# Patient Record
Sex: Male | Born: 1985 | Race: White | Hispanic: No | Marital: Single | Smoking: Former smoker
Health system: Southern US, Community
[De-identification: ages and names within clinical notes are randomized; demographics above are authoritative.]

## PROBLEM LIST (undated history)

## (undated) DIAGNOSIS — J45909 Unspecified asthma, uncomplicated: Secondary | ICD-10-CM

## (undated) DIAGNOSIS — F32A Depression, unspecified: Secondary | ICD-10-CM

## (undated) DIAGNOSIS — F319 Bipolar disorder, unspecified: Secondary | ICD-10-CM

## (undated) DIAGNOSIS — F419 Anxiety disorder, unspecified: Secondary | ICD-10-CM

---

## 2018-10-23 ENCOUNTER — Other Ambulatory Visit: Payer: Self-pay

## 2018-10-23 DIAGNOSIS — Z20822 Contact with and (suspected) exposure to covid-19: Secondary | ICD-10-CM

## 2018-10-24 LAB — NOVEL CORONAVIRUS, NAA: SARS-CoV-2, NAA: NOT DETECTED

## 2019-10-05 ENCOUNTER — Emergency Department (HOSPITAL_COMMUNITY)
Admission: EM | Admit: 2019-10-05 | Discharge: 2019-10-06 | Disposition: A | Discharge status: Home / Self Care | Payer: Self-pay | Attending: Emergency Medicine | Admitting: Emergency Medicine

## 2019-10-05 ENCOUNTER — Other Ambulatory Visit: Payer: Self-pay

## 2019-10-05 DIAGNOSIS — R000 Tachycardia, unspecified: Secondary | ICD-10-CM | POA: Insufficient documentation

## 2019-10-05 DIAGNOSIS — F1510 Other stimulant abuse, uncomplicated: Secondary | ICD-10-CM | POA: Insufficient documentation

## 2019-10-05 DIAGNOSIS — F19988 Other psychoactive substance use, unspecified with other psychoactive substance-induced disorder: Secondary | ICD-10-CM

## 2019-10-05 LAB — CBC WITH DIFFERENTIAL/PLATELET
Abs Immature Granulocytes: 0.11 10*3/uL — ABNORMAL HIGH (ref 0.00–0.07)
Basophils Absolute: 0 10*3/uL (ref 0.0–0.1)
Basophils Relative: 0 %
Eosinophils Absolute: 0 10*3/uL (ref 0.0–0.5)
Eosinophils Relative: 0 %
HCT: 36.4 % — ABNORMAL LOW (ref 39.0–52.0)
Hemoglobin: 12.7 g/dL — ABNORMAL LOW (ref 13.0–17.0)
Immature Granulocytes: 1 %
Lymphocytes Relative: 6 %
Lymphs Abs: 1.1 10*3/uL (ref 0.7–4.0)
MCH: 30.2 pg (ref 26.0–34.0)
MCHC: 34.9 g/dL (ref 30.0–36.0)
MCV: 86.5 fL (ref 80.0–100.0)
Monocytes Absolute: 1.3 10*3/uL — ABNORMAL HIGH (ref 0.1–1.0)
Monocytes Relative: 8 %
Neutro Abs: 14.5 10*3/uL — ABNORMAL HIGH (ref 1.7–7.7)
Neutrophils Relative %: 85 %
Platelets: 269 10*3/uL (ref 150–400)
RBC: 4.21 MIL/uL — ABNORMAL LOW (ref 4.22–5.81)
RDW: 12.5 % (ref 11.5–15.5)
WBC: 17.1 10*3/uL — ABNORMAL HIGH (ref 4.0–10.5)
nRBC: 0 % (ref 0.0–0.2)

## 2019-10-05 LAB — URINALYSIS, ROUTINE W REFLEX MICROSCOPIC
Bilirubin Urine: NEGATIVE
Glucose, UA: NEGATIVE mg/dL
Hgb urine dipstick: NEGATIVE
Ketones, ur: 80 mg/dL — AB
Leukocytes,Ua: NEGATIVE
Nitrite: NEGATIVE
Protein, ur: NEGATIVE mg/dL
Specific Gravity, Urine: 1.009 (ref 1.005–1.030)
pH: 6 (ref 5.0–8.0)

## 2019-10-05 LAB — COMPREHENSIVE METABOLIC PANEL
ALT: 29 U/L (ref 0–44)
AST: 25 U/L (ref 15–41)
Albumin: 4.2 g/dL (ref 3.5–5.0)
Alkaline Phosphatase: 66 U/L (ref 38–126)
Anion gap: 11 (ref 5–15)
BUN: 8 mg/dL (ref 6–20)
CO2: 24 mmol/L (ref 22–32)
Calcium: 8.4 mg/dL — ABNORMAL LOW (ref 8.9–10.3)
Chloride: 102 mmol/L (ref 98–111)
Creatinine, Ser: 0.73 mg/dL (ref 0.61–1.24)
GFR calc Af Amer: >60 mL/min (ref >60)
GFR calc non Af Amer: >60 mL/min (ref >60)
Glucose, Bld: 103 mg/dL — ABNORMAL HIGH (ref 70–99)
Potassium: 3.7 mmol/L (ref 3.5–5.1)
Sodium: 137 mmol/L (ref 135–145)
Total Bilirubin: 1.4 mg/dL — ABNORMAL HIGH (ref 0.3–1.2)
Total Protein: 7 g/dL (ref 6.5–8.1)

## 2019-10-05 LAB — RAPID URINE DRUG SCREEN, HOSP PERFORMED
Amphetamines: POSITIVE — AB
Barbiturates: NOT DETECTED
Benzodiazepines: NOT DETECTED
Cocaine: NOT DETECTED
Opiates: NOT DETECTED
Tetrahydrocannabinol: NOT DETECTED

## 2019-10-05 LAB — ACETAMINOPHEN LEVEL: Acetaminophen (Tylenol), Serum: <10 ug/mL — ABNORMAL LOW (ref 10–30)

## 2019-10-05 LAB — SALICYLATE LEVEL: Salicylate Lvl: <7 mg/dL — ABNORMAL LOW (ref 7.0–30.0)

## 2019-10-05 LAB — CBG MONITORING, ED: Glucose-Capillary: 100 mg/dL — ABNORMAL HIGH (ref 70–99)

## 2019-10-05 LAB — ETHANOL: Alcohol, Ethyl (B): <10 mg/dL (ref <10)

## 2019-10-05 MED ORDER — SODIUM CHLORIDE 0.9 % IV BOLUS
1000.0000 mL | Freq: Once | INTRAVENOUS | Status: AC
  Administered 2019-10-05: 1000 mL via INTRAVENOUS

## 2019-10-05 MED ORDER — ALBUTEROL SULFATE HFA 108 (90 BASE) MCG/ACT IN AERS
2.0000 | INHALATION_SPRAY | Freq: Once | RESPIRATORY_TRACT | Status: AC
  Administered 2019-10-05: 2 via RESPIRATORY_TRACT
  Filled 2019-10-05: qty 6.7

## 2019-10-05 MED ORDER — SODIUM CHLORIDE 0.9 % IV SOLN: INTRAVENOUS | Status: DC

## 2019-10-05 MED ORDER — AEROCHAMBER PLUS FLO-VU MEDIUM MISC
1.0000 | Freq: Once | Status: AC
  Administered 2019-10-05: 1
  Filled 2019-10-05: qty 1

## 2019-10-05 NOTE — ED Provider Notes (Signed)
[facility]-EMERGENCY DEPT Provider Note   CSN: [id number] Arrival date & time: 10/05/19  1156     History Chief Complaint  Patient presents with  . Drug Overdose    Brett House is a 34 y.o. male.  Pt presents to the ED today with a drug overdose.  Pt unable to give any hx.  Per EMS, pt had a 5 day binge of meth.  It is unclear if he called EMS or if someone else did.  He was apparently very agitated and erratic.  EMS had to given him 5 mg Haldol IM for transport.  He is asleep now and is unable to give any hx.        No past medical history on file.  There are no problems to display for this patient.    No family history on file.  Social History   Tobacco Use  . Smoking status: Not on file  Substance Use Topics  . Alcohol use: Not on file  . Drug use: Not on file   + tob/+meth  Home Medications Prior to Admission medications   Not on File    Allergies    Patient has no allergy information on record.  Review of Systems   Review of Systems  Unable to perform ROS: Mental status change    Physical Exam Updated Vital Signs BP (!) 147/82   Pulse 80   Temp 97.7 F (36.5 C)   Resp 18   SpO2 97%   Physical Exam Vitals and nursing note reviewed.  Constitutional:      Appearance: Normal appearance.  HENT:     Head: Normocephalic and atraumatic.     Right Ear: External ear normal.     Left Ear: External ear normal.     Nose: Nose normal.     Mouth/Throat:     Mouth: Mucous membranes are dry.  Eyes:     Extraocular Movements: Extraocular movements intact.     Conjunctiva/sclera: Conjunctivae normal.     Pupils: Pupils are equal, round, and reactive to light.  Cardiovascular:     Rate and Rhythm: Regular rhythm. Tachycardia present.     Pulses: Normal pulses.     Heart sounds: Normal heart sounds.  Pulmonary:     Effort: Pulmonary effort is normal.     Breath sounds: Normal breath sounds.  Abdominal:     General: Abdomen is  flat. Bowel sounds are normal.     Palpations: Abdomen is soft.  Musculoskeletal:        General: Normal range of motion.     Cervical back: Normal range of motion and neck supple.  Skin:    General: Skin is warm.     Capillary Refill: Capillary refill takes less than 2 seconds.  Neurological:     Mental Status: He is lethargic.  Psychiatric:     Comments: Pt is asleep now s/p Haldol.     ED Results / Procedures / Treatments   Labs (all labs ordered are listed, but only abnormal results are displayed) Labs Reviewed  COMPREHENSIVE METABOLIC PANEL - Abnormal; Notable for the following components:      Result Value   Glucose, Bld 103 (*)    Calcium 8.4 (*)    Total Bilirubin 1.4 (*)    All other components within normal limits  SALICYLATE LEVEL - Abnormal; Notable for the following components:   Salicylate Lvl <7.0 (*)    All other components within normal limits  ACETAMINOPHEN  LEVEL - Abnormal; Notable for the following components:   Acetaminophen (Tylenol), Serum <10 (*)    All other components within normal limits  RAPID URINE DRUG SCREEN, HOSP PERFORMED - Abnormal; Notable for the following components:   Amphetamines POSITIVE (*)    All other components within normal limits  CBC WITH DIFFERENTIAL/PLATELET - Abnormal; Notable for the following components:   WBC 17.1 (*)    RBC 4.21 (*)    Hemoglobin 12.7 (*)    HCT 36.4 (*)    Neutro Abs 14.5 (*)    Monocytes Absolute 1.3 (*)    Abs Immature Granulocytes 0.11 (*)    All other components within normal limits  URINALYSIS, ROUTINE W REFLEX MICROSCOPIC - Abnormal; Notable for the following components:   Ketones, ur 80 (*)    All other components within normal limits  CBG MONITORING, ED - Abnormal; Notable for the following components:   Glucose-Capillary 100 (*)    All other components within normal limits  ETHANOL    EKG EKG Interpretation  Date/Time:  Wednesday October 05 2019 15:50:52 EDT Ventricular Rate:   97 PR Interval:  138 QRS Duration: 84 QT Interval:  364 QTC Calculation: 462 R Axis:   49 Text Interpretation: Normal sinus rhythm Nonspecific ST abnormality Abnormal ECG No old tracing to compare Confirmed by Jacalyn Lefevre [phone]) on 10/05/2019 4:24:13 PM   Radiology No results found.  Procedures Procedures (including critical care time)  Medications Ordered in ED Medications  sodium chloride 0.9 % bolus 1,000 mL (0 mLs Intravenous Stopped 10/05/19 1759)    And  0.9 %  sodium chloride infusion (has no administration in time range)  albuterol (VENTOLIN HFA) 108 (90 Base) MCG/ACT inhaler 2 puff (2 puffs Inhalation Given 10/05/19 2053)  AeroChamber Plus Flo-Vu Medium MISC 1 each (1 each Other Given 10/05/19 2053)    ED Course  I have reviewed the triage vital signs and the nursing notes.  Pertinent labs & imaging results that were available during my care of the patient were reviewed by me and considered in my medical decision making (see chart for details).    MDM Rules/Calculators/A&P                          Pt has been sleeping all shift.  He will wake up briefly, but then will go back to sleep.  He requested an albuterol inhaler, so was given one + spacer in the ED.    When pt wakes up and if he is at his normal mental status, he can go home.   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

## 2019-10-05 NOTE — ED Notes (Signed)
Pt asleep, easily visualized from RN station.

## 2019-10-05 NOTE — ED Triage Notes (Signed)
Pt BIBA from home-  Per EMS- 5 day binge of methamphetamines, denies other drug/ alcohol use.  EMS called out for overdose, found pt highly animated, rapid speech, erratic behavior, paranoid.   EMS Gave 5 mg haldol IM  Initial HR of 160--> 110 CBG 114 Temporal 98.6 140/90

## 2019-10-05 NOTE — ED Notes (Signed)
Call received from pt sister Gearldine Bienenstock [email or web address]@ [phone] requesting rtn call for pt status/updates when possible. [employer]

## 2019-12-21 ENCOUNTER — Encounter (HOSPITAL_COMMUNITY): Payer: Self-pay

## 2019-12-21 ENCOUNTER — Emergency Department (HOSPITAL_COMMUNITY)
Admission: EM | Admit: 2019-12-21 | Discharge: 2019-12-21 | Disposition: A | Discharge status: Home / Self Care | Payer: Self-pay | Attending: Emergency Medicine | Admitting: Emergency Medicine

## 2019-12-21 ENCOUNTER — Other Ambulatory Visit: Payer: Self-pay

## 2019-12-21 DIAGNOSIS — R112 Nausea with vomiting, unspecified: Secondary | ICD-10-CM | POA: Insufficient documentation

## 2019-12-21 LAB — URINALYSIS, ROUTINE W REFLEX MICROSCOPIC
Bacteria, UA: NONE SEEN
Bilirubin Urine: NEGATIVE
Glucose, UA: NEGATIVE mg/dL
Ketones, ur: NEGATIVE mg/dL
Leukocytes,Ua: NEGATIVE
Nitrite: NEGATIVE
Protein, ur: NEGATIVE mg/dL
Specific Gravity, Urine: 1.019 (ref 1.005–1.030)
pH: 5 (ref 5.0–8.0)

## 2019-12-21 LAB — CBC
HCT: 43.6 % (ref 39.0–52.0)
Hemoglobin: 15 g/dL (ref 13.0–17.0)
MCH: 30.2 pg (ref 26.0–34.0)
MCHC: 34.4 g/dL (ref 30.0–36.0)
MCV: 87.9 fL (ref 80.0–100.0)
Platelets: 342 10*3/uL (ref 150–400)
RBC: 4.96 MIL/uL (ref 4.22–5.81)
RDW: 12.8 % (ref 11.5–15.5)
WBC: 9.6 10*3/uL (ref 4.0–10.5)
nRBC: 0 % (ref 0.0–0.2)

## 2019-12-21 LAB — COMPREHENSIVE METABOLIC PANEL
ALT: 24 U/L (ref 0–44)
AST: 20 U/L (ref 15–41)
Albumin: 4.6 g/dL (ref 3.5–5.0)
Alkaline Phosphatase: 54 U/L (ref 38–126)
Anion gap: 15 (ref 5–15)
BUN: 9 mg/dL (ref 6–20)
CO2: 20 mmol/L — ABNORMAL LOW (ref 22–32)
Calcium: 9.1 mg/dL (ref 8.9–10.3)
Chloride: 105 mmol/L (ref 98–111)
Creatinine, Ser: 0.8 mg/dL (ref 0.61–1.24)
GFR, Estimated: >60 mL/min (ref >60)
Glucose, Bld: 120 mg/dL — ABNORMAL HIGH (ref 70–99)
Potassium: 3.5 mmol/L (ref 3.5–5.1)
Sodium: 140 mmol/L (ref 135–145)
Total Bilirubin: 0.6 mg/dL (ref 0.3–1.2)
Total Protein: 7.2 g/dL (ref 6.5–8.1)

## 2019-12-21 LAB — LIPASE, BLOOD: Lipase: 29 U/L (ref 11–51)

## 2019-12-21 MED ORDER — ONDANSETRON 4 MG PO TBDP
ORAL_TABLET | ORAL | 0 refills | Status: DC
Start: 2019-12-21 — End: 2020-01-24

## 2019-12-21 MED ORDER — ONDANSETRON 4 MG PO TBDP
4.0000 mg | ORAL_TABLET | Freq: Once | ORAL | Status: AC
  Administered 2019-12-21: 4 mg via ORAL
  Filled 2019-12-21: qty 1

## 2019-12-21 NOTE — ED Provider Notes (Signed)
[facility] EMERGENCY DEPARTMENT Provider Note   CSN: [id number] Arrival date & time: 12/21/19  1545     History Chief Complaint  Patient presents with  . Nausea    Brett House is a 34 y.o. male otherwise healthy here presenting with nausea and vomiting.  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  He denies actually ingesting the chemicals or touching those chemicals.  He states that he woke up this morning and started vomiting.  He had about 10 episodes of vomiting.  Denies any abdominal pain.  Denies any fever.  Denies eating any bad food.  Denies any sick contacts.  The history is provided by the patient.       History reviewed. No pertinent past medical history.  There are no problems to display for this patient.   History reviewed. No pertinent surgical history.     No family history on file.  Social History   Tobacco Use  . Smoking status: Not on file  Substance Use Topics  . Alcohol use: Not on file  . Drug use: Not on file    Home Medications Prior to Admission medications   Not on File    Allergies    Erythromycin  Review of Systems   Review of Systems  Gastrointestinal: Positive for vomiting.  All other systems reviewed and are negative.   Physical Exam Updated Vital Signs BP (!) 149/87 (BP Location: Left Arm)   Pulse 82   Temp 98.1 F (36.7 C) (Oral)   Resp 18   SpO2 100%   Physical Exam Vitals and nursing note reviewed.  Constitutional:      Appearance: Normal appearance.  HENT:     Head: Normocephalic.     Nose: Nose normal.     Mouth/Throat:     Mouth: Mucous membranes are dry.  Eyes:     Extraocular Movements: Extraocular movements intact.     Pupils: Pupils are equal, round, and reactive to light.  Cardiovascular:     Rate and Rhythm: Normal rate and regular rhythm.     Pulses: Normal pulses.     Heart  sounds: Normal heart sounds.  Pulmonary:     Effort: Pulmonary effort is normal.     Breath sounds: Normal breath sounds.  Abdominal:     General: Abdomen is flat.     Palpations: Abdomen is soft.  Musculoskeletal:        General: Normal range of motion.     Cervical back: Normal range of motion and neck supple.  Skin:    General: Skin is warm.     Capillary Refill: Capillary refill takes less than 2 seconds.  Neurological:     General: No focal deficit present.     Mental Status: He is alert and oriented to person, place, and time.  Psychiatric:        Mood and Affect: Mood normal.        Behavior: Behavior normal.     ED Results / Procedures / Treatments   Labs (all labs ordered are listed, but only abnormal results are displayed) Labs Reviewed  COMPREHENSIVE METABOLIC PANEL - Abnormal; Notable for the following components:      Result Value   CO2 20 (*)    Glucose, Bld 120 (*)    All other components within normal limits  URINALYSIS, ROUTINE W REFLEX MICROSCOPIC - Abnormal; Notable for  the following components:   Hgb urine dipstick SMALL (*)    All other components within normal limits  LIPASE, BLOOD  CBC    EKG None  Radiology No results found.  Procedures Procedures (including critical care time)  Medications Ordered in ED Medications  ondansetron (ZOFRAN-ODT) disintegrating tablet 4 mg (has no administration in time range)    ED Course  I have reviewed the triage vital signs and the nursing notes.  Pertinent labs & imaging results that were available during my care of the patient were reviewed by me and considered in my medical decision making (see chart for details).    MDM Rules/Calculators/A&P                         Brett House is a 34 y.o. male here presenting with vomiting.  He did smell some chemicals yesterday but did not ingest any.  Abdomen is soft and nontender.  I think likely viral gastroenteritis.  I offered IV fluids and Zofran but  patient states that he would rather avoid an IV.  We will try ODT Zofran and p.o. trial.  9:26 PM Tolerated p.o. after Zofran.  P.o. after Zofran. Labs showed CO2 20 likely from vomiting. AG is 15 likely from vomiting. Likely viral gastro. Will dc home with zofran.    Final Clinical Impression(s) / ED Diagnoses Final diagnoses:  None    Rx / DC Orders ED Discharge Orders    None       Charlynne Pander, MD 12/21/19 2127

## 2019-12-21 NOTE — ED Notes (Signed)
Pt given beverage for PO trial.

## 2019-12-21 NOTE — ED Triage Notes (Signed)
Pt arrives to ED w/ c/o n/v. 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 Pt denies diarrhea, abdominal pain.

## 2019-12-21 NOTE — Discharge Instructions (Signed)
You likely have a stomach virus.  Take Zofran for nausea.  Stay hydrated.  See your doctor for follow-up  Return to ER if you have severe abdominal pain, uncontrolled vomiting, dehydration, fever.

## 2020-01-24 ENCOUNTER — Other Ambulatory Visit: Payer: Self-pay

## 2020-01-24 ENCOUNTER — Encounter (HOSPITAL_COMMUNITY): Payer: Self-pay

## 2020-01-24 ENCOUNTER — Ambulatory Visit (HOSPITAL_COMMUNITY)
Admission: EM | Admit: 2020-01-24 | Discharge: 2020-01-25 | Disposition: A | Discharge status: Other Acute Inpatient Hospital | Payer: No Payment, Other | Attending: Psychiatry | Admitting: Psychiatry

## 2020-01-24 DIAGNOSIS — F319 Bipolar disorder, unspecified: Secondary | ICD-10-CM | POA: Insufficient documentation

## 2020-01-24 DIAGNOSIS — Z20822 Contact with and (suspected) exposure to covid-19: Secondary | ICD-10-CM | POA: Diagnosis not present

## 2020-01-24 DIAGNOSIS — F3130 Bipolar disorder, current episode depressed, mild or moderate severity, unspecified: Secondary | ICD-10-CM

## 2020-01-24 HISTORY — DX: Anxiety disorder, unspecified: F41.9

## 2020-01-24 HISTORY — DX: Depression, unspecified: F32.A

## 2020-01-24 HISTORY — DX: Unspecified asthma, uncomplicated: J45.909

## 2020-01-24 HISTORY — DX: Bipolar disorder, unspecified: F31.9

## 2020-01-24 LAB — POCT URINE DRUG SCREEN - MANUAL ENTRY (I-SCREEN)
POC Amphetamine UR: NOT DETECTED
POC Buprenorphine (BUP): NOT DETECTED
POC Cocaine UR: POSITIVE — AB
POC Marijuana UR: POSITIVE — AB
POC Methadone UR: NOT DETECTED
POC Methamphetamine UR: NOT DETECTED
POC Morphine: NOT DETECTED
POC Oxazepam (BZO): NOT DETECTED
POC Oxycodone UR: NOT DETECTED
POC Secobarbital (BAR): NOT DETECTED

## 2020-01-24 LAB — COMPREHENSIVE METABOLIC PANEL
ALT: 26 U/L (ref 0–44)
AST: 38 U/L (ref 15–41)
Albumin: 5 g/dL (ref 3.5–5.0)
Alkaline Phosphatase: 69 U/L (ref 38–126)
Anion gap: 16 — ABNORMAL HIGH (ref 5–15)
BUN: 8 mg/dL (ref 6–20)
CO2: 23 mmol/L (ref 22–32)
Calcium: 9.7 mg/dL (ref 8.9–10.3)
Chloride: 101 mmol/L (ref 98–111)
Creatinine, Ser: 0.8 mg/dL (ref 0.61–1.24)
GFR, Estimated: >60 mL/min (ref >60)
Glucose, Bld: 88 mg/dL (ref 70–99)
Potassium: 3.6 mmol/L (ref 3.5–5.1)
Sodium: 140 mmol/L (ref 135–145)
Total Bilirubin: 0.8 mg/dL (ref 0.3–1.2)
Total Protein: 7.8 g/dL (ref 6.5–8.1)

## 2020-01-24 LAB — CBC WITH DIFFERENTIAL/PLATELET
Abs Immature Granulocytes: 0.08 10*3/uL — ABNORMAL HIGH (ref 0.00–0.07)
Basophils Absolute: 0.1 10*3/uL (ref 0.0–0.1)
Basophils Relative: 0 %
Eosinophils Absolute: 0 10*3/uL (ref 0.0–0.5)
Eosinophils Relative: 0 %
HCT: 46.1 % (ref 39.0–52.0)
Hemoglobin: 15.9 g/dL (ref 13.0–17.0)
Immature Granulocytes: 1 %
Lymphocytes Relative: 11 %
Lymphs Abs: 1.8 10*3/uL (ref 0.7–4.0)
MCH: 29.7 pg (ref 26.0–34.0)
MCHC: 34.5 g/dL (ref 30.0–36.0)
MCV: 86.2 fL (ref 80.0–100.0)
Monocytes Absolute: 1.2 10*3/uL — ABNORMAL HIGH (ref 0.1–1.0)
Monocytes Relative: 7 %
Neutro Abs: 13.6 10*3/uL — ABNORMAL HIGH (ref 1.7–7.7)
Neutrophils Relative %: 81 %
Platelets: 401 10*3/uL — ABNORMAL HIGH (ref 150–400)
RBC: 5.35 MIL/uL (ref 4.22–5.81)
RDW: 13.2 % (ref 11.5–15.5)
WBC: 16.8 10*3/uL — ABNORMAL HIGH (ref 4.0–10.5)
nRBC: 0 % (ref 0.0–0.2)

## 2020-01-24 LAB — TSH: TSH: 0.612 u[IU]/mL (ref 0.350–4.500)

## 2020-01-24 LAB — RESP PANEL BY RT-PCR (FLU A&B, COVID) ARPGX2
Influenza A by PCR: NEGATIVE
Influenza B by PCR: NEGATIVE
SARS Coronavirus 2 by RT PCR: NEGATIVE

## 2020-01-24 LAB — LIPID PANEL
Cholesterol: 217 mg/dL — ABNORMAL HIGH (ref 0–200)
HDL: 81 mg/dL (ref >40)
LDL Cholesterol: 120 mg/dL — ABNORMAL HIGH (ref 0–99)
Total CHOL/HDL Ratio: 2.7 RATIO
Triglycerides: 79 mg/dL (ref <150)
VLDL: 16 mg/dL (ref 0–40)

## 2020-01-24 LAB — HEMOGLOBIN A1C
Hgb A1c MFr Bld: 5.2 % (ref 4.8–5.6)
Mean Plasma Glucose: 102.54 mg/dL

## 2020-01-24 LAB — POC SARS CORONAVIRUS 2 AG -  ED: SARS Coronavirus 2 Ag: NEGATIVE

## 2020-01-24 LAB — POC SARS CORONAVIRUS 2 AG: SARS Coronavirus 2 Ag: NEGATIVE

## 2020-01-24 MED ORDER — ALUM & MAG HYDROXIDE-SIMETH 200-200-20 MG/5ML PO SUSP: 30.0000 mL | ORAL | Status: DC | PRN

## 2020-01-24 MED ORDER — MAGNESIUM HYDROXIDE 400 MG/5ML PO SUSP: 30.0000 mL | Freq: Every day | ORAL | Status: DC | PRN

## 2020-01-24 MED ORDER — QUETIAPINE FUMARATE 50 MG PO TABS: 50.0000 mg | ORAL_TABLET | Freq: Every day | ORAL | Status: DC

## 2020-01-24 MED ORDER — LITHIUM CARBONATE ER 300 MG PO TBCR
300.0000 mg | EXTENDED_RELEASE_TABLET | Freq: Two times a day (BID) | ORAL | Status: DC
  Administered 2020-01-24 (×2): 300 mg via ORAL
  Filled 2020-01-24 (×2): qty 1

## 2020-01-24 MED ORDER — NICOTINE 21 MG/24HR TD PT24
21.0000 mg | MEDICATED_PATCH | Freq: Every day | TRANSDERMAL | Status: DC
  Administered 2020-01-24: 21 mg via TRANSDERMAL
  Filled 2020-01-24: qty 1

## 2020-01-24 MED ORDER — ACETAMINOPHEN 325 MG PO TABS: 650.0000 mg | ORAL_TABLET | Freq: Four times a day (QID) | ORAL | Status: DC | PRN

## 2020-01-24 NOTE — ED Notes (Signed)
Blood drawn from pt left arm ac using 23g needle. No issues noted. Pt tolerated well. Pressure bandage applied. Will continue to monitor for safety. STAT lab pickup called

## 2020-01-24 NOTE — ED Notes (Signed)
Pt given bathing supplies upon request

## 2020-01-24 NOTE — Progress Notes (Signed)
Received Brett House to the [facility] with a chief compliant of suicide with the plan to walk  Into traffic. His girlfriend broke off the relationship with him and he loss his job yesterday. He stated feeling suicidal in the past with an intent to hang himself or walk into traffic. He has a past history of bipolar and have been noncompliant with his medication, Lithium, for over a yeat.

## 2020-01-24 NOTE — ED Notes (Signed)
Meal given: sandwich and cola
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
## 2020-01-24 NOTE — ED Notes (Signed)
Pt ambulated per self on unit. A&O x4. SI with plan. No HI or AVH. Contracts for safety while here. Oriented to staff and unit. Will continue to monitor for safety

## 2020-01-24 NOTE — ED Notes (Signed)
Report called to [facility] RN @ [facility]. Plan to send patient about [phone].

## 2020-01-24 NOTE — ED Notes (Signed)
Meal given: roast beef sandwich and chips given

## 2020-01-24 NOTE — ED Notes (Signed)
[facility] [employer] called for ride to [facility]. Per driver Alona Bene she is at [facility] and will be here ASAP.

## 2020-01-24 NOTE — ED Notes (Signed)
LOCKER [phone] 

## 2020-01-24 NOTE — ED Notes (Addendum)
Per Selena Batten [facility] @ [facility] transfer of this patient has been delayed due to multiple admissions arriving via [employer]. [facility] will notify [facility] when patient can be transferred.

## 2020-01-24 NOTE — ED Notes (Signed)
Sandwich given 
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
## 2020-01-25 ENCOUNTER — Inpatient Hospital Stay (HOSPITAL_COMMUNITY)
Admission: AD | Admit: 2020-01-25 | Discharge: 2020-01-27 | DRG: 885 | Disposition: A | Discharge status: Home / Self Care | Payer: Federal, State, Local not specified - Other | Source: Intra-hospital | Attending: Psychiatry | Admitting: Psychiatry

## 2020-01-25 ENCOUNTER — Encounter (HOSPITAL_COMMUNITY): Payer: Self-pay | Admitting: Psychiatry

## 2020-01-25 ENCOUNTER — Inpatient Hospital Stay (HOSPITAL_COMMUNITY)
Admission: AD | Admit: 2020-01-25 | Payer: Federal, State, Local not specified - Other | Source: Other Acute Inpatient Hospital

## 2020-01-25 DIAGNOSIS — F319 Bipolar disorder, unspecified: Secondary | ICD-10-CM | POA: Diagnosis present

## 2020-01-25 DIAGNOSIS — F419 Anxiety disorder, unspecified: Secondary | ICD-10-CM | POA: Diagnosis present

## 2020-01-25 DIAGNOSIS — F17210 Nicotine dependence, cigarettes, uncomplicated: Secondary | ICD-10-CM | POA: Diagnosis present

## 2020-01-25 DIAGNOSIS — F1210 Cannabis abuse, uncomplicated: Secondary | ICD-10-CM | POA: Diagnosis present

## 2020-01-25 DIAGNOSIS — F32A Depression, unspecified: Secondary | ICD-10-CM | POA: Diagnosis present

## 2020-01-25 DIAGNOSIS — J45909 Unspecified asthma, uncomplicated: Secondary | ICD-10-CM | POA: Diagnosis present

## 2020-01-25 DIAGNOSIS — Z9151 Personal history of suicidal behavior: Secondary | ICD-10-CM

## 2020-01-25 DIAGNOSIS — Z9114 Patient's other noncompliance with medication regimen: Secondary | ICD-10-CM

## 2020-01-25 DIAGNOSIS — Z881 Allergy status to other antibiotic agents status: Secondary | ICD-10-CM | POA: Diagnosis not present

## 2020-01-25 DIAGNOSIS — F3130 Bipolar disorder, current episode depressed, mild or moderate severity, unspecified: Principal | ICD-10-CM | POA: Diagnosis present

## 2020-01-25 DIAGNOSIS — R45851 Suicidal ideations: Secondary | ICD-10-CM | POA: Diagnosis present

## 2020-01-25 DIAGNOSIS — F1410 Cocaine abuse, uncomplicated: Secondary | ICD-10-CM | POA: Diagnosis present

## 2020-01-25 MED ORDER — ALUM & MAG HYDROXIDE-SIMETH 200-200-20 MG/5ML PO SUSP: 30.0000 mL | ORAL | Status: DC | PRN

## 2020-01-25 MED ORDER — QUETIAPINE FUMARATE 100 MG PO TABS
100.0000 mg | ORAL_TABLET | Freq: Every day | ORAL | Status: DC
  Administered 2020-01-25 – 2020-01-26 (×2): 100 mg via ORAL
  Filled 2020-01-25 (×4): qty 1

## 2020-01-25 MED ORDER — LORAZEPAM 1 MG PO TABS
1.0000 mg | ORAL_TABLET | Freq: Four times a day (QID) | ORAL | Status: DC | PRN
  Administered 2020-01-25 – 2020-01-27 (×6): 1 mg via ORAL
  Filled 2020-01-25 (×6): qty 1

## 2020-01-25 MED ORDER — QUETIAPINE FUMARATE 50 MG PO TABS
50.0000 mg | ORAL_TABLET | Freq: Every day | ORAL | Status: DC
  Administered 2020-01-25: 50 mg via ORAL
  Filled 2020-01-25 (×4): qty 1

## 2020-01-25 MED ORDER — LITHIUM CARBONATE ER 300 MG PO TBCR
300.0000 mg | EXTENDED_RELEASE_TABLET | Freq: Two times a day (BID) | ORAL | Status: DC
  Administered 2020-01-25 – 2020-01-27 (×5): 300 mg via ORAL
  Filled 2020-01-25 (×9): qty 1

## 2020-01-25 MED ORDER — NICOTINE 21 MG/24HR TD PT24
21.0000 mg | MEDICATED_PATCH | Freq: Every day | TRANSDERMAL | Status: DC
  Administered 2020-01-25 – 2020-01-27 (×3): 21 mg via TRANSDERMAL
  Filled 2020-01-25 (×8): qty 1

## 2020-01-25 MED ORDER — ACETAMINOPHEN 325 MG PO TABS: 650.0000 mg | ORAL_TABLET | Freq: Four times a day (QID) | ORAL | Status: DC | PRN

## 2020-01-25 MED ORDER — MAGNESIUM HYDROXIDE 400 MG/5ML PO SUSP: 30.0000 mL | Freq: Every day | ORAL | Status: DC | PRN

## 2020-01-25 NOTE — [facility] Group Notes (Signed)
[facility] LCSW Group Therapy  01/25/2020 2:19 PM  Type of Therapy:  Group Therapy  Participation Level:  Did Not Attend  Summary of Progress/Problems: Group was focused on Psychoeducation on CBT therapy, specifically, cognitive distortions. Pts were given handouts about cognitive distortions, behavioral activation plans, and a list of activities.  Pt did not attend or take handout.  Felizardo Hoffmann 01/25/2020, 2:19 PM
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
## 2020-01-25 NOTE — ED Notes (Signed)
Patient escorted to [facility] [employer] to [employer]. No questions regarding transfer to [facility] for IP treatment. Patient in no acute distress @ time of discharge from [facility]

## 2020-01-25 NOTE — Tx Team (Signed)
Initial Treatment Plan 01/25/2020 5:04 AM Brett House VBT:[id number]    PATIENT STRESSORS: Financial difficulties Marital or family conflict Occupational concerns   PATIENT STRENGTHS: Average or above average intelligence Capable of independent living Motivation for treatment/growth   PATIENT IDENTIFIED PROBLEMS: Suicidal ideation - plan to step into traffic  Anxiety  Depression  ("pt wants to work on feeling better and finding alternative housing for himself.               DISCHARGE CRITERIA:  Adequate post-discharge living arrangements Motivation to continue treatment in a less acute level of care Verbal commitment to aftercare and medication compliance  PRELIMINARY DISCHARGE PLAN: Attend aftercare/continuing care group Outpatient therapy Placement in alternative living arrangements  PATIENT/FAMILY INVOLVEMENT: This treatment plan has been presented to and reviewed with the patient, Brett House, and/or family member.  The patient and family have been given the opportunity to ask questions and make suggestions.  Victorino December, RN 01/25/2020, 5:04 AM

## 2020-01-25 NOTE — Progress Notes (Signed)
   01/25/20 2108  Psych Admission Type (Psych Patients Only)  Admission Status Voluntary  Psychosocial Assessment  Patient Complaints Anxiety;Depression  Eye Contact Fair  Facial Expression Anxious  Affect Anxious;Depressed  Speech Logical/coherent  Interaction Minimal  Motor Activity Other (Comment) (WDL)  Appearance/Hygiene Unremarkable  Behavior Characteristics Cooperative;Appropriate to situation  Mood Depressed;Anxious  Thought Process  Coherency WDL  Content WDL  Delusions None reported or observed  Perception WDL  Hallucination None reported or observed  Judgment UTA  Confusion None  Danger to Self  Current suicidal ideation? Denies  Danger to Others  Danger to Others None reported or observed  D: Patient presents with anxious and depressed affect. Patient reports he is anxious but he is feeling a little better than he was. Patient denies SI/HI at this time. Patient also denies AH/VH at this time. Patient contracts for safety.  A: Provided positive reinforcement and encouragement.  R: Patient cooperative and receptive to efforts. Patient remains safe on the unit.
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
## 2020-01-25 NOTE — [facility] Suicide Risk Assessment (Signed)
[facility] INPATIENT:  Family/Significant Other Suicide Prevention Education  Suicide Prevention Education:  Patient Refusal for Family/Significant Other Suicide Prevention Education: The patient Brett House has refused to provide written consent for family/significant other to be provided Family/Significant Other Suicide Prevention Education during admission and/or prior to discharge. Physician notified.  SPE completed with patient, as patient refused to consent to family contact. SPI pamphlet provided to pt and pt was encouraged to share information with support network, ask questions, and talk about any concerns relating to SPE. Patient denies access to guns/firearms and verbalized understanding of information provided. Mobile Crisis information also provided to patient.  Fredirick Lathe, LCSWA Clinicial Social Worker [employer]

## 2020-01-25 NOTE — Progress Notes (Signed)
Recreation Therapy Notes  Date:  11.24.21 Time: 0930 Location: 300 [address] Dayroom  Group Topic: Stress Management  Goal Area(s) Addresses:  Patient will identify positive stress management techniques. Patient will identify benefits of using stress management post d/c.  Intervention: Stress Management  Activity: Meditation.  LRT played a meditation that focused on finding happiness in the present moment.  Patients were to listen and follow along as meditation played in order to fully engage in activity.    Education:  Stress Management, Discharge Planning.   Education Outcome: Acknowledges Education  Clinical Observations/Feedback:  Pt did not attend group activity.    Caroll Rancher, LRT/CTRS        Caroll Rancher A 01/25/2020 11:53 AM

## 2020-01-25 NOTE — ED Notes (Signed)
Per Garnet RN @ [facility] they are ready to accept patient. [facility] called.

## 2020-01-25 NOTE — Tx Team (Signed)
Interdisciplinary Treatment and Diagnostic Plan Update  01/25/2020 Time of Session: 9:05am  Brett House MRN: [id number]  Principal Diagnosis: <principal problem not specified>  Secondary Diagnoses: Active Problems:   Bipolar I disorder, current episode depressed ([place])   Current Medications:  Current Facility-Administered Medications  Medication Dose Route Frequency Provider Last Rate Last Admin  . acetaminophen (TYLENOL) tablet 650 mg  650 mg Oral Q6H PRN Money, Lowry Ram, FNP      . alum & mag hydroxide-simeth (MAALOX/MYLANTA) 200-200-20 MG/5ML suspension 30 mL  30 mL Oral Q4H PRN Money, Darnelle Maffucci B, FNP      . lithium carbonate (LITHOBID) CR tablet 300 mg  300 mg Oral Q12H Money, Travis B, FNP      . magnesium hydroxide (MILK OF MAGNESIA) suspension 30 mL  30 mL Oral Daily PRN Money, Darnelle Maffucci B, FNP      . nicotine (NICODERM CQ - dosed in mg/24 hours) patch 21 mg  21 mg Transdermal Q0600 Money, Lowry Ram, FNP      . QUEtiapine (SEROQUEL) tablet 50 mg  50 mg Oral QHS Money, Lowry Ram, FNP   50 mg at 01/25/20 0125   PTA Medications: Medications Prior to Admission  Medication Sig Dispense Refill Last Dose  . lithium 300 MG tablet Take 300 mg by mouth every morning. Stop taking the medication one year ago (Patient not taking: Reported on 01/24/2020)       Patient Stressors: Financial difficulties Marital or family conflict Occupational concerns  Patient Strengths: Average or above average intelligence Capable of independent living Motivation for treatment/growth  Treatment Modalities: Medication Management, Group therapy, Case management,  1 to 1 session with clinician, Psychoeducation, Recreational therapy.   Physician Treatment Plan for Primary Diagnosis: <principal problem not specified> Long Term Goal(s):     Short Term Goals:    Medication Management: Evaluate patient's response, side effects, and tolerance of medication regimen.  Therapeutic Interventions: 1 to 1 sessions,  Unit Group sessions and Medication administration.  Evaluation of Outcomes: Not Met  Physician Treatment Plan for Secondary Diagnosis: Active Problems:   Bipolar I disorder, current episode depressed ([place])  Long Term Goal(s):     Short Term Goals:       Medication Management: Evaluate patient's response, side effects, and tolerance of medication regimen.  Therapeutic Interventions: 1 to 1 sessions, Unit Group sessions and Medication administration.  Evaluation of Outcomes: Not Met   RN Treatment Plan for Primary Diagnosis: <principal problem not specified> Long Term Goal(s): Knowledge of disease and therapeutic regimen to maintain health will improve  Short Term Goals: Ability to remain free from injury will improve, Ability to participate in decision making will improve, Ability to verbalize feelings will improve, Ability to disclose and discuss suicidal ideas and Ability to identify and develop effective coping behaviors will improve  Medication Management: RN will administer medications as ordered by provider, will assess and evaluate patient's response and provide education to patient for prescribed medication. RN will report any adverse and/or side effects to prescribing provider.  Therapeutic Interventions: 1 on 1 counseling sessions, Psychoeducation, Medication administration, Evaluate responses to treatment, Monitor vital signs and CBGs as ordered, Perform/monitor CIWA, COWS, AIMS and Fall Risk screenings as ordered, Perform wound care treatments as ordered.  Evaluation of Outcomes: Not Met   LCSW Treatment Plan for Primary Diagnosis: <principal problem not specified> Long Term Goal(s): Safe transition to appropriate next level of care at discharge, Engage patient in therapeutic group addressing interpersonal concerns.  Short Term Goals: Engage  patient in aftercare planning with referrals and resources, Increase social support, Increase emotional regulation, Facilitate  acceptance of mental health diagnosis and concerns, Identify triggers associated with mental health/substance abuse issues and Increase skills for wellness and recovery  Therapeutic Interventions: Assess for all discharge needs, 1 to 1 time with Social worker, Explore available resources and support systems, Assess for adequacy in community support network, Educate family and significant other(s) on suicide prevention, Complete Psychosocial Assessment, Interpersonal group therapy.  Evaluation of Outcomes: Not Met   Progress in Treatment: Attending groups: No. Participating in groups: No. Taking medication as prescribed: Yes. Toleration medication: Yes. Family/Significant other contact made: No, will contact:  Declined consents  Patient understands diagnosis: Yes. Discussing patient identified problems/goals with staff: Yes. Medical problems stabilized or resolved: Yes. Denies suicidal/homicidal ideation: Yes. Issues/concerns per patient self-inventory: No.   New problem(s) identified: No, Describe:  None   New Short Term/Long Term Goal(s): medication stabilization, elimination of SI thoughts, development of comprehensive mental wellness plan.   Patient Goals:  "To take my medications and to get back on track"   Discharge Plan or Barriers: Patient recently admitted. CSW will continue to follow and assess for appropriate referrals and possible discharge planning.   Reason for Continuation of Hospitalization: Depression Medication stabilization Suicidal ideation  Estimated Length of Stay: 3 to 5 days  Attendees: Patient: Brett House  01/25/2020   Physician: Lala Lund, MD 01/25/2020   Nursing:  01/25/2020   RN Care Manager: 01/25/2020   Social Worker: [name], [place] 01/25/2020   Recreational Therapist:  01/25/2020   Other:  01/25/2020   Other:  01/25/2020   Other: 01/25/2020     Scribe for Treatment Team: Darleen Crocker, LCSWA 01/25/2020 10:12 AM

## 2020-01-25 NOTE — Progress Notes (Addendum)
   01/25/20 0214  Psych Admission Type (Psych Patients Only)  Admission Status Voluntary  Psychosocial Assessment  Patient Complaints Anxiety;Depression  Eye Contact Fair  Facial Expression Anxious  Affect Anxious;Depressed  Speech Logical/coherent  Interaction Other (Comment) (appropriate)  Motor Activity Other (Comment) (wnl)  Appearance/Hygiene Unremarkable  Behavior Characteristics Cooperative;Appropriate to situation  Mood Depressed;Anxious  Thought Process  Coherency WDL  Content WDL  Delusions None reported or observed  Perception WDL  Hallucination None reported or observed  Judgment Impaired  Confusion None  Danger to Self  Current suicidal ideation? Denies  Danger to Others  Danger to Others None reported or observed   Pt endorses anxiety 4/10 and depression 8/10. Pt cooperative and agreeable to treatment.
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
## 2020-01-25 NOTE — [facility] Suicide Risk Assessment (Signed)
[facility] Admission Suicide Risk Assessment   Nursing information obtained from:  Patient Demographic factors:  Caucasian, Male, Unemployed Current Mental Status:  Suicidal ideation indicated by patient Loss Factors:  Loss of significant relationship, Financial problems / change in socioeconomic status Historical Factors:  Prior suicide attempts, Impulsivity Risk Reduction Factors:  Positive coping skills or problem solving skills  Total Time spent with patient: 45 minutes Principal Problem: <principal problem not specified> Diagnosis:  Active Problems:   Bipolar I disorder, current episode depressed (HCC)  Data: 34 year old male with a history of bipolar disorder presents with suicidal ideation in the context of interpersonal relationship difficulties and medication noncompliance.  Continued Clinical Symptoms:  Alcohol Use Disorder Identification Test Final Score (AUDIT): 8 The "Alcohol Use Disorders Identification Test", Guidelines for Use in Primary Care, Second Edition.  World Science writer [facility] Score between 0-7:  no or low risk or alcohol related problems. Score between 8-15:  moderate risk of alcohol related problems. Score between 16-19:  high risk of alcohol related problems. Score 20 or above:  warrants further diagnostic evaluation for alcohol dependence and treatment.   CLINICAL FACTORS:   Bipolar Disorder:   Mixed State   See HPI for full mental status exam.  COGNITIVE FEATURES THAT CONTRIBUTE TO RISK:  None    SUICIDE RISK:   Moderate:  Frequent suicidal ideation with limited intensity, and duration, some specificity in terms of plans, no associated intent, good self-control, limited dysphoria/symptomatology, some risk factors present, and identifiable protective factors, including available and accessible social support.  PLAN OF CARE: Continue care on inpatient basis.  I certify that inpatient services furnished can reasonably be expected to improve the  patient's condition.   Clement Sayres, MD 01/25/2020, 1:21 PM

## 2020-01-26 NOTE — Progress Notes (Signed)
Adult Psychoeducational Group Note  Date:  01/26/2020 Time:  7:27 AM  Group Topic/Focus:  Wrap-Up Group:   The focus of this group is to help patients review their daily goal of treatment and discuss progress on daily workbooks.  Participation Level:  Active  Participation Quality:  Appropriate  Affect:  Appropriate  Cognitive:  Appropriate  Insight: Appropriate  Engagement in Group:  Engaged  Modes of Intervention:  Discussion  Additional Comments:  Pt attend wrap up group. His day way 5 the one positive thing happen he talked to doctor and new meds.  Charna Busman Long 01/26/2020, 7:27 AM

## 2020-01-26 NOTE — Progress Notes (Signed)
[facility] MD Progress Note  01/26/2020 9:45 AM Brett House  MRN:  [id number] Subjective:  Patient remains compliant with his medications. He report that his sleep was "on and off" last night with more issues staying asleep. Patient also reports that he had some diarrhea yesterday, but none overnight. Patient reports that his appetite has been good despite this and that he mood this AM is " ok I guess." Patient contracts for safety. Principal Problem: Bipolar I disorder, current episode depressed ([place]) Diagnosis: Principal Problem:   Bipolar I disorder, current episode depressed ([place])  Total Time spent with patient: 15 minutes  Past Psychiatric History: See H&P  Past Medical History:  Past Medical History:  Diagnosis Date  . Anxiety   . Asthma   . Bipolar disorder ([place])   . Depression    History reviewed. No pertinent surgical history. Family History: History reviewed. No pertinent family history. Family Psychiatric  History: H&P Social History:  Social History   Substance and Sexual Activity  Alcohol Use Yes   Comment: 6 pack once a week for last 8-9 months     Social History   Substance and Sexual Activity  Drug Use Yes  . Types: Marijuana   Comment: rarely    Social History   Socioeconomic History  . Marital status: Single    Spouse name: Not on file  . Number of children: 0  . Years of education: Not on file  . Highest education level: 12th grade  Occupational History  . Occupation: Unemployed  Tobacco Use  . Smoking status: Current Every Day Smoker    Packs/day: 1.50    Years: 15.00    Pack years: 22.50  . Smokeless tobacco: Current User  Vaping Use  . Vaping Use: Never used  Substance and Sexual Activity  . Alcohol use: Yes    Comment: 6 pack once a week for last 8-9 months  . Drug use: Yes    Types: Marijuana    Comment: rarely  . Sexual activity: Yes  Other Topics Concern  . Not on file  Social History Narrative  . Not on file   Social Determinants  of Health   Financial Resource Strain:   . Difficulty of Paying Living Expenses: Not on file  Food Insecurity:   . Worried About Charity fundraiser in the Last Year: Not on file  . Ran Out of Food in the Last Year: Not on file  Transportation Needs:   . Lack of Transportation (Medical): Not on file  . Lack of Transportation (Non-Medical): Not on file  Physical Activity:   . Days of Exercise per Week: Not on file  . Minutes of Exercise per Session: Not on file  Stress:   . Feeling of Stress : Not on file  Social Connections:   . Frequency of Communication with Friends and Family: Not on file  . Frequency of Social Gatherings with Friends and Family: Not on file  . Attends Religious Services: Not on file  . Active Member of Clubs or Organizations: Not on file  . Attends Archivist Meetings: Not on file  . Marital Status: Not on file   Additional Social History:                         Sleep: Poor  Appetite:  Good  Current Medications: Current Facility-Administered Medications  Medication Dose Route Frequency Provider Last Rate Last Admin  . acetaminophen (TYLENOL) tablet 650 mg  650 mg Oral Q6H PRN Money, Lowry Ram, FNP      . alum & mag hydroxide-simeth (MAALOX/MYLANTA) 200-200-20 MG/5ML suspension 30 mL  30 mL Oral Q4H PRN Money, Darnelle Maffucci B, FNP      . lithium carbonate (LITHOBID) CR tablet 300 mg  300 mg Oral Q12H Money, Lowry Ram, FNP   300 mg at 01/26/20 6659  . LORazepam (ATIVAN) tablet 1 mg  1 mg Oral Q6H PRN Cristofano, Dorene Ar, MD   1 mg at 01/26/20 0811  . magnesium hydroxide (MILK OF MAGNESIA) suspension 30 mL  30 mL Oral Daily PRN Money, Darnelle Maffucci B, FNP      . nicotine (NICODERM CQ - dosed in mg/24 hours) patch 21 mg  21 mg Transdermal Q0600 Money, Lowry Ram, FNP   21 mg at 01/26/20 0813  . QUEtiapine (SEROQUEL) tablet 100 mg  100 mg Oral QHS Cristofano, Dorene Ar, MD   100 mg at 01/25/20 2108    Lab Results:  Results for orders placed or performed  during the hospital encounter of 01/24/20 (from the past 48 hour(s))  Resp Panel by RT-PCR (Flu A&B, Covid) Nasopharyngeal Swab     Status: None   Collection Time: 01/24/20 11:03 AM   Specimen: Nasopharyngeal Swab; Nasopharyngeal(NP) swabs in vial transport medium  Result Value Ref Range   SARS Coronavirus 2 by RT PCR NEGATIVE NEGATIVE    Comment: (NOTE) SARS-CoV-2 target nucleic acids are NOT DETECTED.  The SARS-CoV-2 RNA is generally detectable in upper respiratory specimens during the acute phase of infection. The lowest concentration of SARS-CoV-2 viral copies this assay can detect is 138 copies/mL. A negative result does not preclude SARS-Cov-2 infection and should not be used as the sole basis for treatment or other patient management decisions. A negative result may occur with  improper specimen collection/handling, submission of specimen other than nasopharyngeal swab, presence of viral mutation(s) within the areas targeted by this assay, and inadequate number of viral copies(<138 copies/mL). A negative result must be combined with clinical observations, patient history, and epidemiological information. The expected result is Negative.  Fact Sheet for Patients:  EntrepreneurPulse.com.au  Fact Sheet for Healthcare Providers:  IncredibleEmployment.be  This test is no t yet approved or cleared by the [place] FDA and  has been authorized for detection and/or diagnosis of SARS-CoV-2 by FDA under an Emergency Use Authorization (EUA). This EUA will remain  in effect (meaning this test can be used) for the duration of the COVID-19 declaration under Section 564(b)(1) of the Act, 21 U.S.C.section 360bbb-3(b)(1), unless the authorization is terminated  or revoked sooner.       Influenza A by PCR NEGATIVE NEGATIVE   Influenza B by PCR NEGATIVE NEGATIVE    Comment: (NOTE) The Xpert Xpress SARS-CoV-2/FLU/RSV plus assay is intended as an  aid in the diagnosis of influenza from Nasopharyngeal swab specimens and should not be used as a sole basis for treatment. Nasal washings and aspirates are unacceptable for Xpert Xpress SARS-CoV-2/FLU/RSV testing.  Fact Sheet for Patients: EntrepreneurPulse.com.au  Fact Sheet for Healthcare Providers: IncredibleEmployment.be  This test is not yet approved or cleared by the [place] FDA and has been authorized for detection and/or diagnosis of SARS-CoV-2 by FDA under an Emergency Use Authorization (EUA). This EUA will remain in effect (meaning this test can be used) for the duration of the COVID-19 declaration under Section 564(b)(1) of the Act, 21 U.S.C. section 360bbb-3(b)(1), unless the authorization is terminated or revoked.  Performed at [facility]  [facility], [address], [place], [place] [address]   POC SARS Coronavirus 2 Ag-ED - Nasal Swab (BD Veritor Kit)     Status: None   Collection Time: 01/24/20 11:05 AM  Result Value Ref Range   SARS Coronavirus 2 Ag Negative Negative  POCT Urine Drug Screen - (ICup)     Status: Abnormal   Collection Time: 01/24/20 11:09 AM  Result Value Ref Range   POC Amphetamine UR None Detected None Detected   POC Secobarbital (BAR) None Detected None Detected   POC Buprenorphine (BUP) None Detected None Detected   POC Oxazepam (BZO) None Detected None Detected   POC Cocaine UR Positive (A) None Detected   POC Methamphetamine UR None Detected None Detected   POC Morphine None Detected None Detected   POC Oxycodone UR None Detected None Detected   POC Methadone UR None Detected None Detected   POC Marijuana UR Positive (A) None Detected  CBC with Differential/Platelet     Status: Abnormal   Collection Time: 01/24/20 11:22 AM  Result Value Ref Range   WBC 16.8 (H) 4.0 - 10.5 K/uL   RBC 5.35 4.22 - 5.81 MIL/uL   Hemoglobin 15.9 13.0 - 17.0 g/dL   HCT 46.1 39 - 52 %   MCV 86.2 80.0 - 100.0 fL   MCH  29.7 26.0 - 34.0 pg   MCHC 34.5 30.0 - 36.0 g/dL   RDW 13.2 11.5 - 15.5 %   Platelets 401 (H) 150 - 400 K/uL   nRBC 0.0 0.0 - 0.2 %   Neutrophils Relative % 81 %   Neutro Abs 13.6 (H) 1.7 - 7.7 K/uL   Lymphocytes Relative 11 %   Lymphs Abs 1.8 0.7 - 4.0 K/uL   Monocytes Relative 7 %   Monocytes Absolute 1.2 (H) 0.1 - 1.0 K/uL   Eosinophils Relative 0 %   Eosinophils Absolute 0.0 0.0 - 0.5 K/uL   Basophils Relative 0 %   Basophils Absolute 0.1 0.0 - 0.1 K/uL   Immature Granulocytes 1 %   Abs Immature Granulocytes 0.08 (H) 0.00 - 0.07 K/uL    Comment: Performed at [facility], [place] [address] [place], [place] [address]  Hemoglobin A1c     Status: None   Collection Time: 01/24/20 11:22 AM  Result Value Ref Range   Hgb A1c MFr Bld 5.2 4.8 - 5.6 %    Comment: (NOTE) Pre diabetes:          5.7%-6.4%  Diabetes:              >6.4%  Glycemic control for   <7.0% adults with diabetes    Mean Plasma Glucose 102.54 mg/dL    Comment: Performed at [facility] [address], [place], [place] [address]  Comprehensive metabolic panel     Status: Abnormal   Collection Time: 01/24/20 11:22 AM  Result Value Ref Range   Sodium 140 135 - 145 mmol/L   Potassium 3.6 3.5 - 5.1 mmol/L   Chloride 101 98 - 111 mmol/L   CO2 23 22 - 32 mmol/L   Glucose, Bld 88 70 - 99 mg/dL    Comment: Glucose reference range applies only to samples taken after fasting for at least 8 hours.   BUN 8 6 - 20 mg/dL   Creatinine, Ser 0.80 0.61 - 1.24 mg/dL   Calcium 9.7 8.9 - 10.3 mg/dL   Total Protein 7.8 6.5 - 8.1 g/dL   Albumin 5.0 3.5 -  5.0 g/dL   AST 38 15 - 41 U/L   ALT 26 0 - 44 U/L   Alkaline Phosphatase 69 38 - 126 U/L   Total Bilirubin 0.8 0.3 - 1.2 mg/dL   GFR, Estimated >60 >60 mL/min    Comment: (NOTE) Calculated using the CKD-EPI Creatinine Equation (2021)    Anion gap 16 (H) 5 - 15    Comment: Performed at [facility] [address], [place], [place] [address]  TSH      Status: None   Collection Time: 01/24/20 11:22 AM  Result Value Ref Range   TSH 0.612 0.350 - 4.500 uIU/mL    Comment: Performed by a 3rd Generation assay with a functional sensitivity of <=0.01 uIU/mL. Performed at [facility], [place] [address] [place], [place] [address]   Lipid panel     Status: Abnormal   Collection Time: 01/24/20 11:22 AM  Result Value Ref Range   Cholesterol 217 (H) 0 - 200 mg/dL   Triglycerides 79 <150 mg/dL   HDL 81 >40 mg/dL   Total CHOL/HDL Ratio 2.7 RATIO   VLDL 16 0 - 40 mg/dL   LDL Cholesterol 120 (H) 0 - 99 mg/dL    Comment:        Total Cholesterol/HDL:CHD Risk Coronary Heart Disease Risk Table                     Men   Women  1/2 Average Risk   3.4   3.3  Average Risk       5.0   4.4  2 X Average Risk   9.6   7.1  3 X Average Risk  23.4   11.0        Use the calculated Patient Ratio above and the CHD Risk Table to determine the patient's CHD Risk.        ATP III CLASSIFICATION (LDL):  <100     mg/dL   Optimal  100-129  mg/dL   Near or Above                    Optimal  130-159  mg/dL   Borderline  160-189  mg/dL   High  >190     mg/dL   Very High Performed at [facility] [address], [place], [place] [address]   POC SARS Coronavirus 2 Ag     Status: None   Collection Time: 01/24/20 11:28 AM  Result Value Ref Range   SARS Coronavirus 2 Ag NEGATIVE NEGATIVE    Comment: (NOTE) SARS-CoV-2 antigen NOT DETECTED.   Negative results are presumptive.  Negative results do not preclude SARS-CoV-2 infection and should not be used as the sole basis for treatment or other patient management decisions, including infection  control decisions, particularly in the presence of clinical signs and  symptoms consistent with COVID-19, or in those who have been in contact with the virus.  Negative results must be combined with clinical observations, patient history, and epidemiological information. The expected result is Negative.  Fact  Sheet for Patients: PodPark.tn  Fact Sheet for Healthcare Providers: GiftContent.is   This test is not yet approved or cleared by the [place] FDA and  has been authorized for detection and/or diagnosis of SARS-CoV-2 by FDA under an Emergency Use Authorization (EUA).  This EUA will remain in effect (meaning this test can be used) for the duration of  the C OVID-19 declaration under Section  564(b)(1) of the Act, 21 U.S.C. section 360bbb-3(b)(1), unless the authorization is terminated or revoked sooner.      Blood Alcohol level:  Lab Results  Component Value Date   ETH <10 11/23/3005    Metabolic Disorder Labs: Lab Results  Component Value Date   HGBA1C 5.2 01/24/2020   MPG 102.54 01/24/2020   No results found for: PROLACTIN Lab Results  Component Value Date   CHOL 217 (H) 01/24/2020   TRIG 79 01/24/2020   HDL 81 01/24/2020   CHOLHDL 2.7 01/24/2020   VLDL 16 01/24/2020   LDLCALC 120 (H) 01/24/2020    Physical Findings: AIMS:  , ,  ,  ,    CIWA:    COWS:     Musculoskeletal: Strength & Muscle Tone: within normal limits Gait & Station: remained in bed on exam Patient leans: N/A  Psychiatric Specialty Exam: Physical Exam HENT:     Head: Normocephalic and atraumatic.  Pulmonary:     Effort: Pulmonary effort is normal.  Neurological:     Mental Status: He is alert.     Review of Systems  Cardiovascular: Negative for chest pain.  Gastrointestinal: Positive for diarrhea.  Neurological: Negative for headaches.    Blood pressure 125/74, pulse 80, temperature 98 F (36.7 C), temperature source Oral, resp. rate 12, height 5' 10"  (1.778 m), weight 84.8 kg, SpO2 98 %.Body mass index is 26.83 kg/m.  General Appearance: Casual  Eye Contact:  Fair  Speech:  Clear and Coherent  Volume:  Normal  Mood:  Euthymic  Affect:  Appropriate  Thought Process:  Coherent  Orientation:  NA  Thought Content:   Logical  Suicidal Thoughts:  No  Homicidal Thoughts:  No  Memory:  Negative NA  Judgement:  Fair  Insight:  Fair  Psychomotor Activity:  Normal  Concentration:  Concentration: Fair  Recall:  NA  Fund of Knowledge:  Fair  Language:  Good  Akathisia:  No  Handed:  Right  AIMS (if indicated):     Assets:  Communication Skills Desire for Improvement Physical Health Resilience Talents/Skills Vocational/Educational  ADL's:  Intact  Cognition:  WNL  Sleep:  Number of Hours: 6.75     Treatment Plan Summary: Daily contact with patient to assess and evaluate symptoms and progress in treatment  Patient appears to be responding well to restarting his Lithium. Will continue to monitor sleep. Will allow patient to have time to be on the unit a full day and reassess sleep tomorrow may increase Seroquel tom if sleep continues to be poor. Patient is requiring Ativan PRN for anxiety.  Scheduled Lithium 39m BID Nicotine patch 283mSeorquel 10032mHS  PRN -Tylenol 650m4mh -Maalox 30ml20m - Ativan 1mg q65m-Milk of Mag 30mL  57mB MFreida Busman/25/2021, 9:45 AM

## 2020-01-26 NOTE — Progress Notes (Signed)
[facility] Group Notes:  (Nursing/MHT/Case Management/Adjunct)  Date:  01/26/2020  Time:  2015  Type of Therapy:  wrap up group  Participation Level:  Active  Participation Quality:  Appropriate, Attentive, Sharing and Supportive  Affect:  Appropriate  Cognitive:  Appropriate  Insight:  Improving  Engagement in Group:  Engaged  Modes of Intervention:  Clarification, Education and Support  Summary of Progress/Problems: Positive thinking and positive change were discussed.   Brett House 01/26/2020, 8:54 PM

## 2020-01-26 NOTE — Progress Notes (Signed)
   01/26/20 2107  Psych Admission Type (Psych Patients Only)  Admission Status Voluntary  Psychosocial Assessment  Patient Complaints Anxiety  Eye Contact Fair  Facial Expression Anxious  Affect Anxious;Depressed  Speech Logical/coherent  Interaction Minimal  Motor Activity Other (Comment) (WDL)  Appearance/Hygiene Unremarkable  Behavior Characteristics Anxious  Mood Depressed;Anxious  Thought Process  Coherency WDL  Content WDL  Delusions None reported or observed  Perception WDL  Hallucination None reported or observed  Judgment WDL  Confusion None  Danger to Self  Current suicidal ideation? Denies  Danger to Others  Danger to Others None reported or observed  D: Patient presents with flat affect but is pleasant upon interaction. Patient reports some anxiety at the time of assessment. Patient denies SI/HI at this time. Patient also denies AH/VH at this time. Patient contracts for safety.  A: Provided positive reinforcement and encouragement.  R: Patient cooperative and receptive to efforts. Patient remains safe on the unit.

## 2020-01-26 NOTE — Plan of Care (Signed)
Nurse discussed anxiety, depression, coping skills with patient. 

## 2020-01-26 NOTE — Progress Notes (Addendum)
D:  Patient's self inventory sheet, patient has fair sleep, sleep medication helpful.  Good appetite, low energy level, poor concentration.  Rated depression 5, hopeless, anxiety 7.  Denied withdrawals.  Denied SI.  Denied physical problems.  Denied physical pain.  Goal is talk to family about tomorrow's discharge..  Plans to attend more groups.  Does go to group today.   Would like someone to take him until he gets back on his feet.   A:  Medications administered to patient per MD orders.  Emotional support and encouragement given patient. R:  SI and HI denied, contracts for safety.  Denied A/V hallucinations.  Safety maintained with 15 minute checks. Marland Kitchen

## 2020-01-27 DIAGNOSIS — F1410 Cocaine abuse, uncomplicated: Secondary | ICD-10-CM | POA: Diagnosis present

## 2020-01-27 DIAGNOSIS — F1210 Cannabis abuse, uncomplicated: Secondary | ICD-10-CM | POA: Diagnosis present

## 2020-01-27 LAB — LITHIUM LEVEL: Lithium Lvl: 0.38 mmol/L — ABNORMAL LOW (ref 0.60–1.20)

## 2020-01-27 MED ORDER — NICOTINE 21 MG/24HR TD PT24: 21.0000 mg | MEDICATED_PATCH | TRANSDERMAL | 0 refills | Status: DC

## 2020-01-27 MED ORDER — QUETIAPINE FUMARATE 100 MG PO TABS: 100.0000 mg | ORAL_TABLET | Freq: Every day | ORAL | 0 refills | Status: DC

## 2020-01-27 MED ORDER — LITHIUM CARBONATE ER 450 MG PO TBCR
450.0000 mg | EXTENDED_RELEASE_TABLET | Freq: Two times a day (BID) | ORAL | Status: DC
  Filled 2020-01-27 (×4): qty 1

## 2020-01-27 MED ORDER — LITHIUM CARBONATE 150 MG PO CAPS
150.0000 mg | ORAL_CAPSULE | ORAL | Status: AC
  Administered 2020-01-27: 150 mg via ORAL
  Filled 2020-01-27 (×2): qty 1

## 2020-01-27 MED ORDER — LITHIUM CARBONATE ER 450 MG PO TBCR: 450.0000 mg | EXTENDED_RELEASE_TABLET | Freq: Two times a day (BID) | ORAL | 0 refills | Status: DC

## 2020-01-27 NOTE — Progress Notes (Signed)
  [facility] Adult Case Management Discharge Plan :  Will you be returning to the same living situation after discharge:  Yes,  Home with girlfriend  At discharge, do you have transportation home?: Yes,  Girlfriend or safe transport  Do you have the ability to pay for your medications: No.  Release of information consent forms completed and in the chart;  Patient's signature needed at discharge.  Patient to Follow up at:  Follow-up Information    [facility] Follow up on 01/30/2020.   Specialty: Behavioral Health Why: Please use walk-in hours from 8am to 5pm to schedule therapy and medicaiton management.  Insurance is not needed for these appointments.   Contact information: [address] [place] [address] [phone]              Next level of care provider has access to [facility] Link:yes  Safety Planning and Suicide Prevention discussed: Yes,  with patient   Have you used any form of tobacco in the last 30 days? (Cigarettes, Smokeless Tobacco, Cigars, and/or Pipes): Yes  Has patient been referred to the Quitline?: Patient refused referral  Patient has been referred for addiction treatment: N/A  Aram Beecham, LCSWA 01/27/2020, 10:08 AM

## 2020-01-27 NOTE — [facility] Suicide Risk Assessment (Signed)
[facility] Discharge Suicide Risk Assessment   Principal Problem: Bipolar I disorder, current episode depressed (H[facility]) Discharge Diagnoses: Principal Problem:   Bipolar I disorder, current episode depressed ([facility]) Active Problems:   Cocaine use disorder, mild, abuse (HCC)   Marijuana abuse   Total Time spent with patient: 35 minutes   Brett House is a 34 y.o. male reports improved mood and is hopeful for his future. He has plans with his fiance' to start individual and couples therapy.  He is eager to get back to work.  He states that he learned, "I need to open my mouth when I need help, because it saved my life.  I know now that my life is worth living."  He discussed strategies to talk about problems instead of arguing.  He denies suicidal or homicidal ideation   Musculoskeletal: Strength & Muscle Tone: within normal limits Gait & Station: normal Patient leans: N/A  Psychiatric Specialty Exam: Review of Systems  Constitutional: Negative.   HENT: Negative.   Respiratory: Negative.   Cardiovascular: Negative.   Gastrointestinal: Negative.   Musculoskeletal: Negative.   Neurological: Negative.   Psychiatric/Behavioral: Negative for agitation, behavioral problems, confusion, decreased concentration, dysphoric mood, hallucinations, self-injury, sleep disturbance and suicidal ideas. The patient is not nervous/anxious and is not hyperactive.     Blood pressure 104/67, pulse 88, temperature 98.1 F (36.7 C), temperature source Oral, resp. rate 16, height 5\' 10"  (1.778 m), weight 84.8 kg, SpO2 97 %.Body mass index is 26.83 kg/m.  General Appearance: Casual and Neat  Eye Contact::  Good  Speech:  Clear and Coherent and Normal Rate409  Volume:  Normal  Mood:  Hopeless  Affect:  Appropriate and Congruent  Thought Process:  Coherent, Linear and Descriptions of Associations: Intact  Orientation:  Full (Time, Place, and Person)  Thought Content:  Logical and Hallucinations: None  Suicidal  Thoughts:  No  Homicidal Thoughts:  No  Memory:  Immediate;   Good Recent;   Good Remote;   Good  Judgement:  Fair  Insight:  Fair  Psychomotor Activity:  Normal  Concentration:  Good  Recall:  Good  Fund of Knowledge:Good  Language: Good  Akathisia:  No  Handed:  Right  AIMS (if indicated):     Assets:  Communication Skills Desire for Improvement Financial Resources/Insurance Housing Physical Health Resilience Social Support Vocational/Educational  Sleep:  Number of Hours: 5.25  Cognition: WNL  ADL's:  Intact   Mental Status Per Nursing Assessment::   On Admission:  Suicidal ideation indicated by patient  Demographic Factors:  Caucasian  male Loss Factors: NA  Historical Factors: Prior suicide attempts and Impulsivity  Risk Reduction Factors:   Employed, Positive social support and Positive coping skills or problem solving skills  Continued Clinical Symptoms:  Bipolar Disorder:   Depressive phase Depression:   Comorbid alcohol abuse/dependence  Cognitive Features That Contribute To Risk:  None    Suicide Risk:  Minimal: No identifiable suicidal ideation.  Patients presenting with no risk factors but with morbid ruminations; may be classified as minimal risk based on the severity of the depressive symptoms   Follow-up Information    [facility] Follow up on 01/30/2020.   Specialty: Behavioral Health Why: Please use walk-in hours from 8am to 5pm to schedule therapy and medicaiton management.  Insurance is not needed for these appointments.   Contact information: [address] [place] [phone]  Plan Of Care/Follow-up recommendations:  Activity:  ad lib Diet:  as tolerated Tests:  Will need Lithium level in 5 days.  A lab order is placed for any [facility] facility Other:  Please ensure that you follow-up with outpatient mental health services and a primary care provider.  On day  of discharge following sustained improvement in the affect of this patient, continued report of euthymic mood, repeated denial of suicidal, homicidal, and other violent ideation, adequate interaction with peers, active participation in groups while on the unit, and denial of adverse reactions from medications, the treatment team decided Brett House was stable for discharge home with scheduled mental health treatment as noted above.  He was able to engage in safety planning including plan to return to [facility] or contact emergency services if he feels unable to maintain his own safety or the safety of others. Patient had no further questions, comments, or concerns. Discharge into care of significant other, who agrees to maintain patient safety.  Patient aware to return to nearest crisis center, ED or to call 911 for worsening symptoms of depression, suicidal or homicidal thoughts or AVH.     Mariel Craft, MD 01/27/2020, 11:36 AM

## 2020-01-27 NOTE — Progress Notes (Signed)
Pt discharged to lobby. Pt was stable and appreciative at that time. All papers, samples and prescriptions were given and valuables returned. Verbal understanding expressed. Denies SI/HI and A/VH. Pt given opportunity to express concerns and ask questions.  
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
## 2020-01-27 NOTE — Progress Notes (Signed)
Recreation Therapy Notes  Date:  11.26.21 Time: 0930 Location: 300 Hall Group Room  Group Topic: Stress Management  Goal Area(s) Addresses:  Patient will identify positive stress management techniques. Patient will identify benefits of using stress management post d/c.  Intervention: Stress Management  Activity: Meditation.  LRT played a meditation that focused on making the most of your day and the possibilities that possibly await as the day goes on.    Education:  Stress Management, Discharge Planning.   Education Outcome: Acknowledges Education  Clinical Observations/Feedback: Pt did not attend group session.    Caroll Rancher, LRT/CTRS         Caroll Rancher A 01/27/2020 11:28 AM

## 2020-09-21 ENCOUNTER — Other Ambulatory Visit: Payer: Self-pay

## 2020-09-21 ENCOUNTER — Encounter (HOSPITAL_COMMUNITY): Payer: Self-pay | Admitting: Emergency Medicine

## 2020-09-21 ENCOUNTER — Emergency Department (HOSPITAL_COMMUNITY)
Admission: EM | Admit: 2020-09-21 | Discharge: 2020-09-25 | Disposition: A | Discharge status: Other Acute Inpatient Hospital | Payer: Self-pay | Attending: Emergency Medicine | Admitting: Emergency Medicine

## 2020-09-21 DIAGNOSIS — J45909 Unspecified asthma, uncomplicated: Secondary | ICD-10-CM | POA: Insufficient documentation

## 2020-09-21 DIAGNOSIS — F1210 Cannabis abuse, uncomplicated: Secondary | ICD-10-CM | POA: Diagnosis present

## 2020-09-21 DIAGNOSIS — Z20822 Contact with and (suspected) exposure to covid-19: Secondary | ICD-10-CM | POA: Insufficient documentation

## 2020-09-21 DIAGNOSIS — F3130 Bipolar disorder, current episode depressed, mild or moderate severity, unspecified: Secondary | ICD-10-CM | POA: Insufficient documentation

## 2020-09-21 DIAGNOSIS — F1410 Cocaine abuse, uncomplicated: Secondary | ICD-10-CM | POA: Diagnosis present

## 2020-09-21 DIAGNOSIS — R45851 Suicidal ideations: Secondary | ICD-10-CM | POA: Insufficient documentation

## 2020-09-21 DIAGNOSIS — F17210 Nicotine dependence, cigarettes, uncomplicated: Secondary | ICD-10-CM | POA: Insufficient documentation

## 2020-09-21 DIAGNOSIS — F319 Bipolar disorder, unspecified: Secondary | ICD-10-CM | POA: Diagnosis present

## 2020-09-21 DIAGNOSIS — F23 Brief psychotic disorder: Secondary | ICD-10-CM | POA: Insufficient documentation

## 2020-09-21 DIAGNOSIS — F29 Unspecified psychosis not due to a substance or known physiological condition: Secondary | ICD-10-CM

## 2020-09-21 DIAGNOSIS — Z79899 Other long term (current) drug therapy: Secondary | ICD-10-CM | POA: Insufficient documentation

## 2020-09-21 DIAGNOSIS — R440 Auditory hallucinations: Secondary | ICD-10-CM

## 2020-09-21 DIAGNOSIS — Y900 Blood alcohol level of less than 20 mg/100 ml: Secondary | ICD-10-CM | POA: Insufficient documentation

## 2020-09-21 DIAGNOSIS — F329 Major depressive disorder, single episode, unspecified: Secondary | ICD-10-CM | POA: Insufficient documentation

## 2020-09-21 LAB — URINALYSIS, ROUTINE W REFLEX MICROSCOPIC
Bilirubin Urine: NEGATIVE
Glucose, UA: NEGATIVE mg/dL
Hgb urine dipstick: NEGATIVE
Ketones, ur: NEGATIVE mg/dL
Leukocytes,Ua: NEGATIVE
Nitrite: NEGATIVE
Protein, ur: NEGATIVE mg/dL
Specific Gravity, Urine: 1.014 (ref 1.005–1.030)
pH: 7 (ref 5.0–8.0)

## 2020-09-21 LAB — COMPREHENSIVE METABOLIC PANEL
ALT: 13 U/L (ref 0–44)
AST: 15 U/L (ref 15–41)
Albumin: 4.4 g/dL (ref 3.5–5.0)
Alkaline Phosphatase: 62 U/L (ref 38–126)
Anion gap: 11 (ref 5–15)
BUN: 15 mg/dL (ref 6–20)
CO2: 25 mmol/L (ref 22–32)
Calcium: 9.4 mg/dL (ref 8.9–10.3)
Chloride: 103 mmol/L (ref 98–111)
Creatinine, Ser: 0.95 mg/dL (ref 0.61–1.24)
GFR, Estimated: >60 mL/min (ref >60)
Glucose, Bld: 111 mg/dL — ABNORMAL HIGH (ref 70–99)
Potassium: 4 mmol/L (ref 3.5–5.1)
Sodium: 139 mmol/L (ref 135–145)
Total Bilirubin: 0.6 mg/dL (ref 0.3–1.2)
Total Protein: 6.8 g/dL (ref 6.5–8.1)

## 2020-09-21 LAB — CBC
HCT: 40 % (ref 39.0–52.0)
Hemoglobin: 13.9 g/dL (ref 13.0–17.0)
MCH: 30.3 pg (ref 26.0–34.0)
MCHC: 34.8 g/dL (ref 30.0–36.0)
MCV: 87.3 fL (ref 80.0–100.0)
Platelets: 292 10*3/uL (ref 150–400)
RBC: 4.58 MIL/uL (ref 4.22–5.81)
RDW: 12.3 % (ref 11.5–15.5)
WBC: 10.5 10*3/uL (ref 4.0–10.5)
nRBC: 0 % (ref 0.0–0.2)

## 2020-09-21 LAB — ETHANOL: Alcohol, Ethyl (B): <10 mg/dL (ref <10)

## 2020-09-21 LAB — SALICYLATE LEVEL: Salicylate Lvl: <7 mg/dL — ABNORMAL LOW (ref 7.0–30.0)

## 2020-09-21 LAB — ACETAMINOPHEN LEVEL: Acetaminophen (Tylenol), Serum: <10 ug/mL — ABNORMAL LOW (ref 10–30)

## 2020-09-21 NOTE — ED Provider Notes (Signed)
Emergency Medicine Provider Triage Evaluation Note  Brett House , a 35 y.o. male  was evaluated in triage.  Pt complains of hallucinations and suicidal ideations.  Prior attempts, no plan.  Has not been on his medicine since November.  Is not having any homicidal ideations..  Review of Systems  Positive: SI, hallucination Negative: GI  Physical Exam  Ht 5\' 10"  (1.778 m)   Wt 84.8 kg   BMI 26.83 kg/m  Gen:   Awake, no distress   Resp:  Normal effort  MSK:   Moves extremities without difficulty  Other:    Medical Decision Making  Medically screening exam initiated at 8:17 PM.  Appropriate orders placed.  Brett House was informed that the remainder of the evaluation will be completed by another provider, this initial triage assessment does not replace that evaluation, and the importance of remaining in the ED until their evaluation is complete.     [name], PA-C 09/21/20 2022    2023, MD 09/24/20 [id number]

## 2020-09-21 NOTE — BH Assessment (Signed)
TTS attempted to assess at 11:42a.  Per Augusto Garbe RN, pt unable to participate in assessment at this time.  TTS to see pt at a later time.

## 2020-09-21 NOTE — ED Triage Notes (Signed)
Pt is anxious, restless, and tearful in triage. Request something to help him calm down. Message relayed to PIT PA

## 2020-09-21 NOTE — ED Triage Notes (Signed)
Pt presents to ED voluntary. Pt. states auditory and visual hallucinations for past 8 months, worsening today. Endorses SI and HI. HX anxiety, bipolar, and depression. Has been noncompliant with medications since November. Denies ETOH/Drugs.

## 2020-09-22 LAB — RESP PANEL BY RT-PCR (FLU A&B, COVID) ARPGX2
Influenza A by PCR: NEGATIVE
Influenza B by PCR: NEGATIVE
SARS Coronavirus 2 by RT PCR: NEGATIVE

## 2020-09-22 LAB — RAPID URINE DRUG SCREEN, HOSP PERFORMED
Amphetamines: NOT DETECTED
Barbiturates: NOT DETECTED
Benzodiazepines: NOT DETECTED
Cocaine: NOT DETECTED
Opiates: NOT DETECTED
Tetrahydrocannabinol: NOT DETECTED

## 2020-09-22 MED ORDER — ALUM & MAG HYDROXIDE-SIMETH 200-200-20 MG/5ML PO SUSP: 30.0000 mL | Freq: Four times a day (QID) | ORAL | Status: DC | PRN

## 2020-09-22 MED ORDER — NICOTINE 21 MG/24HR TD PT24
21.0000 mg | MEDICATED_PATCH | Freq: Once | TRANSDERMAL | Status: AC
Start: 2020-09-22 — End: 2020-09-24
  Administered 2020-09-22 – 2020-09-23 (×2): 21 mg via TRANSDERMAL
  Filled 2020-09-22: qty 1

## 2020-09-22 MED ORDER — ACETAMINOPHEN 325 MG PO TABS
650.0000 mg | ORAL_TABLET | ORAL | Status: DC | PRN
  Administered 2020-09-24: 650 mg via ORAL
  Filled 2020-09-22: qty 2

## 2020-09-22 MED ORDER — LORAZEPAM 1 MG PO TABS
1.0000 mg | ORAL_TABLET | ORAL | Status: AC | PRN
Start: 2020-09-22 — End: 2020-09-23
  Administered 2020-09-23: 1 mg via ORAL
  Filled 2020-09-22: qty 1

## 2020-09-22 MED ORDER — ZOLPIDEM TARTRATE 5 MG PO TABS: 5.0000 mg | ORAL_TABLET | Freq: Every evening | ORAL | Status: DC | PRN

## 2020-09-22 MED ORDER — NICOTINE 21 MG/24HR TD PT24
21.0000 mg | MEDICATED_PATCH | Freq: Every day | TRANSDERMAL | Status: DC
Start: 2020-09-22 — End: 2020-09-25
  Administered 2020-09-22 – 2020-09-25 (×4): 21 mg via TRANSDERMAL
  Filled 2020-09-22 (×5): qty 1

## 2020-09-22 MED ORDER — LITHIUM CARBONATE ER 450 MG PO TBCR
450.0000 mg | EXTENDED_RELEASE_TABLET | Freq: Two times a day (BID) | ORAL | Status: DC
  Administered 2020-09-22 (×2): 450 mg via ORAL
  Filled 2020-09-22 (×3): qty 1

## 2020-09-22 MED ORDER — ONDANSETRON HCL 4 MG PO TABS: 4.0000 mg | ORAL_TABLET | Freq: Three times a day (TID) | ORAL | Status: DC | PRN

## 2020-09-22 MED ORDER — ZIPRASIDONE MESYLATE 20 MG IM SOLR
20.0000 mg | INTRAMUSCULAR | Status: DC | PRN
Start: 2020-09-22 — End: 2020-09-23

## 2020-09-22 MED ORDER — RISPERIDONE 2 MG PO TBDP
2.0000 mg | ORAL_TABLET | Freq: Three times a day (TID) | ORAL | Status: DC | PRN
  Administered 2020-09-23: 2 mg via ORAL
  Filled 2020-09-22 (×2): qty 1

## 2020-09-22 NOTE — ED Notes (Signed)
Pt given gingerale and sandwich. Denies further needs. Sitter remains at bedside.

## 2020-09-22 NOTE — ED Notes (Signed)
Psych at bedside.

## 2020-09-22 NOTE — ED Notes (Signed)
Pt AxO x4. Pt given coke in styrofoam cup. Pt denies further needs. Pt has sitter at bedside.

## 2020-09-22 NOTE — ED Notes (Signed)
Received verbal report from Amanda P RN at this time 

## 2020-09-22 NOTE — BH Assessment (Signed)
Comprehensive Clinical Assessment (CCA) Note  09/22/2020 Brett House [id number] DISPOSITION: Brett Market NP recommends a inpatient admission to assist with stabilization.   Flowsheet Row ED from 09/21/2020 in [facility] EMERGENCY DEPARTMENT Admission ([facility]) [facility] from 01/24/2020 in [facility]  C-SSRS RISK CATEGORY High Risk High Risk High Risk      The patient demonstrates the following risk factors for suicide: Chronic risk factors for suicide include: psychiatric disorder of depression . Acute risk factors for suicide include:  depression . Protective factors for this patient include: coping skills. Considering these factors, the overall suicide risk at this point appears to be high. Patient is not appropriate for outpatient follow up.   Patient is a 35 year old male that presents this date with ongoing depression and AVH. Patient also reports ongoing S/I and H/I. Patient is vague in reference to plan or intent. Patient voices H/I although states he "would rather not say" when asked in reference to who he was going to harm. Patient reports ongoing AH stating he is hearing voices that are "saying his name." Patient reports VH stating he sees "a black mist." Patient denies any recent SA use with UDS pending this date. Patient per chart review patient has a history of amphetamine use and tested positive for that substance when he was seen on 10/05/19 when he presented with similar symptoms. Patient has a history significant for Bipolar Depression and reports he has not been on medications since November 2021. Patient cannot recall what medications he was prescribed at that time or name of provider. Patient reports feeling hopeless and states he has been depressed for the last few weeks as symptoms have intensified over the last few days with symptoms to include: feeling hopeless and isolating. Patient  reports two prior attempts to self harm once in 2013 when he walked into traffic and once in 2017 when he attempted to hang himself. Patient endorses current SI and does not feel he can be safe if he were discharged. Patient is unable to contract for safety and is in need of inpatient treatment for stabilization.    Brett Norman PA writes on arrival: EDP writes on arrival 35 year old male significant history of bipolar, anxiety, depression, asthma, who presents with complaints of hallucination and suicidal ideation.  Patient report for the past 6 months.  He has had progressive worsening auditory and visual hallucination.  For the past several days he also endorsed feeling suicidal because it seems like no one believes or acknowledges hallucination.  He does not have any specific plan and denies any homicidal ideation.  He feels depressed.  He is eating and sleeping less.  He denies self-medicating with drugs or alcohol.  He is currently not on any antipsychotic medication.  He does have family history of schizophrenia.  He is here requesting for mental help.  He denies any active pain.  Patient is a poor historian and renders limited history this date. Patient is oriented x 5. Patient speaks in a low soft voice that is difficult to understand at times. Patient presents with a depressed mood with affect congruent. Patient's memory is intact with thoughts somewhat disorganized. It is unclear if patient is responding to internal stimuli at this time.      Chief Complaint:  Chief Complaint  Patient presents with   Hallucinations   Visit Diagnosis: Bipolar Depression    CCA Screening, Triage and Referral (STR)  Patient Reported  Information How did you hear about us? Self  What Is the Reason for Your Visit/Call Today? Altered mental status  How Long Has This Been Causing You Problems? 1 wk - 1 month  What Do You Feel Would Help You the Most Today? Medication(s)   Have You Recently Had Any Thoughts  About Hurting Yourself? Yes  Are You Planning to Commit Suicide/Harm Yourself At This time? No   Have you Recently Had Thoughts About Hurting Someone Brett Ohslse? No  Are You Planning to Harm Someone at This Time? Yes  Explanation: Pt renders limited information in reference to H/I   Have You Used Any Alcohol or Drugs in the Past 24 Hours? No  How Long Ago Did You Use Drugs or Alcohol? No data recorded What Did You Use and How Much? No data recorded  Do You Currently Have a Therapist/Psychiatrist? No  Name of Therapist/Psychiatrist: No data recorded  Have You Been Recently Discharged From Any Office Practice or Programs? No  Explanation of Discharge From Practice/Program: No data recorded    CCA Screening Triage Referral Assessment Type of Contact: Face-to-Face  Telemedicine Service Delivery:   Is this Initial or Reassessment? No data recorded Date Telepsych consult ordered in [facility]:  No data recorded Time Telepsych consult ordered in [facility]:  No data recorded [facility]  Provider Location: Other (comment) ([facility])   Collateral Involvement: None at this time   Does Patient Have a Court Appointed Legal Guardian? No data recorded Name and Contact of Legal Guardian: No data recorded If Minor and Not Living with Parent(s), Who has Custody? NA  Is CPS involved or ever been involved? Never  Is APS involved or ever been involved? Never   Patient Determined To Be At Risk for Harm To Self or Others Based on Review of Patient Reported Information or Presenting Complaint? Yes, for Self-Harm  Method: No Plan  Availability of Means: No access or NA  Intent: Vague intent or NA  Notification Required: No need or identified person  Additional Information for Danger to Others Potential: -- (NA)  Additional Comments for Danger to Others Potential: NA  Are There Guns or Other Weapons in Your Home? No  Types of Guns/Weapons: No data recorded Are These Weapons  Safely Secured?                            No data recorded Who Could Verify You Are Able To Have These Secured: No data recorded Do You Have any Outstanding Charges, Pending Court Dates, Parole/Probation? None noted  Contacted To Inform of Risk of Harm To Self or Others: Other: Comment (NA)    Does Patient Present under Involuntary Commitment? No  IVC Papers Initial File Date: No data recorded  [place]   Patient Currently Receiving the Following Services: Not Receiving Services   Determination of Need: Urgent (48 hours)   Options For Referral: Outpatient Therapy     CCA Biopsychosocial Patient Reported Schizophrenia/Schizoaffective Diagnosis in Past: No   Strengths: motivated towards treatment   Mental Health Symptoms Depression:   Hopelessness; Worthlessness; Change in energy/activity   Duration of Depressive symptoms:    Mania:   None   Anxiety:    Tension; Worrying   Psychosis:   None   Duration of Psychotic symptoms:    Trauma:   None   Obsessions:   None   Compulsions:   None   Inattention:  None   Hyperactivity/Impulsivity:   N/A   Oppositional/Defiant Behaviors:   N/A   Emotional Irregularity:   Mood lability   Other Mood/Personality Symptoms:  No data recorded   Mental Status Exam Appearance and self-care  Stature:   Average   Weight:   Average weight   Clothing:   Casual   Grooming:   Normal   Cosmetic use:   None   Posture/gait:   Normal   Motor activity:   Not Remarkable   Sensorium  Attention:   Normal   Concentration:   Normal   Orientation:   X5   Recall/memory:   Normal   Affect and Mood  Affect:   Flat   Mood:   Depressed; Hopeless   Relating  Eye contact:   Normal   Facial expression:   Depressed; Sad   Attitude toward examiner:   Cooperative   Thought and Language  Speech flow:  Clear and Coherent   Thought content:   Appropriate to Mood and  Circumstances   Preoccupation:   Suicide   Hallucinations:   None   Organization:  No data recorded  [employer] of Knowledge:   Average   Intelligence:   Average   Abstraction:   Normal   Judgement:   Impaired   Reality Testing:   Adequate   Insight:   Fair   Decision Making:   Impulsive   Social Functioning  Social Maturity:   Responsible   Social Judgement:   Normal   Stress  Stressors:   Relationship; Work   Coping Ability:   Deficient supports; Overwhelmed   Skill Deficits:   Interpersonal; Self-control   Supports:   Family     Religion: Religion/Spirituality Are You A Religious Person?: No  Leisure/Recreation: Leisure / Recreation Do You Have Hobbies?: Yes  Exercise/Diet: Exercise/Diet Do You Exercise?: No Have You Gained or Lost A Significant Amount of Weight in the Past Six Months?: No Do You Follow a Special Diet?: No Do You Have Any Trouble Sleeping?: No   CCA Employment/Education Employment/Work Situation: Employment / Work Situation Employment Situation: Unemployed Patient's Job has Been Impacted by Current Illness: No Has Patient ever Been in Equities trader?: No  Education: Education Last Grade Completed:  (UTA) Did You Attend College?:  (UTA) Did You Have An Individualized Education Program (IIEP): No Did You Have Any Difficulty At School?: No   CCA Family/Childhood History Family and Relationship History: Family history Marital status: Single Does patient have children?: No  Childhood History:  Childhood History By whom was/is the patient raised?: Both parents Did patient suffer any verbal/emotional/physical/sexual abuse as a child?: No Did patient suffer from severe childhood neglect?: No Has patient ever been sexually abused/assaulted/raped as an adolescent or adult?: No Was the patient ever a victim of a crime or a disaster?: No Witnessed domestic violence?: No Has patient been affected by  domestic violence as an adult?: No  Child/Adolescent Assessment:     CCA Substance Use Alcohol/Drug Use: Alcohol / Drug Use Pain Medications: None Prescriptions: None Over the Counter: See MAR History of alcohol / drug use?: Yes Longest period of sobriety (when/how long): Patient reports using meth at one point and current THC occasional use Substance #1 Name of Substance 1: Amphetamines per UDS 1 - Age of First Use: UTA 1 - Amount (size/oz): UTA 1 - Frequency: UTA 1 - Duration: UTA 1 - Last Use / Amount: UTA 1 - Method of Aquiring: UTA 1- Route of  Use: UTA                       ASAM's:  Six Dimensions of Multidimensional Assessment  Dimension 1:  Acute Intoxication and/or Withdrawal Potential:   Dimension 1:  Description of individual's past and current experiences of substance use and withdrawal: 1  Dimension 2:  Biomedical Conditions and Complications:   Dimension 2:  Description of patient's biomedical conditions and  complications: 1  Dimension 3:  Emotional, Behavioral, or Cognitive Conditions and Complications:  Dimension 3:  Description of emotional, behavioral, or cognitive conditions and complications: 2  Dimension 4:  Readiness to Change:  Dimension 4:  Description of Readiness to Change criteria: 2  Dimension 5:  Relapse, Continued use, or Continued Problem Potential:  Dimension 5:  Relapse, continued use, or continued problem potential critiera description: 2  Dimension 6:  Recovery/Living Environment:  Dimension 6:  Recovery/Iiving environment criteria description: 2  ASAM Severity Score: ASAM's Severity Rating Score: 10  ASAM Recommended Level of Treatment:     Substance use Disorder (SUD)    Recommendations for Services/Supports/Treatments:    Discharge Disposition:    DSM5 Diagnoses: Patient Active Problem List   Diagnosis Date Noted   Cocaine use disorder, mild, abuse (HCC) 01/27/2020   Marijuana abuse 01/27/2020   Bipolar I disorder,  current episode depressed (HCC) 01/25/2020     Referrals to Alternative Service(s): Referred to Alternative Service(s):   Place:   Date:   Time:    Referred to Alternative Service(s):   Place:   Date:   Time:    Referred to Alternative Service(s):   Place:   Date:   Time:    Referred to Alternative Service(s):   Place:   Date:   Time:     Alfredia Ferguson, LCAS

## 2020-09-22 NOTE — ED Notes (Signed)
Pt. Belongings inventoried and put in the purple zone. All locker where occupied. patients belongs where placed on the shelf with labels on all three bags and the belongs with security paper was placed in the bag.

## 2020-09-22 NOTE — ED Notes (Signed)
Denies any SI/ HI or hallucinations at this time. Pt ambulated to restroom. And drink provided per pt request at this time

## 2020-09-22 NOTE — ED Provider Notes (Signed)
[facility] EMERGENCY DEPARTMENT Provider Note   CSN: [id number] Arrival date & time: 09/21/20  2002     History Chief Complaint  Patient presents with   Hallucinations    Brett House is a 35 y.o. male.  The history is provided by the patient and medical records. No language interpreter was used.   35 year old male significant history of bipolar, anxiety, depression, asthma, who presents with complaints of hallucination and suicidal ideation.  Patient report for the past 6 months.  He has had progressive worsening auditory and visual hallucination.  For the past several days he also endorsed feeling suicidal because it seems like no one believes or acknowledges hallucination.  He does not have any specific plan and denies any homicidal ideation.  He feels depressed.  He is eating and sleeping less.  He denies self-medicating with drugs or alcohol.  He is currently not on any antipsychotic medication.  He does have family history of schizophrenia.  He is here requesting for mental help.  He denies any active pain.  Past Medical History:  Diagnosis Date   Anxiety    Asthma    Bipolar disorder [facility])    Depression     Patient Active Problem List   Diagnosis Date Noted   Cocaine use disorder, mild, abuse (HCC) 01/27/2020   Marijuana abuse 01/27/2020   Bipolar I disorder, current episode depressed (HCC) 01/25/2020    History reviewed. No pertinent surgical history.     History reviewed. No pertinent family history.  Social History   Tobacco Use   Smoking status: Every Day    Packs/day: 1.50    Years: 15.00    Pack years: 22.50    Types: Cigarettes   Smokeless tobacco: Current  Vaping Use   Vaping Use: Never used  Substance Use Topics   Alcohol use: Yes    Comment: 6 pack once a week for last 8-9 months   Drug use: Yes    Types: Marijuana    Comment: rarely    Home Medications Prior to Admission medications   Medication Sig Start Date End Date  Taking? Authorizing Provider  lithium carbonate (ESKALITH) 450 MG CR tablet Take 1 tablet (450 mg total) by mouth every 12 (twelve) hours. 01/27/20 02/26/20  Lauro Franklin, MD  nicotine (NICODERM CQ - DOSED IN MG/24 HOURS) 21 mg/24hr patch Place 1 patch (21 mg total) onto the skin daily. 01/27/20 01/26/21  Lauro Franklin, MD  QUEtiapine (SEROQUEL) 100 MG tablet Take 1 tablet (100 mg total) by mouth at bedtime. 01/27/20 02/26/20  Lauro Franklin, MD    Allergies    Erythromycin  Review of Systems   Review of Systems  All other systems reviewed and are negative.  Physical Exam Updated Vital Signs BP 135/88   Pulse 88   Temp 98.6 F (37 C)   Resp 17   Ht 5\' 10"  (1.778 m)   Wt 84.8 kg   SpO2 98%   BMI 26.83 kg/m   Physical Exam Vitals and nursing note reviewed.  Constitutional:      General: He is not in acute distress.    Appearance: He is well-developed.  HENT:     Head: Atraumatic.  Eyes:     Conjunctiva/sclera: Conjunctivae normal.  Cardiovascular:     Rate and Rhythm: Normal rate and regular rhythm.     Pulses: Normal pulses.     Heart sounds: Normal heart sounds.  Pulmonary:     Effort: Pulmonary  effort is normal.     Breath sounds: Normal breath sounds. No wheezing or rales.  Abdominal:     Palpations: Abdomen is soft.     Tenderness: There is no abdominal tenderness.  Musculoskeletal:        General: Normal range of motion.     Cervical back: Neck supple.  Skin:    Findings: No rash.  Neurological:     Mental Status: He is alert.     GCS: GCS eye subscore is 4. GCS verbal subscore is 5. GCS motor subscore is 6.  Psychiatric:        Attention and Perception: Attention normal.        Mood and Affect: Mood is depressed.        Speech: Speech normal.        Behavior: Behavior is cooperative.        Thought Content: Thought content includes suicidal ideation. Thought content does not include homicidal ideation.    ED Results /  Procedures / Treatments   Labs (all labs ordered are listed, but only abnormal results are displayed) Labs Reviewed  COMPREHENSIVE METABOLIC PANEL - Abnormal; Notable for the following components:      Result Value   Glucose, Bld 111 (*)    All other components within normal limits  SALICYLATE LEVEL - Abnormal; Notable for the following components:   Salicylate Lvl <7.0 (*)    All other components within normal limits  ACETAMINOPHEN LEVEL - Abnormal; Notable for the following components:   Acetaminophen (Tylenol), Serum <10 (*)    All other components within normal limits  RESP PANEL BY RT-PCR (FLU A&B, COVID) ARPGX2  ETHANOL  CBC  RAPID URINE DRUG SCREEN, HOSP PERFORMED  URINALYSIS, ROUTINE W REFLEX MICROSCOPIC    EKG None  Date: 09/22/2020  Rate: 64  Rhythm: normal sinus rhythm  QRS Axis: normal  Intervals: normal  ST/T Wave abnormalities: normal  Conduction Disutrbances: none  Narrative Interpretation: ST elevation 2/2 early repol  Old EKG Reviewed: No significant changes noted    Radiology No results found.  Procedures Procedures   Medications Ordered in ED Medications - No data to display  ED Course  I have reviewed the triage vital signs and the nursing notes.  Pertinent labs & imaging results that were available during my care of the patient were reviewed by me and considered in my medical decision making (see chart for details).    MDM Rules/Calculators/A&P                           BP 113/60 (BP Location: Right Arm)   Pulse 83   Temp 98.6 F (37 C)   Resp 18   Ht 5\' 10"  (1.778 m)   Wt 84.8 kg   SpO2 98%   BMI 26.83 kg/m   Final Clinical Impression(s) / ED Diagnoses Final diagnoses:  Brief psychotic disorder (HCC)  Suicidal ideation    Rx / DC Orders ED Discharge Orders     None      8:03 AM Patient is is being psychosis which include both auditory and visual hallucination for the past several months.  No command hallucination.   Now endorsing vague suicidal ideation without any specific plan.  He is here requesting for help.  He is here voluntarily.  No other complaint.  Will consult TTS for further psychiatric management.  Patient otherwise medically cleared   , PA-C 09/22/20 1148  Arby Barrette, MD 09/27/20 [phone]

## 2020-09-22 NOTE — ED Notes (Signed)
Pt ambulatory to restroom. Sitter remains with pt.

## 2020-09-22 NOTE — Progress Notes (Signed)
Per Vivien Presto, patient meets criteria for inpatient treatment. There are no available or appropriate beds at [facility] today. CSW faxed referrals to the following facilities for review:   Service Provider Request Status Selected Services Address Phone Fax Patient Preferred  [facility]  Pending - Request Sent N/A [address] [place] [address] [phone] --  [facility]  Pending - Request Sent N/A [address] [place] [address] [phone] --  [facility]  Pending - Request Sent N/A [address] [place], [place] [address] [phone] --  [facility]  Pending - Request Sent N/A [address] [place] [address] [phone] --  [facility]  Pending - Request Sent N/A [address] [place] [address] [phone] --  [facility]  Pending - Request Sent N/A [address] [place] [address] [phone] --  [facility]  Pending - Request Sent N/A [address] [place] [address] [phone] --  [facility]  Pending - Request Sent N/A [address] [place] [address] [phone] --  [facility]  Pending - Request Sent N/A [address] [place] [address] [phone] --  [facility]  Pending - Request Sent N/A [address] [place] [address] [phone] --  [facility]  Pending - Request Sent N/A [address] [place] [address] [phone]     TTS will continue to seek bed placement.  Crissie Reese, MSW, LCSW-A, LCAS-A Phone: [phone] Disposition/TOC

## 2020-09-23 ENCOUNTER — Emergency Department (HOSPITAL_COMMUNITY): Payer: Self-pay

## 2020-09-23 LAB — CBC
HCT: 43.4 % (ref 39.0–52.0)
Hemoglobin: 14.8 g/dL (ref 13.0–17.0)
MCH: 30.6 pg (ref 26.0–34.0)
MCHC: 34.1 g/dL (ref 30.0–36.0)
MCV: 89.7 fL (ref 80.0–100.0)
Platelets: 291 10*3/uL (ref 150–400)
RBC: 4.84 MIL/uL (ref 4.22–5.81)
RDW: 12.4 % (ref 11.5–15.5)
WBC: 9.6 10*3/uL (ref 4.0–10.5)
nRBC: 0 % (ref 0.0–0.2)

## 2020-09-23 LAB — COMPREHENSIVE METABOLIC PANEL
ALT: 11 U/L (ref 0–44)
AST: 13 U/L — ABNORMAL LOW (ref 15–41)
Albumin: 4 g/dL (ref 3.5–5.0)
Alkaline Phosphatase: 49 U/L (ref 38–126)
Anion gap: 8 (ref 5–15)
BUN: 13 mg/dL (ref 6–20)
CO2: 25 mmol/L (ref 22–32)
Calcium: 9.2 mg/dL (ref 8.9–10.3)
Chloride: 105 mmol/L (ref 98–111)
Creatinine, Ser: 0.91 mg/dL (ref 0.61–1.24)
GFR, Estimated: >60 mL/min (ref >60)
Glucose, Bld: 99 mg/dL (ref 70–99)
Potassium: 4.1 mmol/L (ref 3.5–5.1)
Sodium: 138 mmol/L (ref 135–145)
Total Bilirubin: 0.7 mg/dL (ref 0.3–1.2)
Total Protein: 6.2 g/dL — ABNORMAL LOW (ref 6.5–8.1)

## 2020-09-23 LAB — I-STAT CHEM 8, ED
BUN: 14 mg/dL (ref 6–20)
Calcium, Ion: 1.22 mmol/L (ref 1.15–1.40)
Chloride: 105 mmol/L (ref 98–111)
Creatinine, Ser: 0.8 mg/dL (ref 0.61–1.24)
Glucose, Bld: 96 mg/dL (ref 70–99)
HCT: 42 % (ref 39.0–52.0)
Hemoglobin: 14.3 g/dL (ref 13.0–17.0)
Potassium: 3.9 mmol/L (ref 3.5–5.1)
Sodium: 140 mmol/L (ref 135–145)
TCO2: 26 mmol/L (ref 22–32)

## 2020-09-23 LAB — CBG MONITORING, ED: Glucose-Capillary: 100 mg/dL — ABNORMAL HIGH (ref 70–99)

## 2020-09-23 LAB — TROPONIN I (HIGH SENSITIVITY): Troponin I (High Sensitivity): 3 ng/L (ref <18)

## 2020-09-23 LAB — MAGNESIUM: Magnesium: 2.3 mg/dL (ref 1.7–2.4)

## 2020-09-23 LAB — LITHIUM LEVEL: Lithium Lvl: 0.39 mmol/L — ABNORMAL LOW (ref 0.60–1.20)

## 2020-09-23 LAB — TSH: TSH: 1.957 u[IU]/mL (ref 0.350–4.500)

## 2020-09-23 LAB — PHOSPHORUS: Phosphorus: 3.5 mg/dL (ref 2.5–4.6)

## 2020-09-23 MED ORDER — LACTATED RINGERS IV BOLUS
1000.0000 mL | Freq: Once | INTRAVENOUS | Status: AC
  Administered 2020-09-23: 1000 mL via INTRAVENOUS

## 2020-09-23 MED ORDER — ACETAMINOPHEN 500 MG PO TABS
1000.0000 mg | ORAL_TABLET | Freq: Once | ORAL | Status: AC
  Administered 2020-09-23: 1000 mg via ORAL
  Filled 2020-09-23: qty 2

## 2020-09-23 NOTE — ED Notes (Signed)
Pt taking a shower 

## 2020-09-23 NOTE — ED Notes (Signed)
TTS in progress/ beginning.

## 2020-09-23 NOTE — ED Notes (Signed)
Ambulated to restroom at this time with steady gait 

## 2020-09-23 NOTE — ED Notes (Signed)
Pt was coming out of the bathroom and heading back towards his room when he was noted to be pale and had syncopal episode. Pt fell slowly to floor w/ impact head. Pt still pale upon assessment c/o unilateral arm and leg pain. VS obtained: 147/134 (134), HR 45, 100% RA. MD notified and at pt side. Instructed to help pt up to stretcher. Pt assisted onto stretcher and taken to his room where he was hooked up to monitoring equipment. IV's started. Blood obtained. EKG obtained and given to [facility] MD.

## 2020-09-23 NOTE — Progress Notes (Signed)
Per Vivien Presto, patient meets criteria for inpatient treatment. There are no available or appropriate beds at [facility] today. CSW faxed referrals to the following facilities for review:  [facility]  Pending - Request Sent N/A [address] [place] [address] [phone] --  [facility]  Pending - Request Sent N/A [address] [place] [address] [phone] --  [facility]  Pending - Request Sent N/A [address] [place], [place] [address] [phone] --  [facility]  Pending - Request Sent N/A [address] [place] [address] [phone] --  [facility]  Pending - Request Sent N/A [address], [place] [address] [phone] --  [facility]  Pending - Request Sent N/A [address] [place] [address] [phone] --  [facility]  Pending - Request Sent N/A [address], [place] [address] [phone] --  [facility]  Pending - Request Sent N/A [address] [place] [address] [phone] --  [facility]  Pending - Request Sent N/A [address], [place] [address] [phone] --  [facility]  Pending - Request Sent N/A [address] [place] [address] [phone] --  [facility]  Pending - Request Sent N/A [address] [place] [address] [phone] --   TTS will continue to seek bed placement.  Crissie Reese, MSW, LCSW-A, LCAS-A Phone: [phone] Disposition/TOC
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
## 2020-09-23 NOTE — ED Notes (Signed)
Spoke with provider reference same. Contacted pharmacy to send medication

## 2020-09-23 NOTE — ED Notes (Signed)
Pt requesting something to help him sleep advised would need to speak with provider reference same

## 2020-09-23 NOTE — ED Notes (Signed)
TTS connection issues. EDP in to see.

## 2020-09-23 NOTE — ED Provider Notes (Signed)
Emergency Medicine Observation Re-evaluation Note  Brett House is a 35 y.o. male, seen on rounds today.  Pt initially presented to the ED for complaints of Hallucinations Currently, the patient is awaiting TTS assessment for admission.  Nursing came and advised me that the patient had a syncopal episode.  He had uneventful night.  It went to the bathroom independently.  He ambulated without difficulty.  He left the bathroom and came out, he became very pale and started to collapse to the ground.  Nursing reports before they could get to him he did fall and strike his head on the floor.  He did not have any laceration or bleeding.  As I came to evaluate the patient he is still supine on the floor pale but awake and answering questions.  No respiratory distress.  Reports he just got in the bathroom and then when he came out he got extremely lightheaded and fell.  No preceding chest pain or headache.  Proportionality fell he does have some low back pain and pain on the lateral aspect of his right thigh.  Denies history of syncopal episodes.  Physical Exam  BP (!) 90/49   Pulse 67   Temp 97.8 F (36.6 C) (Oral)   Resp 17   Ht 5\' 10"  (1.778 m)   Wt 74.8 kg   SpO2 99%   BMI 23.68 kg/m  Physical Exam Constitutional:      Comments: On initial assessment patient was pale and supine.  Awake and answering questions.  No respiratory distress.  HENT:     Head: Normocephalic and atraumatic.     Comments: No lacerations or hematomas to the scalp.    Nose: Nose normal.     Mouth/Throat:     Mouth: Mucous membranes are moist.     Pharynx: Oropharynx is clear.  Eyes:     Extraocular Movements: Extraocular movements intact.     Pupils: Pupils are equal, round, and reactive to light.  Cardiovascular:     Pulses: Normal pulses.     Comments: Bradycardic in the 50s no rub murmur gallop. Pulmonary:     Effort: No respiratory distress.     Breath sounds: Normal breath sounds. No wheezing or rales.   Abdominal:     General: There is no distension.     Palpations: Abdomen is soft.     Tenderness: There is no abdominal tenderness. There is no guarding.  Musculoskeletal:        General: No swelling, tenderness or deformity. Normal range of motion.     Cervical back: Neck supple.     Right lower leg: No edema.     Left lower leg: No edema.     Comments: No deformities of extremities.  Some tenderness at the lateral right hip and thigh.  Skin:    General: Skin is warm and dry.     Coloration: Skin is pale.  Neurological:     General: No focal deficit present.     Mental Status: He is oriented to person, place, and time.     Cranial Nerves: No cranial nerve deficit.     Coordination: Coordination normal.    ED Course / MDM  EKG:EKG Interpretation  Date/Time:  Sunday September 23 2020 07:21:04 EDT Ventricular Rate:  42 PR Interval:  138 QRS Duration: 81 QT Interval:  471 QTC Calculation: 394 R Axis:   73 Text Interpretation: Sinus bradycardia bradycardia no sig change from previous Confirmed by [id number] [phone]) on 09/23/2020 7:23:39  AM CRITICAL CARE Performed by: Arby Barrette   Total critical care time: 30 minutes  Critical care time was exclusive of separately billable procedures and treating other patients.  Critical care was necessary to treat or prevent imminent or life-threatening deterioration.  Critical care was time spent personally by me on the following activities: development of treatment plan with patient and/or surrogate as well as nursing, discussions with consultants, evaluation of patient's response to treatment, examination of patient, obtaining history from patient or surrogate, ordering and performing treatments and interventions, ordering and review of laboratory studies, ordering and review of radiographic studies, pulse oximetry and re-evaluation of patient's condition.   Plan  .  Brett House is not under involuntary commitment. Patient was  awaiting TTS evaluation he did not have any reportable symptoms or episodes overnight.  He did however get a dose of Geodon upon his initial presentation and had a dose of scheduled risperdal and lithium.  Patient reports he had not been on any medication on outpatient basis.  He had lithium in the past but had not been taking it prior to getting it administered in the emergency department.  His EKG did not show any QT prolongation.  His EKG after the event was a sinus bradycardia in the 50s no change from previous EKG except for rate.  No other significant change to chemistries.  CT head negative for any acute findings.  Patient was hypotensive in the 90s over 60s after the episode.  With fluid resuscitation color improved and blood pressures improved.  Consideration given to orthostatic syncope with combination of Geodon and respite all and patient was not receiving these medications regularly.  Clinically patient does not show signs of being ill or septic.  No signs of stroke or MI.  At this time patient is again medically cleared.  Would recommend limited use of combination antipsychotic medications with potential for orthostatic hypotension in this patient.     Arby Barrette, MD 09/23/20 [phone]

## 2020-09-23 NOTE — ED Notes (Signed)
Initiating TTS. Soft drink given. Sleeping, arousable to voice. Alert, NAD, calm, interactive. VSS. Sitter present.

## 2020-09-23 NOTE — ED Notes (Addendum)
Back from CT, returned to monitor, VSS, alert, NAD, calm, interactive, xray present. Sitter remains present. Pt laughing at joke, verbalizes back, leg, and arm no longer hurt. Admits posterior head, and R hip still ache, 7/10.

## 2020-09-23 NOTE — ED Notes (Signed)
EDP at [facility], IVF infusing, pt alert, NAD, calm, interactive, pale, skin W&D, resps e/u, speaking in clear statements, color improving, VSS, to CT.

## 2020-09-23 NOTE — ED Notes (Addendum)
Pt wrote down 3 pages of his thoughts as he felt he couldn't express his feelings while completing his TTS. Papers placed in chart, Danny at [facility] aware, papers to go w/ pt to inpatient tx center.

## 2020-09-24 ENCOUNTER — Encounter (HOSPITAL_COMMUNITY): Payer: Self-pay | Admitting: Registered Nurse

## 2020-09-24 DIAGNOSIS — F319 Bipolar disorder, unspecified: Secondary | ICD-10-CM

## 2020-09-24 DIAGNOSIS — F1210 Cannabis abuse, uncomplicated: Secondary | ICD-10-CM

## 2020-09-24 DIAGNOSIS — F1410 Cocaine abuse, uncomplicated: Secondary | ICD-10-CM

## 2020-09-24 DIAGNOSIS — R45851 Suicidal ideations: Secondary | ICD-10-CM

## 2020-09-24 DIAGNOSIS — F23 Brief psychotic disorder: Secondary | ICD-10-CM

## 2020-09-24 LAB — T3, FREE: T3, Free: 3.3 pg/mL (ref 2.0–4.4)

## 2020-09-24 MED ORDER — OLANZAPINE 5 MG PO TABS
5.0000 mg | ORAL_TABLET | Freq: Every day | ORAL | Status: DC
  Administered 2020-09-25: 5 mg via ORAL
  Filled 2020-09-24 (×3): qty 1

## 2020-09-24 MED ORDER — HYDROXYZINE HCL 25 MG PO TABS
25.0000 mg | ORAL_TABLET | Freq: Once | ORAL | Status: AC
  Administered 2020-09-24: 25 mg via ORAL
  Filled 2020-09-24: qty 1

## 2020-09-24 MED ORDER — HYDROXYZINE HCL 25 MG PO TABS
25.0000 mg | ORAL_TABLET | Freq: Three times a day (TID) | ORAL | Status: DC | PRN
  Administered 2020-09-24 – 2020-09-25 (×2): 25 mg via ORAL
  Filled 2020-09-24 (×2): qty 1

## 2020-09-24 MED ORDER — LORAZEPAM 1 MG PO TABS
1.0000 mg | ORAL_TABLET | Freq: Once | ORAL | Status: AC
  Administered 2020-09-24: 1 mg via ORAL
  Filled 2020-09-24: qty 1

## 2020-09-24 MED ORDER — OLANZAPINE 5 MG PO TBDP
5.0000 mg | ORAL_TABLET | Freq: Once | ORAL | Status: AC | PRN
  Administered 2020-09-24: 5 mg via ORAL
  Filled 2020-09-24: qty 1

## 2020-09-24 NOTE — ED Provider Notes (Signed)
Emergency Medicine Observation Re-evaluation Note  Brett House is a 35 y.o. male, seen on rounds today.  Pt initially presented to the ED for complaints of Hallucinations Currently, the patient is resting comfortably.  Physical Exam  BP 131/74 (BP Location: Right Arm)   Pulse 85   Temp 98.1 F (36.7 C)   Resp (!) 2   Ht 5\' 10"  (1.778 m)   Wt 74.8 kg   SpO2 96%   BMI 23.68 kg/m  Physical Exam General: Nontender Cardiac: Normal heart rate Lungs: No respiratory distress Psych: Calm  ED Course / MDM  EKG:EKG Interpretation  Date/Time:  Sunday September 23 2020 07:21:04 EDT Ventricular Rate:  42 PR Interval:  138 QRS Duration: 81 QT Interval:  471 QTC Calculation: 394 R Axis:   73 Text Interpretation: Sinus bradycardia bradycardia no sig change from previous Confirmed by [email or web address] [phone]) on 09/23/2020 7:23:39 AM Also confirmed by 09/25/2020 [phone]), editor ([phone])  on 09/24/2020 9:33:28 AM  I have reviewed the labs performed to date as well as medications administered while in observation.  Recent changes in the last 24 hours include vasovagal episode yesterday, not recurrent..  Vitals has been stable.  Plan  Current plan is for psychiatric admission.  Brett House is not under involuntary commitment.     [name], MD 09/24/20 [phone]

## 2020-09-24 NOTE — BH Assessment (Addendum)
Disposition:   @ 1318, day time LCSW noted: "Patient information has been sent to [facility] via secure chat to review for potential admission. Patient meets inpatient criteria per Ophelia Shoulder, NP".   Shuvon Rankin, NP, recommends inpatient psychiatric treatment. @ 2005, Followed up with night time [facility] Everardo Pacific, RN), requested patient to reviewed for admission to [facility].   @2108 , received notice from [facility], RN), that patient would need updated COVID results for consideration of [facility] bed placement. Notified patient's nurse's [facility], RN and Elliot Gurney, Scarlette Calico).   @2321 , notified patient's nurse Charity fundraiser, RN) of the [facility]'s request for update COVID results.

## 2020-09-24 NOTE — ED Notes (Signed)
Patient asking for something to help sleep. Calm and cooperative.

## 2020-09-24 NOTE — ED Notes (Signed)
TTS in use at this time. 

## 2020-09-24 NOTE — Progress Notes (Signed)
Patient information has been sent to [facility] via secure chat to review for potential admission. Patient meets inpatient criteria per Ophelia Shoulder, NP.   Situation ongoing, CSW will continue to monitor progress.    Signed:  Damita Dunnings, MSW, LCSW-A  09/24/2020 1:18 PM

## 2020-09-24 NOTE — Consult Note (Addendum)
Telepsych Consultation   Reason for Consult: Suicidal ideation Referring Physician:  Theron Arista, PA-C Location of Patient: [facility] Location of Provider: Other: [facility]  Patient Identification: Brett House MRN:  [id number] Principal Diagnosis: Bipolar I disorder, current episode depressed (HCC) Diagnosis:  Principal Problem:   Bipolar I disorder, current episode depressed (HCC) Active Problems:   Cocaine use disorder, mild, abuse (HCC)   Marijuana abuse   Total Time spent with patient: 30 minutes  Subjective:   Brett House is a 35 y.o. male patient admitted to [facility] ED after presenting with complaints of auditory hallucinations.  HPI:  Brett House, 35 y.o., male patient seen via tele health by this provider, consulted with Dr. Nelly Rout; and chart reviewed on 09/24/20.  On evaluation Brett House reports he came to the hospital because he was hearing voices and he was not getting any better.  Patient states it was also difficult not to carry out actions that the voices were telling him to do.  Would not elaborate on what voices were saying.  Patient reported he lives alone but has support of his family to help pay his rent and other utilities right now since he is unemployed.  Patient reports he was diagnosed with manic depression about 8 months ago which was his first psychiatric hospitalization.  Patient denies that he has outpatient psychiatric services or taking any psychotropic medication at this time.  Reports he has been on psychiatric medication in the past but nothing has worked for him patient continues to endorse suicidal ideation and is unable to contract for safety.  Patient also endorses that he is having auditory hallucinations that are command but no elaboration on what voices are saying.  Patient states that he also has paranoia that he feels people are talking about him and watching him.  Patient unable to contract for safety we will continue to recommend inpatient psychiatric  services for patient   Past Psychiatric History: Substance abuse, MDD  Risk to Self:   Risk to Others:   Prior Inpatient Therapy:   Prior Outpatient Therapy:    Past Medical History:  Past Medical History:  Diagnosis Date   Anxiety    Asthma    Bipolar disorder (HCC)    Depression    History reviewed. No pertinent surgical history. Family History: History reviewed. No pertinent family history. Family Psychiatric  History: Unaware Social History:  Social History   Substance and Sexual Activity  Alcohol Use Yes   Comment: 6 pack once a week for last 8-9 months     Social History   Substance and Sexual Activity  Drug Use Yes   Types: Marijuana   Comment: rarely    Social History   Socioeconomic History   Marital status: Single    Spouse name: Not on file   Number of children: 0   Years of education: Not on file   Highest education level: 12th grade  Occupational History   Occupation: Unemployed  Tobacco Use   Smoking status: Every Day    Packs/day: 1.50    Years: 15.00    Pack years: 22.50    Types: Cigarettes   Smokeless tobacco: Current  Vaping Use   Vaping Use: Never used  Substance and Sexual Activity   Alcohol use: Yes    Comment: 6 pack once a week for last 8-9 months   Drug use: Yes    Types: Marijuana    Comment: rarely   Sexual activity: Yes  Other Topics  Concern   Not on file  Social History Narrative   Not on file   Social Determinants of Health   Financial Resource Strain: Not on file  Food Insecurity: Not on file  Transportation Needs: Not on file  Physical Activity: Not on file  Stress: Not on file  Social Connections: Not on file   Additional Social History:    Allergies:   Allergies  Allergen Reactions   Erythromycin Other (See Comments)    Unknown childhood allergic reaction    Labs:  Results for orders placed or performed during the hospital encounter of 09/21/20 (from the past 48 hour(s))  Lithium level     Status:  Abnormal   Collection Time: 09/23/20  7:20 AM  Result Value Ref Range   Lithium Lvl 0.39 (L) 0.60 - 1.20 mmol/L    Comment: Performed at [facility], [address] [place], [place] [address]  Comprehensive metabolic panel     Status: Abnormal   Collection Time: 09/23/20  7:20 AM  Result Value Ref Range   Sodium 138 135 - 145 mmol/L   Potassium 4.1 3.5 - 5.1 mmol/L   Chloride 105 98 - 111 mmol/L   CO2 25 22 - 32 mmol/L   Glucose, Bld 99 70 - 99 mg/dL    Comment: Glucose reference range applies only to samples taken after fasting for at least 8 hours.   BUN 13 6 - 20 mg/dL   Creatinine, Ser 5.780.91 0.61 - 1.24 mg/dL   Calcium 9.2 8.9 - 46.910.3 mg/dL   Total Protein 6.2 (L) 6.5 - 8.1 g/dL   Albumin 4.0 3.5 - 5.0 g/dL   AST 13 (L) 15 - 41 U/L   ALT 11 0 - 44 U/L   Alkaline Phosphatase 49 38 - 126 U/L   Total Bilirubin 0.7 0.3 - 1.2 mg/dL   GFR, Estimated >62>60 >95>60 mL/min    Comment: (NOTE) Calculated using the CKD-EPI Creatinine Equation (2021)    Anion gap 8 5 - 15    Comment: Performed at [facility], [address] [place], [place] [address]  CBC     Status: None   Collection Time: 09/23/20  7:20 AM  Result Value Ref Range   WBC 9.6 4.0 - 10.5 K/uL   RBC 4.84 4.22 - 5.81 MIL/uL   Hemoglobin 14.8 13.0 - 17.0 g/dL   HCT 24.443.4 01.039.0 - 27.252.0 %   MCV 89.7 80.0 - 100.0 fL   MCH 30.6 26.0 - 34.0 pg   MCHC 34.1 30.0 - 36.0 g/dL   RDW 53.612.4 64.411.5 - 03.415.5 %   Platelets 291 150 - 400 K/uL   nRBC 0.0 0.0 - 0.2 %    Comment: Performed at [facility], [address] [place], [place] [address]  POC CBG, ED     Status: Abnormal   Collection Time: 09/23/20  7:20 AM  Result Value Ref Range   Glucose-Capillary 100 (H) 70 - 99 mg/dL    Comment: Glucose reference range applies only to samples taken after fasting for at least 8 hours.   Comment 1 Notify RN   Troponin I (High Sensitivity)     Status: None   Collection Time: 09/23/20  7:20 AM  Result Value Ref Range   Troponin I  (High Sensitivity) 3 <18 ng/L    Comment: (NOTE) Elevated high sensitivity troponin I (hsTnI) values and significant  changes across serial measurements may suggest ACS but many other  chronic and acute conditions are known to elevate hsTnI results.  Refer to the "Links" section for chest pain algorithms and additional  guidance. Performed at [facility], [address], [place], [place] [address]   Magnesium     Status: None   Collection Time: 09/23/20  7:20 AM  Result Value Ref Range   Magnesium 2.3 1.7 - 2.4 mg/dL    Comment: Performed at [facility], [address], [place], [place] [address]  Phosphorus     Status: None   Collection Time: 09/23/20  7:20 AM  Result Value Ref Range   Phosphorus 3.5 2.5 - 4.6 mg/dL    Comment: Performed at [facility], [address] [place], [place] [address]  TSH     Status: None   Collection Time: 09/23/20  7:22 AM  Result Value Ref Range   TSH 1.957 0.350 - 4.500 uIU/mL    Comment: Performed by a 3rd Generation assay with a functional sensitivity of <=0.01 uIU/mL. Performed at [facility], [address] [place], [place] [address]   T3, free     Status: None   Collection Time: 09/23/20  7:27 AM  Result Value Ref Range   T3, Free 3.3 2.0 - 4.4 pg/mL    Comment: (NOTE) Performed At: [facility] [address] [place], [place] [id number] Jolene Schimke MD OI:[id number]   I-stat chem 8, ED (not at [facility] or [facility])     Status: None   Collection Time: 09/23/20  7:54 AM  Result Value Ref Range   Sodium 140 135 - 145 mmol/L   Potassium 3.9 3.5 - 5.1 mmol/L   Chloride 105 98 - 111 mmol/L   BUN 14 6 - 20 mg/dL   Creatinine, Ser 5.83 0.61 - 1.24 mg/dL   Glucose, Bld 96 70 - 99 mg/dL    Comment: Glucose reference range applies only to samples taken after fasting for at least 8 hours.   Calcium, Ion 1.22 1.15 - 1.40 mmol/L   TCO2 26 22 - 32 mmol/L   Hemoglobin 14.3 13.0 - 17.0 g/dL   HCT 09.4 07.6 - 80.8 %     Medications:  Current Facility-Administered Medications  Medication Dose Route Frequency Provider Last Rate Last Admin   acetaminophen (TYLENOL) tablet 650 mg  650 mg Oral Q4H PRN Fayrene Helper, PA-C   650 mg at 09/24/20 1055   alum & mag hydroxide-simeth (MAALOX/MYLANTA) 200-200-20 MG/5ML suspension 30 mL  30 mL Oral Q6H PRN Fayrene Helper, PA-C       nicotine (NICODERM CQ - dosed in mg/24 hours) patch 21 mg  21 mg Transdermal Daily Fayrene Helper, PA-C   21 mg at 09/24/20 1012   OLANZapine (ZYPREXA) tablet 5 mg  5 mg Oral Daily Shaquasha Gerstel B, NP       ondansetron (ZOFRAN) tablet 4 mg  4 mg Oral Q8H PRN Fayrene Helper, PA-C       Current Outpatient Medications  Medication Sig Dispense Refill   acetaminophen (TYLENOL) 500 MG tablet Take 1,000 mg by mouth every 6 (six) hours as needed for headache (pain).     lithium carbonate (ESKALITH) 450 MG CR tablet Take 1 tablet (450 mg total) by mouth every 12 (twelve) hours. (Patient not taking: Reported on 09/22/2020) 60 tablet 0   nicotine (NICODERM CQ - DOSED IN MG/24 HOURS) 21 mg/24hr patch Place 1 patch (21 mg total) onto the skin daily. (Patient not taking: Reported on 09/22/2020)  30 patch 0   QUEtiapine (SEROQUEL) 100 MG tablet Take 1 tablet (100 mg total) by mouth at bedtime. (Patient not taking: Reported on 09/22/2020) 30 tablet 0    Musculoskeletal: Strength & Muscle Tone: within normal limits Gait & Station: normal Patient leans: N/A   Psychiatric Specialty Exam:  Presentation  General Appearance: Appropriate for Environment; Casual  Eye Contact:Good  Speech:Clear and Coherent; Normal Rate  Speech Volume:Normal  Handedness:Right   Mood and Affect  Mood:Anxious; Depressed  Affect:Congruent   Thought Process  Thought Processes:Coherent; Goal Directed  Descriptions of Associations:Intact  Orientation:Full (Time, Place and Person)  Thought Content:WDL  History of Schizophrenia/Schizoaffective disorder:No  Duration of  Psychotic Symptoms:No data recorded Hallucinations:Hallucinations: Auditory Description of Auditory Hallucinations: Reporting he is hearing whispers but no elaboration on what voices were saying  Ideas of Reference:Paranoia  Suicidal Thoughts:Suicidal Thoughts: Yes, Active SI Active Intent and/or Plan: With Intent; Without Plan  Homicidal Thoughts:Homicidal Thoughts: No   Sensorium  Memory:Immediate Good; Recent Good  Judgment:Fair  Insight:Lacking   Executive Functions  Concentration:Good  Attention Span:Good  Recall:Good  Fund of Knowledge:Good  Language:Good   Psychomotor Activity  Psychomotor Activity:Psychomotor Activity: Normal   Assets  Assets:Communication Skills; Desire for Improvement; Housing; Social Support   Sleep  Sleep:Sleep: Good    Physical Exam: Physical Exam Vitals and nursing note reviewed. Exam conducted with a chaperone present.  Constitutional:      General: He is not in acute distress.    Appearance: Normal appearance. He is not ill-appearing.  Cardiovascular:     Rate and Rhythm: Normal rate.  Pulmonary:     Effort: Pulmonary effort is normal.  Neurological:     Mental Status: He is alert and oriented to person, place, and time.  Psychiatric:        Attention and Perception: He perceives auditory hallucinations.        Mood and Affect: Mood is anxious and depressed.        Speech: Speech normal.        Behavior: Behavior normal. Behavior is cooperative.        Thought Content: Thought content is paranoid. Thought content includes suicidal ideation.        Cognition and Memory: Cognition normal.        Judgment: Judgment is impulsive.   Review of Systems  Constitutional: Negative.   HENT: Negative.    Eyes: Negative.   Respiratory: Negative.    Cardiovascular: Negative.   Gastrointestinal: Negative.   Genitourinary: Negative.   Musculoskeletal: Negative.   Skin: Negative.   Neurological: Negative.    Endo/Heme/Allergies: Negative.   Psychiatric/Behavioral:  Positive for depression, hallucinations and suicidal ideas. The patient is nervous/anxious and has insomnia.   Blood pressure 131/74, pulse 85, temperature 98.1 F (36.7 C), resp. rate (!) 2, height 5\' 10"  (1.778 m), weight 74.8 kg, SpO2 96 %. Body mass index is 23.68 kg/m.  Treatment Plan Summary: Daily contact with patient to assess and evaluate symptoms and progress in treatment, Medication management, and Plan Inpatient psychiatric treatment  Medication management: Continue Zyprexa Zydis 5 mg Q hs Start Vistaril 25 mg Tid prn anxiety  Disposition: Recommend psychiatric Inpatient admission when medically cleared.  This service was provided via telemedicine using a 2-way, interactive audio and video technology.  Names of all persons participating in this telemedicine service and their role in this encounter. Name: Role: NP  Name: Dr. Assunta Found Role: Psychiatrist  Name: Nelly Rout Role: Patient  Name: [name]  Dionisio David, RN and Aletta Edouard, RN Role: Patients nurse sent a secure message informing: Psychiatric consult complete; patient continues to need inpatient psychiatric treatment; will continue to look for bed availability.  Fax out if no beds at [facility] Brett Vantol, NP 09/24/2020 5:05 PM

## 2020-09-24 NOTE — ED Notes (Signed)
Pt struggling to calm down and rest after comotion on the unit. Provider ordered medication.   RN received notice from [facility] that he needs a new COVID test. Pt agreeable, RN will retest pt.

## 2020-09-25 ENCOUNTER — Inpatient Hospital Stay (HOSPITAL_COMMUNITY)
Admission: AD | Admit: 2020-09-25 | Discharge: 2020-10-01 | DRG: 885 | Disposition: A | Discharge status: Home / Self Care | Payer: Federal, State, Local not specified - Other | Source: Intra-hospital | Attending: Psychiatry | Admitting: Psychiatry

## 2020-09-25 ENCOUNTER — Other Ambulatory Visit: Payer: Self-pay

## 2020-09-25 ENCOUNTER — Encounter (HOSPITAL_COMMUNITY): Payer: Self-pay | Admitting: Psychiatry

## 2020-09-25 DIAGNOSIS — F419 Anxiety disorder, unspecified: Secondary | ICD-10-CM | POA: Diagnosis present

## 2020-09-25 DIAGNOSIS — G4700 Insomnia, unspecified: Secondary | ICD-10-CM | POA: Diagnosis present

## 2020-09-25 DIAGNOSIS — Z560 Unemployment, unspecified: Secondary | ICD-10-CM | POA: Diagnosis not present

## 2020-09-25 DIAGNOSIS — Z9151 Personal history of suicidal behavior: Secondary | ICD-10-CM | POA: Diagnosis not present

## 2020-09-25 DIAGNOSIS — F315 Bipolar disorder, current episode depressed, severe, with psychotic features: Secondary | ICD-10-CM

## 2020-09-25 DIAGNOSIS — F3130 Bipolar disorder, current episode depressed, mild or moderate severity, unspecified: Secondary | ICD-10-CM | POA: Diagnosis present

## 2020-09-25 DIAGNOSIS — R45850 Homicidal ideations: Secondary | ICD-10-CM | POA: Diagnosis present

## 2020-09-25 DIAGNOSIS — J45909 Unspecified asthma, uncomplicated: Secondary | ICD-10-CM | POA: Diagnosis present

## 2020-09-25 DIAGNOSIS — F319 Bipolar disorder, unspecified: Secondary | ICD-10-CM | POA: Diagnosis present

## 2020-09-25 DIAGNOSIS — Z20822 Contact with and (suspected) exposure to covid-19: Secondary | ICD-10-CM | POA: Diagnosis present

## 2020-09-25 DIAGNOSIS — F17210 Nicotine dependence, cigarettes, uncomplicated: Secondary | ICD-10-CM | POA: Diagnosis present

## 2020-09-25 DIAGNOSIS — R45851 Suicidal ideations: Secondary | ICD-10-CM | POA: Diagnosis present

## 2020-09-25 DIAGNOSIS — Z818 Family history of other mental and behavioral disorders: Secondary | ICD-10-CM

## 2020-09-25 DIAGNOSIS — Z881 Allergy status to other antibiotic agents status: Secondary | ICD-10-CM

## 2020-09-25 LAB — SARS CORONAVIRUS 2 (TAT 6-24 HRS): SARS Coronavirus 2: NEGATIVE

## 2020-09-25 MED ORDER — LITHIUM CARBONATE ER 450 MG PO TBCR
450.0000 mg | EXTENDED_RELEASE_TABLET | Freq: Every day | ORAL | Status: DC
  Filled 2020-09-25: qty 1

## 2020-09-25 MED ORDER — ZIPRASIDONE MESYLATE 20 MG IM SOLR: 10.0000 mg | Freq: Two times a day (BID) | INTRAMUSCULAR | Status: DC | PRN

## 2020-09-25 MED ORDER — LORAZEPAM 1 MG PO TABS: 1.0000 mg | ORAL_TABLET | Freq: Four times a day (QID) | ORAL | Status: DC | PRN

## 2020-09-25 MED ORDER — OLANZAPINE 10 MG PO TBDP
10.0000 mg | ORAL_TABLET | Freq: Every day | ORAL | Status: DC
  Administered 2020-09-25 – 2020-09-26 (×2): 10 mg via ORAL
  Filled 2020-09-25 (×4): qty 1

## 2020-09-25 MED ORDER — OLANZAPINE 10 MG PO TBDP
10.0000 mg | ORAL_TABLET | Freq: Three times a day (TID) | ORAL | Status: DC | PRN
  Administered 2020-09-27 – 2020-09-30 (×2): 10 mg via ORAL
  Filled 2020-09-25: qty 1

## 2020-09-25 MED ORDER — QUETIAPINE FUMARATE 100 MG PO TABS
100.0000 mg | ORAL_TABLET | Freq: Every day | ORAL | Status: DC
  Filled 2020-09-25: qty 1

## 2020-09-25 MED ORDER — PNEUMOCOCCAL VAC POLYVALENT 25 MCG/0.5ML IJ INJ
0.5000 mL | INJECTION | INTRAMUSCULAR | Status: DC
  Filled 2020-09-25: qty 0.5

## 2020-09-25 MED ORDER — LORAZEPAM 1 MG PO TABS
1.0000 mg | ORAL_TABLET | Freq: Four times a day (QID) | ORAL | Status: DC | PRN
  Administered 2020-09-26: 1 mg via ORAL
  Filled 2020-09-25: qty 1

## 2020-09-25 MED ORDER — QUETIAPINE FUMARATE 50 MG PO TABS: 50.0000 mg | ORAL_TABLET | Freq: Three times a day (TID) | ORAL | Status: DC | PRN

## 2020-09-25 MED ORDER — GABAPENTIN 100 MG PO CAPS
100.0000 mg | ORAL_CAPSULE | Freq: Three times a day (TID) | ORAL | Status: DC
  Administered 2020-09-25 – 2020-09-26 (×3): 100 mg via ORAL
  Filled 2020-09-25 (×10): qty 1

## 2020-09-25 MED ORDER — ALUM & MAG HYDROXIDE-SIMETH 200-200-20 MG/5ML PO SUSP: 30.0000 mL | ORAL | Status: DC | PRN

## 2020-09-25 MED ORDER — OLANZAPINE 5 MG PO TBDP: 5.0000 mg | ORAL_TABLET | Freq: Three times a day (TID) | ORAL | Status: DC | PRN

## 2020-09-25 MED ORDER — ACETAMINOPHEN 325 MG PO TABS
650.0000 mg | ORAL_TABLET | Freq: Four times a day (QID) | ORAL | Status: DC | PRN
  Administered 2020-09-26 – 2020-10-01 (×8): 650 mg via ORAL
  Filled 2020-09-25 (×8): qty 2

## 2020-09-25 MED ORDER — DIVALPROEX SODIUM 500 MG PO DR TAB
500.0000 mg | DELAYED_RELEASE_TABLET | Freq: Every day | ORAL | Status: DC
  Administered 2020-09-25 – 2020-09-26 (×2): 500 mg via ORAL
  Filled 2020-09-25 (×4): qty 1

## 2020-09-25 MED ORDER — HYDROXYZINE HCL 25 MG PO TABS
25.0000 mg | ORAL_TABLET | Freq: Three times a day (TID) | ORAL | Status: DC | PRN
  Administered 2020-09-25 – 2020-09-29 (×6): 25 mg via ORAL
  Filled 2020-09-25 (×4): qty 1
  Filled 2020-09-25: qty 10
  Filled 2020-09-25 (×3): qty 1

## 2020-09-25 MED ORDER — TRAZODONE HCL 50 MG PO TABS: 50.0000 mg | ORAL_TABLET | Freq: Every evening | ORAL | Status: DC | PRN

## 2020-09-25 MED ORDER — MAGNESIUM HYDROXIDE 400 MG/5ML PO SUSP: 30.0000 mL | Freq: Every day | ORAL | Status: DC | PRN

## 2020-09-25 NOTE — Progress Notes (Signed)
Pt accepted to [facility] 505-1    Patient meets inpatient criteria per [facility] Rankin,NP   Dr. Landry Mellow is the attending provider.    Call report to [phone]    Ma Katrina Chana Bode, RN @ [facility] ED notified.     Pt scheduled  to arrive at [facility] at 1230   Damita Dunnings, MSW, LCSW-A  8:04 AM 09/25/2020

## 2020-09-25 NOTE — ED Notes (Signed)
Breakfast Order Placed ?

## 2020-09-25 NOTE — ED Provider Notes (Signed)
Emergency Medicine Observation Re-evaluation Note  Brett House is a 35 y.o. male, seen on rounds today.  Pt initially presented to the ED for complaints of Hallucinations Currently, the patient is calm, alert, nad.   Physical Exam  BP 107/67 (BP Location: Right Arm)   Pulse 83   Temp 98.2 F (36.8 C) (Oral)   Resp 17   Ht 1.778 m (5\' 10" )   Wt 74.8 kg   SpO2 97%   BMI 23.68 kg/m  Physical Exam General: calm, alert Cardiac: regular rate Lungs: breathing comfortably Psych: calm, alert. Pt currently does not appear to be responding to internal stimuli.   ED Course / MDM  EKG:EKG Interpretation  Date/Time:  Sunday September 23 2020 07:21:04 EDT Ventricular Rate:  42 PR Interval:  138 QRS Duration: 81 QT Interval:  471 QTC Calculation: 394 R Axis:   73 Text Interpretation: Sinus bradycardia bradycardia no sig change from previous Confirmed by [id number] [phone]) on 09/23/2020 7:23:39 AM Also confirmed by 09/25/2020 [phone]), editor [phone])  on 09/24/2020 9:33:28 AM  I have reviewed the labs performed to date as well as medications administered while in observation.  Recent changes in the last 24 hours include stabilization on meds.   Plan  Current plan is that pt has been accepted to [facility] - pt currently appears stable for  transfer.   Reviewed nursing notes and prior charts for additional history. On review prior charts, prior BH presentation in setting of stress, ?breakup with significant other, ?homelessness then. When discussing current presentation pt initially indicates meds not working, but then admits to not taking any meds as outpatient for past months and also endorses job/money issues. Also odd/unusual affect - pt vaguely describes having 'hallucinations', yet does not appear to be responding to internal stimuli, and will voice SI without appearing acutely depressed - ?possible consideration secondary issues/gain.   In any event, pt likely  would benefit from restarting meds, med adjustment and additional psychiatric care/support, whether inpatient or outpatient.         Jola Babinski, MD 09/25/20 1022

## 2020-09-25 NOTE — Progress Notes (Signed)
Adult Psychoeducational Group Note  Date:  09/25/2020 Time:  10:44 PM  Group Topic/Focus:  Wrap-Up Group:   The focus of this group is to help patients review their daily goal of treatment and discuss progress on daily workbooks.  Participation Level:  Did Not Attend  Participation Quality:   Did Not Attend  Affect:   Did Not Attend  Cognitive:   Did Not Attend  Insight: None  Engagement in Group:   Did Not Attend  Modes of Intervention:   Did Not Attend  Additional Comments:  Pt did not attend evening wrap up group tonight.  Felipa Furnace 09/25/2020, 10:44 PM

## 2020-09-25 NOTE — ED Notes (Signed)
Pt transported to [facility] via Safe transport

## 2020-09-25 NOTE — ED Notes (Signed)
Pt, accompanied by pt sitter, walked to the shower in [facility]. Shower in purple is unable to be used due to clogged drain

## 2020-09-25 NOTE — ED Notes (Signed)
Safe transport called 

## 2020-09-25 NOTE — ED Notes (Signed)
Pt and sitter returned from shower
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
## 2020-09-25 NOTE — ED Notes (Signed)
Attempted report with no success. RN is not available in the next 20 minutes per [facility].
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
## 2020-09-26 DIAGNOSIS — F319 Bipolar disorder, unspecified: Secondary | ICD-10-CM

## 2020-09-26 MED ORDER — SERTRALINE HCL 50 MG PO TABS
ORAL_TABLET | ORAL | Status: AC
  Administered 2020-09-26: 50 mg
  Filled 2020-09-26: qty 1

## 2020-09-26 MED ORDER — RISPERIDONE 1 MG PO TBDP
ORAL_TABLET | ORAL | Status: AC
  Administered 2020-09-26: 1 mg
  Filled 2020-09-26: qty 1

## 2020-09-26 MED ORDER — NICOTINE 21 MG/24HR TD PT24
21.0000 mg | MEDICATED_PATCH | Freq: Every day | TRANSDERMAL | Status: DC
  Administered 2020-09-26 – 2020-09-28 (×3): 21 mg via TRANSDERMAL
  Filled 2020-09-26 (×6): qty 1

## 2020-09-26 MED ORDER — GABAPENTIN 300 MG PO CAPS
300.0000 mg | ORAL_CAPSULE | Freq: Three times a day (TID) | ORAL | Status: DC
  Administered 2020-09-26 – 2020-09-27 (×2): 300 mg via ORAL
  Filled 2020-09-26 (×5): qty 1

## 2020-09-26 NOTE — Progress Notes (Signed)
[facility]  09/26/2020 4:49 PM Brett House  MRN:  [id number]  Subjective: Brett House reports, "I'm still hearing the voices whispering my name. I feel anxious as a result".   Objective: Patient is a 35 year old male with a reported past psychiatric history significant for bipolar disorder who [facility] department on 09/21/2020 with auditory and visual hallucinations as well as suicidal and homicidal ideation.  The patient had had a psychiatric hospitalization in our facility in November 2021.  He admitted at that time that he had previous psychiatric hospitalizations in [place] [facility] well as [facility] and 1 previously at [facility] [place].  He had also reported that he had had 2 previous suicide attempts in 2013 when he had hung himself with a belt and then again in 2017 stepped out in front of traffic. Daily notes: Brett House is seen, chart reviewed. The chart findings discussed with the treatment team. He presents alert, oriented & aware of situation. He is visible on the unit, attending group sessions. He reports feeling very anxious today because of the voices in his head whispering his name. He has been medicated. Although reported feeling anxious, he does not presents as such, although the nurse reported that he has been medicated for the anxiety/restlessness prior to this follow-up care evaluation. The nurse reported that earlier in the day, Brett House presented anxious, fidgety  with a blunted affect pacing along the unit hall-way. Patient reports that he tossed & turned all night long last night & sleep was not so good as a result. Per documentation, he slept for about 6.75. He is taking & tolerating his treatment regimen. He denies any SIHI, VH, delusional thoughts or paranoia. He is taking & tolerating his treatment regimen. There are no changes made on his current plan of care. Will continue as already in progress. Other than slight elevation in his pulse  rate (107), all other vital signs remain stable.   Principal Problem: Bipolar I disorder, current episode depressed (HCC)  Diagnosis: Principal Problem:   Bipolar I disorder, current episode depressed (HCC) Active Problems:   Bipolar disorder (HCC)  Total Time spent with patient: 15 minutes  Past Psychiatric History: See H&P  Past Medical History:  Past Medical History:  Diagnosis Date   Anxiety    Asthma    Bipolar disorder (HCC)    Depression    History reviewed. No pertinent surgical history.  Family History: History reviewed. No pertinent family history.  Family Psychiatric  History: See H&P  Social History:  Social History   Substance and Sexual Activity  Alcohol Use Yes   Comment: 6 pack once a week for last 8-9 months     Social History   Substance and Sexual Activity  Drug Use Yes   Types: Marijuana   Comment: rarely    Social History   Socioeconomic History   Marital status: Single    Spouse name: Not on file   Number of children: 0   Years of education: Not on file   Highest education level: 12th grade  Occupational History   Occupation: Unemployed  Tobacco Use   Smoking status: Every Day    Packs/day: 1.50    Years: 15.00    Pack years: 22.50    Types: Cigarettes   Smokeless tobacco: Current  Vaping Use   Vaping Use: Never used  Substance and Sexual Activity   Alcohol use: Yes    Comment: 6 pack once a week for last  8-9 months   Drug use: Yes    Types: Marijuana    Comment: rarely   Sexual activity: Yes  Other Topics Concern   Not on file  Social History Narrative   Not on file   Social Determinants of Health   Financial Resource Strain: Not on file  Food Insecurity: Not on file  Transportation Needs: Not on file  Physical Activity: Not on file  Stress: Not on file  Social Connections: Not on file   Additional Social History:   Sleep: Fair  Appetite:  Good  Current Medications: Current Facility-Administered Medications   Medication Dose Route Frequency Provider Last Rate Last Admin   acetaminophen (TYLENOL) tablet 650 mg  650 mg Oral Q6H PRN Antonieta Pert, MD   650 mg at 09/26/20 0939   alum & mag hydroxide-simeth (MAALOX/MYLANTA) 200-200-20 MG/5ML suspension 30 mL  30 mL Oral Q4H PRN Antonieta Pert, MD       divalproex (DEPAKOTE) DR tablet 500 mg  500 mg Oral QHS Antonieta Pert, MD   500 mg at 09/25/20 2141   gabapentin (NEURONTIN) capsule 300 mg  300 mg Oral TID Antonieta Pert, MD   300 mg at 09/26/20 1644   hydrOXYzine (ATARAX/VISTARIL) tablet 25 mg  25 mg Oral TID PRN Antonieta Pert, MD   25 mg at 09/26/20 0937   OLANZapine zydis (ZYPREXA) disintegrating tablet 10 mg  10 mg Oral Q8H PRN Antonieta Pert, MD       And   LORazepam (ATIVAN) tablet 1 mg  1 mg Oral Q6H PRN Antonieta Pert, MD   1 mg at 09/26/20 1123   And   ziprasidone (GEODON) injection 10 mg  10 mg Intramuscular Q12H PRN Antonieta Pert, MD       magnesium hydroxide (MILK OF MAGNESIA) suspension 30 mL  30 mL Oral Daily PRN Antonieta Pert, MD       nicotine (NICODERM CQ - dosed in mg/24 hours) patch 21 mg  21 mg Transdermal Daily Antonieta Pert, MD   21 mg at 09/26/20 1120   OLANZapine zydis (ZYPREXA) disintegrating tablet 10 mg  10 mg Oral QHS Antonieta Pert, MD   10 mg at 09/25/20 2141   pneumococcal 23 valent vaccine (PNEUMOVAX-23) injection 0.5 mL  0.5 mL Intramuscular Tomorrow-1000 Antonieta Pert, MD       traZODone (DESYREL) tablet 50 mg  50 mg Oral QHS PRN Antonieta Pert, MD        Lab Results:  Results for orders placed or performed during the hospital encounter of 09/21/20 (from the past 48 hour(s))  SARS CORONAVIRUS 2 (TAT 6-24 HRS) Nasopharyngeal Nasopharyngeal Swab     Status: None   Collection Time: 09/24/20 11:35 PM   Specimen: Nasopharyngeal Swab  Result Value Ref Range   SARS Coronavirus 2 NEGATIVE NEGATIVE    Comment: (NOTE) SARS-CoV-2 target nucleic acids are NOT  DETECTED.  The SARS-CoV-2 RNA is generally detectable in upper and lower respiratory specimens during the acute phase of infection. Negative results do not preclude SARS-CoV-2 infection, do not rule out co-infections with other pathogens, and should not be used as the sole basis for treatment or other patient management decisions. Negative results must be combined with clinical observations, patient history, and epidemiological information. The expected result is Negative.  Fact Sheet for Patients: HairSlick.no  Fact Sheet for Healthcare Providers: quierodirigir.com  This test is not yet approved or cleared by the [place]  FDA and  has been authorized for detection and/or diagnosis of SARS-CoV-2 by FDA under an Emergency Use Authorization (EUA). This EUA will remain  in effect (meaning this test can be used) for the duration of the COVID-19 declaration under Se ction 564(b)(1) of the Act, 21 U.S.C. section 360bbb-3(b)(1), unless the authorization is terminated or revoked sooner.  Performed at [facility], [address], [place], [place] [address]    Blood Alcohol level:  Lab Results  Component Value Date   [facility] <10 09/21/2020   ETH <10 10/05/2019   Metabolic Disorder Labs: Lab Results  Component Value Date   HGBA1C 5.2 01/24/2020   MPG 102.54 01/24/2020   No results found for: PROLACTIN Lab Results  Component Value Date   CHOL 217 (H) 01/24/2020   TRIG 79 01/24/2020   HDL 81 01/24/2020   CHOLHDL 2.7 01/24/2020   VLDL 16 01/24/2020   LDLCALC 120 (H) 01/24/2020   Physical Findings: AIMS:  , ,  ,  ,    CIWA:    COWS:     Musculoskeletal: Strength & Muscle Tone: within normal limits Gait & Station:  remained in bed on exam Patient leans: N/A  Psychiatric Specialty Exam: Physical Exam HENT:     Head: Normocephalic and atraumatic.  Pulmonary:     Effort: Pulmonary effort is normal.   Neurological:     Mental Status: He is alert.    Review of Systems  Constitutional: Negative.   HENT: Negative.    Eyes: Negative.   Respiratory: Negative.    Cardiovascular:  Negative for chest pain and palpitations.  Gastrointestinal:  Negative for abdominal distention, abdominal pain, constipation, diarrhea and nausea.  Genitourinary:  Negative for difficulty urinating.  Musculoskeletal: Negative.   Skin: Negative.   Allergic/Immunologic:       Allergies: E-mycin.  Neurological:  Negative for dizziness, tremors, seizures, syncope, facial asymmetry, speech difficulty, weakness, light-headedness, numbness and headaches.  Psychiatric/Behavioral:  Positive for agitation (Restlessness), dysphoric mood, hallucinations and sleep disturbance. Negative for behavioral problems, confusion, decreased concentration, self-injury and suicidal ideas. The patient is nervous/anxious and is hyperactive.    Blood pressure 119/67, pulse (!) 107, temperature 97.9 F (36.6 C), temperature source Oral, resp. rate 16, height 5\' 10"  (1.778 m), weight 71.2 kg, SpO2 98 %.Body mass index is 22.53 kg/m.  General Appearance: Casual  Eye Contact:  Fair  Speech:  Clear and Coherent  Volume:  Normal  Mood:  Anxious and Dysphoric  Affect:  Flat  Thought Process:  Coherent and Goal Directed  Orientation:  Full (Time, Place, and Person)  Thought Content:  Logical and Hallucinations: auditory hallucination calling my name"  Suicidal Thoughts:   Denies any thoughts, plans or intent.  Homicidal Thoughts:  No  Memory:  Negative NA Immediate;   Good Recent;   Good Remote;   Good  Judgement:  Fair  Insight:  Fair  Psychomotor Activity:  Increased and Restlessness  Concentration:  Concentration: Fair  Recall:  Good  Fund of Knowledge:  Fair  Language:  Good  Akathisia:  Negative  Handed:  Right  AIMS (if indicated):     Assets:  Communication Skills Desire for Improvement Physical Health Resilience   ADL's:  Intact  Cognition:  WNL  Sleep:  Number of Hours: 6.5   Treatment Plan Summary: Daily contact with patient to assess and evaluate symptoms and progress in treatment.   Continue inpatient hospitalization.  Will continue today 09/26/2020 plan as below except where it  is noted.   Mood control.  Continue Zyprexa zydis 10 mg po Q hs.   Agitation/anxiety. Continue gabapentin 300 mg tid.  Continue Vistaril 25 mg po tid prn.   Insomnia.  Continue Trazodone 50 mg po Q hs prn.  Agitation protocols.  Continue Zyprexa zydis 10 mg po Q 8 hrs prn &  Lorazepam 1 mg po Q 6 hours prn &  Geodon 10 mg IM Q 12 hours prn.  Other prn medications.  Continue acetaminophen 650 mg po Q 6 hours prn for pain/fever. Continue Mylanta 30 ml po Q 4 hours prn for indigestion.  Continue MOM 30 ml po Q daily prn for constipation.  Continue nicotine patch topically Q 24 hours prn.  Hgba1c & lipid panel results pending.  Encourage group participation. Discharge disposition plan ongoing.  Armandina Stammer, NP, pmhnp, fnp-bc 09/26/2020, 4:49 PM

## 2020-09-26 NOTE — BHH Group Notes (Signed)
The focus of this group is to help patients establish daily goals to achieve during treatment and discuss how the patient can incorporate goal setting into their daily lives to aide in recovery.  Pt did not attend group 

## 2020-09-26 NOTE — Progress Notes (Signed)
Pt received PRN Vistaril 25 mg PO at 0937 and Ativan 1 mg PO at 1123 for complain of anxiety 8/10 with minimal effect. Pt observed with increased restlessness, fidgety and observed pacing halls majority of this shift. Pt attributes his increased anxiety being related to "It's the voices in my head, sometimes it's whispers and sometimes they are loud. They are bothering me right now". Pt's affect is blunted, he's oriented to self, place and situation. Reports fair sleep and fair appetite. Denies SI, HI and VH. Received PRN Tylenol 650 mg PO for complain of right hip pain 6/10 at 0939. Reported pain level at 0/10 when reassessed at 1035.  Assigned provider made aware of pt's increased anxiety level. New order received for Gabapentin 300 mg PO TID to start this evening.  Support and encouragement offered to pt this shift. Q 15 minutes safety checks maintained without issues. All medications given as ordered with verbal education.  Denies concerns at this time. Ambulatory in milieu with a steady gait. Pt did not attend scheduled groups this shift, stating "I'm too anxious, I can't stay in there" despite PRNs given and encouragement offered. Pt is in agreement with new medication order when informed by Clinical research associate. Remains safe on and off unit.

## 2020-09-26 NOTE — BHH Counselor (Signed)
Adult Comprehensive Assessment   Patient ID: Brett House, male   DOB: [birth year], 35 y.o.   MRN: [id number]   Information Source: Information source: Patient   Current Stressors:  Patient states their primary concerns and needs for treatment are:: "hearing whispering and suicidal" Patient states their goals for this hospitilization and ongoing recovery are:: "To get better" Employment / Job issues: Currently unemployed  Family relationships: Stress with sister Surveyor, quantity / Lack of resources (include bankruptcy): No income Housing / Lack of housing: Can no longer afford rent and is homeless Social relationships: pt reports that him and his fiancee recently broke up   Living/Environment/Situation:  Living Arrangements: Other (Comment), Alone (Homeless) Living conditions (as described by patient or guardian): pt reports that he is currently homeless as his fiancee kicked him out of their home How long has patient lived in current situation?: 2 weeks What is atmosphere in current home: Temporary   Family History:  Marital status: Single What is your sexual orientation?: No Has your sexual activity been affected by drugs, alcohol, medication, or emotional stress?: UTA Does patient have children?: No   Childhood History:  By whom was/is the patient raised?: Both parents Additional childhood history information: Patient's parents are deceased Description of patient's relationship with caregiver when they were a child: "good" Patient's description of current relationship with people who raised him/her: both parents are currently deceased How were you disciplined when you got in trouble as a child/adolescent?: "whooped" Does patient have siblings?: Yes Number of Siblings: 2 Description of patient's current relationship with siblings: "I don't talk to either of them." Did patient suffer any verbal/emotional/physical/sexual abuse as a child?: No Did patient suffer from severe childhood  neglect?: No Has patient ever been sexually abused/assaulted/raped as an adolescent or adult?: No Was the patient ever a victim of a crime or a disaster?: No Witnessed domestic violence?: No Has patient been affected by domestic violence as an adult?: No   Education:  Highest grade of school patient has completed: some college Currently a Consulting civil engineer?: No Learning disability?: No   Employment/Work Situation:   Employment situation: Unemployed Patient's job has been impacted by current illness: No What is the longest time patient has a held a job?: 4 1/2 years Where was the patient employed at that time?: [employer] Has patient ever been in the [employer]?: No   Financial Resources:   Surveyor, quantity resources: No income Does patient have a Lawyer or guardian?: No   Alcohol/Substance Abuse:   What has been your use of drugs/alcohol within the last 12 months?: None reported Alcohol/Substance Abuse Treatment Hx: Past Tx, Inpatient If yes, describe treatment: [facility] Has alcohol/substance abuse ever caused legal problems?: Yes (3 DUIs)   Social Support System:   Patient's Community Support System: Poor, states he has one friend Type of faith/religion: Baptist How does patient's faith help to cope with current illness?: "I pray and give it away"   Leisure/Recreation:   Do You Have Hobbies?: Yes Leisure and Hobbies: "fishing, be outside and camping"   Strengths/Needs:   What is the patient's perception of their strengths?: "I don't give up, hard worker" Patient states these barriers may affect/interfere with their treatment: "I don't have any of my stuff." Patient states these barriers may affect their return to the community: "I don't have anywhere to go."   Discharge Plan:   Currently receiving community mental health services: No Patient states concerns and preferences for aftercare planning are: Pt would like to be  set up with therapy and medication managment Patient  states they will know when they are safe and ready for discharge when: UTA Does patient have access to transportation?: No Does patient have financial barriers related to discharge medications?: Yes Patient description of barriers related to discharge medications: pt has no income or insurance Plan for no access to transportation at discharge: CSW will continue to assess Plan for living situation after discharge: pt will be given housing resources Will patient be returning to same living situation after discharge?: No   Summary/Recommendations:   Summary and Recommendations (to be completed by the evaluator): Brett House was admitted with a past hx of bipolar disorder. Recent stressors include unemployed, no income, lack of support and resources, currently homeless. Pt currently sees no outpatient providers. While here, Brett House can benefit from crisis stabilization, medication management, therapeutic milieu, and referrals for services.

## 2020-09-26 NOTE — Progress Notes (Signed)
Recreation Therapy Notes  Date: 7.27.22 Time: 1005 Location: 500 Hall Day Room   Group Topic: Leisure Education    Goal Area(s) Addresses:  Patient will successfully identify benefits of leisure participation. Patient will successfully identify ways to access leisure activities. Patient will identify leisure activities available based on their geographical location in proximity to their home. Patient will follow directions on first prompt.   Intervention: Scientist, clinical (histocompatibility and immunogenetics), Markers, Scissors, Glue Sticks, Magazines   Activity: Patients were to select an activity provided by LRT.  Patients were to then create a PSA poster describing the activity.  Patients also explained the benefits, equipment needed, where it can be done and who can do the activity.  Patients were to be as creative as needed and presented PSAs to group when completed.    Education:  Leisure Education, Building control surveyor   Education Outcome: Acknowledges education   Clinical Observations/Feedback: Pt did not attend group session.      Caroll Rancher, LRT/CTRS         Lillia Abed, Alaila Pillard A 09/26/2020 1:05 PM

## 2020-09-26 NOTE — BHH Group Notes (Signed)
[facility] LCSW Group Therapy Note  Date/Time: 09/26/2020  Type of Therapy and Topic:  Group Therapy:  Strengths and Qualities  Participation Level:  Active  Description of Group:    In this group patients will be asked to explore and define the terms strength ans qualities.  Patients will be guided to discuss their thoughts, feelings, and behaviors as to where strengths and qualities originate. Participants will then list some of their strengths and qualities related to each subject topic. This group will be process-oriented, with patients participating in exploration of their own experiences as well as giving and receiving support and challenge from other group members.  Therapeutic Goals: Patient will identify specific strengths related to their personal life. Patient will identify feelings, thoughts, and beliefs about strengths and qualities. Patient will identify ways their strengths have been used. . Patient will identify situations where they have helped others or made someone else happy. .  Summary of Patient Progress Patient participate and engaged appropriately in group. Patient identified specific strengths that they had including being honest and having a love of learning. Patient explored different strengths utilized in relationships, professional life and Arboriculturist.  Patient engaged in discussion and was there to support others in affirming and recognizing there strengths.      Therapeutic Modalities:   Cognitive Behavioral Therapy Solution Focused Therapy Motivational Interviewing Brief Therapy    Myrtie Leuthold, LCSW, LCAS Clincal Social Worker  [facility]

## 2020-09-26 NOTE — BHH Group Notes (Signed)
Adult Psychoeducational Group Note  Date:  09/26/2020 Time:  4:51 PM  Group Topic/Focus:  Overcoming Stress:   The focus of this group is to define stress and help patients assess their triggers.  Participation Level:  Did Not Attend    Brett House 09/26/2020, 4:51 PM

## 2020-09-26 NOTE — Tx Team (Signed)
Interdisciplinary Treatment and Diagnostic Plan Update  09/26/2020 Time of Session: 9:50am  Brett House MRN: [id number]  Principal Diagnosis: <principal problem not specified>  Secondary Diagnoses: Active Problems:   Bipolar disorder ([place])   Current Medications:  Current Facility-Administered Medications  Medication Dose Route Frequency Provider Last Rate Last Admin   acetaminophen (TYLENOL) tablet 650 mg  650 mg Oral Q6H PRN Sharma Covert, MD   650 mg at 09/26/20 0939   alum & mag hydroxide-simeth (MAALOX/MYLANTA) 200-200-20 MG/5ML suspension 30 mL  30 mL Oral Q4H PRN Sharma Covert, MD       divalproex (DEPAKOTE) DR tablet 500 mg  500 mg Oral QHS Sharma Covert, MD   500 mg at 09/25/20 2141   gabapentin (NEURONTIN) capsule 300 mg  300 mg Oral TID Sharma Covert, MD       hydrOXYzine (ATARAX/VISTARIL) tablet 25 mg  25 mg Oral TID PRN Sharma Covert, MD   25 mg at 09/26/20 0937   OLANZapine zydis (ZYPREXA) disintegrating tablet 10 mg  10 mg Oral Q8H PRN Sharma Covert, MD       And   LORazepam (ATIVAN) tablet 1 mg  1 mg Oral Q6H PRN Sharma Covert, MD   1 mg at 09/26/20 1123   And   ziprasidone (GEODON) injection 10 mg  10 mg Intramuscular Q12H PRN Sharma Covert, MD       magnesium hydroxide (MILK OF MAGNESIA) suspension 30 mL  30 mL Oral Daily PRN Sharma Covert, MD       nicotine (NICODERM CQ - dosed in mg/24 hours) patch 21 mg  21 mg Transdermal Daily Sharma Covert, MD   21 mg at 09/26/20 1120   OLANZapine zydis (ZYPREXA) disintegrating tablet 10 mg  10 mg Oral QHS Sharma Covert, MD   10 mg at 09/25/20 2141   pneumococcal 23 valent vaccine (PNEUMOVAX-23) injection 0.5 mL  0.5 mL Intramuscular Tomorrow-1000 Sharma Covert, MD       traZODone (DESYREL) tablet 50 mg  50 mg Oral QHS PRN Sharma Covert, MD       PTA Medications: Medications Prior to Admission  Medication Sig Dispense Refill Last Dose   acetaminophen (TYLENOL) 500  MG tablet Take 1,000 mg by mouth every 6 (six) hours as needed for headache (pain).      lithium carbonate (ESKALITH) 450 MG CR tablet Take 1 tablet (450 mg total) by mouth every 12 (twelve) hours. (Patient not taking: Reported on 09/22/2020) 60 tablet 0    nicotine (NICODERM CQ - DOSED IN MG/24 HOURS) 21 mg/24hr patch Place 1 patch (21 mg total) onto the skin daily. (Patient not taking: Reported on 09/22/2020) 30 patch 0    QUEtiapine (SEROQUEL) 100 MG tablet Take 1 tablet (100 mg total) by mouth at bedtime. (Patient not taking: Reported on 09/22/2020) 30 tablet 0     Patient Stressors:    Patient Strengths:    Treatment Modalities: Medication Management, Group therapy, Case management,  1 to 1 session with clinician, Psychoeducation, Recreational therapy.   Physician Treatment Plan for Primary Diagnosis: <principal problem not specified> Long Term Goal(s): Improvement in symptoms so as ready for discharge   Short Term Goals: Ability to identify changes in lifestyle to reduce recurrence of condition will improve Ability to verbalize feelings will improve Ability to disclose and discuss suicidal ideas Ability to demonstrate self-control will improve Ability to identify and develop effective coping behaviors will improve Ability to  maintain clinical measurements within normal limits will improve Compliance with prescribed medications will improve Ability to identify triggers associated with substance abuse/mental health issues will improve  Medication Management: Evaluate patient's response, side effects, and tolerance of medication regimen.  Therapeutic Interventions: 1 to 1 sessions, Unit Group sessions and Medication administration.  Evaluation of Outcomes: Not Met  Physician Treatment Plan for Secondary Diagnosis: Active Problems:   Bipolar disorder ([place])  Long Term Goal(s): Improvement in symptoms so as ready for discharge   Short Term Goals: Ability to identify changes in  lifestyle to reduce recurrence of condition will improve Ability to verbalize feelings will improve Ability to disclose and discuss suicidal ideas Ability to demonstrate self-control will improve Ability to identify and develop effective coping behaviors will improve Ability to maintain clinical measurements within normal limits will improve Compliance with prescribed medications will improve Ability to identify triggers associated with substance abuse/mental health issues will improve     Medication Management: Evaluate patient's response, side effects, and tolerance of medication regimen.  Therapeutic Interventions: 1 to 1 sessions, Unit Group sessions and Medication administration.  Evaluation of Outcomes: Not Met   RN Treatment Plan for Primary Diagnosis: <principal problem not specified> Long Term Goal(s): Knowledge of disease and therapeutic regimen to maintain health will improve  Short Term Goals: Ability to remain free from injury will improve, Ability to participate in decision making will improve, Ability to verbalize feelings will improve, Ability to disclose and discuss suicidal ideas, and Ability to identify and develop effective coping behaviors will improve  Medication Management: RN will administer medications as ordered by provider, will assess and evaluate patient's response and provide education to patient for prescribed medication. RN will report any adverse and/or side effects to prescribing provider.  Therapeutic Interventions: 1 on 1 counseling sessions, Psychoeducation, Medication administration, Evaluate responses to treatment, Monitor vital signs and CBGs as ordered, Perform/monitor CIWA, COWS, AIMS and Fall Risk screenings as ordered, Perform wound care treatments as ordered.  Evaluation of Outcomes: Not Met   LCSW Treatment Plan for Primary Diagnosis: <principal problem not specified> Long Term Goal(s): Safe transition to appropriate next level of care at  discharge, Engage patient in therapeutic group addressing interpersonal concerns.  Short Term Goals: Engage patient in aftercare planning with referrals and resources, Increase social support, Increase emotional regulation, Facilitate acceptance of mental health diagnosis and concerns, Identify triggers associated with mental health/substance abuse issues, and Increase skills for wellness and recovery  Therapeutic Interventions: Assess for all discharge needs, 1 to 1 time with Social worker, Explore available resources and support systems, Assess for adequacy in community support network, Educate family and significant other(s) on suicide prevention, Complete Psychosocial Assessment, Interpersonal group therapy.  Evaluation of Outcomes: Not Met   Progress in Treatment: Attending groups: Yes. Participating in groups: Yes. Taking medication as prescribed: Yes. Toleration medication: Yes. Family/Significant other contact made: Yes, individual(s) contacted:  Friend  Patient understands diagnosis: No. Discussing patient identified problems/goals with staff: Yes. Medical problems stabilized or resolved: Yes. Denies suicidal/homicidal ideation: Yes. Issues/concerns per patient self-inventory: No.   New problem(s) identified: No, Describe:  None   New Short Term/Long Term Goal(s): medication stabilization, elimination of SI thoughts, development of comprehensive mental wellness plan.   Patient Goals: "To get stable"   Discharge Plan or Barriers: Patient recently admitted. CSW will continue to follow and assess for appropriate referrals and possible discharge planning.   Reason for Continuation of Hospitalization: Depression Hallucinations Medication stabilization Suicidal ideation  Estimated Length of  Stay: 3 to 5 days   Attendees: Patient: Brett House  09/26/2020   Physician: Lestine Mount, DO 09/26/2020   Nursing:  09/26/2020   RN Care Manager: 09/26/2020   Social Worker: [name], [place] 09/26/2020   Recreational Therapist:  09/26/2020   Other:  09/26/2020   Other:  09/26/2020   Other: 09/26/2020     Scribe for Treatment Team: Darleen Crocker, [place] 09/26/2020 3:06 PM

## 2020-09-26 NOTE — Progress Notes (Signed)
Recreation Therapy Notes  INPATIENT RECREATION THERAPY ASSESSMENT  Patient Details Name: Brett House MRN: [id number] DOB: 1985/12/28 Today's Date: 09/26/2020       Information Obtained From: Patient  Able to Participate in Assessment/Interview: Yes  Patient Presentation: Alert, Anxious  Reason for Admission (Per Patient): Suicidal Ideation, Other (Comments) (Hearing Voices/Seeing Things)  Patient Stressors: Relationship, Work, Other (Comment) (Living situation; Recent break up with girlfriend; Recently loss job)  Coping Skills:   Film/video editor, Journal, Sports, Arguments, Music, Exercise, Talk, Prayer, Avoidance, Read, [employer] Bath/Shower  Leisure Interests (2+):  [employer], Technical brewer - Hiking, Technical brewer - Other (Comment) Engineer, site)  Frequency of Recreation/Participation: Other (Comment) (As often as possible)  Awareness of Community Resources:  No  Expressed Interest in [employer] Information: No  [employer] of Residence:  Engineer, technical sales  Patient Main Form of Transportation: Therapist, music  Patient Strengths:  Automotive engineer, Hard working, Teacher, early years/pre, Probation officer, Flexible  Patient Identified Areas of Improvement:  "be upfront about my mental health and not sugar coat it"  Patient Goal for Hospitalization:  "get whispering/chatter to stop and quit thinking about suicide"  Current SI (including self-harm):  No  Current HI:  No  Current AVH: No  Staff Intervention Plan: Group Attendance, Collaborate with Interdisciplinary Treatment Team  Consent to Intern Participation: N/A    Caroll Rancher, LRT/CTRS   Caroll Rancher A 09/26/2020, 3:03 PM

## 2020-09-26 NOTE — Progress Notes (Signed)
Adult Psychoeducational Group Note  Date:  09/26/2020 Time:  9:19 PM  Group Topic/Focus:  Wrap-Up Group:   The focus of this group is to help patients review their daily goal of treatment and discuss progress on daily workbooks.  Participation Level:  Active  Participation Quality:  Appropriate  Affect:  Appropriate  Cognitive:  Oriented  Insight: Appropriate  Engagement in Group:  Engaged  Modes of Intervention:  Education  Additional Comments:  Patient attended and participated in group tonight. He reports that the significant thing that happen today was him learning to talk about his feelings and not holding it in.  Lita Mains [facility] 09/26/2020, 9:19 PM

## 2020-09-26 NOTE — Progress Notes (Signed)
   09/25/20 2130  Psych Admission Type (Psych Patients Only)  Admission Status Voluntary  Psychosocial Assessment  Patient Complaints Depression  Eye Contact Brief  Facial Expression Anxious  Affect Anxious  Speech Logical/coherent  Interaction Minimal  Motor Activity Slow  Appearance/Hygiene Unremarkable  Behavior Characteristics Cooperative;Anxious  Mood Depressed;Anxious  Thought Process  Coherency Circumstantial  Content WDL  Delusions None reported or observed  Perception Hallucinations  Hallucination Auditory (whispers)  Judgment Impaired  Confusion None  Danger to Self  Current suicidal ideation? Denies  Self-Injurious Behavior No self-injurious ideation or behavior indicators observed or expressed   Agreement Not to Harm Self Yes  Description of Agreement verbal agreemen to approach staff  Danger to Others  Danger to Others None reported or observed   Pt seen at med window. Denies SI, HI, VH. Endorses AH as "whispers." Rates pain 3/10 in R leg. Rates depression 7/10. Depressed affect. Takes meds as prescribed.

## 2020-09-27 LAB — LIPID PANEL
Cholesterol: 161 mg/dL (ref 0–200)
HDL: 52 mg/dL (ref >40)
LDL Cholesterol: 78 mg/dL (ref 0–99)
Total CHOL/HDL Ratio: 3.1 RATIO
Triglycerides: 156 mg/dL — ABNORMAL HIGH (ref <150)
VLDL: 31 mg/dL (ref 0–40)

## 2020-09-27 MED ORDER — DIVALPROEX SODIUM ER 500 MG PO TB24
1000.0000 mg | ORAL_TABLET | Freq: Every day | ORAL | Status: DC
  Administered 2020-09-27 – 2020-09-30 (×4): 1000 mg via ORAL
  Filled 2020-09-27 (×5): qty 2

## 2020-09-27 MED ORDER — OLANZAPINE 10 MG PO TBDP
10.0000 mg | ORAL_TABLET | ORAL | Status: AC
  Administered 2020-09-27: 10 mg via ORAL
  Filled 2020-09-27 (×2): qty 1

## 2020-09-27 MED ORDER — GABAPENTIN 400 MG PO CAPS
400.0000 mg | ORAL_CAPSULE | Freq: Three times a day (TID) | ORAL | Status: DC
  Administered 2020-09-27 – 2020-09-28 (×4): 400 mg via ORAL
  Filled 2020-09-27 (×9): qty 1

## 2020-09-27 MED ORDER — OLANZAPINE 10 MG PO TBDP
20.0000 mg | ORAL_TABLET | Freq: Every day | ORAL | Status: DC
  Administered 2020-09-27 – 2020-09-30 (×4): 20 mg via ORAL
  Filled 2020-09-27 (×3): qty 2
  Filled 2020-09-27: qty 14
  Filled 2020-09-27 (×2): qty 2

## 2020-09-27 NOTE — Progress Notes (Signed)
Patient did not attend wrap up group. 

## 2020-09-27 NOTE — Progress Notes (Signed)
Pt rated his depression 8/10, hopelessness 8/10 and anxiety 10/10. Reported poor sleep last night with good appetite, high energy and poor concentration level. Denies SI, HI and VH when assessed. Pt continue to endorse + AH "they are still calling my name and it's irritating me". Pt received PRN Zyprexa 10 mg and Vistaril 25 mg both PO at 1125 for agitation and anxiety. Pt has been argumentative, irritable on interactions with both peers and staff. Continue to pace hall "I just want to get rid of my anxiety and agitation.  Emotional support and encouragement offered to pt to comply with current medication regimen and unit routines. Q 15 minutes safety checks maintained. All medications given with verbal education and effects monitored.  Pt tolerates all medications, meals and fluids well when offered without discomfort. Pt attended scheduled group this morning and was engaged in activities when prompted. Remains safe on and off unit.

## 2020-09-27 NOTE — Progress Notes (Signed)
Recreation Therapy Notes  Date: 7.28.22 Time: 1000 Location: 500 Hall Dayroom  Group Topic: Communication  Goal Area(s) Addresses:  Patient will effectively communicate with peers in group.  Patient will verbalize benefit of healthy communication. Patient will verbalize positive effect of healthy communication on post d/c goals.   Behavioral Response:  Appropriate  Intervention: Geometrical Drawings, Blank Paper, Pencils   Activity: What You Say.  Three patients would get to be speakers.  They were to describe a picture to the group in as much detail as possible.  The remaining patients were to draw the picture how it is described to them.  The patients that are the listeners, could only as the speakers to repeat themselves.  They could not ask any detailed questions.  After each turn, the speaker will show the group the original picture and the group will compare their pictures to the original.  Education: Communication, Discharge Planning  Education Outcome: Acknowledges understanding/In group clarification offered/Needs additional education.   Clinical Observations/Feedback:  Pt was late to group but was able to complete two of the drawings.  Pt expressed "chattering in your head" can affect communication with other people.  Pt was quiet but attentive.     Caroll Rancher, LRT/CTRS     Lillia Abed, Dakari Cregger A 09/27/2020 11:14 AM

## 2020-09-27 NOTE — Progress Notes (Signed)
[facility] MD Progress Note  09/27/2020 11:36 AM Brett House  MRN:  [id number] Subjective: Patient is a 35 year old male with a reported past psychiatric history significant for bipolar disorder who originally presented to the [facility] emergency department on 09/21/2020 with auditory as well as visual hallucinations and suicidal and homicidal ideation.  Objective: Patient is seen and examined.  Patient is a 35 year old male with the above-stated past psychiatric history who is seen in follow-up.  He is not doing very well this morning.  Another patient became agitated during one of the meetings, and that upset him greatly.  He stated that the person was angry, saying bad things, and saying profanities.  He stated he was pacing up and down the hallway to try and calm himself.  He did not believe that his medications were effective at this point.  He stated that he had been having auditory hallucinations this morning.  He denied any suicidal or homicidal ideation, but did state that he was quite upset about what it occurred and was irritable.  He has gone to the nurses station asking for as needed's after the event.  Initially this morning his blood pressure was good at 113/73, but repeat was 117/100.  His pulse was between 87 and 98.  He is afebrile.  Pulse oximetry on room air was 99%.  He is sleeping well at 8.5 hours last night.  No new laboratories.  Principal Problem: Bipolar I disorder, current episode depressed (HCC) Diagnosis: Principal Problem:   Bipolar I disorder, current episode depressed ([facility]) Active Problems:   Bipolar disorder ([facility])  Total Time spent with patient: 20 minutes  Past Psychiatric History: See admission H&P  Past Medical History:  Past Medical History:  Diagnosis Date   Anxiety    Asthma    Bipolar disorder (HCC)    Depression    History reviewed. No pertinent surgical history. Family History: History reviewed. No pertinent family history. Family  Psychiatric  History: See admission H&P Social History:  Social History   Substance and Sexual Activity  Alcohol Use Yes   Comment: 6 pack once a week for last 8-9 months     Social History   Substance and Sexual Activity  Drug Use Yes   Types: Marijuana   Comment: rarely    Social History   Socioeconomic History   Marital status: Single    Spouse name: Not on file   Number of children: 0   Years of education: Not on file   Highest education level: 12th grade  Occupational History   Occupation: Unemployed  Tobacco Use   Smoking status: Every Day    Packs/day: 1.50    Years: 15.00    Pack years: 22.50    Types: Cigarettes   Smokeless tobacco: Current  Vaping Use   Vaping Use: Never used  Substance and Sexual Activity   Alcohol use: Yes    Comment: 6 pack once a week for last 8-9 months   Drug use: Yes    Types: Marijuana    Comment: rarely   Sexual activity: Yes  Other Topics Concern   Not on file  Social History Narrative   Not on file   Social Determinants of Health   Financial Resource Strain: Not on file  Food Insecurity: Not on file  Transportation Needs: Not on file  Physical Activity: Not on file  Stress: Not on file  Social Connections: Not on file   Additional Social History:  Sleep: Good  Appetite:  Good  Current Medications: Current Facility-Administered Medications  Medication Dose Route Frequency Provider Last Rate Last Admin   acetaminophen (TYLENOL) tablet 650 mg  650 mg Oral Q6H PRN Antonieta Pert, MD   650 mg at 09/27/20 0831   alum & mag hydroxide-simeth (MAALOX/MYLANTA) 200-200-20 MG/5ML suspension 30 mL  30 mL Oral Q4H PRN Antonieta Pert, MD       divalproex (DEPAKOTE ER) 24 hr tablet 1,000 mg  1,000 mg Oral QHS Antonieta Pert, MD       gabapentin (NEURONTIN) capsule 400 mg  400 mg Oral TID Antonieta Pert, MD       hydrOXYzine (ATARAX/VISTARIL) tablet 25 mg  25 mg Oral TID PRN  Antonieta Pert, MD   25 mg at 09/27/20 1125   OLANZapine zydis (ZYPREXA) disintegrating tablet 10 mg  10 mg Oral Q8H PRN Antonieta Pert, MD   10 mg at 09/27/20 1125   And   LORazepam (ATIVAN) tablet 1 mg  1 mg Oral Q6H PRN Antonieta Pert, MD   1 mg at 09/26/20 1123   And   ziprasidone (GEODON) injection 10 mg  10 mg Intramuscular Q12H PRN Antonieta Pert, MD       magnesium hydroxide (MILK OF MAGNESIA) suspension 30 mL  30 mL Oral Daily PRN Antonieta Pert, MD       nicotine (NICODERM CQ - dosed in mg/24 hours) patch 21 mg  21 mg Transdermal Daily Antonieta Pert, MD   21 mg at 09/27/20 0832   OLANZapine zydis (ZYPREXA) disintegrating tablet 10 mg  10 mg Oral NOW Antonieta Pert, MD       OLANZapine zydis (ZYPREXA) disintegrating tablet 20 mg  20 mg Oral QHS Antonieta Pert, MD       pneumococcal 23 valent vaccine (PNEUMOVAX-23) injection 0.5 mL  0.5 mL Intramuscular Tomorrow-1000 Antonieta Pert, MD       traZODone (DESYREL) tablet 50 mg  50 mg Oral QHS PRN Antonieta Pert, MD        Lab Results: No results found for this or any previous visit (from the past 48 hour(s)).  Blood Alcohol level:  Lab Results  Component Value Date   ETH <10 09/21/2020   ETH <10 10/05/2019    Metabolic Disorder Labs: Lab Results  Component Value Date   HGBA1C 5.2 01/24/2020   MPG 102.54 01/24/2020   No results found for: PROLACTIN Lab Results  Component Value Date   CHOL 217 (H) 01/24/2020   TRIG 79 01/24/2020   HDL 81 01/24/2020   CHOLHDL 2.7 01/24/2020   VLDL 16 01/24/2020   LDLCALC 120 (H) 01/24/2020    Physical Findings: AIMS:  , ,  ,  ,    CIWA:    COWS:     Musculoskeletal: Strength & Muscle Tone: within normal limits Gait & Station: normal Patient leans: N/A  Psychiatric Specialty Exam:  Presentation  General Appearance: Fairly Groomed  Eye Contact:Fair  Speech:Pressured  Speech Volume:Increased  Handedness:Right   Mood and Affect   Mood:Anxious; Irritable  Affect:Labile   Thought Process  Thought Processes:Goal Directed  Descriptions of Associations:Loose  Orientation:Full (Time, Place and Person)  Thought Content:Delusions; Paranoid Ideation  History of Schizophrenia/Schizoaffective disorder:No  Duration of Psychotic Symptoms:Less than six months  Hallucinations:Hallucinations: Auditory Ideas of Reference:Delusions; Paranoia  Suicidal Thoughts:No data recorded Homicidal Thoughts:Homicidal Thoughts: No  Sensorium  Memory:Immediate Fair; Recent Fair; Remote Fair  Judgment:Impaired  Insight:Lacking   Executive Functions  Concentration:Poor  Attention Span:Poor  Recall:Fair  Fund of Knowledge:Fair  Language:Fair   Psychomotor Activity  Psychomotor Activity: Psychomotor Activity: Increased; Restlessness  Assets  Assets:Desire for Improvement; Resilience   Sleep  Sleep: Sleep: Good Number of Hours of Sleep: 8.5   Physical Exam: Physical Exam Vitals and nursing note reviewed.  Constitutional:      Appearance: Normal appearance.  HENT:     Head: Normocephalic and atraumatic.  Pulmonary:     Effort: Pulmonary effort is normal.  Neurological:     General: No focal deficit present.     Mental Status: He is alert and oriented to person, place, and time.   Review of Systems  All other systems reviewed and are negative. Blood pressure (!) 117/100, pulse 98, temperature 98 F (36.7 C), temperature source Oral, resp. rate 16, height 5\' 10"  (1.778 m), weight 71.2 kg, SpO2 99 %. Body mass index is 22.53 kg/m.   Treatment Plan Summary: Daily contact with patient to assess and evaluate symptoms and progress in treatment, Medication management, and Plan patient is seen and examined.  Patient is a 35 year old male with the above-stated past psychiatric history who is seen in follow-up.  Diagnosis: 1.  Bipolar disorder, most recently manic, severe with psychotic features versus  schizoaffective disorder; bipolar type 2.  History of polysubstance use disorders  Pertinent findings on examination today: 1.  Patient continues with paranoia, and remains labile.  There was some problem in the morning group this a.m., but his reaction has been more elevated than it should have been. 2.  Patient remains with auditory hallucinations. 3.  Patient remains irritable and labile.  Plan: 1.  Change Depakote DR to Depakote ER 1000 mg p.o. nightly for mood stability. 2.  Increase gabapentin to 400 mg p.o. 3 times daily for anxiety and mood stability. 3.  Increase olanzapine to 10 mg now, and 20 mg p.o. nightly for mood stability and psychosis. 4.  Continue olanzapine agitation protocol as needed. 5.  If he still shows no significant improvement in mood stability and psychosis we may have to return at least back to his lithium. 6.  Disposition planning-in progress.  31, MD 09/27/2020, 11:36 AM

## 2020-09-28 MED ORDER — DOXEPIN HCL 25 MG PO CAPS
50.0000 mg | ORAL_CAPSULE | Freq: Every day | ORAL | Status: DC
  Administered 2020-09-28 – 2020-09-30 (×3): 50 mg via ORAL
  Filled 2020-09-28 (×2): qty 2
  Filled 2020-09-28 (×3): qty 1

## 2020-09-28 MED ORDER — TIAGABINE HCL 2 MG PO TABS
4.0000 mg | ORAL_TABLET | Freq: Every day | ORAL | Status: DC
  Administered 2020-09-28 – 2020-09-30 (×3): 4 mg via ORAL
  Filled 2020-09-28: qty 14
  Filled 2020-09-28 (×5): qty 1
  Filled 2020-09-28: qty 14

## 2020-09-28 MED ORDER — NICOTINE POLACRILEX 2 MG MT GUM
2.0000 mg | CHEWING_GUM | OROMUCOSAL | Status: DC | PRN
  Administered 2020-09-28 – 2020-10-01 (×12): 2 mg via ORAL
  Filled 2020-09-28 (×5): qty 1
  Filled 2020-09-28: qty 10
  Filled 2020-09-28 (×4): qty 1

## 2020-09-28 NOTE — Plan of Care (Signed)
  Problem: Education: Goal: Will be free of psychotic symptoms Outcome: Not Progressing   Problem: Coping: Goal: Coping ability will improve Outcome: Not Progressing   Problem: Role Relationship: Goal: Ability to interact with others will improve Outcome: Not Progressing

## 2020-09-28 NOTE — Progress Notes (Signed)
      09/28/20 2115  Psych Admission Type (Psych Patients Only)  Admission Status Voluntary  Psychosocial Assessment  Patient Complaints Anxiety  Eye Contact Brief  Facial Expression Anxious  Affect Anxious;Irritable  Speech Logical/coherent  Interaction Minimal;Guarded  Motor Activity Slow  Appearance/Hygiene Unremarkable  Behavior Characteristics Cooperative;Anxious  Mood Anxious  Thought Process  Coherency Circumstantial  Content WDL  Delusions None reported or observed  Perception Hallucinations  Hallucination Auditory  Judgment Impaired  Confusion None  Danger to Self  Current suicidal ideation? Passive  Self-Injurious Behavior No self-injurious ideation or behavior indicators observed or expressed   Agreement Not to Harm Self Yes  Description of Agreement pt verbally contracts for safety  Danger to Others  Danger to Others None reported or observed

## 2020-09-28 NOTE — Progress Notes (Signed)
[facility] MD Progress Note  09/28/2020 3:22 PM Brett House  MRN:  [id number] Subjective:  Patient is a 35 year old male with a reported past psychiatric history significant for bipolar disorder who originally presented to the [facility] emergency department on 09/21/2020 with auditory as well as visual hallucinations and suicidal and homicidal ideation.  Objective: Patient is seen and examined.  Patient is a 35 year old male with the above-stated past psychiatric history who is seen in follow-up.  He continues to complain of auditory hallucinations.  He stated that the voices make him so anxious.  He focuses more anxiety than psychosis.  He had been on the [address], but another male patient was admitted to his room last night, and there was a conflict.  He had to be moved to the [address].  We are attempting to rearrange rooms so that he can return to the [address].  He stated that now besides being anxious he is sleepy.  He wants to know what sleeping medicine that he is on.  We discussed the fact that Neurontin can be sedating and he is getting that 400 mg 3 times a day, but does not appear to be helping his anxiety at all.  He does continue on Zyprexa Zydis 20 mg p.o. nightly.  He also is available hydroxyzine 25 mg p.o. 3 times daily as needed anxiety.  His vital signs are stable, he is afebrile.  He slept 6.75 hours last night.  No new laboratories.  Principal Problem: Bipolar I disorder, current episode depressed (HCC) Diagnosis: Principal Problem:   Bipolar I disorder, current episode depressed (HCC) Active Problems:   Bipolar disorder (HCC)  Total Time spent with patient: 20 minutes  Past Psychiatric History: See admission H&P  Past Medical History:  Past Medical History:  Diagnosis Date   Anxiety    Asthma    Bipolar disorder (HCC)    Depression    History reviewed. No pertinent surgical history. Family History: History reviewed. No pertinent family history. Family  Psychiatric  History: See admission H&P Social History:  Social History   Substance and Sexual Activity  Alcohol Use Yes   Comment: 6 pack once a week for last 8-9 months     Social History   Substance and Sexual Activity  Drug Use Yes   Types: Marijuana   Comment: rarely    Social History   Socioeconomic History   Marital status: Single    Spouse name: Not on file   Number of children: 0   Years of education: Not on file   Highest education level: 12th grade  Occupational History   Occupation: Unemployed  Tobacco Use   Smoking status: Every Day    Packs/day: 1.50    Years: 15.00    Pack years: 22.50    Types: Cigarettes   Smokeless tobacco: Current  Vaping Use   Vaping Use: Never used  Substance and Sexual Activity   Alcohol use: Yes    Comment: 6 pack once a week for last 8-9 months   Drug use: Yes    Types: Marijuana    Comment: rarely   Sexual activity: Yes  Other Topics Concern   Not on file  Social History Narrative   Not on file   Social Determinants of Health   Financial Resource Strain: Not on file  Food Insecurity: Not on file  Transportation Needs: Not on file  Physical Activity: Not on file  Stress: Not on file  Social Connections: Not  on file   Additional Social History:                         Sleep: Good  Appetite:  Good  Current Medications: Current Facility-Administered Medications  Medication Dose Route Frequency Provider Last Rate Last Admin   acetaminophen (TYLENOL) tablet 650 mg  650 mg Oral Q6H PRN Antonieta Pertlary, Ammy Lienhard Lawson, MD   650 mg at 09/27/20 0831   alum & mag hydroxide-simeth (MAALOX/MYLANTA) 200-200-20 MG/5ML suspension 30 mL  30 mL Oral Q4H PRN Antonieta Pertlary, Myia Bergh Lawson, MD       divalproex (DEPAKOTE ER) 24 hr tablet 1,000 mg  1,000 mg Oral QHS Antonieta Pertlary, Lavante Toso Lawson, MD   1,000 mg at 09/27/20 2117   gabapentin (NEURONTIN) capsule 400 mg  400 mg Oral TID Antonieta Pertlary, Aariyah Sampey Lawson, MD   400 mg at 09/28/20 1258   hydrOXYzine  (ATARAX/VISTARIL) tablet 25 mg  25 mg Oral TID PRN Antonieta Pertlary, Tiyanna Larcom Lawson, MD   25 mg at 09/28/20 1300   OLANZapine zydis (ZYPREXA) disintegrating tablet 10 mg  10 mg Oral Q8H PRN Antonieta Pertlary, Zamira Hickam Lawson, MD   10 mg at 09/27/20 1125   And   LORazepam (ATIVAN) tablet 1 mg  1 mg Oral Q6H PRN Antonieta Pertlary, Finnean Cerami Lawson, MD   1 mg at 09/26/20 1123   And   ziprasidone (GEODON) injection 10 mg  10 mg Intramuscular Q12H PRN Antonieta Pertlary, Caycee Wanat Lawson, MD       magnesium hydroxide (MILK OF MAGNESIA) suspension 30 mL  30 mL Oral Daily PRN Antonieta Pertlary, Loring Liskey Lawson, MD       nicotine (NICODERM CQ - dosed in mg/24 hours) patch 21 mg  21 mg Transdermal Daily Antonieta Pertlary, Amiayah Giebel Lawson, MD   21 mg at 09/28/20 0801   OLANZapine zydis (ZYPREXA) disintegrating tablet 20 mg  20 mg Oral QHS Antonieta Pertlary, Jordain Radin Lawson, MD   20 mg at 09/27/20 2117   pneumococcal 23 valent vaccine (PNEUMOVAX-23) injection 0.5 mL  0.5 mL Intramuscular Tomorrow-1000 Antonieta Pertlary, Amarise Lillo Lawson, MD       traZODone (DESYREL) tablet 50 mg  50 mg Oral QHS PRN Antonieta Pertlary, Zaylen Susman Lawson, MD        Lab Results:  Results for orders placed or performed during the hospital encounter of 09/25/20 (from the past 48 hour(s))  Lipid panel     Status: Abnormal   Collection Time: 09/27/20  6:55 PM  Result Value Ref Range   Cholesterol 161 0 - 200 mg/dL   Triglycerides 161156 (H) <150 mg/dL   HDL 52 >09>40 mg/dL   Total CHOL/HDL Ratio 3.1 RATIO   VLDL 31 0 - 40 mg/dL   LDL Cholesterol 78 0 - 99 mg/dL    Comment:        Total Cholesterol/HDL:CHD Risk Coronary Heart Disease Risk Table                     Men   Women  1/2 Average Risk   3.4   3.3  Average Risk       5.0   4.4  2 X Average Risk   9.6   7.1  3 X Average Risk  23.4   11.0        Use the calculated Patient Ratio above and the CHD Risk Table to determine the patient's CHD Risk.        ATP III CLASSIFICATION (LDL):  <100     mg/dL   Optimal  [id number]  mg/dL   Near or Above                    Optimal  130-159  mg/dL   Borderline  749-449  mg/dL    High  >675     mg/dL   Very High Performed at [facility], [address], [place], [place] [address]     Blood Alcohol level:  Lab Results  Component Value Date   ETH <10 09/21/2020   ETH <10 10/05/2019    Metabolic Disorder Labs: Lab Results  Component Value Date   HGBA1C 5.2 01/24/2020   MPG 102.54 01/24/2020   No results found for: PROLACTIN Lab Results  Component Value Date   CHOL 161 09/27/2020   TRIG 156 (H) 09/27/2020   HDL 52 09/27/2020   CHOLHDL 3.1 09/27/2020   VLDL 31 09/27/2020   LDLCALC 78 09/27/2020   LDLCALC 120 (H) 01/24/2020    Physical Findings: AIMS:  , ,  ,  ,    CIWA:    COWS:     Musculoskeletal: Strength & Muscle Tone: within normal limits Gait & Station: normal Patient leans: N/A  Psychiatric Specialty Exam:  Presentation  General Appearance: Fairly Groomed  Eye Contact:Fair  Speech:Pressured  Speech Volume:Increased  Handedness:Right   Mood and Affect  Mood:Anxious; Irritable  Affect:Labile   Thought Process  Thought Processes:Goal Directed  Descriptions of Associations:Loose  Orientation:Full (Time, Place and Person)  Thought Content:Delusions; Paranoid Ideation  History of Schizophrenia/Schizoaffective disorder:No  Duration of Psychotic Symptoms:Less than six months  Hallucinations:Hallucinations: Auditory  Ideas of Reference:Delusions; Paranoia  Suicidal Thoughts:No data recorded Homicidal Thoughts:Homicidal Thoughts: No   Sensorium  Memory:Immediate Fair; Recent Fair; Remote Fair  Judgment:Impaired  Insight:Lacking   Executive Functions  Concentration:Poor  Attention Span:Poor  Recall:Fair  Fund of Knowledge:Fair  Language:Fair   Psychomotor Activity  Psychomotor Activity:Psychomotor Activity: Increased; Restlessness   Assets  Assets:Desire for Improvement; Resilience   Sleep  Sleep:Sleep: Good Number of Hours of Sleep: 8.5    Physical Exam: Physical  Exam Vitals and nursing note reviewed.  Constitutional:      Appearance: Normal appearance.  HENT:     Head: Normocephalic and atraumatic.  Pulmonary:     Effort: Pulmonary effort is normal.  Neurological:     General: No focal deficit present.     Mental Status: He is alert and oriented to person, place, and time.   Review of Systems  All other systems reviewed and are negative. Blood pressure 111/64, pulse 99, temperature 98 F (36.7 C), temperature source Oral, resp. rate 16, height 5\' 10"  (1.778 m), weight 71.2 kg, SpO2 97 %. Body mass index is 22.53 kg/m.   Treatment Plan Summary: Daily contact with patient to assess and evaluate symptoms and progress in treatment, Medication management, and Plan patient is seen and examined.  Patient is a 35 year old male with the above-stated past psychiatric history who is seen in follow-up.  Diagnosis: 1.  Bipolar disorder, most recently manic, severe with psychotic features versus schizoaffective disorder; bipolar type 2.  History of polysubstance use disorders 3.  Multiple complaints of anxiety, but anxiety seems to be secondary to his psychosis.  Pertinent findings on examination: 1.  Auditory hallucinations continue 2.  Paranoia continues 3.  Anxiety continues  Plan: 1.  Stop gabapentin 2.  Continue Depakote ER 1000 mg p.o. nightly for mood stability. 3.  Increase Zyprexa Zydis to 25 mg p.o. nightly for mood stability, sleep and psychosis. 4.  Patient reports previous treatment with BuSpar and other similar medications without success of anxiety control. 5.  Addition of an SSRI antidepressant/antianxiety medication could be problematic with his previously diagnosed bipolar disorder. 6.  Previously treated with quetiapine as well as lithium but patient is expressed a strong desire not to return to either of those medicines. 7.  We will attempt to give Gabitril 4 mg p.o. daily and titrate for anxiety and mood stability 8.   Disposition planning-in progress.  Antonieta Pert, MD 09/28/2020, 3:22 PM

## 2020-09-28 NOTE — [facility] Counselor (Signed)
CSW provided the patient with a packet of resources that included: housing and shelter listings, free and reduced food resources, IRC information, GoodRX Cards, and suicide prevention information and phone numbers.  

## 2020-09-28 NOTE — [facility] Group Notes (Signed)
Adult Psychoeducational Group Note  Date:  09/28/2020 Time:  9:13 PM  Group Topic/Focus:  Coping With Mental Health Crisis:   The purpose of this group is to help patients identify strategies for coping with mental health crisis.  Group discusses possible causes of crisis and ways to manage them effectively.  Participation Level:  Minimal  Participation Quality:  Attentive  Affect:  Appropriate  Cognitive:  Appropriate  Insight: Improving  Engagement in Group:  Engaged  Modes of Intervention:  Discussion  Additional Comments  Brett House 09/28/2020, 9:13 PM

## 2020-09-28 NOTE — [facility] Suicide Risk Assessment (Signed)
[facility] INPATIENT:  Family/Significant Other Suicide Prevention Education  Suicide Prevention Education:  Education Completed; Wendi Snipes [phone] (Friend) has been identified by the patient as the family member/significant other with whom the patient will be residing, and identified as the person(s) who will aid the patient in the event of a mental health crisis (suicidal ideations/suicide attempt).  With written consent from the patient, the family member/significant other has been provided the following suicide prevention education, prior to the and/or following the discharge of the patient.  The suicide prevention education provided includes the following: Suicide risk factors Suicide prevention and interventions National Suicide Hotline telephone number [place] assessment telephone number [facility] Emergency Assistance [facility] and/or Residential Mobile Crisis Unit telephone number  Request made of family/significant other to: Remove weapons (e.g., guns, rifles, knives), all items previously/currently identified as safety concern.   Remove drugs/medications (over-the-counter, prescriptions, illicit drugs), all items previously/currently identified as a safety concern.  The family member/significant other verbalizes understanding of the suicide prevention education information provided.  The family member/significant other agrees to remove the items of safety concern listed above.  CSW spoke with Ms. Anderson who states that her friend has been using Alcohol and Methamphetamines for several years.  She states that her friend has been to at least 96 rehabs since the age of 59.  She states that he will not take his medications or remain in any type of treatment.  Ms. Dareen Piano states her friend has has had 3 suicide attempts since 2019 and has been taken from her home 4 times in the past year by ambulance for overdoses.  She states that the last time he was  taken from her home by ambulance was when he took a bottle of her heart medications.  She is not sure if this was to "get high or an overdose".  Ms. Dareen Piano states her friend stepped in front of a car a couple years ago in another suicide attempt.  She states that her friend is homeless and finds random people to start conversations with and then moves into their homes for a few days or weeks.  Ms. Dareen Piano states that her friend does not have any access to firearms or weapons.  CSW completed SPE with Ms. Anderson.     Metro Kung Fredie Majano 09/28/2020, 2:10 PM

## 2020-09-28 NOTE — [facility] Group Notes (Signed)
  Patient did not attend group.  SPIRITUALITY GROUP NOTE   Spirituality group facilitated by Chaplain Katy Dio Giller, BCC.   Group Description: Group focused on topic of hope. Patients participated in facilitated discussion around topic, connecting with one another around experiences and definitions for hope. Group members engaged with visual explorer photos, reflecting on what hope looks like for them today. Group engaged in discussion around how their definitions of hope are present today in hospital.   Modalities: Psycho-social ed, Adlerian, Narrative, MI    

## 2020-09-28 NOTE — [facility] Group Notes (Signed)
LCSW Group Therapy Note  Type of Therapy/Topic: Group Therapy: Six Dimensions of Wellness  Participation Level: Active  Description of Group: This group will address the concept of wellness and the six concepts of wellness: occupational, physical, social, intellectual, spiritual, and emotional. Patients will be encouraged to process areas in their lives that are out of balance and identify reasons for remaining unbalanced. Patients will be encouraged to explore ways to practice healthy habits daily to attain better physical and mental health outcomes.  Therapeutic Goals: 1. Identify aspects of wellness that they are doing well. 2. Identify aspects of wellness that they would like to improve upon. 3. Identify one action they can take to improve an aspect of wellness in their lives.   Summary of Patient Progress: Pt attended and participated in group.    Therapeutic Modalities: Cognitive Behavioral Therapy Solution-Focused Therapy Relapse Prevention   Fredirick Lathe, LCSWA Clinicial Social Worker [employer]

## 2020-09-28 NOTE — Progress Notes (Signed)
Adult Psychoeducational Group Note  Date:  09/28/2020 Time:  11:19 AM  Group Topic/Focus:  Goals Group:   The focus of this group is to help patients establish daily goals to achieve during treatment and discuss how the patient can incorporate goal setting into their daily lives to aide in recovery.  Participation Level:  Did Not Attend  [facility] 09/28/2020, 11:19 AM

## 2020-09-28 NOTE — Progress Notes (Signed)
Patient denied HI.  Hears voices calling his name.  Denied visual hallucinations.  Denied SI this morning.   Fair sleep, no sleep medicine.  Good appetite, high energy level, poor concentration.  Rated depression 8, hopeless and anxiety 7.  Denied withdrawals.  SI, contracts for safety.  Physical problems, pain, worst pain #4 in past 24 hours.  Pain medicine helpful.  Goal is take medicines that do not put him to sleep.  Calm down chatter.  Plans to talk to staff.  No discharge plans.   Medications administered per MD orders.  Emotional support and encouragement given patient. Safety maintained with 15 minute checks.

## 2020-09-28 NOTE — Progress Notes (Signed)
Recreation Therapy Notes  Date:  7.29.22 Time: 0930 Location: [address]  Group Topic: Stress Management  Goal Area(s) Addresses:  Patient will identify positive stress management techniques. Patient will identify benefits of using stress management post d/c.  Intervention: Stress Management  Activity :  LRT played a meditation that focused on dealing with procrastination.  Patients were to listen to the meditation as it played.  Education:  Stress Management, Discharge Planning.   Education Outcome: Acknowledges Education  Clinical Observations/Feedback:  Pt did not attend group session.    Caroll Rancher, LRT/CTRS         Caroll Rancher A 09/28/2020 12:34 PM

## 2020-09-28 NOTE — Plan of Care (Signed)
Nurse discussed coping skills with patient.  

## 2020-09-29 LAB — HEMOGLOBIN A1C
Hgb A1c MFr Bld: 5.2 % (ref 4.8–5.6)
Mean Plasma Glucose: 103 mg/dL

## 2020-09-29 NOTE — Progress Notes (Signed)
Pt did not attend orientation/goals group. 

## 2020-09-29 NOTE — Progress Notes (Signed)
[facility] MD Progress Note  09/29/2020 3:17 PM Brett House  MRN:  [id number] Subjective:  Patient is a 35 year old male with a reported past psychiatric history significant for bipolar disorder who originally presented to the [facility] emergency department on 09/21/2020 with auditory as well as visual hallucinations and suicidal and homicidal ideation.  Objective: Patient is seen and examined.  Patient is a 35 year old male with the above-stated past psychiatric history seen in follow-up.  He is actually doing better today.  He stated that his auditory hallucinations had almost disappeared.  He stated he slept better last night with the doxepin.  He stated that his anxiety had decreased as well.  He stated he was having a good day.  He denied any suicidal or homicidal ideation, and he stated he slept well last night as well as not being anxious.  His vital signs are stable, he is afebrile.  He slept 7.25 hours last night and denied any side effects from the doxepin.  No new laboratories.  No racing thoughts, no pressured speech.   Principal Problem: Bipolar I disorder, current episode depressed (HCC) Diagnosis: Principal Problem:   Bipolar I disorder, current episode depressed (HCC) Active Problems:   Bipolar disorder (HCC)  Total Time spent with patient: 20 minutes  Past Psychiatric History: See admission H&P  Past Medical History:  Past Medical History:  Diagnosis Date   Anxiety    Asthma    Bipolar disorder (HCC)    Depression    History reviewed. No pertinent surgical history. Family History: History reviewed. No pertinent family history. Family Psychiatric  History: See admission H&P Social History:  Social History   Substance and Sexual Activity  Alcohol Use Yes   Comment: 6 pack once a week for last 8-9 months     Social History   Substance and Sexual Activity  Drug Use Yes   Types: Marijuana   Comment: rarely    Social History   Socioeconomic History    Marital status: Single    Spouse name: Not on file   Number of children: 0   Years of education: Not on file   Highest education level: 12th grade  Occupational History   Occupation: Unemployed  Tobacco Use   Smoking status: Every Day    Packs/day: 1.50    Years: 15.00    Pack years: 22.50    Types: Cigarettes   Smokeless tobacco: Current  Vaping Use   Vaping Use: Never used  Substance and Sexual Activity   Alcohol use: Yes    Comment: 6 pack once a week for last 8-9 months   Drug use: Yes    Types: Marijuana    Comment: rarely   Sexual activity: Yes  Other Topics Concern   Not on file  Social History Narrative   Not on file   Social Determinants of Health   Financial Resource Strain: Not on file  Food Insecurity: Not on file  Transportation Needs: Not on file  Physical Activity: Not on file  Stress: Not on file  Social Connections: Not on file   Additional Social History:                         Sleep: Good  Appetite:  Good  Current Medications: Current Facility-Administered Medications  Medication Dose Route Frequency Provider Last Rate Last Admin   acetaminophen (TYLENOL) tablet 650 mg  650 mg Oral Q6H PRN Antonieta Pert, MD   [phone]  mg at 09/29/20 1422   alum & mag hydroxide-simeth (MAALOX/MYLANTA) 200-200-20 MG/5ML suspension 30 mL  30 mL Oral Q4H PRN Antonieta Pertlary, Ammar Moffatt Lawson, MD       divalproex (DEPAKOTE ER) 24 hr tablet 1,000 mg  1,000 mg Oral QHS Antonieta Pertlary, Kaedance Magos Lawson, MD   1,000 mg at 09/28/20 2114   doxepin (SINEQUAN) capsule 50 mg  50 mg Oral QHS Antonieta Pertlary, Aletheia Tangredi Lawson, MD   50 mg at 09/28/20 2118   hydrOXYzine (ATARAX/VISTARIL) tablet 25 mg  25 mg Oral TID PRN Antonieta Pertlary, Micah Barnier Lawson, MD   25 mg at 09/28/20 1300   OLANZapine zydis (ZYPREXA) disintegrating tablet 10 mg  10 mg Oral Q8H PRN Antonieta Pertlary, Olivette Beckmann Lawson, MD   10 mg at 09/27/20 1125   And   LORazepam (ATIVAN) tablet 1 mg  1 mg Oral Q6H PRN Antonieta Pertlary, Raywood Wailes Lawson, MD   1 mg at 09/26/20 1123   And    ziprasidone (GEODON) injection 10 mg  10 mg Intramuscular Q12H PRN Antonieta Pertlary, Barlow Harrison Lawson, MD       magnesium hydroxide (MILK OF MAGNESIA) suspension 30 mL  30 mL Oral Daily PRN Antonieta Pertlary, Frederick Marro Lawson, MD       nicotine polacrilex (NICORETTE) gum 2 mg  2 mg Oral PRN Antonieta Pertlary, Anarosa Kubisiak Lawson, MD   2 mg at 09/29/20 1422   OLANZapine zydis (ZYPREXA) disintegrating tablet 20 mg  20 mg Oral QHS Antonieta Pertlary, Etta Gassett Lawson, MD   20 mg at 09/28/20 2115   pneumococcal 23 valent vaccine (PNEUMOVAX-23) injection 0.5 mL  0.5 mL Intramuscular Tomorrow-1000 Antonieta Pertlary, Laryah Neuser Lawson, MD       tiaGABine (GABITRIL) tablet 4 mg  4 mg Oral QHS Antonieta Pertlary, Creedence Kunesh Lawson, MD   4 mg at 09/28/20 2146    Lab Results:  Results for orders placed or performed during the hospital encounter of 09/25/20 (from the past 48 hour(s))  Lipid panel     Status: Abnormal   Collection Time: 09/27/20  6:55 PM  Result Value Ref Range   Cholesterol 161 0 - 200 mg/dL   Triglycerides 161156 (H) <150 mg/dL   HDL 52 >09>40 mg/dL   Total CHOL/HDL Ratio 3.1 RATIO   VLDL 31 0 - 40 mg/dL   LDL Cholesterol 78 0 - 99 mg/dL    Comment:        Total Cholesterol/HDL:CHD Risk Coronary Heart Disease Risk Table                     Men   Women  1/2 Average Risk   3.4   3.3  Average Risk       5.0   4.4  2 X Average Risk   9.6   7.1  3 X Average Risk  23.4   11.0        Use the calculated Patient Ratio above and the CHD Risk Table to determine the patient's CHD Risk.        ATP III CLASSIFICATION (LDL):  <100     mg/dL   Optimal  [phone]  mg/dL   Near or Above                    Optimal  130-159  mg/dL   Borderline  [phone]  mg/dL   High  >478>190     mg/dL   Very High Performed at [facility], [address] [place], [place] [id number]   Hemoglobin A1c     Status: None  Collection Time: 09/27/20  6:55 PM  Result Value Ref Range   Hgb A1c MFr Bld 5.2 4.8 - 5.6 %    Comment: (NOTE)         Prediabetes: 5.7 - 6.4         Diabetes: >6.4         Glycemic  control for adults with diabetes: <7.0    Mean Plasma Glucose 103 mg/dL    Comment: (NOTE) Performed At: [facility] [address] [place], [place] [id number] Jolene Schimke MD NW:[id number]     Blood Alcohol level:  Lab Results  Component Value Date   [facility] <10 09/21/2020   ETH <10 10/05/2019    Metabolic Disorder Labs: Lab Results  Component Value Date   HGBA1C 5.2 09/27/2020   MPG 103 09/27/2020   MPG 102.54 01/24/2020   No results found for: PROLACTIN Lab Results  Component Value Date   CHOL 161 09/27/2020   TRIG 156 (H) 09/27/2020   HDL 52 09/27/2020   CHOLHDL 3.1 09/27/2020   VLDL 31 09/27/2020   LDLCALC 78 09/27/2020   LDLCALC 120 (H) 01/24/2020    Physical Findings: AIMS:  , ,  ,  ,    CIWA:    COWS:     Musculoskeletal: Strength & Muscle Tone: within normal limits Gait & Station: normal Patient leans: N/A  Psychiatric Specialty Exam:  Presentation  General Appearance: Appropriate for Environment  Eye Contact:Good  Speech:Normal Rate  Speech Volume:Normal  Handedness:Right   Mood and Affect  Mood:Anxious  Affect:Congruent   Thought Process  Thought Processes:Goal Directed  Descriptions of Associations:Intact  Orientation:Full (Time, Place and Person)  Thought Content:Paranoid Ideation  History of Schizophrenia/Schizoaffective disorder:No  Duration of Psychotic Symptoms:Less than six months  Hallucinations:Hallucinations: None Ideas of Reference:Paranoia; Delusions  Suicidal Thoughts:Suicidal Thoughts: No Homicidal Thoughts:Homicidal Thoughts: No  Sensorium  Memory:Immediate Fair; Recent Fair; Remote Fair  Judgment:Fair  Insight:Good   Executive Functions  Concentration:Fair  Attention Span:Fair  Recall:Fair  Fund of Knowledge:Fair  Language:Fair   Psychomotor Activity  Psychomotor Activity: Psychomotor Activity: Normal  Assets  Assets:Desire for Improvement; Resilience   Sleep   Sleep: Sleep: Good   Physical Exam: Physical Exam Vitals and nursing note reviewed.  Constitutional:      Appearance: Normal appearance.  HENT:     Head: Normocephalic and atraumatic.  Pulmonary:     Effort: Pulmonary effort is normal.  Neurological:     General: No focal deficit present.     Mental Status: He is alert and oriented to person, place, and time.   Review of Systems  All other systems reviewed and are negative. Blood pressure 111/64, pulse 99, temperature 98 F (36.7 C), temperature source Oral, resp. rate 16, height 5\' 10"  (1.778 m), weight 71.2 kg, SpO2 97 %. Body mass index is 22.53 kg/m.   Treatment Plan Summary: Daily contact with patient to assess and evaluate symptoms and progress in treatment, Medication management, and Plan patient is seen and examined.  Patient is a 35 year old male with the above-stated past psychiatric history who is seen in follow-up.  Diagnosis: 1.  Bipolar disorder, most recently manic, severe with psychotic features versus schizoaffective disorder; bipolar type 2.  History of polysubstance use disorders 3.  Multiple complaints of anxiety, but anxiety seems to be secondary to his psychosis.  Pertinent findings on examination today: 1.  Auditory hallucinations have essentially ceased. 2.  Anxiety is decreased. 3.  Sleep is improved. 4.  No suicidal or homicidal  ideation.  Plan: 1.  Continue Depakote ER 1000 mg p.o. nightly for mood stability. 2.  Continue doxepin 50 mg p.o. nightly for sleep. 4.  Continue hydroxyzine 25 mg p.o. 3 times daily as needed anxiety. 5.  Continue Zyprexa Zydis agitation protocol as needed. 6.  Continue Zyprexa 20 mg p.o. nightly for mood stability, sleep and psychosis. 7.  Continue Gabitril 4 mg p.o. nightly for mood stability and anxiety. 8.  Get Depakote level, CBC with differential and metabolic panel prior to discharge. 9.  Disposition planning-in progress.  Antonieta Pert, MD 09/29/2020,  3:17 PM

## 2020-09-29 NOTE — Progress Notes (Signed)
Adult Psychoeducational Group Note  Date:  09/29/2020 Time:  9:18 PM  Group Topic/Focus:  Wrap-Up Group:   The focus of this group is to help patients review their daily goal of treatment and discuss progress on daily workbooks.  Participation Level:  Active  Participation Quality:  Appropriate and Attentive  Affect:  Appropriate  Cognitive:  Alert and Appropriate  Insight: Appropriate  Engagement in Group:  Engaged and Supportive  Modes of Intervention:  Discussion and Support  Additional Comments: Pt articulated that his goal today was to focus on his treatment plan and receive a project date for discharge. This goal was accomplished. Pt stated he did talk with staff about his treatment. Pt conveyed he made a phone call to his mother and reported relationship with his family and support system was improving. Pt reported he took all medications provided and attended all meal services with 100% intake. Pt said his appetite was good today. Pt evaluated his sleep last night as good. Pt verbalized he felt the same about himself and rated his overall day a 6 out of 10 on this date. Pt articulated he had some lower back pain. This Clinical research associate informed nurse of medical issues. Pt denies no auditory or visual hallucinations or thoughts of harming himself or others. Pt said he would alert staff if anything changed. End of Wrap-Up Group progress report.     Nicoletta Dress 09/29/2020, 9:18 PM

## 2020-09-29 NOTE — Plan of Care (Signed)
  Problem: Activity: Goal: Will verbalize the importance of balancing activity with adequate rest periods Outcome: Progressing   Problem: Education: Goal: Knowledge of the prescribed therapeutic regimen will improve Outcome: Progressing   Problem: Coping: Goal: Coping ability will improve Outcome: Progressing   

## 2020-09-29 NOTE — Progress Notes (Signed)
    D:  Pt presents with moderate anxiety.  Pt attended evening group.  Pt denies HI.  Pt reports SI, but verbally contracts for safety.  Pt denies VH.  Pt reports AH stating, "their just calling names."  A:  Labs/Vitals monitored; Medication education provided; Pt encouraged to communicate concerns.  R:  Pt remains safe with Q 15 minute checks.  Will continue POC.

## 2020-09-29 NOTE — [facility] Group Notes (Signed)
  [facility] LCSW Group Therapy Note  Date/Time:  09/29/2020 11:15AM-12:00PM  Type of Therapy and Topic:  Group Therapy:  Feelings About Hospitalization  Participation Level:  Minimal   Description of Group This process group involved patients discussing their feelings related to being hospitalized, as well as the benefits they see to being in the hospital.  These feelings and benefits were itemized.  The group then brainstormed specific ways in which they could seek those same benefits when they discharge and return home.  Therapeutic Goals Patient will identify and describe positive and negative feelings related to hospitalization Patient will verbalize benefits of hospitalization to themselves personally Patients will brainstorm together ways they can obtain similar benefits in the outpatient setting, identify barriers to wellness and possible solutions  Summary of Patient Progress:  The patient expressed his primary feelings about being hospitalized are "okay" because he is here to work on his medicines and get them right.  He stated he has no suicidal ideation which is a big positive.  He did not participate in the remainder of group and remained looking straight ahead, not tracking the discussion with his eyes.  Therapeutic Modalities Cognitive Behavioral Therapy Motivational Interviewing    Ambrose Mantle, LCSW 09/29/2020, 9:31 AM

## 2020-09-29 NOTE — Progress Notes (Signed)
Pt reports passive SI, but he contracts for safety on the unit.  Pt has taken medications without incident and no adverse reactions were noted. Pt has been calm and cooperative this shift. RN will continue to monitor and provide support as needed.  09/29/20 1015  Psych Admission Type (Psych Patients Only)  Admission Status Voluntary  Psychosocial Assessment  Patient Complaints Anxiety  Eye Contact Brief  Facial Expression Anxious  Affect Anxious;Irritable  Speech Logical/coherent  Interaction Minimal;Guarded  Motor Activity Slow  Appearance/Hygiene Unremarkable  Behavior Characteristics Cooperative;Anxious;Guarded  Mood Depressed;Labile  Thought Process  Coherency Circumstantial  Content WDL  Delusions None reported or observed  Perception Hallucinations  Hallucination Auditory  Judgment Impaired  Confusion None  Danger to Self  Current suicidal ideation? Passive  Self-Injurious Behavior No self-injurious ideation or behavior indicators observed or expressed   Agreement Not to Harm Self Yes  Description of Agreement pt verbally contracts for safety  Danger to Others  Danger to Others None reported or observed

## 2020-09-29 NOTE — Progress Notes (Signed)
      09/29/20 2110  Psych Admission Type (Psych Patients Only)  Admission Status Voluntary  Psychosocial Assessment  Patient Complaints Anxiety;Depression  Eye Contact Brief  Facial Expression Anxious  Affect Anxious;Irritable  Speech Logical/coherent  Interaction Minimal;Guarded  Motor Activity Slow  Appearance/Hygiene Unremarkable  Behavior Characteristics Cooperative;Anxious;Guarded  Mood Depressed;Pleasant  Thought Process  Coherency Circumstantial  Content WDL  Delusions None reported or observed  Perception Hallucinations  Hallucination Auditory (voices whispering his name)  Judgment Impaired  Confusion None  Danger to Self  Current suicidal ideation? Passive  Self-Injurious Behavior No self-injurious ideation or behavior indicators observed or expressed   Agreement Not to Harm Self Yes  Description of Agreement pt verbally contracts for safety  Danger to Others  Danger to Others None reported or observed

## 2020-09-30 MED ORDER — OLANZAPINE 10 MG PO TABS
20.0000 mg | ORAL_TABLET | Freq: Every day | ORAL | Status: DC
  Filled 2020-09-30: qty 14

## 2020-09-30 NOTE — [facility] Group Notes (Signed)
[facility] LCSW Group Therapy Note  Date/Time:  09/30/2020  11:00AM-12:00PM  Type of Therapy and Topic:  Group Therapy:  Music and Mood  Participation Level:  Active   Description of Group: In this process group, members listened to a variety of genres of music and identified that different types of music evoke different responses.  Patients were encouraged to identify music that was soothing for them and music that was energizing for them.  Patients discussed how this knowledge can help with wellness and recovery in various ways including managing depression and anxiety as well as encouraging healthy sleep habits.    Therapeutic Goals: Patients will explore the impact of different varieties of music on mood Patients will verbalize the thoughts they have when listening to different types of music Patients will identify music that is soothing to them as well as music that is energizing to them Patients will discuss how to use this knowledge to assist in maintaining wellness and recovery Patients will explore the use of music as a coping skill  Summary of Patient Progress:  At the beginning of group, patient expressed that he felt alright but antsy.  He was observed to be anxious during group, eventually left to get some medicine.  He did say several songs were relaxing.  At the end of group, patient expressed that he was still feeling antsy but was glad to have gotten some medicine, anticipated feeling better soon.    Therapeutic Modalities: Solution Focused Brief Therapy Activity   Ambrose Mantle, LCSW

## 2020-09-30 NOTE — Progress Notes (Signed)
   09/30/20 2056  Psych Admission Type (Psych Patients Only)  Admission Status Voluntary  Psychosocial Assessment  Patient Complaints Anxiety  Eye Contact Brief  Facial Expression Anxious  Affect Anxious;Appropriate to circumstance  Speech Logical/coherent  Interaction Minimal;Guarded  Motor Activity Other (Comment) (WDL)  Appearance/Hygiene Unremarkable  Behavior Characteristics Cooperative;Appropriate to situation  Mood Depressed  Thought Process  Coherency Circumstantial  Content WDL  Delusions None reported or observed  Perception WDL  Hallucination None reported or observed  Judgment Impaired  Confusion None  Danger to Self  Current suicidal ideation? Denies  Self-Injurious Behavior No self-injurious ideation or behavior indicators observed or expressed   Agreement Not to Harm Self Yes  Description of Agreement Verbal contract  Danger to Others  Danger to Others None reported or observed

## 2020-09-30 NOTE — Progress Notes (Signed)
[facility] MD Progress Note  09/30/2020 2:20 PM Brett House  MRN:  [id number] Subjective:  Patient is a 35 year old male with a reported past psychiatric history significant for bipolar disorder who originally presented to the [facility] emergency department on 09/21/2020 with auditory as well as visual hallucinations and suicidal and homicidal ideation.  Objective: Patient is seen and examined.  Patient is a 35 year old male with the above-stated past psychiatric history who is seen in follow-up.  He continues to slowly improve.  He became a bit irritable this morning but was able to cope with that.  No auditory or visual loose Nations.  No suicidal or homicidal ideation.  We discussed the fact that he is essentially homeless, and although he wanted to be discharged today we discussed hanging around at least till tomorrow so that we can get him referred to a shelter or one of his relatives who is willing to let him come stay with their family.  He denied any side effects to his current medications.  No new laboratories.  His vital signs are stable, he is afebrile.  He slept 7 hours last night.  Principal Problem: Bipolar I disorder, current episode depressed (HCC) Diagnosis: Principal Problem:   Bipolar I disorder, current episode depressed (HCC) Active Problems:   Bipolar disorder (HCC)  Total Time spent with patient: 20 minutes  Past Psychiatric History: See admission H&P  Past Medical History:  Past Medical History:  Diagnosis Date   Anxiety    Asthma    Bipolar disorder (HCC)    Depression    History reviewed. No pertinent surgical history. Family History: History reviewed. No pertinent family history. Family Psychiatric  History: See admission H&P Social History:  Social History   Substance and Sexual Activity  Alcohol Use Yes   Comment: 6 pack once a week for last 8-9 months     Social History   Substance and Sexual Activity  Drug Use Yes   Types: Marijuana    Comment: rarely    Social History   Socioeconomic History   Marital status: Single    Spouse name: Not on file   Number of children: 0   Years of education: Not on file   Highest education level: 12th grade  Occupational History   Occupation: Unemployed  Tobacco Use   Smoking status: Every Day    Packs/day: 1.50    Years: 15.00    Pack years: 22.50    Types: Cigarettes   Smokeless tobacco: Current  Vaping Use   Vaping Use: Never used  Substance and Sexual Activity   Alcohol use: Yes    Comment: 6 pack once a week for last 8-9 months   Drug use: Yes    Types: Marijuana    Comment: rarely   Sexual activity: Yes  Other Topics Concern   Not on file  Social History Narrative   Not on file   Social Determinants of Health   Financial Resource Strain: Not on file  Food Insecurity: Not on file  Transportation Needs: Not on file  Physical Activity: Not on file  Stress: Not on file  Social Connections: Not on file   Additional Social History:                         Sleep: Good  Appetite:  Good  Current Medications: Current Facility-Administered Medications  Medication Dose Route Frequency Provider Last Rate Last Admin   acetaminophen (TYLENOL) tablet 650 mg  650 mg Oral Q6H PRN Antonieta Pert, MD   650 mg at 09/29/20 2115   alum & mag hydroxide-simeth (MAALOX/MYLANTA) 200-200-20 MG/5ML suspension 30 mL  30 mL Oral Q4H PRN Antonieta Pert, MD       divalproex (DEPAKOTE ER) 24 hr tablet 1,000 mg  1,000 mg Oral QHS Antonieta Pert, MD   1,000 mg at 09/29/20 2110   doxepin (SINEQUAN) capsule 50 mg  50 mg Oral QHS Antonieta Pert, MD   50 mg at 09/29/20 2111   hydrOXYzine (ATARAX/VISTARIL) tablet 25 mg  25 mg Oral TID PRN Antonieta Pert, MD   25 mg at 09/29/20 1625   OLANZapine zydis (ZYPREXA) disintegrating tablet 10 mg  10 mg Oral Q8H PRN Antonieta Pert, MD   10 mg at 09/30/20 1143   And   LORazepam (ATIVAN) tablet 1 mg  1 mg Oral Q6H PRN  Antonieta Pert, MD   1 mg at 09/26/20 1123   And   ziprasidone (GEODON) injection 10 mg  10 mg Intramuscular Q12H PRN Antonieta Pert, MD       magnesium hydroxide (MILK OF MAGNESIA) suspension 30 mL  30 mL Oral Daily PRN Antonieta Pert, MD       nicotine polacrilex (NICORETTE) gum 2 mg  2 mg Oral PRN Antonieta Pert, MD   2 mg at 09/30/20 1144   OLANZapine zydis (ZYPREXA) disintegrating tablet 20 mg  20 mg Oral QHS Antonieta Pert, MD   20 mg at 09/29/20 2112   pneumococcal 23 valent vaccine (PNEUMOVAX-23) injection 0.5 mL  0.5 mL Intramuscular Tomorrow-1000 Antonieta Pert, MD       tiaGABine (GABITRIL) tablet 4 mg  4 mg Oral QHS Antonieta Pert, MD   4 mg at 09/29/20 2112    Lab Results: No results found for this or any previous visit (from the past 48 hour(s)).  Blood Alcohol level:  Lab Results  Component Value Date   ETH <10 09/21/2020   ETH <10 10/05/2019    Metabolic Disorder Labs: Lab Results  Component Value Date   HGBA1C 5.2 09/27/2020   MPG 103 09/27/2020   MPG 102.54 01/24/2020   No results found for: PROLACTIN Lab Results  Component Value Date   CHOL 161 09/27/2020   TRIG 156 (H) 09/27/2020   HDL 52 09/27/2020   CHOLHDL 3.1 09/27/2020   VLDL 31 09/27/2020   LDLCALC 78 09/27/2020   LDLCALC 120 (H) 01/24/2020    Physical Findings: AIMS:  , ,  ,  ,    CIWA:    COWS:     Musculoskeletal: Strength & Muscle Tone: within normal limits Gait & Station: normal Patient leans: N/A  Psychiatric Specialty Exam:  Presentation  General Appearance: Appropriate for Environment  Eye Contact:Good  Speech:Normal Rate  Speech Volume:Normal  Handedness:Right   Mood and Affect  Mood:Anxious  Affect:Congruent   Thought Process  Thought Processes:Goal Directed  Descriptions of Associations:Intact  Orientation:Full (Time, Place and Person)  Thought Content:Paranoid Ideation  History of Schizophrenia/Schizoaffective  disorder:No  Duration of Psychotic Symptoms:Less than six months  Hallucinations:Hallucinations: None  Ideas of Reference:Paranoia; Delusions  Suicidal Thoughts:Suicidal Thoughts: No  Homicidal Thoughts:Homicidal Thoughts: No   Sensorium  Memory:Immediate Fair; Recent Fair; Remote Fair  Judgment:Fair  Insight:Good   Executive Functions  Concentration:Fair  Attention Span:Fair  Recall:Fair  Fund of Knowledge:Fair  Language:Fair   Psychomotor Activity  Psychomotor Activity:Psychomotor Activity: Normal   Assets  Assets:Desire  for Improvement; Resilience   Sleep  Sleep:Sleep: Good    Physical Exam: Physical Exam Vitals and nursing note reviewed.  Constitutional:      Appearance: Normal appearance.  HENT:     Head: Normocephalic and atraumatic.  Pulmonary:     Effort: Pulmonary effort is normal.  Neurological:     General: No focal deficit present.     Mental Status: He is alert and oriented to person, place, and time.   Review of Systems  All other systems reviewed and are negative. Blood pressure 103/74, pulse 97, temperature 98 F (36.7 C), temperature source Oral, resp. rate 16, height 5\' 10"  (1.778 m), weight 71.2 kg, SpO2 99 %. Body mass index is 22.53 kg/m.   Treatment Plan Summary: Daily contact with patient to assess and evaluate symptoms and progress in treatment, Medication management, and Plan patient is seen and examined.  Patient is a 35 year old male with the above-stated past psychiatric history who is seen in follow-up.  Diagnosis: 1.  Bipolar disorder, most recently manic, severe with psychotic features versus schizoaffective disorder; bipolar type 2.  History of polysubstance use disorders 3.  Multiple complaints of anxiety, but anxiety seems to be secondary to his psychosis.   Pertinent findings on examination today: 1.  Auditory hallucinations remain gone. 2.  No evidence of suicidal or homicidal ideation. 3.  Sleep is  improved. 4.  No complaints of anxiety today.  Plan: 1.  Continue Depakote ER 1000 mg p.o. nightly for mood stability. 2.  Continue doxepin 50 mg p.o. nightly for sleep. 4.  Continue hydroxyzine 25 mg p.o. 3 times daily as needed anxiety. 5.  Continue Zyprexa Zydis agitation protocol as needed. 6.  Continue Zyprexa 20 mg p.o. nightly for mood stability, sleep and psychosis. 7.  Continue Gabitril 4 mg p.o. nightly for mood stability and anxiety. 8.  Get Depakote level, CBC with differential and metabolic panel in a.m. tomorrow.. 9.  Disposition planning-in progress.  31, MD 09/30/2020, 2:20 PM

## 2020-10-01 LAB — CBC WITH DIFFERENTIAL/PLATELET
Abs Immature Granulocytes: 0.33 10*3/uL — ABNORMAL HIGH (ref 0.00–0.07)
Basophils Absolute: 0.1 10*3/uL (ref 0.0–0.1)
Basophils Relative: 1 %
Eosinophils Absolute: 0.5 10*3/uL (ref 0.0–0.5)
Eosinophils Relative: 6 %
HCT: 42.5 % (ref 39.0–52.0)
Hemoglobin: 14.2 g/dL (ref 13.0–17.0)
Immature Granulocytes: 4 %
Lymphocytes Relative: 24 %
Lymphs Abs: 2.1 10*3/uL (ref 0.7–4.0)
MCH: 30.6 pg (ref 26.0–34.0)
MCHC: 33.4 g/dL (ref 30.0–36.0)
MCV: 91.6 fL (ref 80.0–100.0)
Monocytes Absolute: 0.8 10*3/uL (ref 0.1–1.0)
Monocytes Relative: 9 %
Neutro Abs: 4.9 10*3/uL (ref 1.7–7.7)
Neutrophils Relative %: 56 %
Platelets: 271 10*3/uL (ref 150–400)
RBC: 4.64 MIL/uL (ref 4.22–5.81)
RDW: 12.9 % (ref 11.5–15.5)
WBC: 8.8 10*3/uL (ref 4.0–10.5)
nRBC: 0 % (ref 0.0–0.2)

## 2020-10-01 LAB — COMPREHENSIVE METABOLIC PANEL
ALT: 31 U/L (ref 0–44)
AST: 32 U/L (ref 15–41)
Albumin: 4.4 g/dL (ref 3.5–5.0)
Alkaline Phosphatase: 51 U/L (ref 38–126)
Anion gap: 13 (ref 5–15)
BUN: 16 mg/dL (ref 6–20)
CO2: 22 mmol/L (ref 22–32)
Calcium: 9.2 mg/dL (ref 8.9–10.3)
Chloride: 105 mmol/L (ref 98–111)
Creatinine, Ser: 0.67 mg/dL (ref 0.61–1.24)
GFR, Estimated: >60 mL/min (ref >60)
Glucose, Bld: 90 mg/dL (ref 70–99)
Potassium: 4 mmol/L (ref 3.5–5.1)
Sodium: 140 mmol/L (ref 135–145)
Total Bilirubin: 0.5 mg/dL (ref 0.3–1.2)
Total Protein: 6.9 g/dL (ref 6.5–8.1)

## 2020-10-01 LAB — VALPROIC ACID LEVEL: Valproic Acid Lvl: 66 ug/mL (ref 50.0–100.0)

## 2020-10-01 MED ORDER — DIVALPROEX SODIUM ER 500 MG PO TB24
1000.0000 mg | ORAL_TABLET | Freq: Every day | ORAL | 0 refills | Status: DC
Start: 2020-10-01 — End: 2020-10-01

## 2020-10-01 MED ORDER — TIAGABINE HCL 4 MG PO TABS: 4.0000 mg | ORAL_TABLET | Freq: Every day | ORAL | 0 refills | Status: DC

## 2020-10-01 MED ORDER — DOXEPIN HCL 50 MG PO CAPS: 50.0000 mg | ORAL_CAPSULE | Freq: Every day | ORAL | 0 refills | Status: DC

## 2020-10-01 MED ORDER — DIVALPROEX SODIUM ER 500 MG PO TB24: 1000.0000 mg | ORAL_TABLET | Freq: Every day | ORAL | 0 refills | Status: DC

## 2020-10-01 MED ORDER — NICOTINE POLACRILEX 2 MG MT GUM: 2.0000 mg | CHEWING_GUM | OROMUCOSAL | 0 refills | Status: DC | PRN

## 2020-10-01 MED ORDER — OLANZAPINE 20 MG PO TBDP: 20.0000 mg | ORAL_TABLET | Freq: Every day | ORAL | 0 refills | Status: DC

## 2020-10-01 NOTE — Plan of Care (Signed)
Patient was able to identify some coping strategies in regards to achieving goals at completion of recreation therapy group sessions.    Caroll Rancher, LRT/CTRS

## 2020-10-01 NOTE — Progress Notes (Signed)
  [facility] Adult Case Management Discharge Plan :  Will you be returning to the same living situation after discharge:  No. Will be discharged to shelter At discharge, do you have transportation home?: No. Safe Transport will be arranged Do you have the ability to pay for your medications: No. Samples to be provided at discharge  Release of information consent forms completed and in the chart;  Patient's signature needed at discharge.  Patient to Follow up at:  Follow-up Information     [facility]. Go to.   Specialty: Behavioral Health Why: Please go to this provider for therapy and medication management services during walk in hours:  Monday through Wednesday, from 8:00 am to 11:00 am.  Services are provided on a first come, first served basis. Contact information: [address] [place] [address] [phone]                Next level of care provider has access to [facility] Link:yes  Safety Planning and Suicide Prevention discussed: Yes,  with friend     Has patient been referred to the Quitline?: Patient refused referral  Patient has been referred for addiction treatment: Pt. refused referral  Otelia Santee, LCSW 10/01/2020, 10:03 AM

## 2020-10-01 NOTE — Plan of Care (Signed)
  Problem: Role Relationship: Goal: Ability to communicate needs accurately will improve Outcome: Progressing Goal: Ability to interact with others will improve Outcome: Progressing

## 2020-10-01 NOTE — Progress Notes (Signed)
Recreation Therapy Notes  Date: 8.1.22 Time: 1000 Location: 500 [address] Day Room  Group Topic: Goal Setting  Goal Area(s) Addresses:  Patient will participate in discussion of what a goal is. Patient will successfully complete worksheet breaking down goals into time blocks.  Behavioral Response: Engaged  Intervention: Group Conversation, Worksheet  Activity: Group started with a discussion about group rules. Patients had a group conversation on SMART goals, and what the acronym stands for; specific, measurable, attainable, relevant, and time-bound. Patients were given a worksheet that breaks down (week, month, year and 5 years) when patients want to complete goals. Patients then had to explain the barriers that would be encountered, what will be needed to complete goals and what they can begin doing tomorrow to work towards goals.   Education:  Education on [employer]  Education Outcome: Acknowledges education  Clinical Observations/Feedback: Patient was appropriate and expressed in a week wanting to have somewhere to live.  In a month, get job back.  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  Patient identified an obstacle to these goals as "self medicating and not having access to community resources".  Lastly, patient expressed reaching out to community resources that are available to him.    Caroll Rancher, LRT/CTRS       Caroll Rancher A 10/01/2020 12:20 PM

## 2020-10-01 NOTE — Plan of Care (Signed)
Discharge note  Patient verbalizes readiness for discharge. Follow up plan explained, AVS, Transition record and SRA given. Prescriptions and teaching provided. Belongings returned and signed for. Suicide safety plan completed and signed. Patient verbalizes understanding. Patient denies SI/HI and assures this Clinical research associate they will seek assistance should that change. Patient discharged to lobby where safe transport was waiting.  Problem: Activity: Goal: Will verbalize the importance of balancing activity with adequate rest periods Outcome: Adequate for Discharge   Problem: Education: Goal: Will be free of psychotic symptoms Outcome: Adequate for Discharge Goal: Knowledge of the prescribed therapeutic regimen will improve Outcome: Adequate for Discharge   Problem: Coping: Goal: Coping ability will improve Outcome: Adequate for Discharge Goal: Will verbalize feelings Outcome: Adequate for Discharge   Problem: Health Behavior/Discharge Planning: Goal: Compliance with prescribed medication regimen will improve Outcome: Adequate for Discharge   Problem: Nutritional: Goal: Ability to achieve adequate nutritional intake will improve Outcome: Adequate for Discharge   Problem: Role Relationship: Goal: Ability to communicate needs accurately will improve 10/01/2020 1139 by Raylene Miyamoto, RN Outcome: Adequate for Discharge 10/01/2020 0933 by Raylene Miyamoto, RN Outcome: Progressing Goal: Ability to interact with others will improve 10/01/2020 1139 by Raylene Miyamoto, RN Outcome: Adequate for Discharge 10/01/2020 0933 by Raylene Miyamoto, RN Outcome: Progressing   Problem: Safety: Goal: Ability to redirect hostility and anger into socially appropriate behaviors will improve Outcome: Adequate for Discharge Goal: Ability to remain free from injury will improve Outcome: Adequate for Discharge   Problem: Self-Care: Goal: Ability to participate in self-care as condition permits will  improve Outcome: Adequate for Discharge   Problem: Self-Concept: Goal: Will verbalize positive feelings about self Outcome: Adequate for Discharge

## 2020-10-01 NOTE — Progress Notes (Signed)
Recreation Therapy Notes  INPATIENT RECREATION TR PLAN  Patient Details Name: Mykel Sponaugle MRN: [id number] DOB: 11/02/1985 Today's Date: 10/01/2020  Rec Therapy Plan Is patient appropriate for Therapeutic Recreation?: Yes Treatment times per week: about 3 days Estimated Length of Stay: 5-7 days TR Treatment/Interventions: Group participation (Comment)  Discharge Criteria Pt will be discharged from therapy if:: Discharged Treatment plan/goals/alternatives discussed and agreed upon by:: Patient/family  Discharge Summary Short term goals set: See patient care plan Short term goals met: Adequate for discharge Progress toward goals comments: Groups attended Which groups?: Communication, Goal setting Reason goals not met: Patient moved to a different hall. Therapeutic equipment acquired: N/A Reason patient discharged from therapy: Discharge from hospital Pt/family agrees with progress & goals achieved: Yes Date patient discharged from therapy: 10/01/20    Victorino Sparrow, LRT/CTRS  Ria Comment, [place] 10/01/2020, 12:30 PM
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
## 2020-10-01 NOTE — [facility]HH Suicide Risk Assessment (Signed)
[facility] Discharge Suicide Risk Assessment   Principal Problem: Bipolar I disorder, current episode depressed (HCC) Discharge Diagnoses: Principal Problem:   Bipolar I disorder, current episode depressed (HCC) Active Problems:   Bipolar disorder (HCC)   Total Time spent with patient: 15 minutes  Musculoskeletal: Strength & Muscle Tone: within normal limits Gait & Station: normal Patient leans: N/A  Psychiatric Specialty Exam  Presentation  General Appearance: Fairly Groomed  Eye Contact:Good  Speech:Normal Rate  Speech Volume:Normal  Handedness:Right   Mood and Affect  Mood:Euthymic  Duration of Depression Symptoms: Less than two weeks  Affect:Appropriate   Thought Process  Thought Processes:Goal Directed  Descriptions of Associations:Intact  Orientation:Full (Time, Place and Person)  Thought Content:Delusions  History of Schizophrenia/Schizoaffective disorder:Yes  Duration of Psychotic Symptoms:Greater than six months  Hallucinations:Hallucinations: None  Ideas of Reference:Delusions  Suicidal Thoughts:Suicidal Thoughts: No  Homicidal Thoughts:Homicidal Thoughts: No   Sensorium  Memory:Immediate Fair; Recent Fair; Remote Fair  Judgment:Fair  Insight:Fair   Executive Functions  Concentration:Good  Attention Span:Good  Recall:Fair  Fund of Knowledge:Good  Language:Good   Psychomotor Activity  Psychomotor Activity:Psychomotor Activity: Normal   Assets  Assets:Desire for Improvement; Resilience   Sleep  Sleep:Sleep: Good   Physical Exam: Physical Exam Vitals and nursing note reviewed.  Constitutional:      Appearance: Normal appearance.  HENT:     Head: Normocephalic and atraumatic.  Pulmonary:     Effort: Pulmonary effort is normal.  Neurological:     General: No focal deficit present.     Mental Status: He is alert and oriented to person, place, and time.   Review of Systems  All other systems reviewed and are  negative. Blood pressure 109/75, pulse 100, temperature (!) 97.5 F (36.4 C), temperature source Oral, resp. rate 16, height 5\' 10"  (1.778 m), weight 71.2 kg, SpO2 98 %. Body mass index is 22.53 kg/m.  Mental Status Per Nursing Assessment::   On Admission:  Self-harm behaviors  Demographic Factors:  Male, Divorced or widowed, Caucasian, Low socioeconomic status, Living alone, and Unemployed  Loss Factors: Financial problems/change in socioeconomic status  Historical Factors: Impulsivity  Risk Reduction Factors:   NA  Continued Clinical Symptoms:  Schizophrenia:   Less than 7 years old Paranoid or undifferentiated type  Cognitive Features That Contribute To Risk:  Thought constriction (tunnel vision)    Suicide Risk:  Minimal: No identifiable suicidal ideation.  Patients presenting with no risk factors but with morbid ruminations; may be classified as minimal risk based on the severity of the depressive symptoms   Follow-up Information     [facility]. Go to.   Specialty: Behavioral Health Why: Please go to this provider for therapy and medication management services during walk in hours:  Monday through Wednesday, from 8:00 am to 11:00 am.  Services are provided on a first come, first served basis. Contact information: [address] [place] [phone]                Plan Of Care/Follow-up recommendations:  Activity:  ad lib  [phone], MD 10/01/2020, 9:39 AM

## 2020-10-06 ENCOUNTER — Other Ambulatory Visit: Payer: Self-pay

## 2020-10-06 ENCOUNTER — Emergency Department (HOSPITAL_COMMUNITY)
Admission: EM | Admit: 2020-10-06 | Discharge: 2020-10-07 | Disposition: A | Discharge status: Home / Self Care | Payer: Self-pay | Attending: Student | Admitting: Student

## 2020-10-06 ENCOUNTER — Encounter (HOSPITAL_COMMUNITY): Payer: Self-pay | Admitting: Emergency Medicine

## 2020-10-06 DIAGNOSIS — Z20822 Contact with and (suspected) exposure to covid-19: Secondary | ICD-10-CM | POA: Insufficient documentation

## 2020-10-06 DIAGNOSIS — Y900 Blood alcohol level of less than 20 mg/100 ml: Secondary | ICD-10-CM | POA: Insufficient documentation

## 2020-10-06 DIAGNOSIS — F17210 Nicotine dependence, cigarettes, uncomplicated: Secondary | ICD-10-CM | POA: Insufficient documentation

## 2020-10-06 DIAGNOSIS — F1914 Other psychoactive substance abuse with psychoactive substance-induced mood disorder: Secondary | ICD-10-CM | POA: Insufficient documentation

## 2020-10-06 DIAGNOSIS — F1994 Other psychoactive substance use, unspecified with psychoactive substance-induced mood disorder: Secondary | ICD-10-CM | POA: Diagnosis present

## 2020-10-06 DIAGNOSIS — R443 Hallucinations, unspecified: Secondary | ICD-10-CM

## 2020-10-06 DIAGNOSIS — R45851 Suicidal ideations: Secondary | ICD-10-CM | POA: Insufficient documentation

## 2020-10-06 DIAGNOSIS — F1510 Other stimulant abuse, uncomplicated: Secondary | ICD-10-CM | POA: Diagnosis present

## 2020-10-06 DIAGNOSIS — J45909 Unspecified asthma, uncomplicated: Secondary | ICD-10-CM | POA: Insufficient documentation

## 2020-10-06 DIAGNOSIS — F315 Bipolar disorder, current episode depressed, severe, with psychotic features: Secondary | ICD-10-CM | POA: Insufficient documentation

## 2020-10-06 LAB — ACETAMINOPHEN LEVEL: Acetaminophen (Tylenol), Serum: <10 ug/mL — ABNORMAL LOW (ref 10–30)

## 2020-10-06 LAB — COMPREHENSIVE METABOLIC PANEL
ALT: 31 U/L (ref 0–44)
AST: 26 U/L (ref 15–41)
Albumin: 4.9 g/dL (ref 3.5–5.0)
Alkaline Phosphatase: 60 U/L (ref 38–126)
Anion gap: 12 (ref 5–15)
BUN: 12 mg/dL (ref 6–20)
CO2: 25 mmol/L (ref 22–32)
Calcium: 8.9 mg/dL (ref 8.9–10.3)
Chloride: 100 mmol/L (ref 98–111)
Creatinine, Ser: 0.62 mg/dL (ref 0.61–1.24)
GFR, Estimated: >60 mL/min (ref >60)
Glucose, Bld: 104 mg/dL — ABNORMAL HIGH (ref 70–99)
Potassium: 3.1 mmol/L — ABNORMAL LOW (ref 3.5–5.1)
Sodium: 137 mmol/L (ref 135–145)
Total Bilirubin: 1.3 mg/dL — ABNORMAL HIGH (ref 0.3–1.2)
Total Protein: 7.5 g/dL (ref 6.5–8.1)

## 2020-10-06 LAB — CBC
HCT: 40.3 % (ref 39.0–52.0)
Hemoglobin: 13.7 g/dL (ref 13.0–17.0)
MCH: 30.8 pg (ref 26.0–34.0)
MCHC: 34 g/dL (ref 30.0–36.0)
MCV: 90.6 fL (ref 80.0–100.0)
Platelets: 286 10*3/uL (ref 150–400)
RBC: 4.45 MIL/uL (ref 4.22–5.81)
RDW: 12.7 % (ref 11.5–15.5)
WBC: 7.1 10*3/uL (ref 4.0–10.5)
nRBC: 0 % (ref 0.0–0.2)

## 2020-10-06 LAB — ETHANOL: Alcohol, Ethyl (B): <10 mg/dL (ref <10)

## 2020-10-06 LAB — SALICYLATE LEVEL: Salicylate Lvl: <7 mg/dL — ABNORMAL LOW (ref 7.0–30.0)

## 2020-10-06 MED ORDER — TIAGABINE HCL 4 MG PO TABS
4.0000 mg | ORAL_TABLET | Freq: Every day | ORAL | Status: DC
  Administered 2020-10-07: 4 mg via ORAL
  Filled 2020-10-06: qty 1

## 2020-10-06 MED ORDER — DIVALPROEX SODIUM ER 500 MG PO TB24
1000.0000 mg | ORAL_TABLET | Freq: Every day | ORAL | Status: DC
  Administered 2020-10-07: 1000 mg via ORAL
  Filled 2020-10-06: qty 2

## 2020-10-06 MED ORDER — OLANZAPINE 10 MG PO TBDP
20.0000 mg | ORAL_TABLET | Freq: Every day | ORAL | Status: DC
  Administered 2020-10-07: 20 mg via ORAL
  Filled 2020-10-06: qty 2

## 2020-10-06 MED ORDER — ACETAMINOPHEN 325 MG PO TABS: 650.0000 mg | ORAL_TABLET | ORAL | Status: DC | PRN

## 2020-10-06 MED ORDER — ZIPRASIDONE MESYLATE 20 MG IM SOLR
20.0000 mg | Freq: Once | INTRAMUSCULAR | Status: AC
  Administered 2020-10-06: 20 mg via INTRAMUSCULAR
  Filled 2020-10-06: qty 20

## 2020-10-06 MED ORDER — DOXEPIN HCL 50 MG PO CAPS
50.0000 mg | ORAL_CAPSULE | Freq: Every day | ORAL | Status: DC
  Administered 2020-10-07: 50 mg via ORAL
  Filled 2020-10-06: qty 1

## 2020-10-06 MED ORDER — NICOTINE 21 MG/24HR TD PT24
21.0000 mg | MEDICATED_PATCH | Freq: Every day | TRANSDERMAL | Status: DC
  Administered 2020-10-07: 21 mg via TRANSDERMAL
  Filled 2020-10-06: qty 1

## 2020-10-06 MED ORDER — STERILE WATER FOR INJECTION IJ SOLN
INTRAMUSCULAR | Status: AC
  Filled 2020-10-06: qty 10

## 2020-10-06 NOTE — ED Triage Notes (Signed)
Pt voluntary c/o hallucinations and SI. Pt states there are people trying to hurt him, but he is not sure who the people are. Pt has plans to hang himself with his belt. Pt tearful in triage.

## 2020-10-06 NOTE — ED Provider Notes (Signed)
[facility]-EMERGENCY DEPT Provider Note   CSN: [id number] Arrival date & time: 10/06/20  1950     History Chief Complaint  Patient presents with   Suicidal   Hallucinations    Brett House is a 35 y.o. male with a history of anxiety, depression, bipolar disorder, and substance abuse who presents to the emergency department with complaints of suicidal ideation and hallucinations which have been going on for a while.  Patient states that he is hearing voices, people are following him, he has had thoughts of suicide but is here to try to avoid attempting to harm himself. Has had thoughts of hanging himself.  He used meth yesterday, no drug use today.  He drinks alcohol some days but not every day.  He denies fever, chills, chest pain, shortness of breath, or abdominal pain.  HPI     Past Medical History:  Diagnosis Date   Anxiety    Asthma    Bipolar disorder [facility])    Depression     Patient Active Problem List   Diagnosis Date Noted   Bipolar disorder (HCC) 09/25/2020   Brief psychotic disorder (HCC)    Suicidal ideation    Cocaine use disorder, mild, abuse (HCC) 01/27/2020   Marijuana abuse 01/27/2020   Bipolar I disorder, current episode depressed (HCC) 01/25/2020    History reviewed. No pertinent surgical history.     History reviewed. No pertinent family history.  Social History   Tobacco Use   Smoking status: Every Day    Packs/day: 1.50    Years: 15.00    Pack years: 22.50    Types: Cigarettes   Smokeless tobacco: Current  Vaping Use   Vaping Use: Never used  Substance Use Topics   Alcohol use: Yes    Comment: 6 pack once a week for last 8-9 months   Drug use: Yes    Types: Marijuana    Comment: rarely    Home Medications Prior to Admission medications   Medication Sig Start Date End Date Taking? Authorizing Provider  divalproex (DEPAKOTE ER) 500 MG 24 hr tablet Take 2 tablets (1,000 mg total) by mouth at bedtime. 10/01/20    Laveda Abbe, NP  doxepin (SINEQUAN) 50 MG capsule Take 1 capsule (50 mg total) by mouth at bedtime. 10/01/20   Laveda Abbe, NP  nicotine polacrilex (NICORETTE) 2 MG gum Take 1 each (2 mg total) by mouth as needed for smoking cessation. You are encouraged to continue smoking cessation. 10/01/20   Laveda Abbe, NP  OLANZapine zydis (ZYPREXA) 20 MG disintegrating tablet Take 1 tablet (20 mg total) by mouth at bedtime. 10/01/20   Laveda Abbe, NP  tiaGABine (GABITRIL) 4 MG tablet Take 1 tablet (4 mg total) by mouth at bedtime. 10/01/20   Laveda Abbe, NP    Allergies    Erythromycin  Review of Systems   Review of Systems  Constitutional:  Negative for chills and fever.  Respiratory:  Negative for shortness of breath.   Cardiovascular:  Negative for chest pain.  Gastrointestinal:  Negative for abdominal pain, diarrhea and vomiting.  Psychiatric/Behavioral:  Positive for hallucinations and suicidal ideas.   All other systems reviewed and are negative.  Physical Exam Updated Vital Signs BP (!) 136/91 (BP Location: Right Arm)   Pulse 84   Temp 98.1 F (36.7 C) (Oral)   Resp 18   Ht 5\' 10"  (1.778 m)   Wt 72.6 kg   SpO2 94%  BMI 22.96 kg/m   Physical Exam Vitals and nursing note reviewed.  Constitutional:      General: He is not in acute distress.    Appearance: He is well-developed. He is not toxic-appearing.  HENT:     Head: Normocephalic and atraumatic.  Eyes:     General:        Right eye: No discharge.        Left eye: No discharge.     Conjunctiva/sclera: Conjunctivae normal.  Cardiovascular:     Rate and Rhythm: Normal rate and regular rhythm.  Pulmonary:     Effort: Pulmonary effort is normal. No respiratory distress.     Breath sounds: Normal breath sounds. No wheezing, rhonchi or rales.  Abdominal:     General: There is no distension.     Palpations: Abdomen is soft.     Tenderness: There is no abdominal tenderness.   Musculoskeletal:     Cervical back: Neck supple.  Skin:    General: Skin is warm and dry.     Findings: No rash.  Neurological:     Mental Status: He is alert.     Comments: Clear speech.   Psychiatric:        Speech: Speech is rapid and pressured.        Behavior: Behavior is agitated.        Thought Content: Thought content includes suicidal ideation. Thought content includes suicidal plan.     Comments: Appears to be reacting to internal stimuli at times.     ED Results / Procedures / Treatments   Labs (all labs ordered are listed, but only abnormal results are displayed) Labs Reviewed - No data to display  EKG None  Radiology No results found.  Procedures Procedures   Medications Ordered in ED Medications - No data to display  ED Course  I have reviewed the triage vital signs and the nursing notes.  Pertinent labs & imaging results that were available during my care of the patient were reviewed by me and considered in my medical decision making (see chart for details).    MDM Rules/Calculators/A&P                           Patient presents to the ED for SI & hallucinations.  Nontoxic, vitals w/ mildly elevated BP- doubt HTN emergency.  Rapid pressured speech with agitation and at times aggression. Geodon ordered.   Additional history obtained:  Additional history obtained from chart review & nursing note review.   Lab Tests:  Screening labs have been reviewed including CBC, CMP, acetaminophen/salicylate/ethanol level: Mild hypokalemia, otherwise fairly unremarkable.   ED Course:  Patient is medically cleared. Consult placed to TTS. Disposition per [facility].   The patient has been placed in psychiatric observation due to the need to provide a safe environment for the patient while obtaining psychiatric consultation and evaluation, as well as ongoing medical and medication management to treat the patient's condition.  The patient has not been placed under full  IVC at this time.  Portions of this note were generated with Scientist, clinical (histocompatibility and immunogenetics). Dictation errors may occur despite best attempts at proofreading.  Final Clinical Impression(s) / ED Diagnoses Final diagnoses:  Suicidal ideation  Hallucinations    Rx / DC Orders ED Discharge Orders     None        Cherly Anderson, PA-C 10/06/20 2349    Arby Barrette, MD 11/04/20 1616

## 2020-10-07 ENCOUNTER — Ambulatory Visit (HOSPITAL_COMMUNITY)
Admission: EM | Admit: 2020-10-07 | Discharge: 2020-10-07 | Disposition: A | Discharge status: Home / Self Care | Payer: No Payment, Other

## 2020-10-07 DIAGNOSIS — F1994 Other psychoactive substance use, unspecified with psychoactive substance-induced mood disorder: Secondary | ICD-10-CM

## 2020-10-07 DIAGNOSIS — F1510 Other stimulant abuse, uncomplicated: Secondary | ICD-10-CM | POA: Diagnosis present

## 2020-10-07 DIAGNOSIS — F22 Delusional disorders: Secondary | ICD-10-CM

## 2020-10-07 LAB — RESP PANEL BY RT-PCR (FLU A&B, COVID) ARPGX2
Influenza A by PCR: NEGATIVE
Influenza B by PCR: NEGATIVE
SARS Coronavirus 2 by RT PCR: NEGATIVE

## 2020-10-07 LAB — RAPID URINE DRUG SCREEN, HOSP PERFORMED
Amphetamines: POSITIVE — AB
Barbiturates: NOT DETECTED
Benzodiazepines: NOT DETECTED
Cocaine: NOT DETECTED
Opiates: NOT DETECTED
Tetrahydrocannabinol: NOT DETECTED

## 2020-10-07 NOTE — ED Provider Notes (Signed)
Per behavioral health patient appropriate for discharge with outpatient follow-up.   Gerhard Munch, MD 10/07/20 1123

## 2020-10-07 NOTE — ED Notes (Signed)
Pt's belongings were placed in one white belongings' bag kept in the cabinet labeled, "patient belongings 16-18 Resus A."

## 2020-10-07 NOTE — Consult Note (Signed)
[facility]   Reason for Consult:  psych consult Referring Physician:  Harvie Hecksamantha petrucelli PA-C Patient Identification: Brett House MRN:  [id number] Principal Diagnosis: Substance induced mood disorder (HCC) Diagnosis:  Principal Problem:   Substance induced mood disorder (HCC) Active Problems:   Amphetamine abuse (HCC)   Total Time spent with patient: 20 minutes  Subjective:   Brett House is a 35 y.o. male patient admitted with suicidal ideation and hallucinations; reported recent meth use.  On assessment patient initially asleep and in no acute distress. Patient states he "feels good"; says he was bought in for "suicidal thoughts". Patient answering questions appropriately; does not appear to be acutely psychotic or under the influence of any substance. [facility]; when asked about his follow-up to resources patient reports no follow-up and states he doesn't have his medications because "it got stolen and they wouldn't refill them". Patient reports he is currently homeless. Provider [facility] including on[facility].   Patient reports chronic suicidal ideations and substance use; most recent drug of choice methamphetamine with last use "last night". UDS+amphetamine. He denies any homicidal ideations, auditory or visual hallucination, and does not appear to be responding to any external/internal stimuli. Provider [facility] and local substance abuse treatment facilities; patient declined at this time, provider explained resources would be provided in AVS for individual follow up.   HPI:   Brett House is a 35 year old male with a history of anxiety, depression, bipolar disorder, and substance abuse who [facility] with complaints of suicidal ideations. Per chart review patient [facility] presentation where [facility]; released 10/01/20 to local  shelter. UDS+amphetamine; BAL<10. PDMP reviewed, no prescriptions noted in system.   Past Psychiatric History:   -Substance induced mood disorder  -polysubstance abuse  -bipolar disorder  Risk to Self:   Risk to Others:   Prior Inpatient Therapy:   Prior Outpatient Therapy:    Past Medical History:  Past Medical History:  Diagnosis Date   Anxiety    Asthma    Bipolar disorder (HCC)    Depression    History reviewed. No pertinent surgical history. Family History: History reviewed. No pertinent family history. Family Psychiatric  History: not noted Social History:  Social History   Substance and Sexual Activity  Alcohol Use Yes   Comment: 6 pack once a week for last 8-9 months     Social History   Substance and Sexual Activity  Drug Use Yes   Types: Marijuana   Comment: rarely    Social History   Socioeconomic History   Marital status: Single    Spouse name: Not on file   Number of children: 0   Years of education: Not on file   Highest education level: 12th grade  Occupational History   Occupation: Unemployed  Tobacco Use   Smoking status: Every Day    Packs/day: 1.50    Years: 15.00    Pack years: 22.50    Types: Cigarettes   Smokeless tobacco: Current  Vaping Use   Vaping Use: Never used  Substance and Sexual Activity   Alcohol use: Yes    Comment: 6 pack once a week for last 8-9 months   Drug use: Yes    Types: Marijuana    Comment: rarely   Sexual activity: Yes  Other Topics Concern   Not on file  Social History Narrative   Not on file  Social Determinants of Health   Financial Resource Strain: Not on file  Food Insecurity: Not on file  Transportation Needs: Not on file  Physical Activity: Not on file  Stress: Not on file  Social Connections: Not on file   Additional Social History:    Allergies:   Allergies  Allergen Reactions   Erythromycin Other (See Comments)    Unknown childhood allergic reaction    Labs:  Results for  orders placed or performed during the hospital encounter of 10/06/20 (from the past 48 hour(s))  Resp Panel by RT-PCR (Flu A&B, Covid) Nasopharyngeal Swab     Status: None   Collection Time: 10/06/20  9:34 AM   Specimen: Nasopharyngeal Swab; Nasopharyngeal(NP) swabs in vial transport medium  Result Value Ref Range   SARS Coronavirus 2 by RT PCR NEGATIVE NEGATIVE    Comment: (NOTE) SARS-CoV-2 target nucleic acids are NOT DETECTED.  The SARS-CoV-2 RNA is generally detectable in upper respiratory specimens during the acute phase of infection. The lowest concentration of SARS-CoV-2 viral copies this assay can detect is 138 copies/mL. A negative result does not preclude SARS-Cov-2 infection and should not be used as the sole basis for treatment or other patient management decisions. A negative result may occur with  improper specimen collection/handling, submission of specimen other than nasopharyngeal swab, presence of viral mutation(s) within the areas targeted by this assay, and inadequate number of viral copies(<138 copies/mL). A negative result must be combined with clinical observations, patient history, and epidemiological information. The expected result is Negative.  Fact Sheet for Patients:  BloggerCourse.com  Fact Sheet for Healthcare Providers:  SeriousBroker.it  This test is no t yet approved or cleared by the [place] FDA and  has been authorized for detection and/or diagnosis of SARS-CoV-2 by FDA under an Emergency Use Authorization (EUA). This EUA will remain  in effect (meaning this test can be used) for the duration of the COVID-19 declaration under Section 564(b)(1) of the Act, 21 U.S.C.section 360bbb-3(b)(1), unless the authorization is terminated  or revoked sooner.       Influenza A by PCR NEGATIVE NEGATIVE   Influenza B by PCR NEGATIVE NEGATIVE    Comment: (NOTE) The Xpert Xpress SARS-CoV-2/FLU/RSV plus  assay is intended as an aid in the diagnosis of influenza from Nasopharyngeal swab specimens and should not be used as a sole basis for treatment. Nasal washings and aspirates are unacceptable for Xpert Xpress SARS-CoV-2/FLU/RSV testing.  Fact Sheet for Patients: BloggerCourse.com  Fact Sheet for Healthcare Providers: SeriousBroker.it  This test is not yet approved or cleared by the [place] FDA and has been authorized for detection and/or diagnosis of SARS-CoV-2 by FDA under an Emergency Use Authorization (EUA). This EUA will remain in effect (meaning this test can be used) for the duration of the COVID-19 declaration under Section 564(b)(1) of the Act, 21 U.S.C. section 360bbb-3(b)(1), unless the authorization is terminated or revoked.  Performed at [facility], [address] [place], [place] [address]   CBC     Status: None   Collection Time: 10/06/20 10:11 PM  Result Value Ref Range   WBC 7.1 4.0 - 10.5 K/uL   RBC 4.45 4.22 - 5.81 MIL/uL   Hemoglobin 13.7 13.0 - 17.0 g/dL   HCT 70.6 23.7 - 62.8 %   MCV 90.6 80.0 - 100.0 fL   MCH 30.8 26.0 - 34.0 pg   MCHC 34.0 30.0 - 36.0 g/dL   RDW 31.5 17.6 - 16.0 %  Platelets 286 150 - 400 K/uL   nRBC 0.0 0.0 - 0.2 %    Comment: Performed at [facility], [address], [place], [place] [address]  Salicylate level     Status: Abnormal   Collection Time: 10/06/20 10:11 PM  Result Value Ref Range   Salicylate Lvl <7.0 (L) 7.0 - 30.0 mg/dL    Comment: Performed at [facility], [address], [place], [place] [address]  Acetaminophen level     Status: Abnormal   Collection Time: 10/06/20 10:11 PM  Result Value Ref Range   Acetaminophen (Tylenol), Serum <10 (L) 10 - 30 ug/mL    Comment: (NOTE) Therapeutic concentrations vary significantly. A range of 10-30 ug/mL  may be an effective concentration for many patients.  However, some  are best treated at concentrations outside of this range. Acetaminophen concentrations >150 ug/mL at 4 hours after ingestion  and >50 ug/mL at 12 hours after ingestion are often associated with  toxic reactions.  Performed at [facility], [address], [place], [place] [address]   Comprehensive metabolic panel     Status: Abnormal   Collection Time: 10/06/20 10:11 PM  Result Value Ref Range   Sodium 137 135 - 145 mmol/L   Potassium 3.1 (L) 3.5 - 5.1 mmol/L   Chloride 100 98 - 111 mmol/L   CO2 25 22 - 32 mmol/L   Glucose, Bld 104 (H) 70 - 99 mg/dL    Comment: Glucose reference range applies only to samples taken after fasting for at least 8 hours.   BUN 12 6 - 20 mg/dL   Creatinine, Ser 5.64 0.61 - 1.24 mg/dL   Calcium 8.9 8.9 - 33.2 mg/dL   Total Protein 7.5 6.5 - 8.1 g/dL   Albumin 4.9 3.5 - 5.0 g/dL   AST 26 15 - 41 U/L   ALT 31 0 - 44 U/L   Alkaline Phosphatase 60 38 - 126 U/L   Total Bilirubin 1.3 (H) 0.3 - 1.2 mg/dL   GFR, Estimated >95 >18 mL/min    Comment: (NOTE) Calculated using the CKD-EPI Creatinine Equation (2021)    Anion gap 12 5 - 15    Comment: Performed at [facility], [address], [place], [place] [address]  Ethanol     Status: None   Collection Time: 10/06/20 10:11 PM  Result Value Ref Range   Alcohol, Ethyl (B) <10 <10 mg/dL    Comment: (NOTE) Lowest detectable limit for serum alcohol is 10 mg/dL.  For medical purposes only. Performed at [facility], [address] [place], [place] [address]   Rapid urine drug screen (hospital performed)     Status: Abnormal   Collection Time: 10/07/20  9:39 AM  Result Value Ref Range   Opiates NONE DETECTED NONE DETECTED   Cocaine NONE DETECTED NONE DETECTED   Benzodiazepines NONE DETECTED NONE DETECTED   Amphetamines POSITIVE (A) NONE DETECTED   Tetrahydrocannabinol NONE DETECTED NONE DETECTED   Barbiturates NONE DETECTED NONE  DETECTED    Comment: (NOTE) DRUG SCREEN FOR MEDICAL PURPOSES ONLY.  IF CONFIRMATION IS NEEDED FOR ANY PURPOSE, NOTIFY LAB WITHIN 5 DAYS.  LOWEST DETECTABLE LIMITS FOR URINE DRUG SCREEN Drug Class                     Cutoff (ng/mL) Amphetamine and metabolites    1000 Barbiturate and metabolites    200 Benzodiazepine  200 Tricyclics and metabolites     300 Opiates and metabolites        300 Cocaine and metabolites        300 THC                            50 Performed at [facility], [address] [place], [place] [address]     Current Facility-Administered Medications  Medication Dose Route Frequency Provider Last Rate Last Admin   acetaminophen (TYLENOL) tablet 650 mg  650 mg Oral Q4H PRN Petrucelli, Samantha R, PA-C       divalproex (DEPAKOTE ER) 24 hr tablet 1,000 mg  1,000 mg Oral QHS Petrucelli, Samantha R, PA-C   1,000 mg at 10/07/20 0510   doxepin (SINEQUAN) capsule 50 mg  50 mg Oral QHS Petrucelli, Samantha R, PA-C   50 mg at 10/07/20 0510   nicotine (NICODERM CQ - dosed in mg/24 hours) patch 21 mg  21 mg Transdermal Daily Petrucelli, Samantha R, PA-C   21 mg at 10/07/20 1015   OLANZapine zydis (ZYPREXA) disintegrating tablet 20 mg  20 mg Oral QHS Petrucelli, Samantha R, PA-C   20 mg at 10/07/20 0510   tiaGABine (GABITRIL) tablet 4 mg  4 mg Oral QHS Petrucelli, Samantha R, PA-C   4 mg at 10/07/20 0510   Current Outpatient Medications  Medication Sig Dispense Refill   divalproex (DEPAKOTE ER) 500 MG 24 hr tablet Take 2 tablets (1,000 mg total) by mouth at bedtime. 60 tablet 0   doxepin (SINEQUAN) 50 MG capsule Take 1 capsule (50 mg total) by mouth at bedtime. 30 capsule 0   nicotine polacrilex (NICORETTE) 2 MG gum Take 1 each (2 mg total) by mouth as needed for smoking cessation. You are encouraged to continue smoking cessation. 100 tablet 0   OLANZapine zydis (ZYPREXA) 20 MG disintegrating tablet Take 1 tablet (20 mg total) by mouth at  bedtime. 30 tablet 0   tiaGABine (GABITRIL) 4 MG tablet Take 1 tablet (4 mg total) by mouth at bedtime. 30 tablet 0    Musculoskeletal: Strength & Muscle Tone: within normal limits Gait & Station: normal Patient leans: N/A  Psychiatric Specialty Exam:  Presentation  General Appearance: Casual  Eye Contact:Fleeting; Other (comment) (patient laying in bed with eyes closed)  Speech:Normal Rate  Speech Volume:Normal  Handedness:Right   Mood and Affect  Mood:Euthymic  Affect:Appropriate   Thought Process  Thought Processes:Goal Directed  Descriptions of Associations:Intact  Orientation:Full (Time, Place and Person)  Thought Content:WDL  History of Schizophrenia/Schizoaffective disorder:No  Duration of Psychotic Symptoms:N/A  Hallucinations:Hallucinations: None Ideas of Reference:None  Suicidal Thoughts:Suicidal Thoughts: No (reports history of chronic passive suicidal ideations) Homicidal Thoughts:Homicidal Thoughts: No  Sensorium  Memory:Immediate Fair; Recent Fair; Remote Fair  Judgment:Fair  Insight:Fair   Executive Functions  Concentration:Fair  Attention Span:Fair  Recall:Fair  Fund of Knowledge:Fair  Language:Fair   Psychomotor Activity  Psychomotor Activity: Psychomotor Activity: Normal  Assets  Assets:Physical Health; Resilience; Social Support   Sleep  Sleep: Sleep: Good  Physical Exam: Physical Exam Vitals and nursing note reviewed.  Constitutional:      Appearance: He is normal weight. He is not diaphoretic.  HENT:     Head: Normocephalic.     Nose: Nose normal.     Mouth/Throat:     Mouth: Mucous membranes are moist.     Pharynx: Oropharynx is clear.  Eyes:     Pupils: Pupils  are equal, round, and reactive to light.  Cardiovascular:     Rate and Rhythm: Normal rate.     Pulses: Normal pulses.  Pulmonary:     Effort: Pulmonary effort is normal.  Abdominal:     General: Abdomen is flat.  Musculoskeletal:         General: Normal range of motion.     Cervical back: Normal range of motion.  Skin:    General: Skin is warm and dry.  Neurological:     General: No focal deficit present.     Mental Status: He is alert and oriented to person, place, and time. Mental status is at baseline.  Psychiatric:        Attention and Perception: Attention and perception normal.        Mood and Affect: Mood and affect normal.        Speech: Speech normal.        Behavior: Behavior normal. Behavior is cooperative.        Thought Content: Thought content normal. Thought content is not paranoid or delusional. Thought content does not include homicidal or suicidal ideation. Thought content does not include homicidal or suicidal plan.        Cognition and Memory: Cognition and memory normal.        Judgment: Judgment normal.   Review of Systems  Psychiatric/Behavioral:  Positive for substance abuse. Negative for hallucinations.   All other systems reviewed and are negative. Blood pressure 128/72, pulse 67, temperature 97.8 F (36.6 C), resp. rate 16, height 5\' 10"  (1.778 m), weight 72.6 kg, SpO2 100 %. Body mass index is 22.96 kg/m.  Treatment Plan Summary: Plan Discharge patient home with outpatient resources.   Disposition: No evidence of imminent risk to self or others at present.   Patient does not meet criteria for psychiatric inpatient admission. Supportive therapy provided about ongoing stressors. Discussed crisis plan, support from social network, calling 911, coming to the Emergency Department, and calling Suicide Hotline.  , NP 10/07/2020 11:22 AM

## 2020-10-07 NOTE — ED Notes (Signed)
Pt asleep, equal chest rise noted. Easily visualized from RN station.

## 2020-10-07 NOTE — Discharge Instructions (Signed)
Take all medications as prescribed. Keep all follow-up appointments as scheduled.  Do not consume alcohol or use illegal drugs while on prescription medications. Report any adverse effects from your medications to your primary care provider promptly.  In the event of recurrent symptoms or worsening symptoms, call 911, a crisis hotline, or go to the nearest emergency department for evaluation.   

## 2020-10-07 NOTE — ED Notes (Signed)
Pt provided breakfast tray.  Pt cooperative at this time.

## 2020-10-07 NOTE — Discharge Instructions (Addendum)
Please use the provided resources for close outpatient follow-up.  Return here for concerning changes in your condition.  Substance Abuse Resources   [facility] - Admissions are currently completed Monday through Friday at 8am; both appointments and walk-ins are accepted.  Any individual that is a [facility] resident may present for a substance abuse screening and assessment for admission.  A person may be referred by numerous sources or self-refer.   Potential clients will be screened for medical necessity and appropriateness for the program.  Clients must meet criteria for high-intensity residential treatment services.  If clinically appropriate, a client will continue with the comprehensive clinical assessment and intake process, as well as enrollment in the [facility].  Address: [address] [place], [place] [address] Admin Hours: Mon-Fri 8AM to [facility] Hours: 24/7 Phone: [phone] Fax: [phone]  [facility] (Det[facility]) Facility Based Crisis:  These are 3 locations for services: Please call before arrival:    [facility] Recovery Facility Based Crisis [facility])  Address: [address] [place], [place] [address] Phone: [phone]  [facility] Recovery Facility Based Crisis [facility]) Address: [address] [place], [place] [address] Phone#: [phone]  [facility] Recovery Facility Based Crisis [facility]) Address: [address] [place], [place] [address] Phone#: [phone]   [facility] ([facility]): (offers outpatient therapy and intensive outpatient substance abuse therapy).  [address], [place], [place] [address] Phone: [phone]  [facility] Address: [address] [place], [place] [address] Phone: [phone]  -The [facility] provides food, shelter and other programs and services to the homeless men of [place] through our [employer] program.  By offering safe  shelter, three meals a day, clean clothing, Biblical counseling, financial planning, vocational training, GED/education and employment assistance, we've helped mend the shattered lives of many homeless men since opening in [place].  We have approximately 267 beds available, with a max of 312 beds including mats for emergency situations and currently house an average of 270 men a night.  Prospective Client Check-In Information Photo ID Required (State/ Out of State/ [facility]) - if photo ID is not available, clients are required to have a printout of a police/sheriff's criminal history report. Help out with chores around the [place]. No sex offender of any type (pending, charged, registered and/or any other sex related offenses) will be permitted to check in. Must be willing to abide by all rules, regulations, and policies established by the [employer]. The following will be provided - shelter, food, clothing, and biblical counseling. If you or someone you know is in need of assistance at our [facility] shelter in [place], [place], please call [phone] ext. [phone].  [facility] for [employer] hours are Monday-Friday only.   [facility]:   Phone: [phone]  The [facility] [employer] [facility] Intensive Outpatient Program [facility]) means structured individual and group addiction activities and services that are provided at an outpatient program designed to assist adult and adolescent consumers to begin recovery and learn skills for recovery maintenance. The [employer] SAIOP program is offered at least 3 hours a day, 3 days a week. SAIOP services shall include a structured program consisting of, but not limited to, the following services: Individual counseling and support; Group counseling and support; Family counseling, training or support; Biochemical assays to identify recent drug use (e.g., urine drug screens); Strategies for relapse prevention to include  community and social support  systems in treatment; Life skills; Crisis contingency planning; Disease Management; and Treatment support activities that have been adapted or specifically designed for persons with physical disabilities, or persons with co-occurring disorders of mental illness and substance abuse/dependence or mental retardation/developmental disability and substance abuse/dependence.  Phone: [phone]   [facility])  Address: [address] [place], [place], [place] [address] Phone: [phone]   [facility] Address: [address], [place], [place] [address] Phone: [phone]  - a combination of group and individual sessions to meet the participants needs. This allows participants to engage in treatment and remain involved in their home and work life. - Transitional housing places program participants in a supportive living environment while they complete a treatment program and work to secure independent housing. - The Substance Abuse Intensive Outpatient Treatment Program at [employer] consists of structured group sessions and individual sessions that are designed to teach participants early recovery and relapse prevention skills. -[facility] works with the [employer] to provide a housing and treatment program for homeless veterans.    The [facility] will also offer the following outpatient services: (Monday through Friday 8am-5pm)   Partial Hospitalization Program (PHP) Substance Abuse Intensive Outpatient Program (SA-IOP) Group Therapy Medication Management Peer Living Room We also provide (24/7):  Assessments: Our mental health clinician and providers will conduct a focused mental health evaluation, assessing for immediate safety concerns and further mental health needs. Referral: Our team will provide resources and help connect to community based mental health treatment, when indicated,  including psychotherapy, psychiatry, and other specialized behavioral health or substance use disorder services (for those not already in treatment). Transitional Care: Our team providers in person bridging and/or telephonic follow-up during the patient's transition to outpatient services.   The [facility] 24-Hour Call Center: [phone] Behavioral Health Crisis Line: [phone]

## 2020-10-07 NOTE — ED Provider Notes (Signed)
Behavioral Health Urgent Care Medical Screening Exam  Patient Name: Brett House MRN: [id number] Date of Evaluation: 10/07/20 Chief Complaint:   Diagnosis:  Final diagnoses:  Paranoia (HCC)  Substance induced mood disorder (HCC)    History of Present illness: Brett House is a 35 y.o. male presents to [facility] urgent care accompanied by EMS.  Patient reports feeling suicidal due to people following him.  Chart review patient was recently discharged with outpatient resources for follow up with [facility].   Patient reports chronic paranoia for the past year.  States symptoms have been increasingly worse over the past 2 days.  Patient was recently discharged from inpatient admission reports his medication was" stolen by people were following him."  Patient was offered medications to be refilled however he declined.  We will make bus pass available patient to keep all outpatient follow-up appointments.  Brett House presented pressured and tangential. Denied recent drug use. Patient has a charted history with depression, bipolar disorder and substance use. UDS+ for amphetamines at 09:39.   Psychiatric Specialty Exam  Presentation  General Appearance:Casual  Eye Contact:Fleeting; Other (comment) (patient laying in bed with eyes closed)  Speech:Normal Rate  Speech Volume:Normal  Handedness:Right   Mood and Affect  Mood:Euthymic  Affect:Appropriate   Thought Process  Thought Processes:Goal Directed  Descriptions of Associations:Intact  Orientation:Full (Time, Place and Person)  Thought Content:WDL  Diagnosis of Schizophrenia or Schizoaffective disorder in past: No   Hallucinations:None Reporting he is hearing whispers but no elaboration on what voices were saying  Ideas of Reference:None  Suicidal Thoughts:No (reports history of chronic passive suicidal ideations) Without Plan  Homicidal Thoughts:No   Sensorium  Memory:Immediate Fair; Recent Fair; Remote  Fair  Judgment:Fair  Insight:Fair   Executive Functions  Concentration:Fair  Attention Span:Fair  Recall:Fair  Fund of Knowledge:Fair  Language:Fair   Psychomotor Activity  Psychomotor Activity:Normal   Assets  Assets:Physical Health; Resilience; Social Support   Sleep  Sleep:Good  Number of hours: 8.5   No data recorded  Physical Exam: Physical Exam Vitals and nursing note reviewed.  Cardiovascular:     Rate and Rhythm: Normal rate and regular rhythm.     Pulses: Normal pulses.  Skin:    General: Skin is warm and dry.  Neurological:     Mental Status: He is oriented to person, place, and time.  Psychiatric:        Attention and Perception: Attention normal.        Mood and Affect: Mood normal.        Speech: Speech is rapid and pressured.        Behavior: Behavior normal.        Thought Content: Thought content is paranoid.        Cognition and Memory: Cognition normal.        Judgment: Judgment normal.   Review of Systems  HENT: Negative.    Eyes: Negative.   Cardiovascular: Negative.   Genitourinary: Negative.   Musculoskeletal: Negative.   Skin: Negative.   Neurological: Negative.   Endo/Heme/Allergies: Negative.   Psychiatric/Behavioral:  Positive for depression, hallucinations, substance abuse and suicidal ideas. The patient is nervous/anxious.   All other systems reviewed and are negative. Blood pressure 125/78, pulse (!) 101, temperature 97.9 F (36.6 C), temperature source Oral, resp. rate 18. There is no height or weight on file to calculate BMI.  Musculoskeletal: Strength & Muscle Tone: within normal limits Gait & Station: normal Patient leans: N/A   [facility] MSE Discharge Disposition for Follow  up and Recommendations: Based on my evaluation the patient does not appear to have an emergency medical condition and can be discharged with resources and follow up care in outpatient services for Substance Abuse Intensive Outpatient  Program   Patient was provided with bus passes and follow-up with [facility] services.    Oneta Rack, NP 10/07/2020, 2:54 PM

## 2020-10-07 NOTE — Progress Notes (Signed)
CSW provided the following resources for the patient to utilize upon discharge listed below:  Substance Abuse Resources   [facility] - Admissions are currently completed Monday through Friday at 8am; both appointments and walk-ins are accepted.  Any individual that is a [facility] resident may present for a substance abuse screening and assessment for admission.  A person may be referred by numerous sources or self-refer.   Potential clients will be screened for medical necessity and appropriateness for the program.  Clients must meet criteria for high-intensity residential treatment services.  If clinically appropriate, a client will continue with the comprehensive clinical assessment and intake process, as well as enrollment in the [facility].  Address: [address] [place], [place] [address] Admin Hours: Mon-Fri 8AM to [facility] Hours: 24/7 Phone: [phone] Fax: [phone]  [facility] (Det[facility]) Facility Based Crisis:  These are 3 locations for services: Please call before arrival:    [facility] Recovery Facility Based Crisis [facility])  Address: [address] [place], [place] [address] Phone: [phone]  [facility] Recovery Facility Based Crisis [facility]) Address: [address] [place], [place] [address] Phone#: [phone]  [facility] Recovery Facility Based Crisis [facility]) Address: [address] [place], [place] [address] Phone#: [phone]   [facility] ([facility]): (offers outpatient therapy and intensive outpatient substance abuse therapy).  [address] [place], [place] [address] Phone: [phone]  [facility] Address: [address] [place], [place] [address] Phone: [phone]  -[facility] provides food, shelter and other programs and services to the homeless men of [place] through our [employer] program.  By offering safe shelter, three meals a day,  clean clothing, Biblical counseling, financial planning, vocational training, GED/education and employment assistance, we've helped mend the shattered lives of many homeless men since opening in [place].  We have approximately 267 beds available, with a max of 312 beds including mats for emergency situations and currently house an average of 270 men a night.  Prospective Client Check-In Information Photo ID Required (State/ Out of State/ [facility]) - if photo ID is not available, clients are required to have a printout of a police/sheriff's criminal history report. Help out with chores around the [facility]. No sex offender of any type (pending, charged, registered and/or any other sex related offenses) will be permitted to check in. Must be willing to abide by all rules, regulations, and policies established by the [employer]. The following will be provided - shelter, food, clothing, and biblical counseling. If you or someone you know is in need of assistance at our [facility] shelter in [place], [place], please call [phone]. [phone].  [facility] for [employer] hours are Monday-Friday only.   [facility]:   Phone: [phone]  The [facility] Intensive Outpatient Program [facility]) means structured individual and group addiction activities and services that are provided at an outpatient program designed to assist adult and adolescent consumers to begin recovery and learn skills for recovery maintenance. The [employer] SAIOP program is offered at least 3 hours a day, 3 days a week. SAIOP services shall include a structured program consisting of, but not limited to, the following services: Individual counseling and support; Group counseling and support; Family counseling, training or support; Biochemical assays to identify recent drug use (e.g., urine drug screens); Strategies for relapse prevention to include community and social support  systems in treatment; Life  skills; Crisis contingency planning; Disease Management; and Treatment support activities that have been adapted or specifically designed for persons with physical disabilities, or persons with co-occurring disorders of mental illness and substance abuse/dependence or mental retardation/developmental disability and substance abuse/dependence.  Phone: [phone]   [facility])  Address: [address] [place], [place], [place] [address] Phone: [phone]   [facility] Address: [address] [place], [place] [address] Phone: [phone]  - a combination of group and individual sessions to meet the participants needs. This allows participants to engage in treatment and remain involved in their home and work life. - Transitional housing places program participants in a supportive living environment while they complete a treatment program and work to secure independent housing. - The Substance Abuse Intensive Outpatient Treatment Program at [employer] consists of structured group sessions and individual sessions that are designed to teach participants early recovery and relapse prevention skills. -[facility] works with the [employer] to provide a housing and treatment program for homeless veterans.    The [facility] will also offer the following outpatient services: (Monday through Friday 8am-5pm)   Partial Hospitalization Program (PHP) Substance Abuse Intensive Outpatient Program (SA-IOP) Group Therapy Medication Management Peer Living Room We also provide (24/7):  Assessments: Our mental health clinician and providers will conduct a focused mental health evaluation, assessing for immediate safety concerns and further mental health needs. Referral: Our team will provide resources and help connect to community based mental health treatment, when indicated, including psychotherapy, psychiatry,  and other specialized behavioral health or substance use disorder services (for those not already in treatment). Transitional Care: Our team providers in person bridging and/or telephonic follow-up during the patient's transition to outpatient services.   The [facility] 24-Hour Call Center: [phone] Behavioral Health Crisis Line: [phone]   Crissie Reese, MSW, LCSW-A, [place] Phone: [phone] Disposition/TOC

## 2020-10-07 NOTE — ED Notes (Signed)
D/c paperwork reviewed with pt, including f/u resources.  No further questions or concerns voiced by pt. Pt ambulatory to bathroom to change into personal clothes. 1 pt belongings bag returned to pt.
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
## 2020-10-11 ENCOUNTER — Encounter (HOSPITAL_COMMUNITY): Payer: Self-pay | Admitting: Emergency Medicine

## 2020-10-11 ENCOUNTER — Emergency Department (HOSPITAL_COMMUNITY)
Admission: EM | Admit: 2020-10-11 | Discharge: 2020-10-12 | Disposition: A | Discharge status: Other Acute Inpatient Hospital | Payer: Self-pay | Attending: Emergency Medicine | Admitting: Emergency Medicine

## 2020-10-11 ENCOUNTER — Other Ambulatory Visit: Payer: Self-pay

## 2020-10-11 DIAGNOSIS — F319 Bipolar disorder, unspecified: Secondary | ICD-10-CM | POA: Diagnosis present

## 2020-10-11 DIAGNOSIS — Z20822 Contact with and (suspected) exposure to covid-19: Secondary | ICD-10-CM | POA: Insufficient documentation

## 2020-10-11 DIAGNOSIS — F3130 Bipolar disorder, current episode depressed, mild or moderate severity, unspecified: Secondary | ICD-10-CM | POA: Insufficient documentation

## 2020-10-11 DIAGNOSIS — F1994 Other psychoactive substance use, unspecified with psychoactive substance-induced mood disorder: Secondary | ICD-10-CM | POA: Diagnosis present

## 2020-10-11 DIAGNOSIS — F1924 Other psychoactive substance dependence with psychoactive substance-induced mood disorder: Secondary | ICD-10-CM | POA: Insufficient documentation

## 2020-10-11 DIAGNOSIS — J45909 Unspecified asthma, uncomplicated: Secondary | ICD-10-CM | POA: Insufficient documentation

## 2020-10-11 DIAGNOSIS — F17210 Nicotine dependence, cigarettes, uncomplicated: Secondary | ICD-10-CM | POA: Insufficient documentation

## 2020-10-11 DIAGNOSIS — R45851 Suicidal ideations: Secondary | ICD-10-CM | POA: Insufficient documentation

## 2020-10-11 LAB — CBC WITH DIFFERENTIAL/PLATELET
Abs Immature Granulocytes: 0.04 10*3/uL (ref 0.00–0.07)
Basophils Absolute: 0 10*3/uL (ref 0.0–0.1)
Basophils Relative: 0 %
Eosinophils Absolute: 0.2 10*3/uL (ref 0.0–0.5)
Eosinophils Relative: 2 %
HCT: 39.6 % (ref 39.0–52.0)
Hemoglobin: 13.7 g/dL (ref 13.0–17.0)
Immature Granulocytes: 0 %
Lymphocytes Relative: 15 %
Lymphs Abs: 1.5 10*3/uL (ref 0.7–4.0)
MCH: 30.6 pg (ref 26.0–34.0)
MCHC: 34.6 g/dL (ref 30.0–36.0)
MCV: 88.4 fL (ref 80.0–100.0)
Monocytes Absolute: 1.1 10*3/uL — ABNORMAL HIGH (ref 0.1–1.0)
Monocytes Relative: 10 %
Neutro Abs: 7.4 10*3/uL (ref 1.7–7.7)
Neutrophils Relative %: 73 %
Platelets: 362 10*3/uL (ref 150–400)
RBC: 4.48 MIL/uL (ref 4.22–5.81)
RDW: 12.6 % (ref 11.5–15.5)
WBC: 10.3 10*3/uL (ref 4.0–10.5)
nRBC: 0 % (ref 0.0–0.2)

## 2020-10-11 LAB — COMPREHENSIVE METABOLIC PANEL
ALT: 21 U/L (ref 0–44)
AST: 24 U/L (ref 15–41)
Albumin: 4.2 g/dL (ref 3.5–5.0)
Alkaline Phosphatase: 77 U/L (ref 38–126)
Anion gap: 10 (ref 5–15)
BUN: 11 mg/dL (ref 6–20)
CO2: 23 mmol/L (ref 22–32)
Calcium: 9 mg/dL (ref 8.9–10.3)
Chloride: 99 mmol/L (ref 98–111)
Creatinine, Ser: 0.78 mg/dL (ref 0.61–1.24)
GFR, Estimated: >60 mL/min (ref >60)
Glucose, Bld: 134 mg/dL — ABNORMAL HIGH (ref 70–99)
Potassium: 3.5 mmol/L (ref 3.5–5.1)
Sodium: 132 mmol/L — ABNORMAL LOW (ref 135–145)
Total Bilirubin: 1.1 mg/dL (ref 0.3–1.2)
Total Protein: 6.9 g/dL (ref 6.5–8.1)

## 2020-10-11 LAB — SALICYLATE LEVEL: Salicylate Lvl: <7 mg/dL — ABNORMAL LOW (ref 7.0–30.0)

## 2020-10-11 LAB — RESP PANEL BY RT-PCR (FLU A&B, COVID) ARPGX2
Influenza A by PCR: NEGATIVE
Influenza B by PCR: NEGATIVE
SARS Coronavirus 2 by RT PCR: NEGATIVE

## 2020-10-11 LAB — ACETAMINOPHEN LEVEL: Acetaminophen (Tylenol), Serum: <10 ug/mL — ABNORMAL LOW (ref 10–30)

## 2020-10-11 LAB — ETHANOL: Alcohol, Ethyl (B): <10 mg/dL (ref <10)

## 2020-10-11 LAB — RAPID URINE DRUG SCREEN, HOSP PERFORMED
Amphetamines: NOT DETECTED
Barbiturates: NOT DETECTED
Benzodiazepines: NOT DETECTED
Cocaine: NOT DETECTED
Opiates: NOT DETECTED
Tetrahydrocannabinol: NOT DETECTED

## 2020-10-11 MED ORDER — LORAZEPAM 1 MG PO TABS
0.0000 mg | ORAL_TABLET | Freq: Four times a day (QID) | ORAL | Status: DC
  Administered 2020-10-11: 1 mg via ORAL
  Filled 2020-10-11: qty 1

## 2020-10-11 MED ORDER — TIAGABINE HCL 4 MG PO TABS
4.0000 mg | ORAL_TABLET | Freq: Every day | ORAL | Status: DC
  Filled 2020-10-11 (×2): qty 1

## 2020-10-11 MED ORDER — LORAZEPAM 2 MG/ML IJ SOLN
0.0000 mg | Freq: Four times a day (QID) | INTRAMUSCULAR | Status: DC
Start: 2020-10-11 — End: 2020-10-12

## 2020-10-11 MED ORDER — THIAMINE HCL 100 MG PO TABS
100.0000 mg | ORAL_TABLET | Freq: Every day | ORAL | Status: DC
Start: 2020-10-11 — End: 2020-10-12
  Administered 2020-10-11: 100 mg via ORAL
  Filled 2020-10-11: qty 1

## 2020-10-11 MED ORDER — DOXEPIN HCL 50 MG PO CAPS
50.0000 mg | ORAL_CAPSULE | Freq: Every day | ORAL | Status: DC
  Administered 2020-10-11: 50 mg via ORAL
  Filled 2020-10-11 (×2): qty 1

## 2020-10-11 MED ORDER — LORAZEPAM 2 MG/ML IJ SOLN: 0.0000 mg | Freq: Two times a day (BID) | INTRAMUSCULAR | Status: DC

## 2020-10-11 MED ORDER — NICOTINE POLACRILEX 2 MG MT GUM: 2.0000 mg | CHEWING_GUM | OROMUCOSAL | Status: DC | PRN

## 2020-10-11 MED ORDER — LORAZEPAM 1 MG PO TABS: 0.0000 mg | ORAL_TABLET | Freq: Two times a day (BID) | ORAL | Status: DC

## 2020-10-11 MED ORDER — DIVALPROEX SODIUM ER 500 MG PO TB24
1000.0000 mg | ORAL_TABLET | Freq: Every day | ORAL | Status: DC
  Administered 2020-10-11: 1000 mg via ORAL
  Filled 2020-10-11: qty 2

## 2020-10-11 MED ORDER — THIAMINE HCL 100 MG/ML IJ SOLN: 100.0000 mg | Freq: Every day | INTRAMUSCULAR | Status: DC

## 2020-10-11 MED ORDER — OLANZAPINE 5 MG PO TBDP
20.0000 mg | ORAL_TABLET | Freq: Every day | ORAL | Status: DC
  Administered 2020-10-11: 20 mg via ORAL
  Filled 2020-10-11: qty 4
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
## 2020-10-11 NOTE — ED Provider Notes (Signed)
Emergency Medicine Provider Triage Evaluation Note  Brett House , a 35 y.o. male  was evaluated in triage.  Pt complains of alcohol detox and suicidal ideation.  Patient reports that he normally drinks 1/5 of liquor daily, patient last had any alcohol prior to coming to the emergency department.  Patient states that he had one 24 ounce beer today.  Patient endorses suicidal ideation.  States that he has a plan to take his friend's gun and shoot himself.  Patient endorses previous suicidal ideations and attempts.  Patient denies any homicidal ideations or visual hallucinations.  Patient endorses auditory hallucinations, states that he hears voices.  Patient declines to go into further details about his voices as he states "this makes them worse."  Additionally patient reports that he noted a rash to right lower abdomen and right upper arm when changing his clothes today.  Review of Systems  Positive: Suicidal ideations, auditory hallucinations, rash Negative: Homicidal ideation, visual hallucination  Physical Exam  BP 117/74   Pulse 91   Temp 98.5 F (36.9 C) (Oral)   Resp 14   SpO2 98%  Gen:   Awake, no distress   Resp:  Normal effort, lungs clear to auscultation bilaterally MSK:   Moves extremities without difficulty,  Other:  Abdomen soft, nondistended, nontender.  Erythematous macules and papules noted to right lower quadrant right upper arm.  No purulent discharge noted.  No mass to palpation.  No purpura or petechia.  Medical Decision Making  Medically screening exam initiated at 4:16 PM.  Appropriate orders placed.  Brett House was informed that the remainder of the evaluation will be completed by another provider, this initial triage assessment does not replace that evaluation, and the importance of remaining in the ED until their evaluation is complete.  The patient appears stable so that the remainder of the work up may be completed by another provider.      Haskel Schroeder,  PA-C 10/11/20 1619    Charlynne Pander, MD 10/11/20 [phone]

## 2020-10-11 NOTE — ED Triage Notes (Signed)
Pt requesting detox from ETOH, states he normally drinks a fifth of liquor everyday. Last drink prior to checking in. Also reports feeling suicidal.

## 2020-10-11 NOTE — ED Notes (Signed)
Pt's belongings inventoried, 1 belonging bag placed in locker 1. Wallet locked up w/ security

## 2020-10-11 NOTE — BH Assessment (Signed)
Disposition:  Per Melbourne Abts, PA pt meets inpatient criteria. @2316 , [facility] AC (Tosin, RN) notified and bed availability is currently under review. RN/EDP informed of dispo via Epic secure chat.

## 2020-10-11 NOTE — ED Notes (Signed)
Pt sleeping at this time for ciwa

## 2020-10-11 NOTE — ED Notes (Signed)
Received verbal report from Callie S RN at this time °

## 2020-10-11 NOTE — ED Provider Notes (Signed)
[facility] EMERGENCY DEPARTMENT Provider Note   CSN: [id number] Arrival date & time: 10/11/20  1551     History Chief Complaint  Patient presents with   Alcohol Problem   Suicidal    Brett House is a 35 y.o. male.  The history is provided by the patient. No language interpreter was used.  Mental Health Problem Presenting symptoms: suicidal thoughts   Degree of incapacity (severity):  Severe Timing:  Constant Progression:  Worsening Relieved by:  Nothing Worsened by:  Nothing Ineffective treatments:  None tried Pt reports he does not want to live anymore.  Pt reports he plans to kill himself.  Pt reports he has access to a gun.  Pt also has alcohol and substance abuse problems.     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 No pertinent surgical history.     No family history on file.  Social History   Tobacco Use   Smoking status: Every Day    Packs/day: 1.50    Years: 15.00    Pack years: 22.50    Types: Cigarettes   Smokeless tobacco: Current  Vaping Use   Vaping Use: Never used  Substance Use Topics   Alcohol use: Yes    Comment: 6 pack once a week for last 8-9 months   Drug use: Yes    Types: Marijuana    Comment: rarely    Home Medications Prior to Admission medications   Medication Sig Start Date End Date Taking? Authorizing Provider  divalproex (DEPAKOTE ER) 500 MG 24 hr tablet Take 2 tablets (1,000 mg total) by mouth at bedtime. 10/01/20   Laveda Abbe, NP   doxepin (SINEQUAN) 50 MG capsule Take 1 capsule (50 mg total) by mouth at bedtime. 10/01/20   Laveda Abbe, NP  nicotine polacrilex (NICORETTE) 2 MG gum Take 1 each (2 mg total) by mouth as needed for smoking cessation. You are encouraged to continue smoking cessation. 10/01/20   Laveda Abbe, NP  OLANZapine zydis (ZYPREXA) 20 MG disintegrating tablet Take 1 tablet (20 mg total) by mouth at bedtime. 10/01/20   Laveda Abbe, NP  tiaGABine (GABITRIL) 4 MG tablet Take 1 tablet (4 mg total) by mouth at bedtime. 10/01/20   Laveda Abbe, NP    Allergies    Erythromycin  Review of Systems   Review of Systems  Psychiatric/Behavioral:  Positive for suicidal ideas.   All other systems reviewed and are negative.  Physical Exam Updated Vital Signs BP 137/66   Pulse (!) 106   Temp 98.5 F (36.9 C) (Oral)   Resp 18   SpO2 97%   Physical Exam Vitals and nursing note reviewed.  Constitutional:      Appearance: He is well-developed.  HENT:     Head: Normocephalic.  Cardiovascular:     Rate and Rhythm: Normal rate.  Pulmonary:     Effort: Pulmonary effort is normal.  Abdominal:     General:  Abdomen is flat. There is no distension.  Musculoskeletal:        General: Normal range of motion.     Cervical back: Normal range of motion.  Skin:    General: Skin is warm.  Neurological:     Mental Status: He is alert and oriented to person, place, and time.    ED Results / Procedures / Treatments   Labs (all labs ordered are listed, but only abnormal results are displayed) Labs Reviewed  COMPREHENSIVE METABOLIC PANEL - Abnormal; Notable for the following components:      Result Value   Sodium 132 (*)    Glucose, Bld 134 (*)    All other components within normal limits  CBC WITH DIFFERENTIAL/PLATELET - Abnormal; Notable for the following components:   Monocytes Absolute 1.1 (*)    All other components within normal limits  SALICYLATE LEVEL - Abnormal; Notable  for the following components:   Salicylate Lvl <7.0 (*)    All other components within normal limits  ACETAMINOPHEN LEVEL - Abnormal; Notable for the following components:   Acetaminophen (Tylenol), Serum <10 (*)    All other components within normal limits  RESP PANEL BY RT-PCR (FLU A&B, COVID) ARPGX2  ETHANOL  RAPID URINE DRUG SCREEN, HOSP PERFORMED    EKG None  Radiology No results found.  Procedures Procedures   Medications Ordered in ED Medications  LORazepam (ATIVAN) injection 0-4 mg ( Intravenous See Alternative 10/11/20 1729)    Or  LORazepam (ATIVAN) tablet 0-4 mg (1 mg Oral Given 10/11/20 1729)  LORazepam (ATIVAN) injection 0-4 mg (has no administration in time range)    Or  LORazepam (ATIVAN) tablet 0-4 mg (has no administration in time range)  thiamine tablet 100 mg (100 mg Oral Given 10/11/20 1728)    Or  thiamine (B-1) injection 100 mg ( Intravenous See Alternative 10/11/20 1728)    ED Course  I have reviewed the triage vital signs and the nursing notes.  Pertinent labs & imaging results that were available during my care of the patient were reviewed by me and considered in my medical decision making (see chart for details).    MDM Rules/Calculators/A&P                           MDM:  Tts consulted.  Pt is suicidal with a plan.   Final Clinical Impression(s) / ED Diagnoses Final diagnoses:  None    Rx / DC Orders ED Discharge Orders     None        Osie Cheeks 10/11/20 Margretta Ditty    Margarita Grizzle, MD 10/12/20 1328

## 2020-10-12 ENCOUNTER — Encounter (HOSPITAL_COMMUNITY): Payer: Self-pay | Admitting: Registered Nurse

## 2020-10-12 ENCOUNTER — Other Ambulatory Visit: Payer: Self-pay | Admitting: Registered Nurse

## 2020-10-12 ENCOUNTER — Inpatient Hospital Stay (HOSPITAL_COMMUNITY)
Admission: AD | Admit: 2020-10-12 | Discharge: 2020-10-16 | DRG: 885 | Disposition: A | Discharge status: Home / Self Care | Payer: Federal, State, Local not specified - Other | Source: Intra-hospital | Attending: Psychiatry | Admitting: Psychiatry

## 2020-10-12 DIAGNOSIS — R45850 Homicidal ideations: Secondary | ICD-10-CM | POA: Diagnosis present

## 2020-10-12 DIAGNOSIS — G4700 Insomnia, unspecified: Secondary | ICD-10-CM | POA: Diagnosis present

## 2020-10-12 DIAGNOSIS — R45851 Suicidal ideations: Secondary | ICD-10-CM | POA: Diagnosis present

## 2020-10-12 DIAGNOSIS — F319 Bipolar disorder, unspecified: Secondary | ICD-10-CM | POA: Diagnosis present

## 2020-10-12 DIAGNOSIS — R569 Unspecified convulsions: Secondary | ICD-10-CM | POA: Diagnosis present

## 2020-10-12 DIAGNOSIS — F419 Anxiety disorder, unspecified: Secondary | ICD-10-CM | POA: Diagnosis present

## 2020-10-12 DIAGNOSIS — F314 Bipolar disorder, current episode depressed, severe, without psychotic features: Principal | ICD-10-CM | POA: Diagnosis present

## 2020-10-12 DIAGNOSIS — Z9151 Personal history of suicidal behavior: Secondary | ICD-10-CM

## 2020-10-12 DIAGNOSIS — F17210 Nicotine dependence, cigarettes, uncomplicated: Secondary | ICD-10-CM | POA: Diagnosis present

## 2020-10-12 DIAGNOSIS — F1994 Other psychoactive substance use, unspecified with psychoactive substance-induced mood disorder: Secondary | ICD-10-CM

## 2020-10-12 LAB — VALPROIC ACID LEVEL: Valproic Acid Lvl: 29 ug/mL — ABNORMAL LOW (ref 50.0–100.0)

## 2020-10-12 MED ORDER — NICOTINE 21 MG/24HR TD PT24
21.0000 mg | MEDICATED_PATCH | Freq: Every day | TRANSDERMAL | Status: DC
  Administered 2020-10-12: 21 mg via TRANSDERMAL
  Filled 2020-10-12: qty 1

## 2020-10-12 MED ORDER — ZIPRASIDONE MESYLATE 20 MG IM SOLR: 20.0000 mg | Freq: Two times a day (BID) | INTRAMUSCULAR | Status: DC | PRN

## 2020-10-12 MED ORDER — HYDROXYZINE HCL 25 MG PO TABS
25.0000 mg | ORAL_TABLET | Freq: Three times a day (TID) | ORAL | Status: DC | PRN
  Administered 2020-10-14 – 2020-10-15 (×2): 25 mg via ORAL
  Filled 2020-10-12: qty 1
  Filled 2020-10-12: qty 20
  Filled 2020-10-12: qty 1

## 2020-10-12 MED ORDER — VALACYCLOVIR HCL 500 MG PO TABS
1000.0000 mg | ORAL_TABLET | Freq: Three times a day (TID) | ORAL | Status: DC
  Administered 2020-10-12 – 2020-10-16 (×11): 1000 mg via ORAL
  Filled 2020-10-12 (×15): qty 2

## 2020-10-12 MED ORDER — ACETAMINOPHEN 325 MG PO TABS: 650.0000 mg | ORAL_TABLET | Freq: Four times a day (QID) | ORAL | Status: DC | PRN

## 2020-10-12 MED ORDER — ALUM & MAG HYDROXIDE-SIMETH 200-200-20 MG/5ML PO SUSP: 30.0000 mL | ORAL | Status: DC | PRN

## 2020-10-12 MED ORDER — HYDROCORTISONE 1 % EX CREA
TOPICAL_CREAM | Freq: Two times a day (BID) | CUTANEOUS | Status: DC
  Administered 2020-10-14 (×2): 1 via TOPICAL
  Filled 2020-10-12: qty 28

## 2020-10-12 MED ORDER — LORAZEPAM 1 MG PO TABS: 1.0000 mg | ORAL_TABLET | Freq: Four times a day (QID) | ORAL | Status: DC | PRN

## 2020-10-12 MED ORDER — TIAGABINE HCL 4 MG PO TABS
4.0000 mg | ORAL_TABLET | Freq: Every day | ORAL | Status: DC
  Administered 2020-10-12 – 2020-10-13 (×2): 4 mg via ORAL
  Filled 2020-10-12 (×5): qty 1

## 2020-10-12 MED ORDER — OLANZAPINE 10 MG PO TBDP
10.0000 mg | ORAL_TABLET | Freq: Three times a day (TID) | ORAL | Status: DC | PRN
  Administered 2020-10-14: 10 mg via ORAL
  Filled 2020-10-12: qty 1

## 2020-10-12 MED ORDER — MAGNESIUM HYDROXIDE 400 MG/5ML PO SUSP: 30.0000 mL | Freq: Every day | ORAL | Status: DC | PRN

## 2020-10-12 MED ORDER — TRAZODONE HCL 50 MG PO TABS
50.0000 mg | ORAL_TABLET | Freq: Every evening | ORAL | Status: DC | PRN
Start: 2020-10-12 — End: 2020-10-13

## 2020-10-12 MED ORDER — NICOTINE 21 MG/24HR TD PT24
MEDICATED_PATCH | TRANSDERMAL | Status: AC
  Filled 2020-10-12: qty 1

## 2020-10-12 MED ORDER — OLANZAPINE 10 MG PO TBDP
20.0000 mg | ORAL_TABLET | Freq: Every day | ORAL | Status: DC
  Administered 2020-10-12 – 2020-10-15 (×4): 20 mg via ORAL
  Filled 2020-10-12 (×6): qty 2

## 2020-10-12 MED ORDER — LORAZEPAM 1 MG PO TABS
1.0000 mg | ORAL_TABLET | Freq: Four times a day (QID) | ORAL | Status: DC | PRN
  Administered 2020-10-15: 1 mg via ORAL
  Filled 2020-10-12: qty 1

## 2020-10-12 MED ORDER — OLANZAPINE 7.5 MG PO TABS: 7.5000 mg | ORAL_TABLET | Freq: Every day | ORAL | Status: DC

## 2020-10-12 MED ORDER — DOXEPIN HCL 25 MG PO CAPS
25.0000 mg | ORAL_CAPSULE | Freq: Every evening | ORAL | Status: DC | PRN
  Administered 2020-10-13: 25 mg via ORAL
  Filled 2020-10-12 (×2): qty 1

## 2020-10-12 MED ORDER — HYDROCORTISONE 1 % EX OINT
TOPICAL_OINTMENT | Freq: Two times a day (BID) | CUTANEOUS | Status: DC
  Filled 2020-10-12: qty 28.35

## 2020-10-12 MED ORDER — ACETAMINOPHEN 325 MG PO TABS
650.0000 mg | ORAL_TABLET | Freq: Four times a day (QID) | ORAL | Status: DC | PRN
  Administered 2020-10-12 – 2020-10-14 (×4): 650 mg via ORAL
  Filled 2020-10-12 (×4): qty 2

## 2020-10-12 MED ORDER — NICOTINE 21 MG/24HR TD PT24
21.0000 mg | MEDICATED_PATCH | Freq: Every day | TRANSDERMAL | Status: DC
  Administered 2020-10-12 – 2020-10-15 (×4): 21 mg via TRANSDERMAL
  Filled 2020-10-12 (×6): qty 1

## 2020-10-12 MED ORDER — DIVALPROEX SODIUM ER 500 MG PO TB24
1000.0000 mg | ORAL_TABLET | Freq: Every day | ORAL | Status: DC
  Administered 2020-10-12 – 2020-10-15 (×4): 1000 mg via ORAL
  Filled 2020-10-12 (×6): qty 2

## 2020-10-12 NOTE — Progress Notes (Signed)
Pt accepted to [facility] 305-1   Patient meets inpatient criteria per Melbourne Abts, PA-C   Dr.Clary is the attending provider.    Call report to [phone]    Vivi Ferns, RN @ [facility] ED notified.     Pt scheduled  to arrive at [facility] today after 1500.   Damita Dunnings, MSW, LCSW-A  2:31 PM 10/12/2020
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
## 2020-10-12 NOTE — Tx Team (Signed)
Initial Treatment Plan 10/12/2020 7:20 PM [name] [id number]:[id number]    PATIENT STRESSORS: Financial difficulties Substance abuse   PATIENT STRENGTHS: Average or above average intelligence Motivation for treatment/growth   PATIENT IDENTIFIED PROBLEMS: Substance Abuse Homelessness And Depression                     DISCHARGE CRITERIA:  Improved stabilization in mood, thinking, and/or behavior  PRELIMINARY DISCHARGE PLAN: Attend 12-step recovery group  PATIENT/FAMILY INVOLVEMENT: This treatment plan has been presented to and reviewed with the patient, Brett House, and/or family member.  The patient and family have been given the opportunity to ask questions and make suggestions.  Ginger Carne, RN 10/12/2020, 7:20 PM

## 2020-10-12 NOTE — ED Notes (Signed)
Safe transport to unit to transfer pt to [facility] Adult unit per MD order. 1 personal property bag and valuables from security given to transport for transfer. Ambulatory off unit. No s/s of acute distress noted at discharge.

## 2020-10-12 NOTE — Progress Notes (Signed)
Patient information has been sent to [facility] via secure chat to review for potential admission. Patient meets inpatient criteria per Melbourne Abts, PA-C.   Situation ongoing, CSW will continue to monitor progress.    Signed:  Damita Dunnings, MSW, LCSW-A  10/12/2020 8:31 AM

## 2020-10-12 NOTE — Consult Note (Signed)
Telepsych Consultation   Reason for Consult:  Suicidal ideation Referring Physician:  Osie CheeksSofia, Leslie K, PA-C [facility] Location of Provider: Other: [facility]  Patient Identification: Brett HolsterBryan House MRN:  [id number] Principal Diagnosis: Bipolar I disorder, current episode depressed (HCC) Diagnosis:  Principal Problem:   Bipolar I disorder, current episode depressed (HCC) Active Problems:   Suicidal ideation   Substance induced mood disorder (HCC)   Total Time spent with patient: 30 minutes  Subjective:   Brett HolsterBryan House is a 35 y.o. male patient [facility] after presenting with complaints of suicidal ideation and requesting alcohol detox.  HPI:  Brett HolsterBryan House, 35 y.o., male patient seen via tele health by this provider, consulted with Dr. Earlene PlaterKatherine Laubach; and chart reviewed on 10/12/20.  On evaluation Brett HolsterBryan House reports he came to the hospital because he was having suicidal thoughts.  Patient states he was recently discharged from psychiatric hospital but did not follow-up with any resources that were given.  Reports he has not taken any of his medications since he was discharged from the from hospital patient reports he has used meth and ecstasy call last time was 3 days ago.  Patient reports he is hearing voices calling his name and people whispering in his ear.  Patient also states that he is having homicidal thoughts but then changes it to "People are following me around.  I get a plan; I got a weapon put up and if anybody tries to bother me I used it."  Patient 1 stay what type of weapon he had he did.  When endorsing suicidal ideation patient reported that he had a friend that had a gun and he had access to it would not give the name of a friend.  Patient reported he lives alone.   During evaluation Brett HolsterBryan House is elevated up in bed in no acute distress.  He is alert, oriented x 4, calm and cooperative.  His mood is depressed with congruent affect.  He does not appear to  be responding to internal/external stimuli or delusional thoughts; but is reporting auditory hallucinations and paranoia.   Patient continues to endorse suicidal ideation, paranoia, and auditory hallucinations.  Patient also making vague statements related to having a weapon he did because he feels like people are following him.  Patient recommended for inpatient psychiatric treatment.  Past Psychiatric History: Polysubstance abuse, substance-induced mood disorder, bipolar 1 disorder  Risk to Self: Yes Risk to Others: Yes Prior Inpatient Therapy: Yes Prior Outpatient Therapy: Yes  Past Medical History:  Past Medical History:  Diagnosis Date   Anxiety    Asthma    Bipolar disorder (HCC)    Depression    History reviewed. No pertinent surgical history. Family History: History reviewed. No pertinent family history. Family Psychiatric  History: Unaware  Social History:  Social History   Substance and Sexual Activity  Alcohol Use Yes   Comment: 6 pack once a week for last 8-9 months     Social History   Substance and Sexual Activity  Drug Use Yes   Types: Marijuana   Comment: rarely    Social History   Socioeconomic History   Marital status: Single    Spouse name: Not on file   Number of children: 0   Years of education: Not on file   Highest education level: 12th grade  Occupational History   Occupation: Unemployed  Tobacco Use   Smoking status: Every Day    Packs/day: 1.50  Years: 15.00    Pack years: 22.50    Types: Cigarettes   Smokeless tobacco: Current  Vaping Use   Vaping Use: Never used  Substance and Sexual Activity   Alcohol use: Yes    Comment: 6 pack once a week for last 8-9 months   Drug use: Yes    Types: Marijuana    Comment: rarely   Sexual activity: Yes  Other Topics Concern   Not on file  Social History Narrative   Not on file   Social Determinants of Health   Financial Resource Strain: Not on file  Food Insecurity: Not on file   Transportation Needs: Not on file  Physical Activity: Not on file  Stress: Not on file  Social Connections: Not on file   Additional Social History:    Allergies:   Allergies  Allergen Reactions   Erythromycin Other (See Comments)    Unknown childhood allergic reaction    Labs:  Results for orders placed or performed during the hospital encounter of 10/11/20 (from the past 48 hour(s))  Comprehensive metabolic panel     Status: Abnormal   Collection Time: 10/11/20  4:16 PM  Result Value Ref Range   Sodium 132 (L) 135 - 145 mmol/L   Potassium 3.5 3.5 - 5.1 mmol/L   Chloride 99 98 - 111 mmol/L   CO2 23 22 - 32 mmol/L   Glucose, Bld 134 (H) 70 - 99 mg/dL    Comment: Glucose reference range applies only to samples taken after fasting for at least 8 hours.   BUN 11 6 - 20 mg/dL   Creatinine, Ser 3.35 0.61 - 1.24 mg/dL   Calcium 9.0 8.9 - 45.6 mg/dL   Total Protein 6.9 6.5 - 8.1 g/dL   Albumin 4.2 3.5 - 5.0 g/dL   AST 24 15 - 41 U/L   ALT 21 0 - 44 U/L   Alkaline Phosphatase 77 38 - 126 U/L   Total Bilirubin 1.1 0.3 - 1.2 mg/dL   GFR, Estimated >25 >63 mL/min    Comment: (NOTE) Calculated using the CKD-EPI Creatinine Equation (2021)    Anion gap 10 5 - 15    Comment: Performed at [facility], [address], [place], [place] [address]  Ethanol     Status: None   Collection Time: 10/11/20  4:16 PM  Result Value Ref Range   Alcohol, Ethyl (B) <10 <10 mg/dL    Comment: (NOTE) Lowest detectable limit for serum alcohol is 10 mg/dL.  For medical purposes only. Performed at [facility], [address] [place], [place] [address]   CBC with Diff     Status: Abnormal   Collection Time: 10/11/20  4:16 PM  Result Value Ref Range   WBC 10.3 4.0 - 10.5 K/uL   RBC 4.48 4.22 - 5.81 MIL/uL   Hemoglobin 13.7 13.0 - 17.0 g/dL   HCT 81.1 57.2 - 62.0 %   MCV 88.4 80.0 - 100.0 fL   MCH 30.6 26.0 - 34.0 pg   MCHC 34.6 30.0 - 36.0 g/dL   RDW 35.5 97.4 - 16.3 %    Platelets 362 150 - 400 K/uL   nRBC 0.0 0.0 - 0.2 %   Neutrophils Relative % 73 %   Neutro Abs 7.4 1.7 - 7.7 K/uL   Lymphocytes Relative 15 %   Lymphs Abs 1.5 0.7 - 4.0 K/uL   Monocytes Relative 10 %   Monocytes Absolute 1.1 (H) 0.1 - 1.0  K/uL   Eosinophils Relative 2 %   Eosinophils Absolute 0.2 0.0 - 0.5 K/uL   Basophils Relative 0 %   Basophils Absolute 0.0 0.0 - 0.1 K/uL   Immature Granulocytes 0 %   Abs Immature Granulocytes 0.04 0.00 - 0.07 K/uL    Comment: Performed at [facility], [address], [place], [place] [address]  Salicylate level     Status: Abnormal   Collection Time: 10/11/20  4:16 PM  Result Value Ref Range   Salicylate Lvl <7.0 (L) 7.0 - 30.0 mg/dL    Comment: Performed at [facility], [address] [place], [place] [address]  Acetaminophen level     Status: Abnormal   Collection Time: 10/11/20  4:16 PM  Result Value Ref Range   Acetaminophen (Tylenol), Serum <10 (L) 10 - 30 ug/mL    Comment: (NOTE) Therapeutic concentrations vary significantly. A range of 10-30 ug/mL  may be an effective concentration for many patients. However, some  are best treated at concentrations outside of this range. Acetaminophen concentrations >150 ug/mL at 4 hours after ingestion  and >50 ug/mL at 12 hours after ingestion are often associated with  toxic reactions.  Performed at [facility], [address] [place], [place] [address]   Resp Panel by RT-PCR (Flu A&B, Covid) Nasopharyngeal Swab     Status: None   Collection Time: 10/11/20  6:05 PM   Specimen: Nasopharyngeal Swab; Nasopharyngeal(NP) swabs in vial transport medium  Result Value Ref Range   SARS Coronavirus 2 by RT PCR NEGATIVE NEGATIVE    Comment: (NOTE) SARS-CoV-2 target nucleic acids are NOT DETECTED.  The SARS-CoV-2 RNA is generally detectable in upper respiratory specimens during the acute phase of infection. The lowest concentration of SARS-CoV-2 viral copies this assay can detect  is 138 copies/mL. A negative result does not preclude SARS-Cov-2 infection and should not be used as the sole basis for treatment or other patient management decisions. A negative result may occur with  improper specimen collection/handling, submission of specimen other than nasopharyngeal swab, presence of viral mutation(s) within the areas targeted by this assay, and inadequate number of viral copies(<138 copies/mL). A negative result must be combined with clinical observations, patient history, and epidemiological information. The expected result is Negative.  Fact Sheet for Patients:  BloggerCourse.com  Fact Sheet for Healthcare Providers:  SeriousBroker.it  This test is no t yet approved or cleared by the [place] FDA and  has been authorized for detection and/or diagnosis of SARS-CoV-2 by FDA under an Emergency Use Authorization (EUA). This EUA will remain  in effect (meaning this test can be used) for the duration of the COVID-19 declaration under Section 564(b)(1) of the Act, 21 U.S.C.section 360bbb-3(b)(1), unless the authorization is terminated  or revoked sooner.       Influenza A by PCR NEGATIVE NEGATIVE   Influenza B by PCR NEGATIVE NEGATIVE    Comment: (NOTE) The Xpert Xpress SARS-CoV-2/FLU/RSV plus assay is intended as an aid in the diagnosis of influenza from Nasopharyngeal swab specimens and should not be used as a sole basis for treatment. Nasal washings and aspirates are unacceptable for Xpert Xpress SARS-CoV-2/FLU/RSV testing.  Fact Sheet for Patients: BloggerCourse.com  Fact Sheet for Healthcare Providers: SeriousBroker.it  This test is not yet approved or cleared by the [place] FDA and has been authorized for detection and/or diagnosis of SARS-CoV-2 by FDA under an Emergency Use Authorization (EUA). This EUA will remain in effect (meaning this  test can be used) for the duration of the COVID-19 declaration under Section 564(b)(1) of the Act, 21 U.S.C. section 360bbb-3(b)(1), unless the authorization is terminated or revoked.  Performed at [facility], [address], [place], [place] [address]   Urine rapid drug screen (hosp performed)     Status: None   Collection Time: 10/11/20  6:05 PM  Result Value Ref Range   Opiates NONE DETECTED NONE DETECTED   Cocaine NONE DETECTED NONE DETECTED   Benzodiazepines NONE DETECTED NONE DETECTED   Amphetamines NONE DETECTED NONE DETECTED   Tetrahydrocannabinol NONE DETECTED NONE DETECTED   Barbiturates NONE DETECTED NONE DETECTED    Comment: (NOTE) DRUG SCREEN FOR MEDICAL PURPOSES ONLY.  IF CONFIRMATION IS NEEDED FOR ANY PURPOSE, NOTIFY LAB WITHIN 5 DAYS.  LOWEST DETECTABLE LIMITS FOR URINE DRUG SCREEN Drug Class                     Cutoff (ng/mL) Amphetamine and metabolites    1000 Barbiturate and metabolites    200 Benzodiazepine                 200 Tricyclics and metabolites     300 Opiates and metabolites        300 Cocaine and metabolites        300 THC                            50 Performed at [facility], [address] [place], [place] [address]     Medications:  Current Facility-Administered Medications  Medication Dose Route Frequency Provider Last Rate Last Admin   divalproex (DEPAKOTE ER) 24 hr tablet 1,000 mg  1,000 mg Oral QHS Sofia, Leslie K, PA-C   1,000 mg at 10/11/20 2218   doxepin (SINEQUAN) capsule 50 mg  50 mg Oral QHS Sofia, Leslie K, [place]   50 mg at 10/11/20 2219   LORazepam (ATIVAN) injection 0-4 mg  0-4 mg Intravenous Q6H Haskel Schroeder, PA-C       Or   LORazepam (ATIVAN) tablet 0-4 mg  0-4 mg Oral Q6H Haskel Schroeder, PA-C   1 mg at 10/11/20 1729   [START ON 10/14/2020] LORazepam (ATIVAN) injection 0-4 mg  0-4 mg Intravenous Q12H Haskel Schroeder, PA-C       Or   [START ON 10/14/2020] LORazepam (ATIVAN) tablet 0-4 mg   0-4 mg Oral Q12H Badalamente, Peter R, PA-C       nicotine (NICODERM CQ - dosed in mg/24 hours) patch 21 mg  21 mg Transdermal Daily Margarita Grizzle, MD   21 mg at 10/12/20 1438   nicotine polacrilex (NICORETTE) gum 2 mg  2 mg Oral PRN Sofia, Leslie K, PA-C       OLANZapine zydis (ZYPREXA) disintegrating tablet 20 mg  20 mg Oral QHS Sofia, Leslie K, [place]   20 mg at 10/11/20 2218   thiamine tablet 100 mg  100 mg Oral Daily Haskel Schroeder, PA-C   100 mg at 10/11/20 1728   Or   thiamine (B-1) injection 100 mg  100 mg Intravenous Daily Haskel Schroeder, PA-C       tiaGABine (GABITRIL) tablet 4 mg  4 mg Oral QHS Elson Areas, [place]       Current Outpatient Medications  Medication Sig Dispense Refill   divalproex (DEPAKOTE ER) 500 MG 24 hr tablet Take 2 tablets (1,000 mg total)  by mouth at bedtime. (Patient not taking: Reported on 10/12/2020) 60 tablet 0   doxepin (SINEQUAN) 50 MG capsule Take 1 capsule (50 mg total) by mouth at bedtime. (Patient not taking: Reported on 10/12/2020) 30 capsule 0   nicotine polacrilex (NICORETTE) 2 MG gum Take 1 each (2 mg total) by mouth as needed for smoking cessation. You are encouraged to continue smoking cessation. (Patient not taking: Reported on 10/12/2020) 100 tablet 0   OLANZapine zydis (ZYPREXA) 20 MG disintegrating tablet Take 1 tablet (20 mg total) by mouth at bedtime. (Patient not taking: Reported on 10/12/2020) 30 tablet 0   tiaGABine (GABITRIL) 4 MG tablet Take 1 tablet (4 mg total) by mouth at bedtime. (Patient not taking: Reported on 10/12/2020) 30 tablet 0    Musculoskeletal: Strength & Muscle Tone: within normal limits Gait & Station: normal Patient leans: N/A  Psychiatric Specialty Exam:  Presentation  General Appearance: Appropriate for Environment  Eye Contact:Good  Speech:Clear and Coherent; Normal Rate  Speech Volume:Normal  Handedness:Right   Mood and Affect  Mood:Depressed  Affect:Congruent; Depressed   Thought  Process  Thought Processes:Coherent; Goal Directed  Descriptions of Associations:Intact  Orientation:Full (Time, Place and Person)  Thought Content:WDL; Paranoid Ideation  History of Schizophrenia/Schizoaffective disorder:No  Duration of Psychotic Symptoms:Greater than six months  Hallucinations:Hallucinations: Auditory Description of Auditory Hallucinations: Reports hearing voices "People calling my name"  Ideas of Reference:Paranoia  Suicidal Thoughts:Suicidal Thoughts: Yes, Active (Patient reporting he can get a gun from a friend) SI Active Intent and/or Plan: With Plan; Without Means to Carry Out  Homicidal Thoughts:Homicidal Thoughts: No (Denies homicidal ideation but states feels like someone is following him and he has weapon that is hid and will use on anyone who threatens him)   Sensorium  Memory:Immediate Good; Recent Good  Judgment:Fair  Insight:Fair   Executive Functions  Concentration:Good  Attention Span:Good  Recall:Good  Fund of Knowledge:Good  Language:Good   Psychomotor Activity  Psychomotor Activity:Psychomotor Activity: Normal   Assets  Assets:Communication Skills; Desire for Improvement; Resilience   Sleep  Sleep:Sleep: Good    Physical Exam: Physical Exam Vitals and nursing note reviewed. Exam conducted with a chaperone present.  Constitutional:      General: He is not in acute distress.    Appearance: Normal appearance. He is not ill-appearing.  Cardiovascular:     Rate and Rhythm: Normal rate.  Pulmonary:     Effort: Pulmonary effort is normal.  Neurological:     Mental Status: He is alert and oriented to person, place, and time.  Psychiatric:        Attention and Perception: He perceives auditory hallucinations.        Mood and Affect: Mood is depressed.        Speech: Speech normal.        Behavior: Behavior normal. Behavior is cooperative.        Thought Content: Thought content is paranoid. Thought content includes  suicidal ideation.        Cognition and Memory: Cognition and memory normal.        Judgment: Judgment is impulsive.   Review of Systems  Constitutional: Negative.   HENT: Negative.    Eyes: Negative.   Respiratory: Negative.    Cardiovascular: Negative.   Gastrointestinal: Negative.   Genitourinary: Negative.   Musculoskeletal: Negative.   Skin: Negative.   Neurological: Negative.   Endo/Heme/Allergies: Negative.   Psychiatric/Behavioral:  Positive for depression, hallucinations, substance abuse and suicidal ideas. The patient is nervous/anxious.  Blood pressure 110/66, pulse 90, temperature 98.4 F (36.9 C), temperature source Oral, resp. rate 16, SpO2 100 %. There is no height or weight on file to calculate BMI.  Treatment Plan Summary: Daily contact with patient to assess and evaluate symptoms and progress in treatment, Medication management, and Plan inpatient psychiatric treatment  Disposition: Inpatient psychiatric treatment  Patient accepted to [facility]  This service was provided via telemedicine using a 2-way, interactive audio and video technology.  Names of all persons participating in this telemedicine service and their role in this encounter. Name: Assunta Found Role: NP  Name: Dr. Earlene Plater Role: Psychiatrist  Name: Brett House Role: Patient  Name:  Role:     Assunta Found, NP 10/12/2020 3:26 PM

## 2020-10-12 NOTE — ED Notes (Signed)
Secretary to call Safe transport for transfer to [facility]

## 2020-10-12 NOTE — Progress Notes (Signed)
Patient did not attend the evening speaker AA meeting.  

## 2020-10-12 NOTE — Progress Notes (Signed)
Patient has been faxed out due to no bed availability at [facility]. Patient meets inpatient criteria per Melbourne Abts, PA-C. Patient referred to the following facilities:  [facility]  [address] [place] [address] [phone]  [facility]  [address] [place] [address] [phone]  [facility] [place] [facility]  [address] [place] [address] [phone]  [facility]  [address] [place] [address] [phone]  [facility]  [address] [place], [place] [address] [phone]  [facility]  [address] [place], [place] [address] [phone]  [facility]  [address] [place] [address] [phone]  [facility]  [address] [place] [address] [phone]  [facility]  [address], [place] [address] [phone]    CSW will continue to monitor disposition.     Damita Dunnings, MSW, LCSW-A  2:01 PM 10/12/2020

## 2020-10-13 DIAGNOSIS — F314 Bipolar disorder, current episode depressed, severe, without psychotic features: Secondary | ICD-10-CM | POA: Diagnosis not present
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
## 2020-10-13 NOTE — Progress Notes (Signed)
   10/13/20 0024  Psych Admission Type (Psych Patients Only)  Admission Status Voluntary  Psychosocial Assessment  Patient Complaints Depression;Self-harm thoughts  Eye Contact Brief  Facial Expression Flat  Affect Anxious;Appropriate to circumstance  Speech Logical/coherent  Interaction Assertive  Appearance/Hygiene Unremarkable  Behavior Characteristics Cooperative;Appropriate to situation  Mood Depressed;Pleasant  Thought Process  Coherency WDL  Content WDL  Delusions WDL  Perception WDL  Hallucination None reported or observed  Judgment WDL  Confusion WDL  Danger to Self  Current suicidal ideation? Passive  Agreement Not to Harm Self Yes  Description of Agreement contracts in hospital  Danger to Others  Danger to Others None reported or observed

## 2020-10-13 NOTE — [facility] Group Notes (Signed)
LCSW Group Therapy Note 10/13/2020  10:00-11:15am  Type of Therapy and Topic:  Group Therapy: Anger and Commonalities  Participation Level:  Active   Description of Group: In this group, patients initially shared an "unknown" fact about themselves and CSW led a discussion about the ways in which we have things in common without realizing it.  Patient then identified a recent time they became angry and how this yet again showed a way in which they had something in common with other patients.  We discussed possible unhealthy reactions to anger and possible healthy reactions.  We also discussed possible underlying emotions that lead to the anger.  Commonalities among group members were pointed out throughout the entirety of group.  Therapeutic Goals: Patients were asked to share something about themselves and learned that they often have things in common with other people without knowing this Patients will remember their last incident of anger and how they reacted Patients will be able to identify their reaction as healthy or unhealthy, and identify possible reactions that would have been the opposite Patients will learn that anger itself is a secondary emotion and will think about their primary emotion at the time of their last incident of anger  Summary of Patient Progress:  The patient shared that something interesting he could share about himself is that he loves fishing of any type.  This was expressed as also present in several other patients, so the patient was able to recognize that he is not alone.  The patient also said a frequent cause of anger is that in 07/09/2015 his mother died and said his sisters never told him that mother was sick, so he was not able to say goodbye to his mother.  He stated that he hates his sisters for doing this, but he has learned to forgive them because he also loves them.  However, he has not forgotten and still harbors resentment.  He participated fully in the  discussion and frequently made comments to other patients that were on topic and appropriate.  He left the group to see a provider in the last 10-15 minutes of group, did not return.  Therapeutic Modalities:   Cognitive Behavioral Therapy  Lynnell Chad, LCSW 10/13/2020  9:43 AM

## 2020-10-13 NOTE — [facility] Suicide Risk Assessment (Signed)
[facility] INPATIENT:  Family/Significant Other Suicide Prevention Education  Suicide Prevention Education:  Patient Refusal for Family/Significant Other Suicide Prevention Education: The patient Brett House has refused to provide written consent for family/significant other to be provided Family/Significant Other Suicide Prevention Education during admission and/or prior to discharge.  Physician notified.  Leisa Lenz 10/13/2020, 1:09 PM
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
## 2020-10-13 NOTE — Progress Notes (Signed)
Psychoeducational Group Note  Date:  10/13/2020 Time:  2015  Group Topic/Focus:  Wrap up group  Participation Level: Did Not Attend  Participation Quality:  Not Applicable  Affect:  Not Applicable  Cognitive:  Not Applicable  Insight:  Not Applicable  Engagement in Group: Not Applicable  Additional Comments:  Did not attend. Pt was notified that group was beginning but remained in room.   Marcille Buffy 10/13/2020, 9:05 PM
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
## 2020-10-13 NOTE — Plan of Care (Signed)
Guest was a previous patient here and knows general education information

## 2020-10-13 NOTE — Progress Notes (Addendum)
   10/13/20 1300  Psych Admission Type (Psych Patients Only)  Admission Status Voluntary  Psychosocial Assessment  Patient Complaints Anxiety;Hopelessness;Depression  Eye Contact Brief  Facial Expression Flat  Affect Anxious;Appropriate to circumstance  Speech Logical/coherent  Interaction Assertive  Motor Activity Slow  Appearance/Hygiene Unremarkable  Behavior Characteristics Cooperative;Anxious  Mood Anxious;Pleasant  Thought Process  Coherency WDL  Content WDL  Delusions WDL  Perception WDL  Hallucination None reported or observed  Judgment WDL  Confusion WDL  Danger to Self  Current suicidal ideation? Passive  Self-Injurious Behavior No self-injurious ideation or behavior indicators observed or expressed   Agreement Not to Harm Self Yes  Description of Agreement contracts in hospital  Danger to Others  Danger to Others None reported or observed  D. Pt presents as anxious, depressed upon initial approach- appropriate during interactions- observed participating in group led by SW Per pt's self inventory, pt rated his depression, hopelessness and anxiety all 5's. Pt endorsed passive SI- no plan- stated he was safe here in the hospital. A. Labs and vitals monitored. Pt given and educated on medications. Pt supported emotionally and encouraged to express concerns and ask questions.   R. Pt remains safe with 15 minute checks. Will continue POC.

## 2020-10-13 NOTE — Progress Notes (Signed)
   10/13/20 2243  Psych Admission Type (Psych Patients Only)  Admission Status Voluntary  Psychosocial Assessment  Patient Complaints Anxiety  Eye Contact Brief  Facial Expression Flat  Affect Appropriate to circumstance  Speech Logical/coherent  Interaction Assertive  Motor Activity Other (Comment) (WDL)  Appearance/Hygiene Unremarkable  Behavior Characteristics Appropriate to situation  Mood Anxious;Pleasant  Thought Process  Coherency WDL  Content WDL  Delusions None reported or observed  Perception WDL  Hallucination None reported or observed  Judgment Impaired  Confusion None  Danger to Self  Current suicidal ideation? Denies  Self-Injurious Behavior No self-injurious ideation or behavior indicators observed or expressed   Agreement Not to Harm Self No  Description of Agreement contracts in hospital  Danger to Others  Danger to Others None reported or observed

## 2020-10-14 DIAGNOSIS — F314 Bipolar disorder, current episode depressed, severe, without psychotic features: Secondary | ICD-10-CM | POA: Diagnosis not present

## 2020-10-14 MED ORDER — DOXEPIN HCL 25 MG PO CAPS: 50.0000 mg | ORAL_CAPSULE | Freq: Every evening | ORAL | Status: DC | PRN

## 2020-10-14 MED ORDER — TIAGABINE HCL 4 MG PO TABS
8.0000 mg | ORAL_TABLET | Freq: Every day | ORAL | Status: DC
  Administered 2020-10-14 – 2020-10-15 (×2): 8 mg via ORAL
  Filled 2020-10-14 (×3): qty 2
  Filled 2020-10-14: qty 1

## 2020-10-14 MED ORDER — DOXEPIN HCL 25 MG PO CAPS
75.0000 mg | ORAL_CAPSULE | Freq: Every evening | ORAL | Status: DC | PRN
  Filled 2020-10-14: qty 42

## 2020-10-14 NOTE — BHH Group Notes (Signed)
BHH Group Notes:  (Nursing/MHT/Case Management/Adjunct)  Date:  10/14/2020  Time:  10:03 AM  Type of Therapy:  Group Therapy  Participation Level:  Active  Participation Quality:  Appropriate  Affect:  Appropriate  Cognitive:  Appropriate  Insight:  Appropriate  Engagement in Group:  Engaged  Modes of Intervention:  Discussion  Summary of Progress/Problems: Pt understands the functions of unit, and does not have any concerns.  Brett House 10/14/2020, 10:03 AM

## 2020-10-14 NOTE — Progress Notes (Signed)
   10/14/20 2324  Psych Admission Type (Psych Patients Only)  Admission Status Voluntary  Psychosocial Assessment  Patient Complaints Anxiety  Eye Contact Brief  Facial Expression Flat  Affect Appropriate to circumstance  Speech Logical/coherent  Interaction Assertive  Motor Activity Other (Comment) (WDL)  Appearance/Hygiene Unremarkable  Behavior Characteristics Appropriate to situation  Mood Pleasant;Anxious  Thought Process  Coherency WDL  Content WDL  Delusions None reported or observed  Perception WDL  Hallucination None reported or observed  Judgment Impaired  Confusion None  Danger to Self  Current suicidal ideation? Denies  Self-Injurious Behavior No self-injurious ideation or behavior indicators observed or expressed   Agreement Not to Harm Self No  Description of Agreement contracts in hospital  Danger to Others  Danger to Others None reported or observed

## 2020-10-14 NOTE — Progress Notes (Signed)
[facility] MD Progress Note  10/14/2020 1:29 PM Tarl Cephas  MRN:  [id number]  Subjective: Ladavion reports, "I'm just feeling anxious about tomorrow whether I will be able to get into [facility] the [facility] or the [facility] treatment program. I'm trying to keep calm. I just finished taking a shower to help me relax some. My mood ain't best right now as a result. I slept fairly last night".  Objective: Patient is a 35 year old male with a reported past psychiatric history significant for bipolar disorder who presented to the [facility] emergency department on 09/21/2020 with auditory and visual hallucinations as well as suicidal and homicidal ideation.  The patient had had a psychiatric hospitalization in our facility in November 2021.  He admitted at that time that he had previous psychiatric hospitalizations in [place] as well as [facility] and 1 previously at old [place].  He had also reported that he had had 2 previous suicide attempts in 2013 when he had hung himself with a belt and then again in 2017 stepped out in front of traffic. Daily notes: Maksim is seen, chart reviewed. The chart findings discussed with the treatment team. This evaluation was conducted inside patient's room. He presents alert, oriented & aware of situation. He is visible on the unit, attending group sessions. He reports feeling very anxious & says he is worried about his chances of getting into a treatment program at the [facility] or the [facility] residential. It appears patient's major problems at hand is homelessness. He says the two places or facilities he is trying to get into will at least guaranteed him 30 days of a roof over his head while learning to stay clean from drugs & alcohol. He says he slept fairly last night.  Per documentation, he slept for about 5.5. He is taking & tolerating his treatment regimen. He denies any SIHI, VH, delusional thoughts or paranoia.There are no changes  made on his current plan of care. Will continue as already in progress. His vital signs remain stable.   Principal Problem: Bipolar 1 disorder, depressed, severe (HCC)  Diagnosis: Principal Problem:   Bipolar 1 disorder, depressed, severe (HCC) Active Problems:   Bipolar disorder (HCC)  Total Time spent with patient: 15 minutes  Past Psychiatric History: See H&P  Past Medical History:  Past Medical History:  Diagnosis Date   Anxiety    Asthma    Bipolar disorder (HCC)    Depression    History reviewed. No pertinent surgical history.  Family History: History reviewed. No pertinent family history.  Family Psychiatric  History: See H&P  Social History:  Social History   Substance and Sexual Activity  Alcohol Use Yes   Comment: 6 pack once a week for last 8-9 months     Social History   Substance and Sexual Activity  Drug Use Yes   Types: Marijuana   Comment: rarely    Social History   Socioeconomic History   Marital status: Single    Spouse name: Not on file   Number of children: 0   Years of education: Not on file   Highest education level: 12th grade  Occupational History   Occupation: Unemployed  Tobacco Use   Smoking status: Every Day    Packs/day: 1.50    Years: 15.00    Pack years: 22.50    Types: Cigarettes   Smokeless tobacco: Current  Vaping Use   Vaping Use: Never used  Substance and Sexual Activity  Alcohol use: Yes    Comment: 6 pack once a week for last 8-9 months   Drug use: Yes    Types: Marijuana    Comment: rarely   Sexual activity: Yes  Other Topics Concern   Not on file  Social History Narrative   Not on file   Social Determinants of Health   Financial Resource Strain: Not on file  Food Insecurity: Not on file  Transportation Needs: Not on file  Physical Activity: Not on file  Stress: Not on file  Social Connections: Not on file   Additional Social History:   Sleep: Fair  Appetite:  Good  Current  Medications: Current Facility-Administered Medications  Medication Dose Route Frequency Provider Last Rate Last Admin   acetaminophen (TYLENOL) tablet 650 mg  650 mg Oral Q6H PRN Antonieta Pert, MD   650 mg at 10/14/20 0807   alum & mag hydroxide-simeth (MAALOX/MYLANTA) 200-200-20 MG/5ML suspension 30 mL  30 mL Oral Q4H PRN Antonieta Pert, MD       divalproex (DEPAKOTE ER) 24 hr tablet 1,000 mg  1,000 mg Oral QHS Antonieta Pert, MD   1,000 mg at 10/13/20 2104   doxepin (SINEQUAN) capsule 50 mg  50 mg Oral QHS PRN Antonieta Pert, MD       hydrocortisone cream 1 %   Topical BID Antonieta Pert, MD   1 application at 10/14/20 0809   hydrOXYzine (ATARAX/VISTARIL) tablet 25 mg  25 mg Oral TID PRN Antonieta Pert, MD   25 mg at 10/14/20 1226   OLANZapine zydis (ZYPREXA) disintegrating tablet 10 mg  10 mg Oral Q8H PRN Antonieta Pert, MD       And   LORazepam (ATIVAN) tablet 1 mg  1 mg Oral Q6H PRN Antonieta Pert, MD       And   ziprasidone (GEODON) injection 20 mg  20 mg Intramuscular Q12H PRN Antonieta Pert, MD       LORazepam (ATIVAN) tablet 1 mg  1 mg Oral Q6H PRN Antonieta Pert, MD       magnesium hydroxide (MILK OF MAGNESIA) suspension 30 mL  30 mL Oral Daily PRN Antonieta Pert, MD       nicotine (NICODERM CQ - dosed in mg/24 hours) patch 21 mg  21 mg Transdermal Daily Antonieta Pert, MD   21 mg at 10/14/20 0808   OLANZapine zydis (ZYPREXA) disintegrating tablet 20 mg  20 mg Oral QHS Antonieta Pert, MD   20 mg at 10/13/20 2104   tiaGABine (GABITRIL) tablet 4 mg  4 mg Oral QHS Antonieta Pert, MD   4 mg at 10/13/20 2105   valACYclovir (VALTREX) tablet 1,000 mg  1,000 mg Oral TID Antonieta Pert, MD   1,000 mg at 10/14/20 1226   Lab Results:  Results for orders placed or performed during the hospital encounter of 10/12/20 (from the past 48 hour(s))  Valproic acid level     Status: Abnormal   Collection Time: 10/12/20  6:34 PM  Result Value  Ref Range   Valproic Acid Lvl 29 (L) 50.0 - 100.0 ug/mL    Comment: Performed at [facility], [address], [place], [place] [address]   Blood Alcohol level:  Lab Results  Component Value Date   [facility] <10 10/11/2020   ETH <10 10/06/2020   Metabolic Disorder Labs: Lab Results  Component Value Date   HGBA1C 5.2 09/27/2020   MPG  103 09/27/2020   MPG 102.54 01/24/2020   No results found for: PROLACTIN Lab Results  Component Value Date   CHOL 161 09/27/2020   TRIG 156 (H) 09/27/2020   HDL 52 09/27/2020   CHOLHDL 3.1 09/27/2020   VLDL 31 09/27/2020   LDLCALC 78 09/27/2020   LDLCALC 120 (H) 01/24/2020   Physical Findings: AIMS:  , ,  ,  ,    CIWA:    COWS:     Musculoskeletal: Strength & Muscle Tone: within normal limits Gait & Station:  remained in bed on exam Patient leans: N/A  Psychiatric Specialty Exam: Physical Exam Vitals and nursing note reviewed.  HENT:     Head: Normocephalic and atraumatic.     Nose: Nose normal.     Mouth/Throat:     Pharynx: Oropharynx is clear.  Eyes:     Pupils: Pupils are equal, round, and reactive to light.  Cardiovascular:     Rate and Rhythm: Normal rate.  Pulmonary:     Effort: Pulmonary effort is normal.  Genitourinary:    Comments: Deferred Musculoskeletal:        General: Normal range of motion.     Cervical back: Normal range of motion.  Skin:    General: Skin is warm and dry.  Neurological:     General: No focal deficit present.     Mental Status: He is alert and oriented to person, place, and time.    Review of Systems  Constitutional: Negative.  Negative for chills, diaphoresis and fever.  HENT: Negative.  Negative for congestion, sneezing and sore throat.   Eyes: Negative.   Respiratory: Negative.  Negative for apnea.   Cardiovascular:  Negative for chest pain and palpitations.  Gastrointestinal:  Negative for abdominal distention, abdominal pain, constipation, diarrhea, nausea and vomiting.   Genitourinary:  Negative for difficulty urinating.  Musculoskeletal: Negative.   Skin: Negative.   Allergic/Immunologic: Negative for environmental allergies, food allergies and immunocompromised state.       Allergies: E-mycin.  Neurological:  Negative for dizziness, tremors, seizures, syncope, facial asymmetry, speech difficulty, weakness, light-headedness, numbness and headaches.  Psychiatric/Behavioral:  Positive for dysphoric mood, hallucinations and sleep disturbance. Negative for agitation (Restlessness), behavioral problems, confusion, decreased concentration, self-injury and suicidal ideas. The patient is nervous/anxious. The patient is not hyperactive.    Blood pressure 112/70, pulse 74, temperature 97.7 F (36.5 C), temperature source Oral, resp. rate 18, height 5\' 11"  (1.803 m), weight 71.2 kg, SpO2 100 %.Body mass index is 21.9 kg/m.  General Appearance: Casual  Eye Contact:  Fair  Speech:  Clear and Coherent  Volume:  Normal  Mood:  Anxious  Affect:  Flat  Thought Process:  Coherent and Goal Directed  Orientation:  Full (Time, Place, and Person)  Thought Content:  Logical  Suicidal Thoughts:   Denies any thoughts, plans or intent.  Homicidal Thoughts:  No  Memory:  Negative NA Immediate;   Good Recent;   Good Remote;   Good  Judgement:  Fair  Insight:  Fair  Psychomotor Activity:  Normal  Concentration:  Concentration: Fair  Recall:  Good  Fund of Knowledge:  Fair  Language:  Good  Akathisia:  Negative  Handed:  Right  AIMS (if indicated):     Assets:  Communication Skills Desire for Improvement Physical Health Resilience  ADL's:  Intact  Cognition:  WNL  Sleep:  Number of Hours: 5.5   Treatment Plan Summary: Daily contact with patient to assess and evaluate symptoms and  progress in treatment.   Continue inpatient hospitalization.  Will continue today 10/14/2020 plan as below except where it is noted.   Mood control.  Continue Zyprexa zydis 20 mg po Q  hs.  Continue Depakote 1000 mg po Q bedtime.  Agitation/anxiety. Continue Vistaril 25 mg po tid prn.  Continue Lorazepam 1 mg po Q 6 hrs prn for CIWA > 10.  Insomnia.  Continue Doxepin 50 mg po Q hs prn.  Agitation protocols.  Continue Zyprexa zydis 10 mg po Q 8 hrs prn &  Lorazepam 1 mg po Q 6 hours prn &  Geodon 20 mg IM Q 12 hours prn.  Other prn medications.  Continue acetaminophen 650 mg po Q 6 hours prn for pain/fever. Continue Mylanta 30 ml po Q 4 hours prn for indigestion.  Continue MOM 30 ml po Q daily prn for constipation.  Continue nicotine patch topically Q 24 hours prn.  Other medical issues. Continue Gabitril 4 mg po Q hs for seizures.  Continue Valtrex 1,000 mg po tid for herpes infection. Continue hydrocortisone cream bid topically for rash. Continue Nicotine patch 21 mg topically for nicotine withdrawal symptoms.  Encourage group participation. Discharge disposition plan ongoing.  Armandina Stammer, NP, pmhnp, fnp-bc 10/14/2020, 1:29 PMPatient ID: Brett House, male   DOB: 1986/01/06, 36 y.o.   MRN: [id number]

## 2020-10-14 NOTE — [facility] Group Notes (Signed)
Adult Psychoeducational Group Note  Date:  10/14/2020 Time:  8:29 PM  Group Topic/Focus:  Healthy Communication:   The focus of this group is to discuss communication, barriers to communication, as well as healthy ways to communicate with others.  Participation Level:  Active  Participation Quality:  Appropriate  Affect:  Appropriate  Cognitive:  Alert and Appropriate  Insight: Good  Engagement in Group:  Engaged  Modes of Intervention:  Discussion  Additional Comments  Jacalyn Lefevre 10/14/2020, 8:29 PM

## 2020-10-14 NOTE — Progress Notes (Addendum)
   10/14/20 1600  Psych Admission Type (Psych Patients Only)  Admission Status Voluntary  Psychosocial Assessment  Patient Complaints Anxiety  Eye Contact Brief  Facial Expression Flat  Affect Appropriate to circumstance  Speech Logical/coherent  Interaction Assertive  Motor Activity Other (Comment) (WDL)  Appearance/Hygiene Unremarkable  Behavior Characteristics Cooperative;Appropriate to situation  Mood Anxious;Pleasant  Thought Process  Coherency WDL  Content WDL  Delusions None reported or observed  Perception WDL  Hallucination None reported or observed  Judgment Impaired  Confusion None  Danger to Self  Current suicidal ideation? Denies  Self-Injurious Behavior No self-injurious ideation or behavior indicators observed or expressed   Agreement Not to Harm Self No  Description of Agreement contracts in hospital  Danger to Others  Danger to Others None reported or observed   D. Pt presents with an sad affect/ anxious mood-rated his depression, hopelessness and anxiety a 5/4/8, respectively. Pt reported that his goal today was "to just make it through the day". Pt reported that he was worried about whether or not he will be accepted into a treatment program. Pt has been visible in the milieu- minimal interaction with peers. Pt denied SI/HI and A/VH.  A. Labs and vitals monitored. Pt given and educated on medications. Pt supported emotionally and encouraged to express concerns and ask questions.   R. Pt remains safe with 15 minute checks. Will continue POC.

## 2020-10-14 NOTE — [facility] Group Notes (Signed)
[facility] LCSW Group Therapy Note  10/14/2020    Type of Therapy and Topic:  Group Therapy:  Adding Supports Including Yourself  Participation Level:  Active   Description of Group:   Patients in this group were introduced to the concept that additional supports including self-support are an essential part of recovery.  Patients listed their current healthy and unhealthy supports, and discussed the difference between the two.   Several songs were played and a group discussion ensued in which patients stated they could relate to the songs which inspired them to realize they have be willing to help themselves in order to succeed, because other people cannot achieve sobriety or stability for them.  Parents were encouraged toward self-advocacy and self-support as part of their recovery.  They discussed their reactions to these songs' messages, which were positive and hopeful.  Before group ended, they identified the supports they believe they need to add to their lives to achieve their goals at discharge.   Therapeutic Goals: 1)  explain the difference between healthy and unhealthy supports and discuss what specific supports are currently in patients' lives 2)  demonstrate the importance of being a key part of one's own support system 3)  discuss the need for appropriate boundaries with supports 4)  elicit ideas from patients about supports that need to be added in order to achieve goals   Summary of Patient Progress:   The patient expressed that supports he needs to add at discharge include himself and professional supports, specifically either [facility] or [facility].  This will lead to him being able (in the long-term) to have his family's support.    Therapeutic Modalities:   Motivational Interviewing Activity  Lynnell Chad

## 2020-10-15 DIAGNOSIS — F314 Bipolar disorder, current episode depressed, severe, without psychotic features: Secondary | ICD-10-CM | POA: Diagnosis not present

## 2020-10-15 MED ORDER — DIVALPROEX SODIUM ER 500 MG PO TB24: 1000.0000 mg | ORAL_TABLET | Freq: Every day | ORAL | 0 refills | Status: DC

## 2020-10-15 MED ORDER — ENSURE ENLIVE PO LIQD
237.0000 mL | ORAL | Status: DC
  Administered 2020-10-15: 237 mL via ORAL
  Filled 2020-10-15 (×2): qty 237

## 2020-10-15 MED ORDER — NICOTINE POLACRILEX 2 MG MT GUM
2.0000 mg | CHEWING_GUM | OROMUCOSAL | Status: DC | PRN
  Administered 2020-10-15 (×3): 2 mg via ORAL
  Filled 2020-10-15: qty 1

## 2020-10-15 MED ORDER — TIAGABINE HCL 4 MG PO TABS: 8.0000 mg | ORAL_TABLET | Freq: Every day | ORAL | 0 refills | Status: DC

## 2020-10-15 MED ORDER — NICOTINE 21 MG/24HR TD PT24: 21.0000 mg | MEDICATED_PATCH | Freq: Every day | TRANSDERMAL | 0 refills | Status: DC

## 2020-10-15 MED ORDER — NICOTINE 21 MG/24HR TD PT24
21.0000 mg | MEDICATED_PATCH | Freq: Every day | TRANSDERMAL | Status: DC
  Administered 2020-10-16: 21 mg via TRANSDERMAL
  Filled 2020-10-15 (×2): qty 1

## 2020-10-15 MED ORDER — DOXEPIN HCL 75 MG PO CAPS: 75.0000 mg | ORAL_CAPSULE | Freq: Every evening | ORAL | 0 refills | Status: DC | PRN

## 2020-10-15 MED ORDER — OLANZAPINE 20 MG PO TBDP: 20.0000 mg | ORAL_TABLET | Freq: Every day | ORAL | 0 refills | Status: DC

## 2020-10-15 NOTE — [facility] Group Notes (Signed)
[facility] LCSW Group Therapy  10/15/2020 1:53 PM  Type of Therapy:  Group Therapy: All About Me  Participation Level:  Active  Summary of Progress/Problems: Pt was attentive during group and appropriate. Pt completed his worksheet.   Brett House 10/15/2020, 1:53 PM

## 2020-10-15 NOTE — Progress Notes (Signed)
  [facility] Adult Case Management Discharge Plan :  Will you be returning to the same living situation after discharge:  No. [facility] At discharge, do you have transportation home?: Yes,  via [facility] Do you have the ability to pay for your medications: No.  Release of information consent forms completed and in the chart;  Patient's signature needed at discharge.  Patient to Follow up at:  Follow-up Information     Services, [facility]. Call.   Why: Referral has been made for residential substance use treatment services. Contact information: [address] [place] [address] [phone]         [facility]. Go to.   Specialty: Behavioral Health Why: Please go to this provider for therapy and medication management services during walk in hours:  Monday through Friday, from 7:45 am to 11:00 am.  Services are provided on a first come, first served basis. Contact information: [address] [place] [address] [phone]        [facility] Follow up.   Why: Please contact this provider for residential substance use therapy and medication management services. Contact information: [address] [place], [place] [address] Hours:  Open  Closes 5PM Phone: [phone]                Next level of care provider has access to [facility] Link:yes  Safety Planning and Suicide Prevention discussed: Yes,  w/ pt     Has patient been referred to the Quitline?: Patient refused referral  Patient has been referred for addiction treatment: Yes  Felizardo Hoffmann, LCSWA 10/15/2020, 3:19 PM

## 2020-10-15 NOTE — Progress Notes (Addendum)
[facility]  10/15/2020 12:11 PM Brett House  MRN:  [id number]  Subjective: Brett House reports, "I'm just feeling anxious about whether I will be able to get into [facility] the [facility] or [facility]. I'm trying to keep calm. II have been a little agitated for the past couple days because I am worrying about getting into treatment.  I heard whispers yesterday but none today so far. I slept fairly last night".  Objective: Patient is a 35 year old male with a reported past psychiatric history significant for bipolar disorder who [facility] on [facility]/2020 with auditory and visual hallucinations as well as suicidal and homicidal ideation.  The patient had had a psychiatric hospitalization in our facility in November 2021.  He admitted at that time that he had previous psychiatric hospitalizations in [place] as well as [facility] and 1 previously at old [facility].  He had also reported that he had had 2 previous suicide attempts in 2013 when he had hung himself with a belt and then again in 2017 stepped out in front of traffic. Daily notes: Brett House is seen, chart reviewed. The chart findings were discussed with the treatment team. He presents alert, oriented & aware of situation. He is visible on the unit, attending group sessions. He reports feeling very anxious & says he is worried about his chances of getting into a treatment program at the [facility] or [facility]. He was provided with the intake form and phone number for [facility] and instructed to call them to check on bed status. He stated "I am anxious about what will happen if I don't get into treatment, I can't go back to dong what I have been doing." He denies It appears patient's major problems at hand is homelessness. He says the two places or facilities he is trying to get into will at least guaranteed him 30 days of a roof  over his head while learning to stay clean from drugs & alcohol. He says he slept fairly last night.  Per documentation, he slept for about 6.5 hours. He is taking & tolerating his treatment regimen. He denies suicidal thoughts, intent or plan today. He stated he heard whispers yesterday but has not heard them today. He stated "I will let the nurse know if I hear them today." He denies any HI, VH, delusional thoughts or paranoia. He denies current withdrawal symptoms. There are no changes made on his current plan of care. Will continue as already in progress. His vital signs remain stable.   Principal Problem: Bipolar 1 disorder, depressed, severe (HCC)  Diagnosis: Principal Problem:   Bipolar 1 disorder, depressed, severe (HCC) Active Problems:   Bipolar disorder (HCC)  Total Time spent with patient: 15 minutes  Past Psychiatric History: See H&P  Past Medical History:  Past Medical History:  Diagnosis Date   Anxiety    Asthma    Bipolar disorder (HCC)    Depression    History reviewed. No pertinent surgical history.  Family History: History reviewed. No pertinent family history.  Family Psychiatric  History: See H&P  Social History:  Social History   Substance and Sexual Activity  Alcohol Use Yes   Comment: 6 pack once a week for last 8-9 months     Social History   Substance and Sexual Activity  Drug Use Yes   Types: Marijuana   Comment: rarely    Social History   Socioeconomic  History   Marital status: Single    Spouse name: Not on file   Number of children: 0   Years of education: Not on file   Highest education level: 12th grade  Occupational History   Occupation: Unemployed  Tobacco Use   Smoking status: Every Day    Packs/day: 1.50    Years: 15.00    Pack years: 22.50    Types: Cigarettes   Smokeless tobacco: Current  Vaping Use   Vaping Use: Never used  Substance and Sexual Activity   Alcohol use: Yes    Comment: 6 pack once a week for last 8-9  months   Drug use: Yes    Types: Marijuana    Comment: rarely   Sexual activity: Yes  Other Topics Concern   Not on file  Social History Narrative   Not on file   Social Determinants of Health   Financial Resource Strain: Not on file  Food Insecurity: Not on file  Transportation Needs: Not on file  Physical Activity: Not on file  Stress: Not on file  Social Connections: Not on file   Additional Social History:   Sleep: Fair  Appetite:  Good  Current Medications: Current Facility-Administered Medications  Medication Dose Route Frequency Provider Last Rate Last Admin   acetaminophen (TYLENOL) tablet 650 mg  650 mg Oral Q6H PRN Antonieta Pert, MD   650 mg at 10/14/20 0807   alum & mag hydroxide-simeth (MAALOX/MYLANTA) 200-200-20 MG/5ML suspension 30 mL  30 mL Oral Q4H PRN Antonieta Pert, MD       divalproex (DEPAKOTE ER) 24 hr tablet 1,000 mg  1,000 mg Oral QHS Antonieta Pert, MD   1,000 mg at 10/14/20 2142   doxepin (SINEQUAN) capsule 75 mg  75 mg Oral QHS PRN Antonieta Pert, MD       hydrocortisone cream 1 %   Topical BID Antonieta Pert, MD   1 application at 10/14/20 1736   hydrOXYzine (ATARAX/VISTARIL) tablet 25 mg  25 mg Oral TID PRN Antonieta Pert, MD   25 mg at 10/15/20 0801   OLANZapine zydis (ZYPREXA) disintegrating tablet 10 mg  10 mg Oral Q8H PRN Antonieta Pert, MD   10 mg at 10/14/20 1735   And   LORazepam (ATIVAN) tablet 1 mg  1 mg Oral Q6H PRN Antonieta Pert, MD       And   ziprasidone (GEODON) injection 20 mg  20 mg Intramuscular Q12H PRN Antonieta Pert, MD       LORazepam (ATIVAN) tablet 1 mg  1 mg Oral Q6H PRN Antonieta Pert, MD       magnesium hydroxide (MILK OF MAGNESIA) suspension 30 mL  30 mL Oral Daily PRN Antonieta Pert, MD       nicotine (NICODERM CQ - dosed in mg/24 hours) patch 21 mg  21 mg Transdermal Daily Antonieta Pert, MD   21 mg at 10/15/20 0757   OLANZapine zydis (ZYPREXA) disintegrating tablet 20  mg  20 mg Oral QHS Antonieta Pert, MD   20 mg at 10/14/20 2142   tiaGABine (GABITRIL) tablet 8 mg  8 mg Oral QHS Antonieta Pert, MD   8 mg at 10/14/20 2142   valACYclovir (VALTREX) tablet 1,000 mg  1,000 mg Oral TID Antonieta Pert, MD   1,000 mg at 10/15/20 0757   Lab Results:  No results found for this or any previous visit (from the past 48  hour(s)).  Blood Alcohol level:  Lab Results  Component Value Date   ETH <10 10/11/2020   ETH <10 10/06/2020   Metabolic Disorder Labs: Lab Results  Component Value Date   HGBA1C 5.2 09/27/2020   MPG 103 09/27/2020   MPG 102.54 01/24/2020   No results found for: PROLACTIN Lab Results  Component Value Date   CHOL 161 09/27/2020   TRIG 156 (H) 09/27/2020   HDL 52 09/27/2020   CHOLHDL 3.1 09/27/2020   VLDL 31 09/27/2020   LDLCALC 78 09/27/2020   LDLCALC 120 (H) 01/24/2020   Physical Findings: AIMS:  , ,  ,  ,    CIWA:    COWS:     Musculoskeletal: Strength & Muscle Tone: within normal limits Gait & Station:  remained in bed on exam Patient leans: N/A  Psychiatric Specialty Exam: Physical Exam Vitals and nursing note reviewed.  HENT:     Head: Normocephalic and atraumatic.     Nose: Nose normal.     Mouth/Throat:     Pharynx: Oropharynx is clear.  Eyes:     Pupils: Pupils are equal, round, and reactive to light.  Cardiovascular:     Rate and Rhythm: Normal rate.  Pulmonary:     Effort: Pulmonary effort is normal.  Genitourinary:    Comments: Deferred Musculoskeletal:        General: Normal range of motion.     Cervical back: Normal range of motion.  Skin:    General: Skin is warm and dry.  Neurological:     General: No focal deficit present.     Mental Status: He is alert and oriented to person, place, and time.  Psychiatric:        Attention and Perception: Attention normal. He does not perceive auditory or visual hallucinations.        Mood and Affect: Mood is anxious and depressed.        Speech:  Speech normal.        Behavior: Behavior normal. Behavior is cooperative.        Thought Content: Thought content is not paranoid or delusional. Thought content does not include homicidal or suicidal ideation. Thought content does not include homicidal or suicidal plan.        Cognition and Memory: Cognition normal.     Comments:      Review of Systems  Constitutional: Negative.  Negative for chills, diaphoresis and fever.  HENT: Negative.  Negative for congestion, sneezing and sore throat.   Eyes: Negative.   Respiratory: Negative.  Negative for apnea.   Cardiovascular:  Negative for chest pain and palpitations.  Gastrointestinal:  Negative for abdominal distention, abdominal pain, constipation, diarrhea, nausea and vomiting.  Genitourinary:  Negative for difficulty urinating.  Musculoskeletal: Negative.   Skin: Negative.   Allergic/Immunologic: Negative for environmental allergies, food allergies and immunocompromised state.       Allergies: E-mycin.  Neurological:  Negative for dizziness, tremors, seizures, syncope, facial asymmetry, speech difficulty, weakness, light-headedness, numbness and headaches.  Psychiatric/Behavioral:  Positive for dysphoric mood, hallucinations (denies today, hears whispers occasionally) and sleep disturbance. Negative for agitation (Restlessness), behavioral problems, confusion, decreased concentration, self-injury and suicidal ideas. The patient is nervous/anxious. The patient is not hyperactive.    Blood pressure 102/63, pulse 87, temperature 98 F (36.7 C), temperature source Oral, resp. rate 18, height 5\' 11"  (1.803 m), weight 71.2 kg, SpO2 99 %.Body mass index is 21.9 kg/m.  General Appearance: Casual  Eye Contact:  Fair  Speech:  Clear and Coherent  Volume:  Normal  Mood:  Anxious, depressed  Affect:  Flat  Thought Process:  Coherent and Goal Directed  Orientation:  Full (Time, Place, and Person)  Thought Content:  Logical  Suicidal Thoughts:    Denies any thoughts, plans or intent.  Homicidal Thoughts:  No  Memory:  Negative NA Immediate;   Good Recent;   Good Remote;   Good  Judgement:  Fair  Insight:  Fair  Psychomotor Activity:  Normal  Concentration:  Concentration: Fair  Recall:  Good  Fund of Knowledge:  Fair  Language:  Good  Akathisia:  Negative  Handed:  Right  AIMS (if indicated):     Assets:  Communication Skills Desire for Improvement Physical Health Resilience  ADL's:  Intact  Cognition:  WNL  Sleep:  Number of Hours: 6.5   Treatment Plan Summary: Daily contact with patient to assess and evaluate symptoms and progress in treatment.   Continue inpatient hospitalization.  Will continue today 10/15/2020 plan as below except where it is noted.   Mood control.  Continue Zyprexa zydis 20 mg po Q hs.  Continue Depakote 1000 mg po Q bedtime.  Agitation/anxiety. Continue Vistaril 25 mg po tid prn.  Continue Lorazepam 1 mg po Q 6 hrs prn for CIWA > 10.  Insomnia.  Continue Doxepin 50 mg po Q hs prn.  Agitation protocols.  Continue Zyprexa zydis 10 mg po Q 8 hrs prn &  Lorazepam 1 mg po Q 6 hours prn &  Geodon 20 mg IM Q 12 hours prn.  Other prn medications.  Continue acetaminophen 650 mg po Q 6 hours prn for pain/fever. Continue Mylanta 30 ml po Q 4 hours prn for indigestion.  Continue MOM 30 ml po Q daily prn for constipation.  Continue nicotine patch topically Q 24 hours prn.  Other medical issues. Continue Gabitril 4 mg po Q hs for seizures.  Continue Valtrex 1,000 mg po tid for herpes infection. Continue hydrocortisone cream bid topically for rash. Continue Nicotine patch 21 mg topically for nicotine withdrawal symptoms.  Encourage group participation. Discharge disposition plan ongoing. Patient to fill out [facility] intake form and return to CSW, and call for bed availability.   Laveda Abbe, NP, PMHNP-BC 10/15/2020, 12:11 PM

## 2020-10-15 NOTE — Plan of Care (Signed)
Nurse discussed coping skills with patient.  

## 2020-10-15 NOTE — Progress Notes (Signed)
Spiritual care group on grief and loss facilitated by chaplain Akiba Melfi MDiv, B[facility]  Group Goal:  Support / Education around grief and loss Members engage in facilitated group support and psycho-social education.  Group Description:  Following introductions and group rules, group members engaged in facilitated group dialog and support around topic of loss, with particular support around experiences of loss in their lives. Group Identified types of loss (relationships / self / things) and identified patterns, circumstances, and changes that precipitate losses. Reflected on thoughts / feelings around loss, normalized grief responses, and recognized variety in grief experience.   Group noted Worden's four tasks of grief in discussion.  Group drew on Adlerian / Rogerian, narrative, MI, Patient Progress:  Present throughout group.  

## 2020-10-15 NOTE — Tx Team (Signed)
Interdisciplinary Treatment and Diagnostic Plan Update  10/15/2020 Time of Session: 9:05am Brett House MRN: [id number]  Principal Diagnosis: Bipolar 1 disorder, depressed, severe ([place])  Secondary Diagnoses: Principal Problem:   Bipolar 1 disorder, depressed, severe ([place]) Active Problems:   Bipolar disorder ([place])   Current Medications:  Current Facility-Administered Medications  Medication Dose Route Frequency Provider Last Rate Last Admin   acetaminophen (TYLENOL) tablet 650 mg  650 mg Oral Q6H PRN Sharma Covert, MD   650 mg at 10/14/20 0807   alum & mag hydroxide-simeth (MAALOX/MYLANTA) 200-200-20 MG/5ML suspension 30 mL  30 mL Oral Q4H PRN Sharma Covert, MD       divalproex (DEPAKOTE ER) 24 hr tablet 1,000 mg  1,000 mg Oral QHS Sharma Covert, MD   1,000 mg at 10/14/20 2142   doxepin (SINEQUAN) capsule 75 mg  75 mg Oral QHS PRN Sharma Covert, MD       hydrocortisone cream 1 %   Topical BID Sharma Covert, MD   1 application at 83/41/96 1736   hydrOXYzine (ATARAX/VISTARIL) tablet 25 mg  25 mg Oral TID PRN Sharma Covert, MD   25 mg at 10/15/20 0801   OLANZapine zydis (ZYPREXA) disintegrating tablet 10 mg  10 mg Oral Q8H PRN Sharma Covert, MD   10 mg at 10/14/20 1735   And   LORazepam (ATIVAN) tablet 1 mg  1 mg Oral Q6H PRN Sharma Covert, MD       And   ziprasidone (GEODON) injection 20 mg  20 mg Intramuscular Q12H PRN Sharma Covert, MD       LORazepam (ATIVAN) tablet 1 mg  1 mg Oral Q6H PRN Sharma Covert, MD       magnesium hydroxide (MILK OF MAGNESIA) suspension 30 mL  30 mL Oral Daily PRN Sharma Covert, MD       nicotine (NICODERM CQ - dosed in mg/24 hours) patch 21 mg  21 mg Transdermal Daily Sharma Covert, MD   21 mg at 10/15/20 0757   OLANZapine zydis (ZYPREXA) disintegrating tablet 20 mg  20 mg Oral QHS Sharma Covert, MD   20 mg at 10/14/20 2142   tiaGABine (GABITRIL) tablet 8 mg  8 mg Oral QHS Sharma Covert, MD    8 mg at 10/14/20 2142   valACYclovir (VALTREX) tablet 1,000 mg  1,000 mg Oral TID Sharma Covert, MD   1,000 mg at 10/15/20 0757   PTA Medications: Medications Prior to Admission  Medication Sig Dispense Refill Last Dose   divalproex (DEPAKOTE ER) 500 MG 24 hr tablet Take 2 tablets (1,000 mg total) by mouth at bedtime. (Patient not taking: Reported on 10/12/2020) 60 tablet 0    doxepin (SINEQUAN) 50 MG capsule Take 1 capsule (50 mg total) by mouth at bedtime. (Patient not taking: Reported on 10/12/2020) 30 capsule 0    nicotine polacrilex (NICORETTE) 2 MG gum Take 1 each (2 mg total) by mouth as needed for smoking cessation. You are encouraged to continue smoking cessation. (Patient not taking: Reported on 10/12/2020) 100 tablet 0    OLANZapine zydis (ZYPREXA) 20 MG disintegrating tablet Take 1 tablet (20 mg total) by mouth at bedtime. (Patient not taking: Reported on 10/12/2020) 30 tablet 0    tiaGABine (GABITRIL) 4 MG tablet Take 1 tablet (4 mg total) by mouth at bedtime. (Patient not taking: Reported on 10/12/2020) 30 tablet 0     Patient Stressors: Financial difficulties Substance abuse  Patient Strengths: Average or above average intelligence Motivation for treatment/growth  Treatment Modalities: Medication Management, Group therapy, Case management,  1 to 1 session with clinician, Psychoeducation, Recreational therapy.   Physician Treatment Plan for Primary Diagnosis: Bipolar 1 disorder, depressed, severe ([place]) Long Term Goal(s): Improvement in symptoms so as ready for discharge   Short Term Goals: Ability to identify and develop effective coping behaviors will improve Ability to maintain clinical measurements within normal limits will improve Compliance with prescribed medications will improve Ability to identify triggers associated with substance abuse/mental health issues will improve Ability to identify changes in lifestyle to reduce recurrence of condition will  improve Ability to verbalize feelings will improve Ability to disclose and discuss suicidal ideas  Medication Management: Evaluate patient's response, side effects, and tolerance of medication regimen.  Therapeutic Interventions: 1 to 1 sessions, Unit Group sessions and Medication administration.  Evaluation of Outcomes: Progressing  Physician Treatment Plan for Secondary Diagnosis: Principal Problem:   Bipolar 1 disorder, depressed, severe ([place]) Active Problems:   Bipolar disorder ([place])  Long Term Goal(s): Improvement in symptoms so as ready for discharge   Short Term Goals: Ability to identify and develop effective coping behaviors will improve Ability to maintain clinical measurements within normal limits will improve Compliance with prescribed medications will improve Ability to identify triggers associated with substance abuse/mental health issues will improve Ability to identify changes in lifestyle to reduce recurrence of condition will improve Ability to verbalize feelings will improve Ability to disclose and discuss suicidal ideas     Medication Management: Evaluate patient's response, side effects, and tolerance of medication regimen.  Therapeutic Interventions: 1 to 1 sessions, Unit Group sessions and Medication administration.  Evaluation of Outcomes: Progressing   RN Treatment Plan for Primary Diagnosis: Bipolar 1 disorder, depressed, severe ([place]) Long Term Goal(s): Knowledge of disease and therapeutic regimen to maintain health will improve  Short Term Goals: Ability to remain free from injury will improve, Ability to verbalize frustration and anger appropriately will improve, Ability to verbalize feelings will improve, Ability to identify and develop effective coping behaviors will improve, and Compliance with prescribed medications will improve  Medication Management: RN will administer medications as ordered by provider, will assess and evaluate patient's  response and provide education to patient for prescribed medication. RN will report any adverse and/or side effects to prescribing provider.  Therapeutic Interventions: 1 on 1 counseling sessions, Psychoeducation, Medication administration, Evaluate responses to treatment, Monitor vital signs and CBGs as ordered, Perform/monitor CIWA, COWS, AIMS and Fall Risk screenings as ordered, Perform wound care treatments as ordered.  Evaluation of Outcomes: Not Met   LCSW Treatment Plan for Primary Diagnosis: Bipolar 1 disorder, depressed, severe ([place]) Long Term Goal(s): Safe transition to appropriate next level of care at discharge, Engage patient in therapeutic group addressing interpersonal concerns.  Short Term Goals: Engage patient in aftercare planning with referrals and resources, Increase social support, Facilitate acceptance of mental health diagnosis and concerns, Identify triggers associated with mental health/substance abuse issues, and Increase skills for wellness and recovery  Therapeutic Interventions: Assess for all discharge needs, 1 to 1 time with Social worker, Explore available resources and support systems, Assess for adequacy in community support network, Educate family and significant other(s) on suicide prevention, Complete Psychosocial Assessment, Interpersonal group therapy.  Evaluation of Outcomes: Progressing   Progress in Treatment: Attending groups: Yes. Participating in groups: Yes. Taking medication as prescribed: Yes. Toleration medication: Yes. Family/Significant other contact made: No, will contact:  declined consents  Patient  understands diagnosis: Yes. Discussing patient identified problems/goals with staff: Yes. Medical problems stabilized or resolved: Yes. Denies suicidal/homicidal ideation: Yes. Issues/concerns per patient self-inventory: No.   New problem(s) identified: No, Describe:  none  New Short Term/Long Term Goal(s): detox, medication management  for mood stabilization; elimination of SI thoughts; development of comprehensive mental wellness/sobriety plan   Patient Goals:  "[facility] to [facility]"  Discharge Plan or Barriers: Pt would like to get into further treatment at discharge.  Reason for Continuation of Hospitalization: Depression Homicidal ideation Medication stabilization Suicidal ideation  Estimated Length of Stay: 3-5 days  Attendees: Patient: Brett House 10/15/2020   Physician: Fatima Sanger, DO 10/15/2020   Nursing:  10/15/2020   RN Care Manager: 10/15/2020   Social Worker: Darletta Moll, LCSW 10/15/2020  Recreational Therapist:  10/15/2020   Other:  10/15/2020   Other:  10/15/2020   Other: 10/15/2020     Scribe for Treatment Team: Vassie Moselle, LCSW 10/15/2020 10:20 AM

## 2020-10-15 NOTE — Progress Notes (Signed)
Adult Psychoeducational Group Note  Date:  10/15/2020 Time:  0934  Group Topic/Focus:  Developing a Wellness Toolbox:   The focus of this group is to help patients develop a "wellness toolbox" with skills and strategies to promote recovery upon discharge.  Participation Level:  Active  Participation Quality:  Appropriate  Affect:  Appropriate  Cognitive:  Appropriate  Insight: Appropriate  Engagement in Group:  Engaged  Modes of Intervention:  Discussion and Education  Additional Comments:  Pt attended group and stated that his goal was to get back to feeling like himself.  Marlyne Totaro E 10/15/2020, 2:10 PM

## 2020-10-15 NOTE — Progress Notes (Signed)
The patient attended the evening A.A.meeting and was appropriate.  

## 2020-10-15 NOTE — BHH Counselor (Signed)
CSW provided pt with a homeless packet including resources such as shelter resources, food resources, Good RX card and various other resources.   Fredirick Lathe, LCSWA Clinicial Social Worker [employer]

## 2020-10-15 NOTE — B[facility] Group Notes (Signed)
Occupational Therapy Group Note Date: 10/15/2020 Group Topic/Focus: Sleep Hygiene  Group Description: Group encouraged increased engagement and participation through group discussion focused on Sleep Hygiene. Patients were encouraged to share their own concerns and difficulties with getting a healthy night's rest. Patients were then provided education and resources on habits/routines to maintain healthy sleep hygiene.  Therapeutic Goal(s): Identify and share any current issues with sleep routine and overall sleep hygiene Identify strategies, both environmental and lifestyle, that could help to improve overall sleep quality Participation Level: Active   Participation Quality: Independent   Behavior: Calm and Cooperative   Speech/Thought Process: Focused   Affect/Mood: Euthymic   Insight: Fair   Judgement: Fair   Individualization: Brett House was active in their participation of group discussion/activity. Pt identified sleeping well, as he was recently put on a sleep medication that he has found helpful. Appeared receptive to additional strategies/education provided.   Modes of Intervention: Discussion and Education  Patient Response to Interventions:  Attentive, Engaged, and Receptive   Plan: Continue to engage patient in OT groups 2 - 3x/week.  10/15/2020  Donne Hazel, MOT, OTR/L

## 2020-10-15 NOTE — BHH Counselor (Signed)
On the morning of 8/16 Pt's Nurse will need to call and have pt transported to [facility] in [place] at 8am via [employer] [phone]. Please make sure pt has his medications with him prior to discharge.

## 2020-10-15 NOTE — Progress Notes (Signed)
   10/15/20 2108  Psych Admission Type (Psych Patients Only)  Admission Status Voluntary  Psychosocial Assessment  Patient Complaints Anxiety  Eye Contact Brief  Facial Expression Flat  Affect Appropriate to circumstance  Speech Logical/coherent  Interaction Assertive  Motor Activity Other (Comment) (WDL)  Appearance/Hygiene Unremarkable  Behavior Characteristics Cooperative;Appropriate to situation  Mood Pleasant;Anxious  Thought Process  Coherency WDL  Content WDL  Delusions None reported or observed  Perception WDL  Hallucination None reported or observed  Judgment Impaired  Confusion None  Danger to Self  Current suicidal ideation? Denies  Self-Injurious Behavior No self-injurious ideation or behavior indicators observed or expressed   Agreement Not to Harm Self No  Description of Agreement Verbal contract  Danger to Others  Danger to Others None reported or observed

## 2020-10-15 NOTE — BHH Counselor (Signed)
Pt has been accepted to [facility] for an admission interview 10/16/20 at 9am.   CSW sent referral to [facility] so pt can follow-up for their services post-discharge to [facility].   Fredirick Lathe, LCSWA Clinicial Social Worker [employer]

## 2020-10-15 NOTE — Progress Notes (Deleted)
Psychoeducational Group Note  Date:  10/15/2020 Time:  2117  Group Topic/Focus:  Wrap-Up Group:   The focus of this group is to help patients review their daily goal of treatment and discuss progress on daily workbooks.  Participation Level: Did Not Attend  Participation Quality:  Not Applicable  Affect:  Not Applicable  Cognitive:  Not Applicable  Insight:  Not Applicable  Engagement in Group: Not Applicable  Additional Comments:  The patient did not attend group this evening.   Hazle Coca S 10/15/2020, 9:17 PM

## 2020-10-15 NOTE — BHH Suicide Risk Assessment (Signed)
[facility] Discharge Suicide Risk Assessment   Principal Problem: Bipolar 1 disorder, depressed, severe (HCC) Discharge Diagnoses: Principal Problem:   Bipolar 1 disorder, depressed, severe (HCC) Active Problems:   Bipolar disorder (HCC)   Total Time spent with patient: 15 minutes  Musculoskeletal: Strength & Muscle Tone: within normal limits Gait & Station: normal Patient leans: N/A  Psychiatric Specialty Exam  Presentation  General Appearance: Appropriate for Environment; Casual; Fairly Groomed  Eye Contact:Good  Speech:Clear and Coherent; Normal Rate  Speech Volume:Normal  Handedness:Right   Mood and Affect  Mood:Anxious; Depressed  Duration of Depression Symptoms: Greater than two weeks  Affect:Congruent; Depressed   Thought Process  Thought Processes:Coherent; Goal Directed; Linear  Descriptions of Associations:Intact  Orientation:Full (Time, Place and Person)  Thought Content:Logical  History of Schizophrenia/Schizoaffective disorder:No  Duration of Psychotic Symptoms:N/A  Hallucinations:Hallucinations: Auditory Description of Auditory Hallucinations: hears whispers occasionally  Ideas of Reference:None  Suicidal Thoughts:Suicidal Thoughts: No  Homicidal Thoughts:Homicidal Thoughts: No   Sensorium  Memory:Immediate Fair; Recent Fair; Remote Fair  Judgment:Fair  Insight:Fair   Executive Functions  Concentration:Fair  Attention Span:Good  Recall:Good  Fund of Knowledge:Good  Language:Good   Psychomotor Activity  Psychomotor Activity:Psychomotor Activity: Normal   Assets  Assets:Communication Skills; Desire for Improvement; Resilience   Sleep  Sleep:Number of Hours of Sleep: 6.5   Physical Exam: Physical Exam Vitals and nursing note reviewed.  Constitutional:      Appearance: Normal appearance.  HENT:     Head: Normocephalic and atraumatic.  Pulmonary:     Effort: Pulmonary effort is normal.  Neurological:      General: No focal deficit present.     Mental Status: He is alert and oriented to person, place, and time.   Review of Systems  Psychiatric/Behavioral:  The patient is nervous/anxious.   All other systems reviewed and are negative. Blood pressure 102/63, pulse 87, temperature 98 F (36.7 C), temperature source Oral, resp. rate 18, height 5\' 11"  (1.803 m), weight 71.2 kg, SpO2 99 %. Body mass index is 21.9 kg/m.  Mental Status Per Nursing Assessment::   On Admission:  NA (denies at this time)  Demographic Factors:  Male, Divorced or widowed, Caucasian, Low socioeconomic status, Living alone, and Unemployed  Loss Factors: Financial problems/change in socioeconomic status  Historical Factors: Impulsivity  Risk Reduction Factors:   NA  Continued Clinical Symptoms:  Bipolar Disorder:   Depressive phase Alcohol/Substance Abuse/Dependencies  Cognitive Features That Contribute To Risk:  None    Suicide Risk:  Minimal: No identifiable suicidal ideation.  Patients presenting with no risk factors but with morbid ruminations; may be classified as minimal risk based on the severity of the depressive symptoms   Follow-up Information     [facility]. Call.   Why: You have an admission assessment 10/16/20 at 9am. Contact information: [address] [place] [phone]         [facility]. Go to.   Specialty: Behavioral Health Why: Please go to this provider for therapy and medication management services during walk in hours:  Monday through Friday, from 7:45 am to 11:00 am.  Services are provided on a first come, first served basis. Contact information: [address] [place] [phone]        [facility] Follow up.   Why: Please contact this provider for residential substance use therapy and medication management services. Contact information: [address], [place], [place]  [place] Hours:  Open  Closes 5PM Phone: [phone]                Plan Of Care/Follow-up recommendations:  Activity:  ad lib  Antonieta Pert, MD 10/15/2020, 4:07 PM

## 2020-10-15 NOTE — Progress Notes (Signed)
NUTRITION ASSESSMENT  Pt identified as at risk on the Malnutrition Screen Tool  INTERVENTION: 1. Supplements: Ensure Plus PO daily, each provides 350 kcals and 13g protein   NUTRITION DIAGNOSIS: Unintentional weight loss related to sub-optimal intake as evidenced by pt report.   Goal: Pt to meet >/= 90% of their estimated nutrition needs.  Monitor:  PO intake  Assessment:  Pt admitted for hallucinations and SI. PMHx of bipolar disorder. Recent substance abuse (meth, EtOH). Reports good appetite.  Per weight records, pt has lost 29 lbs since 01/25/20 (15% wt loss x 8.5 months, significant for time frame). Will order Ensure daily.    Height: Ht Readings from Last 1 Encounters:  10/12/20 5\' 11"  (1.803 m)    Weight: Wt Readings from Last 1 Encounters:  10/12/20 71.2 kg    Weight Hx: Wt Readings from Last 10 Encounters:  10/12/20 71.2 kg  10/06/20 72.6 kg  09/25/20 71.2 kg  09/22/20 74.8 kg  01/25/20 84.8 kg  01/24/20 83.9 kg    BMI:  Body mass index is 21.9 kg/m. Pt meets criteria for normal based on current BMI.  Estimated Nutritional Needs: Kcal: 25-30 kcal/kg Protein: > 1 gram protein/kg Fluid: 1 ml/kcal  Diet Order:  Diet Order             Diet regular Room service appropriate? Yes; Fluid consistency: Thin  Diet effective now                  Pt is also offered choice of unit snacks mid-morning and mid-afternoon.  Pt is eating as desired.   Lab results and medications reviewed.   01/26/20, MS, RD, LDN Inpatient Clinical Dietitian Contact information available via Amion

## 2020-10-15 NOTE — Progress Notes (Signed)
Patient was given nicotine gum this afternoon.  Stated he is feeling much better today.  Patient stated he is excited about transfer to [facility] tomorrow morning.   Will discuss travel plans, etc. With staff before leaving.

## 2020-10-15 NOTE — Progress Notes (Signed)
Patient's self inventory sheet, patient sleeps good, sleep medication helpful.  Good appetite, low energy level, poor concentration.  Rated depression and hopeless 4, anxiety 8.  Denied withdrawals, checked agitation, irritability.  SI sometimes, contracts for safety.  Physical problems, rash pain, shoulder, pain #8.  Pain medicine helpful.   Will discuss [facility] with MD/SW.  Does have discharge plans.   Safety maintained with 15 minute checks.
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
## 2020-10-15 NOTE — Progress Notes (Signed)
Dayshift nurse reports AVS cannot be printed due to changes made in the discharge summary. Provider would need to reconcile AVS before discharge to [facility] at 0800 on 10/16/2020.

## 2020-10-16 NOTE — Progress Notes (Signed)
RN met with pt and reviewed pt's discharge instructions.  Pt verbalized understanding of discharge instructions and pt did not have any questions. RN reviewed and provided pt with a copy of SRA, AVS and Transition Record.  RN returned pt's belongings to pt.  Paper prescriptions and medication samples were given to pt.  Pt denied SI/HI/AVH and voiced no concerns.  Pt was appreciative of the care pt received at [facility].  Patient discharged to the lobby without incident and was going to [facility].

## 2020-10-27 ENCOUNTER — Emergency Department (HOSPITAL_COMMUNITY)
Admission: EM | Admit: 2020-10-27 | Discharge: 2020-10-28 | Disposition: A | Discharge status: Home / Self Care | Payer: Self-pay | Attending: Emergency Medicine | Admitting: Emergency Medicine

## 2020-10-27 ENCOUNTER — Encounter (HOSPITAL_COMMUNITY): Payer: Self-pay | Admitting: Emergency Medicine

## 2020-10-27 ENCOUNTER — Other Ambulatory Visit: Payer: Self-pay

## 2020-10-27 ENCOUNTER — Emergency Department (HOSPITAL_COMMUNITY): Payer: Self-pay

## 2020-10-27 DIAGNOSIS — S0990XA Unspecified injury of head, initial encounter: Secondary | ICD-10-CM | POA: Insufficient documentation

## 2020-10-27 DIAGNOSIS — S50311A Abrasion of right elbow, initial encounter: Secondary | ICD-10-CM | POA: Insufficient documentation

## 2020-10-27 DIAGNOSIS — S00412A Abrasion of left ear, initial encounter: Secondary | ICD-10-CM | POA: Insufficient documentation

## 2020-10-27 DIAGNOSIS — J45909 Unspecified asthma, uncomplicated: Secondary | ICD-10-CM | POA: Insufficient documentation

## 2020-10-27 DIAGNOSIS — M25522 Pain in left elbow: Secondary | ICD-10-CM | POA: Insufficient documentation

## 2020-10-27 DIAGNOSIS — S52045A Nondisplaced fracture of coronoid process of left ulna, initial encounter for closed fracture: Secondary | ICD-10-CM

## 2020-10-27 DIAGNOSIS — F17210 Nicotine dependence, cigarettes, uncomplicated: Secondary | ICD-10-CM | POA: Insufficient documentation

## 2020-10-27 MED ORDER — IBUPROFEN 200 MG PO TABS
600.0000 mg | ORAL_TABLET | Freq: Once | ORAL | Status: AC
  Administered 2020-10-27: 600 mg via ORAL
  Filled 2020-10-27: qty 3

## 2020-10-27 NOTE — ED Notes (Addendum)
Pt transported to xray 
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
## 2020-10-28 ENCOUNTER — Emergency Department (HOSPITAL_COMMUNITY): Payer: Self-pay

## 2020-10-28 ENCOUNTER — Other Ambulatory Visit (HOSPITAL_COMMUNITY): Payer: Self-pay

## 2020-10-28 MED ORDER — OXYCODONE-ACETAMINOPHEN 5-325 MG PO TABS: 1.0000 | ORAL_TABLET | Freq: Three times a day (TID) | ORAL | 0 refills | Status: DC | PRN

## 2020-10-28 MED ORDER — FENTANYL CITRATE PF 50 MCG/ML IJ SOSY
50.0000 ug | PREFILLED_SYRINGE | Freq: Once | INTRAMUSCULAR | Status: AC
  Administered 2020-10-28: 50 ug via INTRAVENOUS
  Filled 2020-10-28: qty 1

## 2020-10-28 MED ORDER — OXYCODONE-ACETAMINOPHEN 5-325 MG PO TABS
1.0000 | ORAL_TABLET | Freq: Once | ORAL | Status: AC
  Administered 2020-10-28: 1 via ORAL
  Filled 2020-10-28: qty 1

## 2020-10-28 MED ORDER — KETOROLAC TROMETHAMINE 30 MG/ML IJ SOLN: 30.0000 mg | Freq: Once | INTRAMUSCULAR | Status: AC

## 2020-10-28 MED ORDER — KETOROLAC TROMETHAMINE 30 MG/ML IJ SOLN
INTRAMUSCULAR | Status: AC
  Administered 2020-10-28: 30 mg via INTRAVENOUS
  Filled 2020-10-28: qty 1

## 2020-10-28 MED ORDER — ONDANSETRON HCL 4 MG/2ML IJ SOLN
4.0000 mg | Freq: Once | INTRAMUSCULAR | Status: AC
  Administered 2020-10-28: 4 mg via INTRAVENOUS
  Filled 2020-10-28: qty 2

## 2020-10-28 MED ORDER — NICOTINE 21 MG/24HR TD PT24
21.0000 mg | MEDICATED_PATCH | Freq: Once | TRANSDERMAL | Status: DC
  Administered 2020-10-28: 21 mg via TRANSDERMAL
  Filled 2020-10-28: qty 1

## 2020-10-28 NOTE — ED Notes (Signed)
Ortho tech at bedside 

## 2020-10-28 NOTE — Progress Notes (Signed)
Orthopedic Tech Progress Note Patient Details:  Randeep Biondolillo 1985-12-05 [id number]  Ortho Devices Type of Ortho Device: Arm sling, Post (long arm) splint Ortho Device/Splint Location: lue Ortho Device/Splint Interventions: Ordered, Application, Adjustment   Post Interventions Patient Tolerated: Well Instructions Provided: Care of device, Adjustment of device  Trinna Post 10/28/2020, 3:34 AM

## 2020-10-28 NOTE — ED Notes (Signed)
Ortho tech called to come place a long arm splint.
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
## 2020-10-28 NOTE — ED Notes (Addendum)
Patient transported to CT 

## 2020-10-28 NOTE — Discharge Instructions (Addendum)
Thank you for allowing me to care for you today in the Emergency Department.   The x-ray of your left elbow was concerning for a fracture.  You were placed in a splint.  Please keep this clean and dry until you are seen by the orthopedist in the office.  If you have to shower, you can cover the splint with a bag to keep it from getting wet.  You can take 600 mg of Motrin once every 6 hours with food for pain.  You can also take 650 mg of Tylenol once every 6 hours for pain or alternate this with Motrin.  For severe, uncontrollable pain, you can take 1 tablet of Percocet.  Each tablet of Percocet contains 325 mg of Tylenol.  Do not take more than 4000 mg of Tylenol from all sources in a 24-hour period.  It is important you do not drink alcohol while taking Percocet as this can cause you to be impaired.  You should not drink alcohol while taking Tylenol as this is harmful for your liver.  Call to schedule a follow-up appoint with the orthopedist.  They will want to see you in the office within 1 week.  Return to the emergency department if you develop new numbness or weakness, if your fingers turn blue, if you have any fall or injury, if you develop uncontrollable vomiting, fever, unable to walk, new numbness or weakness, or other new, concerning symptoms.

## 2020-11-13 ENCOUNTER — Encounter (HOSPITAL_COMMUNITY): Payer: Self-pay | Admitting: Emergency Medicine

## 2020-11-13 ENCOUNTER — Emergency Department (HOSPITAL_COMMUNITY)
Admission: EM | Admit: 2020-11-13 | Discharge: 2020-11-14 | Disposition: A | Discharge status: Other Acute Inpatient Hospital | Payer: Self-pay | Attending: Emergency Medicine | Admitting: Emergency Medicine

## 2020-11-13 ENCOUNTER — Other Ambulatory Visit: Payer: Self-pay

## 2020-11-13 DIAGNOSIS — F17210 Nicotine dependence, cigarettes, uncomplicated: Secondary | ICD-10-CM | POA: Insufficient documentation

## 2020-11-13 DIAGNOSIS — R45851 Suicidal ideations: Secondary | ICD-10-CM | POA: Insufficient documentation

## 2020-11-13 DIAGNOSIS — J45909 Unspecified asthma, uncomplicated: Secondary | ICD-10-CM | POA: Insufficient documentation

## 2020-11-13 DIAGNOSIS — F329 Major depressive disorder, single episode, unspecified: Secondary | ICD-10-CM | POA: Insufficient documentation

## 2020-11-13 DIAGNOSIS — Z20822 Contact with and (suspected) exposure to covid-19: Secondary | ICD-10-CM | POA: Insufficient documentation

## 2020-11-13 LAB — CBC
HCT: 46.2 % (ref 39.0–52.0)
Hemoglobin: 16 g/dL (ref 13.0–17.0)
MCH: 31.9 pg (ref 26.0–34.0)
MCHC: 34.6 g/dL (ref 30.0–36.0)
MCV: 92 fL (ref 80.0–100.0)
Platelets: 269 10*3/uL (ref 150–400)
RBC: 5.02 MIL/uL (ref 4.22–5.81)
RDW: 13.6 % (ref 11.5–15.5)
WBC: 7.1 10*3/uL (ref 4.0–10.5)
nRBC: 0 % (ref 0.0–0.2)

## 2020-11-13 LAB — COMPREHENSIVE METABOLIC PANEL
ALT: 14 U/L (ref 0–44)
AST: 21 U/L (ref 15–41)
Albumin: 4.5 g/dL (ref 3.5–5.0)
Alkaline Phosphatase: 97 U/L (ref 38–126)
Anion gap: 11 (ref 5–15)
BUN: 5 mg/dL — ABNORMAL LOW (ref 6–20)
CO2: 24 mmol/L (ref 22–32)
Calcium: 9.3 mg/dL (ref 8.9–10.3)
Chloride: 101 mmol/L (ref 98–111)
Creatinine, Ser: 0.69 mg/dL (ref 0.61–1.24)
GFR, Estimated: >60 mL/min (ref >60)
Glucose, Bld: 102 mg/dL — ABNORMAL HIGH (ref 70–99)
Potassium: 3.8 mmol/L (ref 3.5–5.1)
Sodium: 136 mmol/L (ref 135–145)
Total Bilirubin: 1.3 mg/dL — ABNORMAL HIGH (ref 0.3–1.2)
Total Protein: 7.3 g/dL (ref 6.5–8.1)

## 2020-11-13 LAB — ETHANOL: Alcohol, Ethyl (B): <10 mg/dL (ref <10)

## 2020-11-13 LAB — RAPID URINE DRUG SCREEN, HOSP PERFORMED
Amphetamines: NOT DETECTED
Barbiturates: NOT DETECTED
Benzodiazepines: NOT DETECTED
Cocaine: NOT DETECTED
Opiates: NOT DETECTED
Tetrahydrocannabinol: NOT DETECTED

## 2020-11-13 LAB — ACETAMINOPHEN LEVEL: Acetaminophen (Tylenol), Serum: <10 ug/mL — ABNORMAL LOW (ref 10–30)

## 2020-11-13 LAB — RESP PANEL BY RT-PCR (FLU A&B, COVID) ARPGX2
Influenza A by PCR: NEGATIVE
Influenza B by PCR: NEGATIVE
SARS Coronavirus 2 by RT PCR: NEGATIVE

## 2020-11-13 LAB — SALICYLATE LEVEL: Salicylate Lvl: <7 mg/dL — ABNORMAL LOW (ref 7.0–30.0)

## 2020-11-13 MED ORDER — NICOTINE 21 MG/24HR TD PT24
21.0000 mg | MEDICATED_PATCH | Freq: Once | TRANSDERMAL | Status: DC
  Administered 2020-11-13: 21 mg via TRANSDERMAL
  Filled 2020-11-13: qty 1
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
## 2020-11-13 NOTE — Progress Notes (Signed)
Pt can be transferred to [facility] while seeking bed placement.

## 2020-11-13 NOTE — ED Notes (Signed)
Pt changed into scrubs and placed in recliner.  Security called to wand

## 2020-11-13 NOTE — ED Notes (Signed)
TTS in process-Monique,RN  

## 2020-11-13 NOTE — ED Notes (Signed)
Pt's valuables with security and belongings in visitor locker #1.

## 2020-11-13 NOTE — ED Provider Notes (Signed)
Emergency Medicine Provider Triage Evaluation Note  Brett House , a 35 y.o. male  was evaluated in triage.  Pt complains of SI with plan to jump in front of a train. Denies medical complaints.  Review of Systems  Positive: si Negative: pain  Physical Exam  BP (!) 157/92 (BP Location: Right Arm)   Pulse 88   Temp 98.2 F (36.8 C) (Oral)   Resp 16   SpO2 99%  Gen:   Awake, no distress   Resp:  Normal effort  MSK:   Moves extremities without difficulty   Medical Decision Making  Medically screening exam initiated at 12:10 PM.  Appropriate orders placed.  Brett House was informed that the remainder of the evaluation will be completed by another provider, this initial triage assessment does not replace that evaluation, and the importance of remaining in the ED until their evaluation is complete.     Brett House 11/13/20 1211    Terrilee Files, MD 11/13/20 Brett House
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
## 2020-11-14 ENCOUNTER — Other Ambulatory Visit (HOSPITAL_COMMUNITY)
Admission: EM | Admit: 2020-11-14 | Discharge: 2020-11-19 | Disposition: A | Discharge status: Home / Self Care | Payer: No Payment, Other | Attending: Nurse Practitioner | Admitting: Nurse Practitioner

## 2020-11-14 DIAGNOSIS — F319 Bipolar disorder, unspecified: Secondary | ICD-10-CM | POA: Diagnosis present

## 2020-11-14 DIAGNOSIS — R45851 Suicidal ideations: Secondary | ICD-10-CM | POA: Insufficient documentation

## 2020-11-14 DIAGNOSIS — F17210 Nicotine dependence, cigarettes, uncomplicated: Secondary | ICD-10-CM | POA: Diagnosis not present

## 2020-11-14 DIAGNOSIS — F1290 Cannabis use, unspecified, uncomplicated: Secondary | ICD-10-CM | POA: Diagnosis not present

## 2020-11-14 DIAGNOSIS — Z79899 Other long term (current) drug therapy: Secondary | ICD-10-CM | POA: Insufficient documentation

## 2020-11-14 DIAGNOSIS — F419 Anxiety disorder, unspecified: Secondary | ICD-10-CM | POA: Insufficient documentation

## 2020-11-14 DIAGNOSIS — Z560 Unemployment, unspecified: Secondary | ICD-10-CM | POA: Insufficient documentation

## 2020-11-14 LAB — HEPATITIS PANEL, ACUTE
HCV Ab: NONREACTIVE
Hep A IgM: NONREACTIVE
Hep B C IgM: NONREACTIVE
Hepatitis B Surface Ag: NONREACTIVE

## 2020-11-14 LAB — HIV ANTIBODY (ROUTINE TESTING W REFLEX): HIV Screen 4th Generation wRfx: NONREACTIVE

## 2020-11-14 LAB — RPR: RPR Ser Ql: NONREACTIVE

## 2020-11-14 MED ORDER — DIVALPROEX SODIUM 250 MG PO DR TAB
250.0000 mg | DELAYED_RELEASE_TABLET | Freq: Once | ORAL | Status: AC
  Administered 2020-11-14: 250 mg via ORAL
  Filled 2020-11-14: qty 1

## 2020-11-14 MED ORDER — OLANZAPINE 5 MG PO TABS
5.0000 mg | ORAL_TABLET | Freq: Once | ORAL | Status: AC
  Administered 2020-11-14: 5 mg via ORAL
  Filled 2020-11-14 (×2): qty 1

## 2020-11-14 MED ORDER — DIVALPROEX SODIUM ER 250 MG PO TB24
1000.0000 mg | ORAL_TABLET | Freq: Every day | ORAL | Status: DC
  Administered 2020-11-14: 1000 mg via ORAL
  Filled 2020-11-14: qty 4

## 2020-11-14 MED ORDER — NICOTINE 21 MG/24HR TD PT24
21.0000 mg | MEDICATED_PATCH | Freq: Every day | TRANSDERMAL | Status: DC
  Administered 2020-11-14 – 2020-11-19 (×6): 21 mg via TRANSDERMAL
  Filled 2020-11-14 (×6): qty 1
  Filled 2020-11-14: qty 14

## 2020-11-14 MED ORDER — DOXEPIN HCL 10 MG PO CAPS: 10.0000 mg | ORAL_CAPSULE | Freq: Every evening | ORAL | Status: DC | PRN

## 2020-11-14 MED ORDER — ACETAMINOPHEN 325 MG PO TABS: 650.0000 mg | ORAL_TABLET | Freq: Four times a day (QID) | ORAL | Status: DC | PRN

## 2020-11-14 MED ORDER — ALUM & MAG HYDROXIDE-SIMETH 200-200-20 MG/5ML PO SUSP: 30.0000 mL | ORAL | Status: DC | PRN

## 2020-11-14 MED ORDER — MAGNESIUM HYDROXIDE 400 MG/5ML PO SUSP: 30.0000 mL | Freq: Every day | ORAL | Status: DC | PRN

## 2020-11-14 MED ORDER — OLANZAPINE 10 MG PO TBDP
10.0000 mg | ORAL_TABLET | Freq: Every day | ORAL | Status: DC
  Administered 2020-11-14 – 2020-11-18 (×5): 10 mg via ORAL
  Filled 2020-11-14 (×3): qty 1
  Filled 2020-11-14: qty 14
  Filled 2020-11-14 (×3): qty 1

## 2020-11-14 NOTE — Group Note (Signed)
Group Topic: Social Support  Group Date: 11/14/2020 Start Time: 1050 End Time: 1149 Facilitators: Juluis Pitch P  Department: [facility]  Number of Participants: 2  Group Focus: goals/reality orientation Treatment Modality:  Leisure Development Interventions utilized were assignment, group exercise, and leisure development Purpose: reinforce self-care  Name: Brett House Date of Birth: October 05, 1985  MR: [id number]    Level of Participation: minimal Quality of Participation: with provider Interactions with others: with provider Mood/Affect: appropriate Triggers (if applicable): n/a Cognition: coherent/clear Progress: Other Response: pt pulled by provider Plan: follow-up needed  Patients Problems:  Patient Active Problem List   Diagnosis Date Noted   Bipolar 1 disorder (HCC) 11/14/2020   Bipolar 1 disorder, depressed, severe (HCC) 10/12/2020   Suicidal thoughts    Substance induced mood disorder (HCC) 10/07/2020   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

## 2020-11-14 NOTE — Progress Notes (Signed)
Patient is calm and cooperative without distress or complaint.  He denies avh shi or plan.  He makes needs known to staff and is without complaint.  He is presently sleeping.

## 2020-11-14 NOTE — ED Provider Notes (Signed)
[facility] Admission Suicide Risk Assessment  Suicide risk assessment  Patient has following modifiable risk factors for suicide: active suicidal ideation, untreated depression, social isolation, medication noncompliance, and lack of access to outpatient mental health resources, which we are addressing by restarting patient on medication for bipolar depression and referring to inpatient rehab [facility], accepted for Friday).   Patient has following non-modifiable or demographic risk factors for suicide: male gender, history of suicide attempt, history of self harm behavior, and psychiatric hospitalization  Patient has the following protective factors against suicide: Frustration tolerance, ability to empathize with others    Total Time spent with patient: 45 minutes Principal Problem: <principal problem not specified> Diagnosis:  Active Problems:   Bipolar 1 disorder (HCC)  Subjective Data: Please see note by Gwyndolyn Kaufman (MS-III) for full interview; I was present for the entirety of his interview.  Patient endorses long history of depression (since teenager), substance use (since teenager) and manic phases (since ealry 20s). Most of his mania has been in the setting of substance use (meth/cocaine) however he endorses manic phases (of decreased intensity) during periods of sobriety as an adult (22 months, 18 months, 9 months). He currently has been clean from substances for multiple weeks and is endorsing symptoms consistent with a mixed state - depression with SI, difficulty sleeping, has lost weight, poor motivation/anhedonia, etc alongside a revved up feeling (pacing, walking all over city, etc). He is agreeable to starting olanzapine and later fluoxetine (approximating Symbiax); major concerns for medication therapy include cost and need for lab draws. Has suicide plan of high lethality (jumping in front of train) and easy access (currently camped out by train station); reportedly has jumped into  traffic and acted on similar plans in past.   Continued Clinical Symptoms:    The "Alcohol Use Disorders Identification Test", Guidelines for Use in Primary Care, Second Edition.  World Science writer [facility]). Score between 0-7:  no or low risk or alcohol related problems. Score between 8-15:  moderate risk of alcohol related problems. Score between 16-19:  high risk of alcohol related problems. Score 20 or above:  warrants further diagnostic evaluation for alcohol dependence and treatment.   CLINICAL FACTORS:   Bipolar Disorder:   Mixed State   Musculoskeletal: Strength & Muscle Tone: within normal limits Gait & Station: normal Patient leans: N/A  Psychiatric Specialty Exam:  Presentation  General Appearance: Appropriate for Environment; Casual Eye Contact:Good Speech:Clear and Coherent (occasionally rambling) Speech Volume:Normal Handedness:Right   Mood and Affect  Mood:Depressed Affect:Congruent   Thought Process  Thought Processes:Coherent; Goal Directed Descriptions of Associations:Loose Orientation:Full (Time, Place and Person) Thought Content:Rumination; Scattered (some paraonid delusions (people follow him every august)) History of Schizophrenia/Schizoaffective disorder:No Duration of Psychotic Symptoms:N/A Hallucinations:Hallucinations: Auditory Description of Auditory Hallucinations: voices whispering, nonspecific content, no CAH Ideas of Reference:None  Suicidal Thoughts:Suicidal Thoughts: Yes, Active SI Active Intent and/or Plan: With Intent; With Plan  Homicidal Thoughts:Homicidal Thoughts: No   Sensorium  Memory:Immediate Good Judgment:Poor Insight:Good   Executive Functions  Concentration:Good Attention Span:Good Recall:Good Fund of Knowledge:Good Language:Good   Psychomotor Activity  Psychomotor Activity:Psychomotor Activity: Normal   Assets  Assets:Desire for Improvement; Physical Health; Communication Skills   Sleep   Sleep:Sleep: Poor    Physical Exam: Physical Exam ROS Blood pressure 117/65, pulse 82, temperature 98.8 F (37.1 C), temperature source Oral, resp. rate 18, height 5\' 10"  (1.778 m), weight 74.8 kg, SpO2 99 %. Body mass index is 23.66 kg/m.   COGNITIVE FEATURES THAT CONTRIBUTE TO RISK:  Chronic substance use and dysfunctional reward pathway  SUICIDE RISK:   Severe:  Frequent, intense, and enduring suicidal ideation, specific plan, no subjective intent, but some objective markers of intent (i.e., choice of lethal method), the method is accessible, some limited preparatory behavior, evidence of impaired self-control, severe dysphoria/symptomatology, multiple risk factors present, and few if any protective factors, particularly a lack of social support.  PLAN OF CARE:  - continued admission to [facility] - initiation of evidence-based treatment for bipolar depression (olanzapine today, possibly fluoxetine later)  - likely discharge to inpatient rehab  I certify that inpatient services furnished can reasonably be expected to improve the patient's condition.   Ova Meegan A Kayron Hicklin 11/14/2020, 1:47 PM

## 2020-11-14 NOTE — ED Notes (Signed)
Paperwork and belongings given to [employer]. Patient walked out with them to go to [facility]

## 2020-11-14 NOTE — Progress Notes (Signed)
Pt is awake, alert and oriented. Pt did not voice any complaints of pain or discomfort. Administered scheduled meds with no incident. Pt endorses SI with plan to jump in front of a train. Pt verbally contracts for safety on the unit. Pt denies current HI/AVH. Staff will monitor for pt's safety.

## 2020-11-14 NOTE — ED Provider Notes (Addendum)
Behavioral Health Admission H&P [facility] & OBS)  Date: 11/14/20 Patient Name: Brett House MRN: [id number] Chief Complaint: No chief complaint on file.     Diagnoses:  Final diagnoses:  Bipolar I disorder (HCC)    HPI: Brett House is a 35 y.o. male who presented to [facility] due to worsening depression and SI with a plan to jump in front of a train. Patient was petitioned for IVC by the EDP. Patient was transferred to [facility] for continuous assessment. IVC was rescinded after evaluation at [facility]. Patient agrees to voluntary admission to [facility].  On evaluation patient is alert and oriented x 4, pleasant, and cooperative. Speech is clear and coherent. Mood is depressed and affect is congruent with mood. Thought process is coherent and thought content is logical. Reports that he hears a whispering voice. States that the voice is not command in nature. No indication that patient is responding to internal stimuli. No evidence of delusional thought content. Continues to report SI with plan to jump in front of a train. States that he is homeless and has been living near the [employer] station. Denies homicidal ideations. Denies use of alcohol and other substances for 2 months.   Patient reports that he is prescribed depakote, zyprexa, and doxepin. States that he has not taken medications since discharge from [facility] in August. Patient was last inpatient at [facility] 10/12/2020-10/16/2020. He was discharged on depakote, doxepin, olanzapine, and tiagabine.    PHQ 2-9:   Flowsheet Row ED from 11/13/2020 in [facility] EMERGENCY DEPARTMENT ED from 10/27/2020 in [facility] Admission (Discharged[facility]  C-SSRS RISK CATEGORY High Risk Error: Question 6 not populated Low Risk        Total Time spent with patient: 45 minutes  Musculoskeletal  Strength & Muscle Tone: within normal limits Gait & Station: normal Patient leans:  N/A  Psychiatric Specialty Exam  Presentation General Appearance: Appropriate for Environment; Fairly Groomed; Casual  Eye Contact:Good  Speech:Clear and Coherent; Normal Rate  Speech Volume:Normal  Handedness:Right   Mood and Affect  Mood:Anxious; Depressed  Affect:Congruent   Thought Process  Thought Processes:Coherent; Goal Directed; Linear  Descriptions of Associations:Intact  Orientation:Full (Time, Place and Person)  Thought Content:Logical  Diagnosis of Schizophrenia or Schizoaffective disorder in past: No   Hallucinations:Hallucinations: Auditory Description of Auditory Hallucinations: hears whispering  Ideas of Reference:None  Suicidal Thoughts:Suicidal Thoughts: Yes, Active SI Active Intent and/or Plan: With Intent; With Plan; With Means to Carry Out  Homicidal Thoughts:Homicidal Thoughts: No   Sensorium  Memory:Immediate Good; Recent Good; Remote Good  Judgment:Intact  Insight:Fair   Executive Functions  Concentration:Fair  Attention Span:Good  Recall:Good  Fund of Knowledge:Good  Language:Good   Psychomotor Activity  Psychomotor Activity:Psychomotor Activity: Normal   Assets  Assets:Communication Skills; Desire for Improvement; Physical Health   Sleep  Sleep:Sleep: Poor   Nutritional Assessment (For OBS and FBC admissions only) Has the patient had a weight loss or gain of 10 pounds or more in the last 3 months?: No Has the patient had a decrease in food intake/or appetite?: No Does the patient have dental problems?: No Does the patient have eating habits or behaviors that may be indicators of an eating disorder including binging or inducing vomiting?: No Has the patient recently lost weight without trying?: 0 Has the patient been eating poorly because of a decreased appetite?: 0 Malnutrition Screening Tool Score: 0    Physical Exam Constitutional:  General: He is not in acute distress.    Appearance: He is not  ill-appearing, toxic-appearing or diaphoretic.  HENT:     Head: Normocephalic.     Right Ear: External ear normal.     Left Ear: External ear normal.  Eyes:     Pupils: Pupils are equal, round, and reactive to light.  Cardiovascular:     Rate and Rhythm: Normal rate.  Pulmonary:     Effort: Pulmonary effort is normal. No respiratory distress.  Musculoskeletal:        General: Normal range of motion.  Skin:    General: Skin is warm and dry.  Neurological:     Mental Status: He is alert and oriented to person, place, and time.  Psychiatric:        Mood and Affect: Mood is anxious and depressed.        Speech: Speech normal.        Behavior: Behavior is cooperative.        Thought Content: Thought content is not paranoid or delusional. Thought content includes suicidal ideation. Thought content does not include homicidal ideation. Thought content includes suicidal plan.   Review of Systems  Constitutional:  Negative for chills, diaphoresis, fever, malaise/fatigue and weight loss.  HENT:  Negative for congestion.   Respiratory:  Negative for cough and shortness of breath.   Cardiovascular:  Negative for chest pain and palpitations.  Gastrointestinal:  Negative for diarrhea, nausea and vomiting.  Neurological:  Negative for dizziness and seizures.  Psychiatric/Behavioral:  Positive for depression, hallucinations and suicidal ideas. Negative for memory loss and substance abuse. The patient is nervous/anxious and has insomnia.   All other systems reviewed and are negative.  Blood pressure 137/78, pulse 69, temperature 97.8 F (36.6 C), temperature source Oral, resp. rate 20, SpO2 98 %. There is no height or weight on file to calculate BMI.  Past Psychiatric History:  Patient was last inpatient at [facility] 10/12/2020-10/16/2020. He was discharged on depakote, doxepin, olanzapine, and tiagabine.   Is the patient at risk to self? Yes  Has the patient been a risk to self in the past 6 months?  Yes .    Has the patient been a risk to self within the distant past? Yes   Is the patient a risk to others? No   Has the patient been a risk to others in the past 6 months? No   Has the patient been a risk to others within the distant past? Yes   Past Medical History:  Past Medical History:  Diagnosis Date   Anxiety    Asthma    Bipolar disorder (HCC)    Depression    No past surgical history on file.  Family History: No family history on file.  Social History:  Social History   Socioeconomic History   Marital status: Single    Spouse name: Not on file   Number of children: 0   Years of education: Not on file   Highest education level: 12th grade  Occupational History   Occupation: Unemployed  Tobacco Use   Smoking status: Every Day    Packs/day: 1.50    Years: 15.00    Pack years: 22.50    Types: Cigarettes   Smokeless tobacco: Current  Vaping Use   Vaping Use: Never used  Substance and Sexual Activity   Alcohol use: Yes    Comment: 6 pack once a week for last 8-9 months   Drug use: Yes  Types: Marijuana    Comment: rarely   Sexual activity: Yes  Other Topics Concern   Not on file  Social History Narrative   Not on file   Social Determinants of Health   Financial Resource Strain: Not on file  Food Insecurity: Not on file  Transportation Needs: Not on file  Physical Activity: Not on file  Stress: Not on file  Social Connections: Not on file  Intimate Partner Violence: Not on file    SDOH:  SDOH Screenings   Alcohol Screen: Low Risk    Last Alcohol Screening Score (AUDIT): 0  Depression (PHQ2-9): Not on file  Financial Resource Strain: Not on file  Food Insecurity: Not on file  Housing: Not on file  Physical Activity: Not on file  Social Connections: Not on file  Stress: Not on file  Tobacco Use: High Risk   Smoking Tobacco Use: Every Day   Smokeless Tobacco Use: Current  Transportation Needs: Not on file    Last Labs:  Admission on  11/13/2020, Discharged on 11/14/2020  Component Date Value Ref Range Status   Sodium 11/13/2020 136  135 - 145 mmol/L Final   Potassium 11/13/2020 3.8  3.5 - 5.1 mmol/L Final   Chloride 11/13/2020 101  98 - 111 mmol/L Final   CO2 11/13/2020 24  22 - 32 mmol/L Final   Glucose, Bld 11/13/2020 102 (A) 70 - 99 mg/dL Final   Glucose reference range applies only to samples taken after fasting for at least 8 hours.   BUN 11/13/2020 5 (A) 6 - 20 mg/dL Final   Creatinine, Ser 11/13/2020 0.69  0.61 - 1.24 mg/dL Final   Calcium 16/12/9602 9.3  8.9 - 10.3 mg/dL Final   Total Protein 54/11/8117 7.3  6.5 - 8.1 g/dL Final   Albumin 14/78/2956 4.5  3.5 - 5.0 g/dL Final   AST 21/30/8657 21  15 - 41 U/L Final   ALT 11/13/2020 14  0 - 44 U/L Final   Alkaline Phosphatase 11/13/2020 97  38 - 126 U/L Final   Total Bilirubin 11/13/2020 1.3 (A) 0.3 - 1.2 mg/dL Final   GFR, Estimated 11/13/2020 >60  >60 mL/min Final   Comment: (NOTE) Calculated using the CKD-EPI Creatinine Equation (2021)    Anion gap 11/13/2020 11  5 - 15 Final   Performed at [facility], [address], [place], [place] [address]   Alcohol, Ethyl (B) 11/13/2020 <10  <10 mg/dL Final   Comment: (NOTE) Lowest detectable limit for serum alcohol is 10 mg/dL.  For medical purposes only. Performed at [facility], [address] [place], [place] [address]    WBC 11/13/2020 7.1  4.0 - 10.5 K/uL Final   RBC 11/13/2020 5.02  4.22 - 5.81 MIL/uL Final   Hemoglobin 11/13/2020 16.0  13.0 - 17.0 g/dL Final   HCT 41/32/4401 46.2  39.0 - 52.0 % Final   MCV 11/13/2020 92.0  80.0 - 100.0 fL Final   MCH 11/13/2020 31.9  26.0 - 34.0 pg Final   MCHC 11/13/2020 34.6  30.0 - 36.0 g/dL Final   RDW 02/72/5366 13.6  11.5 - 15.5 % Final   Platelets 11/13/2020 269  150 - 400 K/uL Final   nRBC 11/13/2020 0.0  0.0 - 0.2 % Final   Performed at [facility], [address] [place], [place] [address]   Opiates 11/13/2020 NONE DETECTED  NONE  DETECTED Final   Cocaine 11/13/2020 NONE DETECTED  NONE DETECTED Final  Benzodiazepines 11/13/2020 NONE DETECTED  NONE DETECTED Final   Amphetamines 11/13/2020 NONE DETECTED  NONE DETECTED Final   Tetrahydrocannabinol 11/13/2020 NONE DETECTED  NONE DETECTED Final   Barbiturates 11/13/2020 NONE DETECTED  NONE DETECTED Final   Comment: (NOTE) DRUG SCREEN FOR MEDICAL PURPOSES ONLY.  IF CONFIRMATION IS NEEDED FOR ANY PURPOSE, NOTIFY LAB WITHIN 5 DAYS.  LOWEST DETECTABLE LIMITS FOR URINE DRUG SCREEN Drug Class                     Cutoff (ng/mL) Amphetamine and metabolites    1000 Barbiturate and metabolites    200 Benzodiazepine                 200 Tricyclics and metabolites     300 Opiates and metabolites        300 Cocaine and metabolites        300 THC                            50 Performed at [facility], [address], [place], [place] [address]    Acetaminophen (Tylenol), Serum 11/13/2020 <10 (A) 10 - 30 ug/mL Final   Comment: (NOTE) Therapeutic concentrations vary significantly. A range of 10-30 ug/mL  may be an effective concentration for many patients. However, some  are best treated at concentrations outside of this range. Acetaminophen concentrations >150 ug/mL at 4 hours after ingestion  and >50 ug/mL at 12 hours after ingestion are often associated with  toxic reactions.  Performed at [facility], [address] [place], [place] [address]    Salicylate Lvl 11/13/2020 <7.0 (A) 7.0 - 30.0 mg/dL Final   Performed at [facility], [address], [place], [place] [address]   SARS Coronavirus 2 by RT PCR 11/13/2020 NEGATIVE  NEGATIVE Final   Comment: (NOTE) SARS-CoV-2 target nucleic acids are NOT DETECTED.  The SARS-CoV-2 RNA is generally detectable in upper respiratory specimens during the acute phase of infection. The lowest concentration of SARS-CoV-2 viral copies this assay can detect is 138 copies/mL. A negative result does not preclude  SARS-Cov-2 infection and should not be used as the sole basis for treatment or other patient management decisions. A negative result may occur with  improper specimen collection/handling, submission of specimen other than nasopharyngeal swab, presence of viral mutation(s) within the areas targeted by this assay, and inadequate number of viral copies(<138 copies/mL). A negative result must be combined with clinical observations, patient history, and epidemiological information. The expected result is Negative.  Fact Sheet for Patients:  BloggerCourse.com  Fact Sheet for Healthcare Providers:  SeriousBroker.it  This test is no                          t yet approved or cleared by the [place] FDA and  has been authorized for detection and/or diagnosis of SARS-CoV-2 by FDA under an Emergency Use Authorization (EUA). This EUA will remain  in effect (meaning this test can be used) for the duration of the COVID-19 declaration under Section 564(b)(1) of the Act, 21 U.S.C.section 360bbb-3(b)(1), unless the authorization is terminated  or revoked sooner.       Influenza A by PCR 11/13/2020 NEGATIVE  NEGATIVE Final   Influenza B by PCR 11/13/2020 NEGATIVE  NEGATIVE Final   Comment: (NOTE) The Xpert Xpress SARS-CoV-2/FLU/RSV plus assay is intended as an aid in  the diagnosis of influenza from Nasopharyngeal swab specimens and should not be used as a sole basis for treatment. Nasal washings and aspirates are unacceptable for Xpert Xpress SARS-CoV-2/FLU/RSV testing.  Fact Sheet for Patients: BloggerCourse.com  Fact Sheet for Healthcare Providers: SeriousBroker.it  This test is not yet approved or cleared by the [place] FDA and has been authorized for detection and/or diagnosis of SARS-CoV-2 by FDA under an Emergency Use Authorization (EUA). This EUA will remain in effect  (meaning this test can be used) for the duration of the COVID-19 declaration under Section 564(b)(1) of the Act, 21 U.S.C. section 360bbb-3(b)(1), unless the authorization is terminated or revoked.  Performed at [facility], [address], [place], [place] [address]   Admission on 10/12/2020, Discharged on 10/16/2020  Component Date Value Ref Range Status   Valproic Acid Lvl 10/12/2020 29 (A) 50.0 - 100.0 ug/mL Final   Performed at [facility], [address] [place], [place] [address]  Admission on 10/11/2020, Discharged on 10/12/2020  Component Date Value Ref Range Status   SARS Coronavirus 2 by RT PCR 10/11/2020 NEGATIVE  NEGATIVE Final   Comment: (NOTE) SARS-CoV-2 target nucleic acids are NOT DETECTED.  The SARS-CoV-2 RNA is generally detectable in upper respiratory specimens during the acute phase of infection. The lowest concentration of SARS-CoV-2 viral copies this assay can detect is 138 copies/mL. A negative result does not preclude SARS-Cov-2 infection and should not be used as the sole basis for treatment or other patient management decisions. A negative result may occur with  improper specimen collection/handling, submission of specimen other than nasopharyngeal swab, presence of viral mutation(s) within the areas targeted by this assay, and inadequate number of viral copies(<138 copies/mL). A negative result must be combined with clinical observations, patient history, and epidemiological information. The expected result is Negative.  Fact Sheet for Patients:  BloggerCourse.com  Fact Sheet for Healthcare Providers:  SeriousBroker.it  This test is no                          t yet approved or cleared by the [place] FDA and  has been authorized for detection and/or diagnosis of SARS-CoV-2 by FDA under an Emergency Use Authorization (EUA). This EUA will remain  in effect (meaning  this test can be used) for the duration of the COVID-19 declaration under Section 564(b)(1) of the Act, 21 U.S.C.section 360bbb-3(b)(1), unless the authorization is terminated  or revoked sooner.       Influenza A by PCR 10/11/2020 NEGATIVE  NEGATIVE Final   Influenza B by PCR 10/11/2020 NEGATIVE  NEGATIVE Final   Comment: (NOTE) The Xpert Xpress SARS-CoV-2/FLU/RSV plus assay is intended as an aid in the diagnosis of influenza from Nasopharyngeal swab specimens and should not be used as a sole basis for treatment. Nasal washings and aspirates are unacceptable for Xpert Xpress SARS-CoV-2/FLU/RSV testing.  Fact Sheet for Patients: BloggerCourse.com  Fact Sheet for Healthcare Providers: SeriousBroker.it  This test is not yet approved or cleared by the [place] FDA and has been authorized for detection and/or diagnosis of SARS-CoV-2 by FDA under an Emergency Use Authorization (EUA). This EUA will remain in effect (meaning this test can be used) for the duration of the COVID-19 declaration under Section 564(b)(1) of the Act, 21 U.S.C. section 360bbb-3(b)(1), unless the authorization is terminated or revoked.  Performed at [facility], [address] [place], [place] [address]    Sodium  10/11/2020 132 (A) 135 - 145 mmol/L Final   Potassium 10/11/2020 3.5  3.5 - 5.1 mmol/L Final   Chloride 10/11/2020 99  98 - 111 mmol/L Final   CO2 10/11/2020 23  22 - 32 mmol/L Final   Glucose, Bld 10/11/2020 134 (A) 70 - 99 mg/dL Final   Glucose reference range applies only to samples taken after fasting for at least 8 hours.   BUN 10/11/2020 11  6 - 20 mg/dL Final   Creatinine, Ser 10/11/2020 0.78  0.61 - 1.24 mg/dL Final   Calcium 62/22/9798 9.0  8.9 - 10.3 mg/dL Final   Total Protein 92/01/9416 6.9  6.5 - 8.1 g/dL Final   Albumin 40/81/4481 4.2  3.5 - 5.0 g/dL Final   AST 85/63/1497 24  15 - 41 U/L Final   ALT 10/11/2020 21  0 -  44 U/L Final   Alkaline Phosphatase 10/11/2020 77  38 - 126 U/L Final   Total Bilirubin 10/11/2020 1.1  0.3 - 1.2 mg/dL Final   GFR, Estimated 10/11/2020 >60  >60 mL/min Final   Comment: (NOTE) Calculated using the CKD-EPI Creatinine Equation (2021)    Anion gap 10/11/2020 10  5 - 15 Final   Performed at [facility], [address] [place], [place] [address]   Alcohol, Ethyl (B) 10/11/2020 <10  <10 mg/dL Final   Comment: (NOTE) Lowest detectable limit for serum alcohol is 10 mg/dL.  For medical purposes only. Performed at [facility], [address] [place], [place] [address]    Opiates 10/11/2020 NONE DETECTED  NONE DETECTED Final   Cocaine 10/11/2020 NONE DETECTED  NONE DETECTED Final   Benzodiazepines 10/11/2020 NONE DETECTED  NONE DETECTED Final   Amphetamines 10/11/2020 NONE DETECTED  NONE DETECTED Final   Tetrahydrocannabinol 10/11/2020 NONE DETECTED  NONE DETECTED Final   Barbiturates 10/11/2020 NONE DETECTED  NONE DETECTED Final   Comment: (NOTE) DRUG SCREEN FOR MEDICAL PURPOSES ONLY.  IF CONFIRMATION IS NEEDED FOR ANY PURPOSE, NOTIFY LAB WITHIN 5 DAYS.  LOWEST DETECTABLE LIMITS FOR URINE DRUG SCREEN Drug Class                     Cutoff (ng/mL) Amphetamine and metabolites    1000 Barbiturate and metabolites    200 Benzodiazepine                 200 Tricyclics and metabolites     300 Opiates and metabolites        300 Cocaine and metabolites        300 THC                            50 Performed at [facility], [address], [place], [place] [address]    WBC 10/11/2020 10.3  4.0 - 10.5 K/uL Final   RBC 10/11/2020 4.48  4.22 - 5.81 MIL/uL Final   Hemoglobin 10/11/2020 13.7  13.0 - 17.0 g/dL Final   HCT 12/87/8676 39.6  39.0 - 52.0 % Final   MCV 10/11/2020 88.4  80.0 - 100.0 fL Final   MCH 10/11/2020 30.6  26.0 - 34.0 pg Final   MCHC 10/11/2020 34.6  30.0 - 36.0 g/dL Final   RDW 72/11/4707 12.6  11.5 - 15.5 % Final   Platelets  10/11/2020 362  150 - 400 K/uL Final   nRBC 10/11/2020 0.0  0.0 - 0.2 % Final   Neutrophils Relative %  10/11/2020 73  % Final   Neutro Abs 10/11/2020 7.4  1.7 - 7.7 K/uL Final   Lymphocytes Relative 10/11/2020 15  % Final   Lymphs Abs 10/11/2020 1.5  0.7 - 4.0 K/uL Final   Monocytes Relative 10/11/2020 10  % Final   Monocytes Absolute 10/11/2020 1.1 (A) 0.1 - 1.0 K/uL Final   Eosinophils Relative 10/11/2020 2  % Final   Eosinophils Absolute 10/11/2020 0.2  0.0 - 0.5 K/uL Final   Basophils Relative 10/11/2020 0  % Final   Basophils Absolute 10/11/2020 0.0  0.0 - 0.1 K/uL Final   Immature Granulocytes 10/11/2020 0  % Final   Abs Immature Granulocytes 10/11/2020 0.04  0.00 - 0.07 K/uL Final   Performed at [facility], [address], [place], [place] [address]   Salicylate Lvl 10/11/2020 <7.0 (A) 7.0 - 30.0 mg/dL Final   Performed at [facility], [address], [place], [place] [address]   Acetaminophen (Tylenol), Serum 10/11/2020 <10 (A) 10 - 30 ug/mL Final   Comment: (NOTE) Therapeutic concentrations vary significantly. A range of 10-30 ug/mL  may be an effective concentration for many patients. However, some  are best treated at concentrations outside of this range. Acetaminophen concentrations >150 ug/mL at 4 hours after ingestion  and >50 ug/mL at 12 hours after ingestion are often associated with  toxic reactions.  Performed at [facility], [address] [place], [place] [address]   Admission on 10/06/2020, Discharged on 10/07/2020  Component Date Value Ref Range Status   WBC 10/06/2020 7.1  4.0 - 10.5 K/uL Final   RBC 10/06/2020 4.45  4.22 - 5.81 MIL/uL Final   Hemoglobin 10/06/2020 13.7  13.0 - 17.0 g/dL Final   HCT 32/44/0102 40.3  39.0 - 52.0 % Final   MCV 10/06/2020 90.6  80.0 - 100.0 fL Final   MCH 10/06/2020 30.8  26.0 - 34.0 pg Final   MCHC 10/06/2020 34.0  30.0 - 36.0 g/dL Final   RDW 72/53/6644 12.7  11.5 - 15.5 % Final   Platelets  10/06/2020 286  150 - 400 K/uL Final   nRBC 10/06/2020 0.0  0.0 - 0.2 % Final   Performed at [facility], [address], [place], [place] [address]   Salicylate Lvl 10/06/2020 <7.0 (A) 7.0 - 30.0 mg/dL Final   Performed at [facility], [address], [place], [place] [address]   Acetaminophen (Tylenol), Serum 10/06/2020 <10 (A) 10 - 30 ug/mL Final   Comment: (NOTE) Therapeutic concentrations vary significantly. A range of 10-30 ug/mL  may be an effective concentration for many patients. However, some  are best treated at concentrations outside of this range. Acetaminophen concentrations >150 ug/mL at 4 hours after ingestion  and >50 ug/mL at 12 hours after ingestion are often associated with  toxic reactions.  Performed at [facility], [address], [place], [place] [address]    Sodium 10/06/2020 137  135 - 145 mmol/L Final   Potassium 10/06/2020 3.1 (A) 3.5 - 5.1 mmol/L Final   Chloride 10/06/2020 100  98 - 111 mmol/L Final   CO2 10/06/2020 25  22 - 32 mmol/L Final   Glucose, Bld 10/06/2020 104 (A) 70 - 99 mg/dL Final   Glucose reference range applies only to samples taken after fasting for at least 8 hours.   BUN 10/06/2020 12  6 - 20 mg/dL Final   Creatinine, Ser 10/06/2020 0.62  0.61 - 1.24  mg/dL Final   Calcium 12/17/5100 8.9  8.9 - 10.3 mg/dL Final   Total Protein 58/52/7782 7.5  6.5 - 8.1 g/dL Final   Albumin 42/35/3614 4.9  3.5 - 5.0 g/dL Final   AST 43/15/4008 26  15 - 41 U/L Final   ALT 10/06/2020 31  0 - 44 U/L Final   Alkaline Phosphatase 10/06/2020 60  38 - 126 U/L Final   Total Bilirubin 10/06/2020 1.3 (A) 0.3 - 1.2 mg/dL Final   GFR, Estimated 10/06/2020 >60  >60 mL/min Final   Comment: (NOTE) Calculated using the CKD-EPI Creatinine Equation (2021)    Anion gap 10/06/2020 12  5 - 15 Final   Performed at [facility], [address], [place], [place] [address]   Alcohol, Ethyl (B)  10/06/2020 <10  <10 mg/dL Final   Comment: (NOTE) Lowest detectable limit for serum alcohol is 10 mg/dL.  For medical purposes only. Performed at [facility], [address] [place], [place] [address]    SARS Coronavirus 2 by RT PCR 10/06/2020 NEGATIVE  NEGATIVE Final   Comment: (NOTE) SARS-CoV-2 target nucleic acids are NOT DETECTED.  The SARS-CoV-2 RNA is generally detectable in upper respiratory specimens during the acute phase of infection. The lowest concentration of SARS-CoV-2 viral copies this assay can detect is 138 copies/mL. A negative result does not preclude SARS-Cov-2 infection and should not be used as the sole basis for treatment or other patient management decisions. A negative result may occur with  improper specimen collection/handling, submission of specimen other than nasopharyngeal swab, presence of viral mutation(s) within the areas targeted by this assay, and inadequate number of viral copies(<138 copies/mL). A negative result must be combined with clinical observations, patient history, and epidemiological information. The expected result is Negative.  Fact Sheet for Patients:  BloggerCourse.com  Fact Sheet for Healthcare Providers:  SeriousBroker.it  This test is no                          t yet approved or cleared by the [place] FDA and  has been authorized for detection and/or diagnosis of SARS-CoV-2 by FDA under an Emergency Use Authorization (EUA). This EUA will remain  in effect (meaning this test can be used) for the duration of the COVID-19 declaration under Section 564(b)(1) of the Act, 21 U.S.C.section 360bbb-3(b)(1), unless the authorization is terminated  or revoked sooner.       Influenza A by PCR 10/06/2020 NEGATIVE  NEGATIVE Final   Influenza B by PCR 10/06/2020 NEGATIVE  NEGATIVE Final   Comment: (NOTE) The Xpert Xpress SARS-CoV-2/FLU/RSV plus assay is  intended as an aid in the diagnosis of influenza from Nasopharyngeal swab specimens and should not be used as a sole basis for treatment. Nasal washings and aspirates are unacceptable for Xpert Xpress SARS-CoV-2/FLU/RSV testing.  Fact Sheet for Patients: BloggerCourse.com  Fact Sheet for Healthcare Providers: SeriousBroker.it  This test is not yet approved or cleared by the [place] FDA and has been authorized for detection and/or diagnosis of SARS-CoV-2 by FDA under an Emergency Use Authorization (EUA). This EUA will remain in effect (meaning this test can be used) for the duration of the COVID-19 declaration under Section 564(b)(1) of the Act, 21 U.S.C. section 360bbb-3(b)(1), unless the authorization is terminated or revoked.  Performed at [facility], [address] [place], [place] [address]    Opiates 10/07/2020 NONE DETECTED  NONE DETECTED Final  Cocaine 10/07/2020 NONE DETECTED  NONE DETECTED Final   Benzodiazepines 10/07/2020 NONE DETECTED  NONE DETECTED Final   Amphetamines 10/07/2020 POSITIVE (A) NONE DETECTED Final   Tetrahydrocannabinol 10/07/2020 NONE DETECTED  NONE DETECTED Final   Barbiturates 10/07/2020 NONE DETECTED  NONE DETECTED Final   Comment: (NOTE) DRUG SCREEN FOR MEDICAL PURPOSES ONLY.  IF CONFIRMATION IS NEEDED FOR ANY PURPOSE, NOTIFY LAB WITHIN 5 DAYS.  LOWEST DETECTABLE LIMITS FOR URINE DRUG SCREEN Drug Class                     Cutoff (ng/mL) Amphetamine and metabolites    1000 Barbiturate and metabolites    200 Benzodiazepine                 200 Tricyclics and metabolites     300 Opiates and metabolites        300 Cocaine and metabolites        300 THC                            50 Performed at [facility], [address] [place], [place] [address]   Admission on 09/25/2020, Discharged on 10/01/2020  Component Date Value Ref Range Status    Cholesterol 09/27/2020 161  0 - 200 mg/dL Final   Triglycerides 60/45/4098 156 (A) <150 mg/dL Final   HDL 11/91/4782 52  >40 mg/dL Final   Total CHOL/HDL Ratio 09/27/2020 3.1  RATIO Final   VLDL 09/27/2020 31  0 - 40 mg/dL Final   LDL Cholesterol 09/27/2020 78  0 - 99 mg/dL Final   Comment:        Total Cholesterol/HDL:CHD Risk Coronary Heart Disease Risk Table                     Men   Women  1/2 Average Risk   3.4   3.3  Average Risk       5.0   4.4  2 X Average Risk   9.6   7.1  3 X Average Risk  23.4   11.0        Use the calculated Patient Ratio above and the CHD Risk Table to determine the patient's CHD Risk.        ATP III CLASSIFICATION (LDL):  <100     mg/dL   Optimal  956-213  mg/dL   Near or Above                    Optimal  130-159  mg/dL   Borderline  086-578  mg/dL   High  >469     mg/dL   Very High Performed at [facility], [address] [place], [place] [address]    Hgb A1c MFr Bld 09/27/2020 5.2  4.8 - 5.6 % Final   Comment: (NOTE)         Prediabetes: 5.7 - 6.4         Diabetes: >6.4         Glycemic control for adults with diabetes: <7.0    Mean Plasma Glucose 09/27/2020 103  mg/dL Final   Comment: (NOTE) Performed At: [facility] [address] [place], [place] [id number] Jolene Schimke MD [id number]    WBC 10/01/2020 8.8  4.0 - 10.5 K/uL Final   RBC 10/01/2020 4.64  4.22 - 5.81 MIL/uL Final   Hemoglobin 10/01/2020 14.2  13.0 - 17.0  g/dL Final   HCT 39/76/7341 42.5  39.0 - 52.0 % Final   MCV 10/01/2020 91.6  80.0 - 100.0 fL Final   MCH 10/01/2020 30.6  26.0 - 34.0 pg Final   MCHC 10/01/2020 33.4  30.0 - 36.0 g/dL Final   RDW 93/79/0240 12.9  11.5 - 15.5 % Final   Platelets 10/01/2020 271  150 - 400 K/uL Final   nRBC 10/01/2020 0.0  0.0 - 0.2 % Final   Neutrophils Relative % 10/01/2020 56  % Final   Neutro Abs 10/01/2020 4.9  1.7 - 7.7 K/uL Final   Lymphocytes Relative 10/01/2020 24  % Final   Lymphs Abs  10/01/2020 2.1  0.7 - 4.0 K/uL Final   Monocytes Relative 10/01/2020 9  % Final   Monocytes Absolute 10/01/2020 0.8  0.1 - 1.0 K/uL Final   Eosinophils Relative 10/01/2020 6  % Final   Eosinophils Absolute 10/01/2020 0.5  0.0 - 0.5 K/uL Final   Basophils Relative 10/01/2020 1  % Final   Basophils Absolute 10/01/2020 0.1  0.0 - 0.1 K/uL Final   Immature Granulocytes 10/01/2020 4  % Final   Abs Immature Granulocytes 10/01/2020 0.33 (A) 0.00 - 0.07 K/uL Final   Performed at [facility], [address], [place], [place] [address]   Sodium 10/01/2020 140  135 - 145 mmol/L Final   Potassium 10/01/2020 4.0  3.5 - 5.1 mmol/L Final   Chloride 10/01/2020 105  98 - 111 mmol/L Final   CO2 10/01/2020 22  22 - 32 mmol/L Final   Glucose, Bld 10/01/2020 90  70 - 99 mg/dL Final   Glucose reference range applies only to samples taken after fasting for at least 8 hours.   BUN 10/01/2020 16  6 - 20 mg/dL Final   Creatinine, Ser 10/01/2020 0.67  0.61 - 1.24 mg/dL Final   Calcium 29/92/4268 9.2  8.9 - 10.3 mg/dL Final   Total Protein 34/19/6222 6.9  6.5 - 8.1 g/dL Final   Albumin 97/98/9211 4.4  3.5 - 5.0 g/dL Final   AST 94/17/4081 32  15 - 41 U/L Final   ALT 10/01/2020 31  0 - 44 U/L Final   Alkaline Phosphatase 10/01/2020 51  38 - 126 U/L Final   Total Bilirubin 10/01/2020 0.5  0.3 - 1.2 mg/dL Final   GFR, Estimated 10/01/2020 >60  >60 mL/min Final   Comment: (NOTE) Calculated using the CKD-EPI Creatinine Equation (2021)    Anion gap 10/01/2020 13  5 - 15 Final   Performed at [facility], [address] [place], [place] [address]   Valproic Acid Lvl 10/01/2020 66  50.0 - 100.0 ug/mL Final   Performed at [facility], [address], [place], [place] [address]  Admission on 09/21/2020, Discharged on 09/25/2020  Component Date Value Ref Range Status   Sodium 09/21/2020 139  135 - 145 mmol/L Final   Potassium 09/21/2020 4.0  3.5 - 5.1 mmol/L  Final   Chloride 09/21/2020 103  98 - 111 mmol/L Final   CO2 09/21/2020 25  22 - 32 mmol/L Final   Glucose, Bld 09/21/2020 111 (A) 70 - 99 mg/dL Final   Glucose reference range applies only to samples taken after fasting for at least 8 hours.   BUN 09/21/2020 15  6 - 20 mg/dL Final   Creatinine, Ser 09/21/2020 0.95  0.61 - 1.24 mg/dL Final   Calcium 97/04/6376 9.4  8.9 - 10.3 mg/dL Final   Total  Protein 09/21/2020 6.8  6.5 - 8.1 g/dL Final   Albumin 16/12/9602 4.4  3.5 - 5.0 g/dL Final   AST 54/11/8117 15  15 - 41 U/L Final   ALT 09/21/2020 13  0 - 44 U/L Final   Alkaline Phosphatase 09/21/2020 62  38 - 126 U/L Final   Total Bilirubin 09/21/2020 0.6  0.3 - 1.2 mg/dL Final   GFR, Estimated 09/21/2020 >60  >60 mL/min Final   Comment: (NOTE) Calculated using the CKD-EPI Creatinine Equation (2021)    Anion gap 09/21/2020 11  5 - 15 Final   Performed at [facility], [address] [place], [place] [address]   Alcohol, Ethyl (B) 09/21/2020 <10  <10 mg/dL Final   Comment: (NOTE) Lowest detectable limit for serum alcohol is 10 mg/dL.  For medical purposes only. Performed at [facility], [address], [place], [place] [address]    Salicylate Lvl 09/21/2020 <7.0 (A) 7.0 - 30.0 mg/dL Final   Performed at [facility], [address], [place], [place] [address]   Acetaminophen (Tylenol), Serum 09/21/2020 <10 (A) 10 - 30 ug/mL Final   Comment: (NOTE) Therapeutic concentrations vary significantly. A range of 10-30 ug/mL  may be an effective concentration for many patients. However, some  are best treated at concentrations outside of this range. Acetaminophen concentrations >150 ug/mL at 4 hours after ingestion  and >50 ug/mL at 12 hours after ingestion are often associated with  toxic reactions.  Performed at [facility], [address], [place], [place] [address]    WBC 09/21/2020 10.5  4.0 - 10.5 K/uL Final   RBC 09/21/2020 4.58  4.22 - 5.81 MIL/uL Final    Hemoglobin 09/21/2020 13.9  13.0 - 17.0 g/dL Final   HCT 62/95/2841 40.0  39.0 - 52.0 % Final   MCV 09/21/2020 87.3  80.0 - 100.0 fL Final   MCH 09/21/2020 30.3  26.0 - 34.0 pg Final   MCHC 09/21/2020 34.8  30.0 - 36.0 g/dL Final   RDW 32/44/0102 12.3  11.5 - 15.5 % Final   Platelets 09/21/2020 292  150 - 400 K/uL Final   nRBC 09/21/2020 0.0  0.0 - 0.2 % Final   Performed at [facility], [address], [place], [place] [address]   Opiates 09/21/2020 NONE DETECTED  NONE DETECTED Final   Cocaine 09/21/2020 NONE DETECTED  NONE DETECTED Final   Benzodiazepines 09/21/2020 NONE DETECTED  NONE DETECTED Final   Amphetamines 09/21/2020 NONE DETECTED  NONE DETECTED Final   Tetrahydrocannabinol 09/21/2020 NONE DETECTED  NONE DETECTED Final   Barbiturates 09/21/2020 NONE DETECTED  NONE DETECTED Final   Comment: (NOTE) DRUG SCREEN FOR MEDICAL PURPOSES ONLY.  IF CONFIRMATION IS NEEDED FOR ANY PURPOSE, NOTIFY LAB WITHIN 5 DAYS.  LOWEST DETECTABLE LIMITS FOR URINE DRUG SCREEN Drug Class                     Cutoff (ng/mL) Amphetamine and metabolites    1000 Barbiturate and metabolites    200 Benzodiazepine                 200 Tricyclics and metabolites     300 Opiates and metabolites        300 Cocaine and metabolites        300 THC  50 Performed at [facility], [address], [place], [place] [address]    SARS Coronavirus 2 by RT PCR 09/21/2020 NEGATIVE  NEGATIVE Final   Comment: (NOTE) SARS-CoV-2 target nucleic acids are NOT DETECTED.  The SARS-CoV-2 RNA is generally detectable in upper respiratory specimens during the acute phase of infection. The lowest concentration of SARS-CoV-2 viral copies this assay can detect is 138 copies/mL. A negative result does not preclude SARS-Cov-2 infection and should not be used as the sole basis for treatment or other patient management decisions. A negative result may occur with  improper specimen  collection/handling, submission of specimen other than nasopharyngeal swab, presence of viral mutation(s) within the areas targeted by this assay, and inadequate number of viral copies(<138 copies/mL). A negative result must be combined with clinical observations, patient history, and epidemiological information. The expected result is Negative.  Fact Sheet for Patients:  BloggerCourse.com  Fact Sheet for Healthcare Providers:  SeriousBroker.it  This test is no                          t yet approved or cleared by the [place] FDA and  has been authorized for detection and/or diagnosis of SARS-CoV-2 by FDA under an Emergency Use Authorization (EUA). This EUA will remain  in effect (meaning this test can be used) for the duration of the COVID-19 declaration under Section 564(b)(1) of the Act, 21 U.S.C.section 360bbb-3(b)(1), unless the authorization is terminated  or revoked sooner.       Influenza A by PCR 09/21/2020 NEGATIVE  NEGATIVE Final   Influenza B by PCR 09/21/2020 NEGATIVE  NEGATIVE Final   Comment: (NOTE) The Xpert Xpress SARS-CoV-2/FLU/RSV plus assay is intended as an aid in the diagnosis of influenza from Nasopharyngeal swab specimens and should not be used as a sole basis for treatment. Nasal washings and aspirates are unacceptable for Xpert Xpress SARS-CoV-2/FLU/RSV testing.  Fact Sheet for Patients: BloggerCourse.com  Fact Sheet for Healthcare Providers: SeriousBroker.it  This test is not yet approved or cleared by the [place] FDA and has been authorized for detection and/or diagnosis of SARS-CoV-2 by FDA under an Emergency Use Authorization (EUA). This EUA will remain in effect (meaning this test can be used) for the duration of the COVID-19 declaration under Section 564(b)(1) of the Act, 21 U.S.C. section 360bbb-3(b)(1), unless the authorization is  terminated or revoked.  Performed at [facility], [address] [place], [place] [address]    Color, Urine 09/21/2020 YELLOW  YELLOW Final   APPearance 09/21/2020 CLEAR  CLEAR Final   Specific Gravity, Urine 09/21/2020 1.014  1.005 - 1.030 Final   pH 09/21/2020 7.0  5.0 - 8.0 Final   Glucose, UA 09/21/2020 NEGATIVE  NEGATIVE mg/dL Final   Hgb urine dipstick 09/21/2020 NEGATIVE  NEGATIVE Final   Bilirubin Urine 09/21/2020 NEGATIVE  NEGATIVE Final   Ketones, ur 09/21/2020 NEGATIVE  NEGATIVE mg/dL Final   Protein, ur 09/81/1914 NEGATIVE  NEGATIVE mg/dL Final   Nitrite 78/29/5621 NEGATIVE  NEGATIVE Final   Leukocytes,Ua 09/21/2020 NEGATIVE  NEGATIVE Final   Performed at [facility], [address] [place], [place] [address]   Lithium Lvl 09/23/2020 0.39 (A) 0.60 - 1.20 mmol/L Final   Performed at [facility], [address], [place], [place] [address]   Sodium 09/23/2020 140  135 - 145 mmol/L Final   Potassium 09/23/2020 3.9  3.5 - 5.1 mmol/L Final  Chloride 09/23/2020 105  98 - 111 mmol/L Final   BUN 09/23/2020 14  6 - 20 mg/dL Final   Creatinine, Ser 09/23/2020 0.80  0.61 - 1.24 mg/dL Final   Glucose, Bld 16/12/9602 96  70 - 99 mg/dL Final   Glucose reference range applies only to samples taken after fasting for at least 8 hours.   Calcium, Ion 09/23/2020 1.22  1.15 - 1.40 mmol/L Final   TCO2 09/23/2020 26  22 - 32 mmol/L Final   Hemoglobin 09/23/2020 14.3  13.0 - 17.0 g/dL Final   HCT 54/11/8117 42.0  39.0 - 52.0 % Final   Sodium 09/23/2020 138  135 - 145 mmol/L Final   Potassium 09/23/2020 4.1  3.5 - 5.1 mmol/L Final   Chloride 09/23/2020 105  98 - 111 mmol/L Final   CO2 09/23/2020 25  22 - 32 mmol/L Final   Glucose, Bld 09/23/2020 99  70 - 99 mg/dL Final   Glucose reference range applies only to samples taken after fasting for at least 8 hours.   BUN 09/23/2020 13  6 - 20 mg/dL Final   Creatinine, Ser 09/23/2020 0.91  0.61 - 1.24 mg/dL Final   Calcium  14/78/2956 9.2  8.9 - 10.3 mg/dL Final   Total Protein 21/30/8657 6.2 (A) 6.5 - 8.1 g/dL Final   Albumin 84/69/6295 4.0  3.5 - 5.0 g/dL Final   AST 28/41/3244 13 (A) 15 - 41 U/L Final   ALT 09/23/2020 11  0 - 44 U/L Final   Alkaline Phosphatase 09/23/2020 49  38 - 126 U/L Final   Total Bilirubin 09/23/2020 0.7  0.3 - 1.2 mg/dL Final   GFR, Estimated 09/23/2020 >60  >60 mL/min Final   Comment: (NOTE) Calculated using the CKD-EPI Creatinine Equation (2021)    Anion gap 09/23/2020 8  5 - 15 Final   Performed at [facility], [address], [place], [place] [address]   WBC 09/23/2020 9.6  4.0 - 10.5 K/uL Final   RBC 09/23/2020 4.84  4.22 - 5.81 MIL/uL Final   Hemoglobin 09/23/2020 14.8  13.0 - 17.0 g/dL Final   HCT 25/36/6440 43.4  39.0 - 52.0 % Final   MCV 09/23/2020 89.7  80.0 - 100.0 fL Final   MCH 09/23/2020 30.6  26.0 - 34.0 pg Final   MCHC 09/23/2020 34.1  30.0 - 36.0 g/dL Final   RDW 34/74/2595 12.4  11.5 - 15.5 % Final   Platelets 09/23/2020 291  150 - 400 K/uL Final   nRBC 09/23/2020 0.0  0.0 - 0.2 % Final   Performed at [facility], [address], [place], [place] [address]   Glucose-Capillary 09/23/2020 100 (A) 70 - 99 mg/dL Final   Glucose reference range applies only to samples taken after fasting for at least 8 hours.   Comment 1 09/23/2020 Notify RN   Final   Troponin I (High Sensitivity) 09/23/2020 3  <18 ng/L Final   Comment: (NOTE) Elevated high sensitivity troponin I (hsTnI) values and significant  changes across serial measurements may suggest ACS but many other  chronic and acute conditions are known to elevate hsTnI results.  Refer to the "Links" section for chest pain algorithms and additional  guidance. Performed at [facility], [address] [place], [place] [address]    Magnesium 09/23/2020 2.3  1.7 - 2.4 mg/dL Final   Performed at [facility], [address], [place], [place] [address]   Phosphorus 09/23/2020 3.5  2.5 - 4.6  mg/dL Final   Performed at [facility], [address], [place], [place] [address]   TSH 09/23/2020 1.957  0.350 - 4.500 uIU/mL Final   Comment: Performed by a 3rd Generation assay with a functional sensitivity of <=0.01 uIU/mL. Performed at [facility], [address], [place], [place] [address]    T3, Free 09/23/2020 3.3  2.0 - 4.4 pg/mL Final   Comment: (NOTE) Performed At: [facility] [address] [place], [place] [id number] Jolene Schimke MD [id number]    SARS Coronavirus 2 09/24/2020 NEGATIVE  NEGATIVE Final   Comment: (NOTE) SARS-CoV-2 target nucleic acids are NOT DETECTED.  The SARS-CoV-2 RNA is generally detectable in upper and lower respiratory specimens during the acute phase of infection. Negative results do not preclude SARS-CoV-2 infection, do not rule out co-infections with other pathogens, and should not be used as the sole basis for treatment or other patient management decisions. Negative results must be combined with clinical observations, patient history, and epidemiological information. The expected result is Negative.  Fact Sheet for Patients: HairSlick.no  Fact Sheet for Healthcare Providers: quierodirigir.com  This test is not yet approved or cleared by the [place] FDA and  has been authorized for detection and/or diagnosis of SARS-CoV-2 by FDA under an Emergency Use Authorization (EUA). This EUA will remain  in effect (meaning this test can be used) for the duration of the COVID-19 declaration under Se                          ction 564(b)(1) of the Act, 21 U.S.C. section 360bbb-3(b)(1), unless the authorization is terminated or revoked sooner.  Performed at [facility], [address], [place], [place] [address]     Allergies: Erythromycin  PTA Medications: (Not in a hospital admission)   Medical Decision Making  Patient was medically cleared in  the emergency department  Reviewed TSH, Hgb A1C, and Lipid Panel from 09/27/2020  Lab Orders         RPR         Hepatitis panel, acute         HIV Antibody (routine testing w rflx)      Restart depakote 1000 mg QHS for mood stability Restart olanzapine at 10 mg QHS for mood stability Restart doxepin at 10 mg QHS for sleep  Due to time, will give one time doses of depakote 250 mg and zyprexa 5 mg oral.   Clinical Course as of 11/14/20 0545  Tue Nov 13, 2020  1215 Vent. rate 63 BPM PR interval 120 ms QRS duration 82 ms QT/QTcB 422/431 ms P-R-T axes 56 71 74 Normal sinus rhythm Normal ECG Confirmed by Margarita Grizzle [phone]) on 11/13/2020 2:15:58 PM [JB]    Clinical Course User Index [JB] Jackelyn Poling, NP    Recommendations  Based on my evaluation the patient does not appear to have an emergency medical condition.  Jackelyn Poling, NP 11/14/20  2:57 AM

## 2020-11-14 NOTE — ED Notes (Signed)
Breakfast given: biscuit, stewed apples, apple cinnamon oatmeal and coffee 

## 2020-11-14 NOTE — Progress Notes (Signed)
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       Influenza A by PCR 10/06/2020 NEGATIVE  NEGATIVE Final   Influenza B by PCR 10/06/2020 NEGATIVE  NEGATIVE Final   Comment: (NOTE) The Xpert Xpress  SARS-CoV-2/FLU/RSV plus assay is intended as an aid in the diagnosis of influenza from Nasopharyngeal swab specimens and should not be used as a sole basis for treatment. Nasal washings and aspirates are unacceptable for Xpert Xpress  SARS-CoV-2/FLU/RSV testing.  Fact Sheet for Patients: BloggerCourse.com  Fact Sheet for Healthcare Providers: SeriousBroker.it  This test is not yet approved or cleared by the [place] FDA and has been authorized for detection and/or diagnosis of SARS-CoV-2 by FDA under an Emergency Use Authorization (EUA). This EUA will remain in effect (meaning this test can be used) for the duration of the COVID-19 declaration under Section 564(b)(1) of the Act, 21 U.S.C. section 360bbb-3(b)(1), unless the authorization is terminated or revoked.  Performed at [facility], [address], [place], [place] [address]    Opiates 10/07/2020 NONE DETECTED  NONE DETECTED Final   Cocaine 10/07/2020 NONE DETECTED  NONE DETECTED Final   Benzodiazepines 10/07/2020 NONE DETECTED  NONE DETECTED Final   Amphetamines 10/07/2020 POSITIVE (A) NONE DETECTED Final   Tetrahydrocannabinol 10/07/2020 NONE DETECTED  NONE DETECTED Final   Barbiturates 10/07/2020 NONE DETECTED  NONE DETECTED Final   Comment: (NOTE) DRUG SCREEN FOR MEDICAL PURPOSES ONLY.  IF CONFIRMATION IS NEEDED FOR ANY PURPOSE, NOTIFY LAB WITHIN 5 DAYS.  LOWEST DETECTABLE LIMITS FOR URINE DRUG SCREEN Drug Class                     Cutoff (ng/mL) Amphetamine and metabolites    1000 Barbiturate and metabolites    200 Benzodiazepine                 200 Tricyclics and metabolites     300 Opiates and metabolites        300 Cocaine and metabolites        300 THC                            50 Performed at [facility], [address], [place], [place] [address]   Admission on 09/25/2020, Discharged on 10/01/2020  Component  Date Value Ref Range Status   Cholesterol 09/27/2020 161  0 - 200 mg/dL Final   Triglycerides 81/15/7262 156 (A) <150 mg/dL Final   HDL 03/55/9741 52  >40 mg/dL Final   Total CHOL/HDL Ratio 09/27/2020 3.1  RATIO Final   VLDL 09/27/2020 31  0 - 40 mg/dL Final   LDL Cholesterol 09/27/2020 78  0 - 99 mg/dL Final   Comment:        Total Cholesterol/HDL:CHD Risk Coronary Heart Disease Risk Table                     Men   Women  1/2 Average Risk   3.4   3.3  Average Risk       5.0   4.4  2 X Average Risk   9.6   7.1  3 X Average Risk  23.4   11.0        Use the calculated Patient Ratio above and the CHD Risk Table to determine the patient's CHD Risk.        ATP III CLASSIFICATION (LDL):  <100     mg/dL   Optimal  638-453  mg/dL   Near or Above                    Optimal  130-159  mg/dL   Borderline  646-803  mg/dL   High  >212     mg/dL   Very High Performed at [facility], [address] [place], [place] [address]    Hgb A1c MFr Bld 09/27/2020  5.2  4.8 - 5.6 % Final   Comment: (NOTE)         Prediabetes: 5.7 - 6.4         Diabetes: >6.4         Glycemic control for adults with diabetes: <7.0    Mean Plasma Glucose 09/27/2020 103  mg/dL Final   Comment: (NOTE) Performed At: [facility] [address] [place], [place] [id number] Jolene Schimke MD JY:[id number]    WBC 10/01/2020 8.8  4.0 - 10.5 K/uL Final   RBC 10/01/2020 4.64  4.22 - 5.81 MIL/uL Final   Hemoglobin 10/01/2020 14.2  13.0 - 17.0 g/dL Final   HCT 86/57/8469 42.5  39.0 - 52.0 % Final   MCV 10/01/2020 91.6  80.0 - 100.0 fL Final   MCH 10/01/2020 30.6  26.0 - 34.0 pg Final   MCHC 10/01/2020 33.4  30.0 - 36.0 g/dL Final   RDW 62/95/2841 12.9  11.5 - 15.5 % Final   Platelets 10/01/2020 271  150 - 400 K/uL Final   nRBC 10/01/2020 0.0  0.0 - 0.2 % Final   Neutrophils Relative % 10/01/2020 56  % Final   Neutro Abs 10/01/2020 4.9  1.7 - 7.7 K/uL Final   Lymphocytes Relative 10/01/2020 24   % Final   Lymphs Abs 10/01/2020 2.1  0.7 - 4.0 K/uL Final   Monocytes Relative 10/01/2020 9  % Final   Monocytes Absolute 10/01/2020 0.8  0.1 - 1.0 K/uL Final   Eosinophils Relative 10/01/2020 6  % Final   Eosinophils Absolute 10/01/2020 0.5  0.0 - 0.5 K/uL Final   Basophils Relative 10/01/2020 1  % Final   Basophils Absolute 10/01/2020 0.1  0.0 - 0.1 K/uL Final   Immature Granulocytes 10/01/2020 4  % Final   Abs Immature Granulocytes 10/01/2020 0.33 (A) 0.00 - 0.07 K/uL Final   Performed at [facility], [address], [place], [place] [address]   Sodium 10/01/2020 140  135 - 145 mmol/L Final   Potassium 10/01/2020 4.0  3.5 - 5.1 mmol/L Final   Chloride 10/01/2020 105  98 - 111 mmol/L Final   CO2 10/01/2020 22  22 - 32 mmol/L Final   Glucose, Bld 10/01/2020 90  70 - 99 mg/dL Final   Glucose reference range applies only to samples taken after fasting for at least 8 hours.   BUN 10/01/2020 16  6 - 20 mg/dL Final   Creatinine, Ser 10/01/2020 0.67  0.61 - 1.24 mg/dL Final   Calcium 12/28/2534 9.2  8.9 - 10.3 mg/dL Final   Total Protein 64/40/3474 6.9  6.5 - 8.1 g/dL Final   Albumin 25/95/6387 4.4  3.5 - 5.0 g/dL Final   AST 56/43/3295 32  15 - 41 U/L Final   ALT 10/01/2020 31  0 - 44 U/L Final   Alkaline Phosphatase 10/01/2020 51  38 - 126 U/L Final   Total Bilirubin 10/01/2020 0.5  0.3 - 1.2 mg/dL Final   GFR, Estimated 10/01/2020 >60  >60 mL/min Final   Comment: (NOTE) Calculated using the CKD-EPI Creatinine Equation (2021)    Anion gap 10/01/2020 13  5 - 15 Final   Performed at [facility], [address], [place], [place] [address]   Valproic Acid Lvl 10/01/2020 66  50.0 - 100.0 ug/mL Final   Performed at [facility], [address], [place], [place] [address]  Admission on 09/21/2020, Discharged on 09/25/2020  Component Date Value Ref Range Status  Sodium 09/21/2020 139  135 - 145 mmol/L Final   Potassium 09/21/2020  4.0  3.5 - 5.1 mmol/L Final   Chloride 09/21/2020 103  98 - 111 mmol/L Final   CO2 09/21/2020 25  22 - 32 mmol/L Final   Glucose, Bld 09/21/2020 111 (A) 70 - 99 mg/dL Final   Glucose reference range applies only to samples taken after fasting for at least 8 hours.   BUN 09/21/2020 15  6 - 20 mg/dL Final   Creatinine, Ser 09/21/2020 0.95  0.61 - 1.24 mg/dL Final   Calcium 40/98/1191 9.4  8.9 - 10.3 mg/dL Final   Total Protein 47/82/9562 6.8  6.5 - 8.1 g/dL Final   Albumin 13/10/6576 4.4  3.5 - 5.0 g/dL Final   AST 46/96/2952 15  15 - 41 U/L Final   ALT 09/21/2020 13  0 - 44 U/L Final   Alkaline Phosphatase 09/21/2020 62  38 - 126 U/L Final   Total Bilirubin 09/21/2020 0.6  0.3 - 1.2 mg/dL Final   GFR, Estimated 09/21/2020 >60  >60 mL/min Final   Comment: (NOTE) Calculated using the CKD-EPI Creatinine Equation (2021)    Anion gap 09/21/2020 11  5 - 15 Final   Performed at [facility], [address], [place], [place] [address]   Alcohol, Ethyl (B) 09/21/2020 <10  <10 mg/dL Final   Comment: (NOTE) Lowest detectable limit for serum alcohol is 10 mg/dL.  For medical purposes only. Performed at [facility], [address] [place], [place] [address]    Salicylate Lvl 09/21/2020 <7.0 (A) 7.0 - 30.0 mg/dL Final   Performed at [facility], [address] [place], [place] [address]   Acetaminophen (Tylenol), Serum 09/21/2020 <10 (A) 10 - 30 ug/mL Final   Comment: (NOTE) Therapeutic concentrations vary significantly. A range of 10-30 ug/mL  may be an effective concentration for many patients. However, some  are best treated at concentrations outside of this range. Acetaminophen concentrations >150 ug/mL at 4 hours after ingestion  and >50 ug/mL at 12 hours after ingestion are often associated with  toxic reactions.  Performed at [facility], [address], [place], [place] [address]    WBC 09/21/2020 10.5  4.0 - 10.5 K/uL Final   RBC 09/21/2020 4.58  4.22  - 5.81 MIL/uL Final   Hemoglobin 09/21/2020 13.9  13.0 - 17.0 g/dL Final   HCT 34/74/2595 40.0  39.0 - 52.0 % Final   MCV 09/21/2020 87.3  80.0 - 100.0 fL Final   MCH 09/21/2020 30.3  26.0 - 34.0 pg Final   MCHC 09/21/2020 34.8  30.0 - 36.0 g/dL Final   RDW 63/87/5643 12.3  11.5 - 15.5 % Final   Platelets 09/21/2020 292  150 - 400 K/uL Final   nRBC 09/21/2020 0.0  0.0 - 0.2 % Final   Performed at [facility], [address] [place], [place] [address]   Opiates 09/21/2020 NONE DETECTED  NONE DETECTED Final   Cocaine 09/21/2020 NONE DETECTED  NONE DETECTED Final   Benzodiazepines 09/21/2020 NONE DETECTED  NONE DETECTED Final   Amphetamines 09/21/2020 NONE DETECTED  NONE DETECTED Final   Tetrahydrocannabinol 09/21/2020 NONE DETECTED  NONE DETECTED Final   Barbiturates 09/21/2020 NONE DETECTED  NONE DETECTED Final   Comment: (NOTE) DRUG SCREEN FOR MEDICAL PURPOSES ONLY.  IF CONFIRMATION IS NEEDED FOR ANY PURPOSE, NOTIFY LAB WITHIN 5 DAYS.  LOWEST DETECTABLE LIMITS FOR URINE DRUG SCREEN Drug Class  Cutoff (ng/mL) Amphetamine and metabolites    1000 Barbiturate and metabolites    200 Benzodiazepine                 200 Tricyclics and metabolites     300 Opiates and metabolites        300 Cocaine and metabolites        300 THC                            50 Performed at [facility], [address] [place], [place] [address]    SARS Coronavirus 2 by RT PCR 09/21/2020 NEGATIVE  NEGATIVE Final   Comment: (NOTE) SARS-CoV-2 target nucleic acids are NOT DETECTED.  The SARS-CoV-2 RNA is generally detectable in upper respiratory specimens during the acute phase of infection. The lowest concentration of SARS-CoV-2 viral copies this assay can detect is 138 copies/mL. A negative result does not preclude SARS-Cov-2 infection and should not be used as the sole basis for treatment or other patient management decisions. A negative result may occur with   improper specimen collection/handling, submission of specimen other than nasopharyngeal swab, presence of viral mutation(s) within the areas targeted by this assay, and inadequate number of viral copies(<138 copies/mL). A negative result must be combined with clinical observations, patient history, and epidemiological information. The expected result is Negative.  Fact Sheet for Patients:  BloggerCourse.com  Fact Sheet for Healthcare Providers:  SeriousBroker.it  This test is no                          t yet approved or cleared by the [place] FDA and  has been authorized for detection and/or diagnosis of SARS-CoV-2 by FDA under an Emergency Use Authorization (EUA). This EUA will remain  in effect (meaning this test can be used) for the duration of the COVID-19 declaration under Section 564(b)(1) of the Act, 21 U.S.C.section 360bbb-3(b)(1), unless the authorization is terminated  or revoked sooner.       Influenza A by PCR 09/21/2020 NEGATIVE  NEGATIVE Final   Influenza B by PCR 09/21/2020 NEGATIVE  NEGATIVE Final   Comment: (NOTE) The Xpert Xpress SARS-CoV-2/FLU/RSV plus assay is intended as an aid in the diagnosis of influenza from Nasopharyngeal swab specimens and should not be used as a sole basis for treatment. Nasal washings and aspirates are unacceptable for Xpert Xpress SARS-CoV-2/FLU/RSV testing.  Fact Sheet for Patients: BloggerCourse.com  Fact Sheet for Healthcare Providers: SeriousBroker.it  This test is not yet approved or cleared by the [place] FDA and has been authorized for detection and/or diagnosis of SARS-CoV-2 by FDA under an Emergency Use Authorization (EUA). This EUA will remain in effect (meaning this test can be used) for the duration of the COVID-19 declaration under Section 564(b)(1) of the Act, 21 U.S.C. section 360bbb-3(b)(1), unless  the authorization is terminated or revoked.  Performed at [facility], [address], [place], [place] [address]    Color, Urine 09/21/2020 YELLOW  YELLOW Final   APPearance 09/21/2020 CLEAR  CLEAR Final   Specific Gravity, Urine 09/21/2020 1.014  1.005 - 1.030 Final   pH 09/21/2020 7.0  5.0 - 8.0 Final   Glucose, UA 09/21/2020 NEGATIVE  NEGATIVE mg/dL Final   Hgb urine dipstick 09/21/2020 NEGATIVE  NEGATIVE Final   Bilirubin Urine 09/21/2020 NEGATIVE  NEGATIVE Final   Ketones, ur 09/21/2020 NEGATIVE  NEGATIVE mg/dL Final  Protein, ur 09/21/2020 NEGATIVE  NEGATIVE mg/dL Final   Nitrite 16/12/9602 NEGATIVE  NEGATIVE Final   Leukocytes,Ua 09/21/2020 NEGATIVE  NEGATIVE Final   Performed at [facility], [address] [place], [place] [address]   Lithium Lvl 09/23/2020 0.39 (A) 0.60 - 1.20 mmol/L Final   Performed at [facility], [address] [place], [place] [address]   Sodium 09/23/2020 140  135 - 145 mmol/L Final   Potassium 09/23/2020 3.9  3.5 - 5.1 mmol/L Final   Chloride 09/23/2020 105  98 - 111 mmol/L Final   BUN 09/23/2020 14  6 - 20 mg/dL Final   Creatinine, Ser 09/23/2020 0.80  0.61 - 1.24 mg/dL Final   Glucose, Bld 78/29/5621 96  70 - 99 mg/dL Final   Glucose reference range applies only to samples taken after fasting for at least 8 hours.   Calcium, Ion 09/23/2020 1.22  1.15 - 1.40 mmol/L Final   TCO2 09/23/2020 26  22 - 32 mmol/L Final   Hemoglobin 09/23/2020 14.3  13.0 - 17.0 g/dL Final   HCT 30/86/5784 42.0  39.0 - 52.0 % Final   Sodium 09/23/2020 138  135 - 145 mmol/L Final   Potassium 09/23/2020 4.1  3.5 - 5.1 mmol/L Final   Chloride 09/23/2020 105  98 - 111 mmol/L Final   CO2 09/23/2020 25  22 - 32 mmol/L Final   Glucose, Bld 09/23/2020 99  70 - 99 mg/dL Final   Glucose reference range applies only to samples taken after fasting for at least 8 hours.   BUN 09/23/2020 13  6 - 20 mg/dL Final   Creatinine, Ser 09/23/2020 0.91  0.61 - 1.24  mg/dL Final   Calcium 69/62/9528 9.2  8.9 - 10.3 mg/dL Final   Total Protein 41/32/4401 6.2 (A) 6.5 - 8.1 g/dL Final   Albumin 02/72/5366 4.0  3.5 - 5.0 g/dL Final   AST 44/05/4740 13 (A) 15 - 41 U/L Final   ALT 09/23/2020 11  0 - 44 U/L Final   Alkaline Phosphatase 09/23/2020 49  38 - 126 U/L Final   Total Bilirubin 09/23/2020 0.7  0.3 - 1.2 mg/dL Final   GFR, Estimated 09/23/2020 >60  >60 mL/min Final   Comment: (NOTE) Calculated using the CKD-EPI Creatinine Equation (2021)    Anion gap 09/23/2020 8  5 - 15 Final   Performed at [facility], [address] [place], [place] [address]   WBC 09/23/2020 9.6  4.0 - 10.5 K/uL Final   RBC 09/23/2020 4.84  4.22 - 5.81 MIL/uL Final   Hemoglobin 09/23/2020 14.8  13.0 - 17.0 g/dL Final   HCT 87/56/4332 43.4  39.0 - 52.0 % Final   MCV 09/23/2020 89.7  80.0 - 100.0 fL Final   MCH 09/23/2020 30.6  26.0 - 34.0 pg Final   MCHC 09/23/2020 34.1  30.0 - 36.0 g/dL Final   RDW 95/18/8416 12.4  11.5 - 15.5 % Final   Platelets 09/23/2020 291  150 - 400 K/uL Final   nRBC 09/23/2020 0.0  0.0 - 0.2 % Final   Performed at [facility], [address], [place], [place] [address]   Glucose-Capillary 09/23/2020 100 (A) 70 - 99 mg/dL Final   Glucose reference range applies only to samples taken after fasting for at least 8 hours.   Comment 1 09/23/2020 Notify RN   Final   Troponin I (High Sensitivity) 09/23/2020 3  <18 ng/L Final   Comment: (NOTE)  Elevated high sensitivity troponin I (hsTnI) values and significant  changes across serial measurements may suggest ACS but many other  chronic and acute conditions are known to elevate hsTnI results.  Refer to the "Links" section for chest pain algorithms and additional  guidance. Performed at [facility], [address], [place], [place] [address]    Magnesium 09/23/2020 2.3  1.7 - 2.4 mg/dL Final   Performed at [facility], [address], [place], [place] [address]   Phosphorus  09/23/2020 3.5  2.5 - 4.6 mg/dL Final   Performed at [facility], [address], [place], [place] [address]   TSH 09/23/2020 1.957  0.350 - 4.500 uIU/mL Final   Comment: Performed by a 3rd Generation assay with a functional sensitivity of <=0.01 uIU/mL. Performed at [facility], [address] [place], [place] [address]    T3, Free 09/23/2020 3.3  2.0 - 4.4 pg/mL Final   Comment: (NOTE) Performed At: [facility] [address] [place], [place] [id number] Jolene Schimke MD CZ:[id number]    SARS Coronavirus 2 09/24/2020 NEGATIVE  NEGATIVE Final   Comment: (NOTE) SARS-CoV-2 target nucleic acids are NOT DETECTED.  The SARS-CoV-2 RNA is generally detectable in upper and lower respiratory specimens during the acute phase of infection. Negative results do not preclude SARS-CoV-2 infection, do not rule out co-infections with other pathogens, and should not be used as the sole basis for treatment or other patient management decisions. Negative results must be combined with clinical observations, patient history, and epidemiological information. The expected result is Negative.  Fact Sheet for Patients: HairSlick.no  Fact Sheet for Healthcare Providers: quierodirigir.com  This test is not yet approved or cleared by the [place] FDA and  has been authorized for detection and/or diagnosis of SARS-CoV-2 by FDA under an Emergency Use Authorization (EUA). This EUA will remain  in effect (meaning this test can be used) for the duration of the COVID-19 declaration under Se                          ction 564(b)(1) of the Act, 21 U.S.C. section 360bbb-3(b)(1), unless the authorization is terminated or revoked sooner.  Performed at [facility], [address], [place], [place] [address]     Blood Alcohol level:  Lab Results  Component Value Date   ETH <10 11/13/2020   ETH <10 10/11/2020     Metabolic Disorder Labs: Lab Results  Component Value Date   HGBA1C 5.2 09/27/2020   MPG 103 09/27/2020   MPG 102.54 01/24/2020   No results found for: PROLACTIN Lab Results  Component Value Date   CHOL 161 09/27/2020   TRIG 156 (H) 09/27/2020   HDL 52 09/27/2020   CHOLHDL 3.1 09/27/2020   VLDL 31 09/27/2020   LDLCALC 78 09/27/2020   LDLCALC 120 (H) 01/24/2020    Therapeutic Lab Levels: Lab Results  Component Value Date   LITHIUM 0.39 (L) 09/23/2020   LITHIUM 0.38 (L) 01/27/2020   Lab Results  Component Value Date   VALPROATE 29 (L) 10/12/2020   VALPROATE 66 10/01/2020   No components found for:  CBMZ  Physical Findings   AIMS    Flowsheet Row [facility]  AIMS Total Score 0      AUDIT    Flowsheet Row [facility]  [facility]  Alcohol Use Disorder Identification Test Final Score (AUDIT) 0 8      Flowsheet Row ED from 11/14/2020 in [facility] ED from 11/13/2020 in [facility] ED from 10/27/2020 in [facility]  C-SSRS RISK CATEGORY High Risk High Risk Error: Question 6 not populated        Musculoskeletal  Strength & Muscle Tone: within normal limits Gait & Station: normal Patient leans: N/A  Psychiatric Specialty Exam  Presentation  General Appearance: Appropriate for Environment; Fairly Groomed; Casual  Eye Contact:Good  Speech:Clear and Coherent; Rapid rate  Speech Volume:Normal  Handedness:Not asssessed   Mood and Affect  Mood: "mentally and physically exhausted"  Affect:Congruent, anxious   Thought Process  Thought Processes:Coherent; Goal Directed; Linear  Descriptions of  Associations:Intact  Orientation:Full (Time, Place and Person)  Thought Content:Logical  Diagnosis of Schizophrenia or Schizoaffective disorder in past: No    Hallucinations:Hallucinations: Auditory Description of Auditory Hallucinations: hears whispering  Ideas of Reference:None  Suicidal Thoughts:Suicidal Thoughts: Yes, Active SI Active Intent and/or Plan: With Intent; With Plan; With Means to Carry Out  Homicidal Thoughts:Homicidal Thoughts: No   Sensorium  Memory:Immediate Good; Recent Good; Remote Good  Judgment:Intact  Insight:Fair   Executive Functions  Concentration:Fair  Attention Span:Good  Recall:Good  Fund of Knowledge:Good  Language:Good   Psychomotor Activity  Psychomotor Activity:Psychomotor Activity: Normal   Assets  Assets:Communication Skills; Desire for Improvement; Physical Health   Sleep  Sleep:Sleep: Poor   Nutritional Assessment (For OBS and [facility] admissions only) Has the patient had a weight loss or gain of 10 pounds or more in the last 3 months?: No Has the patient had a decrease in food intake/or appetite?: No Does the patient have dental problems?: No Does the patient have eating habits or behaviors that may be indicators of an eating disorder including binging or inducing vomiting?: No Has the patient recently lost weight without trying?: 0 Has the patient been eating poorly because of a decreased appetite?: 0 Malnutrition Screening Tool Score: 0    Physical Exam  Physical Exam Vitals reviewed.  Constitutional:      General: He is not in acute distress.    Appearance: He is not ill-appearing or toxic-appearing.  HENT:     Head: Normocephalic and atraumatic.     Right Ear: External ear normal.     Left Ear: External ear normal.     Nose: Nose normal.  Eyes:     General: No scleral icterus.    Extraocular Movements: Extraocular movements intact.     Conjunctiva/sclera: Conjunctivae normal.  Pulmonary:     Effort:  Pulmonary effort is normal.  Musculoskeletal:        General: Normal range of motion.     Cervical back: Normal range of motion.  Neurological:     General: No focal deficit present.     Mental Status: He is alert.   Review of Systems  All other systems reviewed and are negative. Blood pressure 117/65, pulse 82, temperature 98.8 F (37.1 C), temperature source Oral, resp. rate 18, height 5\' 10"  (1.778 m), weight 74.8 kg, SpO2 99 %. Body mass index is 23.66 kg/m.  Treatment Plan Summary: Daily contact with patient to assess and evaluate symptoms and progress in treatment  Elvan is a 35 y.o. male with a history of Bipolar I disorder and polysubstance use admitted to the [facility] from [facility]  due to SI with plan to jump in front of a train. Patient is agreeable to [facility] admission. Labs ordered and reviewed - CMP, CBC grossly normal; EtOH, UDS, Resp panel negative; RPR, Hepatitis panel, HIV non-reactive; EKG wnl. Patient fits criteria for bipolar disorder with manic episodes, depressive episodes, and psychotic symptoms - these episodes do occur outside of periods of substance use per history (although manic episodes are less intense and less frequent - unclear if he meets criteria for bipolar I or II). Family history is also concerning for major mental illness. He appears to be in a mixed episode currently. He also has a history of substance-induced manic episodes. Due to his current living situation and his prior negative experience with lithium, it is better to use antipsychotics for treatment. Possible to add Prozac depending on clinical condition tomorrow. Patient remains appropriate for treatment at [facility].   Bipolar I disorder - discontinue doxepin 10mg  qhs - discontinue Depakote ER 1000mg  qhs       - patient did receive depakote tonight - Olanzapine 10mg  qhs  Substance use - Nicoderm CQ 21mg /16hr patch - Accepted to [facility] residential rehab. Will start on 9/16 at 0900  Dispo - Going to [place]  residential program on 9/16 at 0900  [facility], Medical Student 11/14/2020 1:08 PM

## 2020-11-14 NOTE — ED Notes (Signed)
Pt asleep in bed. Respirations even and unlabored. Will continue to monitor for safety. ?

## 2020-11-14 NOTE — Clinical Social Work Psych Note (Signed)
CSW Update   Brett House will be discharging on Monday, 11/19/2020. He will need to arrive by 9:00am to participate in his screenings for admission.   Safe Transport will need to be arranged for the patient.   [name] will also need a 14 day supply of medications, with his 30-day prescription.   [name] does not have any additional questions or concerns at this time.     [name], MSW, LCSW Clinical Child psychotherapist (Facility Based Crisis) [facility]

## 2020-11-14 NOTE — ED Notes (Signed)
Brett House is currently sleeping with positive rise and fall of chest noted skin color appropriate for ethnicity.

## 2020-11-14 NOTE — ED Notes (Addendum)
Pt admitted to [facility] due to SI with plan to "jump in front of a train." Pt A&O x4, calm and cooperative. Pt denies current SI/HI/AVH and is able to verbally contract for safety. Pt states he experienced AH 2 days ago "hearing my name being whispered and taunting me." Pt ambulated independently to unit. Oriented to unit/staff. Pt took shower once on unit then was given microwave chicken meal per his request. Pt denies any further needs at this time. No signs of acute distress noted. Will continue to monitor for safety.

## 2020-11-14 NOTE — ED Notes (Signed)
[facility] called for [employer] transport to [facility].

## 2020-11-15 DIAGNOSIS — F1290 Cannabis use, unspecified, uncomplicated: Secondary | ICD-10-CM | POA: Diagnosis not present

## 2020-11-15 DIAGNOSIS — F319 Bipolar disorder, unspecified: Secondary | ICD-10-CM | POA: Diagnosis not present

## 2020-11-15 DIAGNOSIS — F17210 Nicotine dependence, cigarettes, uncomplicated: Secondary | ICD-10-CM | POA: Diagnosis not present

## 2020-11-15 DIAGNOSIS — F419 Anxiety disorder, unspecified: Secondary | ICD-10-CM | POA: Diagnosis not present

## 2020-11-15 MED ORDER — OLANZAPINE 10 MG PO TBDP: 10.0000 mg | ORAL_TABLET | Freq: Every day | ORAL | 1 refills | Status: DC

## 2020-11-15 MED ORDER — HYDROXYZINE HCL 25 MG PO TABS
25.0000 mg | ORAL_TABLET | Freq: Three times a day (TID) | ORAL | Status: DC | PRN
  Administered 2020-11-15: 25 mg via ORAL
  Filled 2020-11-15: qty 15
  Filled 2020-11-15: qty 1

## 2020-11-15 MED ORDER — HYDROXYZINE HCL 25 MG PO TABS: 25.0000 mg | ORAL_TABLET | Freq: Three times a day (TID) | ORAL | 1 refills | Status: DC | PRN

## 2020-11-15 MED ORDER — FLUOXETINE HCL 20 MG PO CAPS
20.0000 mg | ORAL_CAPSULE | Freq: Every day | ORAL | Status: DC
  Administered 2020-11-15 – 2020-11-19 (×5): 20 mg via ORAL
  Filled 2020-11-15 (×5): qty 1
  Filled 2020-11-15: qty 14

## 2020-11-15 MED ORDER — FLUOXETINE HCL 20 MG PO CAPS: 20.0000 mg | ORAL_CAPSULE | Freq: Every day | ORAL | 1 refills | Status: DC

## 2020-11-15 MED ORDER — NICOTINE 21 MG/24HR TD PT24: 21.0000 mg | MEDICATED_PATCH | Freq: Every day | TRANSDERMAL | 1 refills | Status: DC

## 2020-11-15 NOTE — Progress Notes (Signed)
Behavioral Health Progress Note  Date and Time: 11/15/2020 6:39 PM Name: Brett House MRN:  [id number]  Original note by MS3 Gwyndolyn Kaufman. I attest that I listened to the student's presentation and reviewed and edited the student note. I further attest that the components of the history of the present illness,  and the assessment and plan documented were performed by the student in the  presence of me (teaching physician). I verified the documentation, personally performed the physical examination and I also verified the medical decision making.   Subjective:  Patient seen and chart reviewed. Patient endorses fleeting SI this morning, but says that he has no intent or plan. He describes those thoughts as "not wanting to be here" which is c/w passive SI. He states that he would feel safe for discharge if he were to be accepted to substance use treatment. Marland Kitchen He denies HI, AVH. He says that he has been sleeping most of the time and is "very drowsy". Yesterday, the team discussed discontinuation of his Depakote which he agreed to. However, he received Depakote ER 1000mg  yesterday prior to discontinuation of the order.Pt verbalized understanding and expressed that he wants to make sure he is on the correct medications prior to discharge to [facility]. Previously discussed initiation of prozac; patien agreeable to starting Prozac as well. He says that he has lost 40 lbs in the last few months and would like to regain weight before considering an adjunct medication to address the Zyprexa side effects. Discussed with patient that if weight gain becomes a concern for him that he could discuss lybalvi with outpatient provider (olanzapine-samidorphan) which is a/w less weight gain although would have to remain opiate free in order to tolerate medication.   He is looking forward to reconnecting with his family and is motivated to address his substance use. He states that he has multiple prior 22, 18, and 9 month periods of  sobriety and that being able to connect with family was his main motivation. He says that his family has set the expectation that he is 12 months sober before they want to reconnect.  He has been accepted at [facility] and is looking forward to "getting clean". Originally he was scheduled for discharge to [facility] on 9/16, but is now delayed to 9/19 due to [facility] scheduling. He mentioned that last time he was denied from [facility] due to confusion over medication.  Diagnosis:  Final diagnoses:  Bipolar I disorder (HCC)    Total Time spent with patient: 20 minutes  Past Psychiatric History: See H&P Past Medical History:  Past Medical History:  Diagnosis Date   Anxiety    Asthma    Bipolar disorder (HCC)    Depression    No past surgical history on file. Family History: No family history on file. Family Psychiatric  History: See progress note from 9/14 Social History:  Social History   Substance and Sexual Activity  Alcohol Use Yes   Comment: 6 pack once a week for last 8-9 months     Social History   Substance and Sexual Activity  Drug Use Yes   Types: Marijuana   Comment: rarely    Social History   Socioeconomic History   Marital status: Single    Spouse name: Not on file   Number of children: 0   Years of education: Not on file   Highest education level: 12th grade  Occupational History   Occupation: Unemployed  Tobacco Use   Smoking status: Every Day  Packs/day: 1.50    Years: 15.00    Pack years: 22.50    Types: Cigarettes   Smokeless tobacco: Current  Vaping Use   Vaping Use: Never used  Substance and Sexual Activity   Alcohol use: Yes    Comment: 6 pack once a week for last 8-9 months   Drug use: Yes    Types: Marijuana    Comment: rarely   Sexual activity: Yes  Other Topics Concern   Not on file  Social History Narrative   Not on file   Social Determinants of Health   Financial Resource Strain: Not on file  Food Insecurity: Not on file   Transportation Needs: Not on file  Physical Activity: Not on file  Stress: Not on file  Social Connections: Not on file   SDOH:  SDOH Screenings   Alcohol Screen: Low Risk    Last Alcohol Screening Score (AUDIT): 0  Depression (PHQ2-9): Not on file  Financial Resource Strain: Not on file  Food Insecurity: Not on file  Housing: Not on file  Physical Activity: Not on file  Social Connections: Not on file  Stress: Not on file  Tobacco Use: High Risk   Smoking Tobacco Use: Every Day   Smokeless Tobacco Use: Current  Transportation Needs: Not on file   Additional Social History:                         Sleep: Fair  Appetite:  Fair  Current Medications:  Current Facility-Administered Medications  Medication Dose Route Frequency Provider Last Rate Last Admin   acetaminophen (TYLENOL) tablet 650 mg  650 mg Oral Q6H PRN Jackelyn Poling, NP       alum & mag hydroxide-simeth (MAALOX/MYLANTA) 200-200-20 MG/5ML suspension 30 mL  30 mL Oral Q4H PRN [name] A, NP       FLUoxetine (PROZAC) capsule 20 mg  20 mg Oral Daily Estella Husk, MD   20 mg at 11/15/20 1221   hydrOXYzine (ATARAX/VISTARIL) tablet 25 mg  25 mg Oral TID PRN Estella Husk, MD       magnesium hydroxide (MILK OF MAGNESIA) suspension 30 mL  30 mL Oral Daily PRN [name] A, NP       nicotine (NICODERM CQ - dosed in mg/24 hours) patch 21 mg  21 mg Transdermal Daily [name] A, NP   21 mg at 11/15/20 0929   OLANZapine zydis (ZYPREXA) disintegrating tablet 10 mg  10 mg Oral QHS [name] A, NP   10 mg at 11/14/20 2120   Current Outpatient Medications  Medication Sig Dispense Refill   FLUoxetine (PROZAC) 20 MG capsule Take 1 capsule (20 mg total) by mouth daily. 30 capsule 1   hydrOXYzine (ATARAX/VISTARIL) 25 MG tablet Take 1 tablet (25 mg total) by mouth 3 (three) times daily as needed for anxiety (sleep). 30 tablet 1   nicotine (NICODERM CQ - DOSED IN MG/24 HOURS) 21 mg/24hr patch  Place 1 patch (21 mg total) onto the skin daily. 28 patch 1   OLANZapine zydis (ZYPREXA) 10 MG disintegrating tablet Take 1 tablet (10 mg total) by mouth at bedtime. 30 tablet 1    Labs  Lab Results:  Admission on 11/14/2020  Component Date Value Ref Range Status   RPR Ser Ql 11/14/2020 NON REACTIVE  NON REACTIVE Final   Performed at [facility], [address], [place], [place] [address]   Hepatitis B Surface Ag  11/14/2020 NON REACTIVE  NON REACTIVE Final   HCV Ab 11/14/2020 NON REACTIVE  NON REACTIVE Final   Comment: (NOTE) Nonreactive HCV antibody screen is consistent with no HCV infections,  unless recent infection is suspected or other evidence exists to indicate HCV infection.     Hep A IgM 11/14/2020 NON REACTIVE  NON REACTIVE Final   Hep B C IgM 11/14/2020 NON REACTIVE  NON REACTIVE Final   Performed at [facility], [address], [place], [place] [address]   HIV Screen 4th Generation wRfx 11/14/2020 Non Reactive  Non Reactive Final   Performed at [facility], [address], [place], [place] [address]  Admission on 11/13/2020, Discharged on 11/14/2020  Component Date Value Ref Range Status   Sodium 11/13/2020 136  135 - 145 mmol/L Final   Potassium 11/13/2020 3.8  3.5 - 5.1 mmol/L Final   Chloride 11/13/2020 101  98 - 111 mmol/L Final   CO2 11/13/2020 24  22 - 32 mmol/L Final   Glucose, Bld 11/13/2020 102 (A) 70 - 99 mg/dL Final   Glucose reference range applies only to samples taken after fasting for at least 8 hours.   BUN 11/13/2020 5 (A) 6 - 20 mg/dL Final   Creatinine, Ser 11/13/2020 0.69  0.61 - 1.24 mg/dL Final   Calcium 09/81/1914 9.3  8.9 - 10.3 mg/dL Final   Total Protein 78/29/5621 7.3  6.5 - 8.1 g/dL Final   Albumin 30/86/5784 4.5  3.5 - 5.0 g/dL Final   AST 69/62/9528 21  15 - 41 U/L Final   ALT 11/13/2020 14  0 - 44 U/L Final   Alkaline Phosphatase 11/13/2020 97  38 - 126 U/L Final   Total Bilirubin 11/13/2020 1.3 (A) 0.3 - 1.2 mg/dL  Final   GFR, Estimated 11/13/2020 >60  >60 mL/min Final   Comment: (NOTE) Calculated using the CKD-EPI Creatinine Equation (2021)    Anion gap 11/13/2020 11  5 - 15 Final   Performed at [facility], [address] [place], [place] [address]   Alcohol, Ethyl (B) 11/13/2020 <10  <10 mg/dL Final   Comment: (NOTE) Lowest detectable limit for serum alcohol is 10 mg/dL.  For medical purposes only. Performed at [facility], [address] [place], [place] [address]    WBC 11/13/2020 7.1  4.0 - 10.5 K/uL Final   RBC 11/13/2020 5.02  4.22 - 5.81 MIL/uL Final   Hemoglobin 11/13/2020 16.0  13.0 - 17.0 g/dL Final   HCT 72/53/6644 46.2  39.0 - 52.0 % Final   MCV 11/13/2020 92.0  80.0 - 100.0 fL Final   MCH 11/13/2020 31.9  26.0 - 34.0 pg Final   MCHC 11/13/2020 34.6  30.0 - 36.0 g/dL Final   RDW 03/47/4259 13.6  11.5 - 15.5 % Final   Platelets 11/13/2020 269  150 - 400 K/uL Final   nRBC 11/13/2020 0.0  0.0 - 0.2 % Final   Performed at [facility], [address] [place], [place] [address]   Opiates 11/13/2020 NONE DETECTED  NONE DETECTED Final   Cocaine 11/13/2020 NONE DETECTED  NONE DETECTED Final   Benzodiazepines 11/13/2020 NONE DETECTED  NONE DETECTED Final   Amphetamines 11/13/2020 NONE DETECTED  NONE DETECTED Final   Tetrahydrocannabinol 11/13/2020 NONE DETECTED  NONE DETECTED Final   Barbiturates 11/13/2020 NONE DETECTED  NONE DETECTED Final   Comment: (NOTE) DRUG SCREEN FOR MEDICAL PURPOSES ONLY.  IF CONFIRMATION IS NEEDED FOR  ANY PURPOSE, NOTIFY LAB WITHIN 5 DAYS.  LOWEST DETECTABLE LIMITS FOR URINE DRUG SCREEN Drug Class                     Cutoff (ng/mL) Amphetamine and metabolites    1000 Barbiturate and metabolites    200 Benzodiazepine                 200 Tricyclics and metabolites     300 Opiates and metabolites        300 Cocaine and metabolites        300 THC                            50 Performed at [facility], [address] [place], [place] [address]    Acetaminophen (Tylenol), Serum 11/13/2020 <10 (A) 10 - 30 ug/mL Final   Comment: (NOTE) Therapeutic concentrations vary significantly. A range of 10-30 ug/mL  may be an effective concentration for many patients. However, some  are best treated at concentrations outside of this range. Acetaminophen concentrations >150 ug/mL at 4 hours after ingestion  and >50 ug/mL at 12 hours after ingestion are often associated with  toxic reactions.  Performed at [facility], [address], [place], [place] [address]    Salicylate Lvl 11/13/2020 <7.0 (A) 7.0 - 30.0 mg/dL Final   Performed at [facility], [address] [place], [place] [address]   SARS Coronavirus 2 by RT PCR 11/13/2020 NEGATIVE  NEGATIVE Final   Comment: (NOTE) SARS-CoV-2 target nucleic acids are NOT DETECTED.  The SARS-CoV-2 RNA is generally detectable in upper respiratory specimens during the acute phase of infection. The lowest concentration of SARS-CoV-2 viral copies this assay can detect is 138 copies/mL. A negative result does not preclude SARS-Cov-2 infection and should not be used as the sole basis for treatment or other patient management decisions. A negative result may occur with  improper specimen collection/handling, submission of specimen other than nasopharyngeal swab, presence of viral mutation(s) within the areas targeted by this assay, and inadequate number of viral copies(<138 copies/mL). A negative result must be combined with clinical observations, patient history, and epidemiological information. The expected result is Negative.  Fact Sheet for Patients:  BloggerCourse.com  Fact Sheet for Healthcare Providers:  SeriousBroker.it  This test is no                          t yet approved or cleared by the [place] FDA and  has been authorized for detection and/or diagnosis of SARS-CoV-2 by FDA under an  Emergency Use Authorization (EUA). This EUA will remain  in effect (meaning this test can be used) for the duration of the COVID-19 declaration under Section 564(b)(1) of the Act, 21 U.S.C.section 360bbb-3(b)(1), unless the authorization is terminated  or revoked sooner.       Influenza A by PCR 11/13/2020 NEGATIVE  NEGATIVE Final   Influenza B by PCR 11/13/2020 NEGATIVE  NEGATIVE Final   Comment: (NOTE) The Xpert Xpress SARS-CoV-2/FLU/RSV plus assay is intended as an aid in the diagnosis of influenza from Nasopharyngeal swab specimens and should not be used as a sole basis for treatment. Nasal washings and aspirates are unacceptable for Xpert Xpress SARS-CoV-2/FLU/RSV testing.  Fact Sheet for Patients: BloggerCourse.com  Fact Sheet for Healthcare Providers: SeriousBroker.it  This test is not yet approved or cleared by  the [employer] and has been authorized for detection and/or diagnosis of SARS-CoV-2 by FDA under an Emergency Use Authorization (EUA). This EUA will remain in effect (meaning this test can be used) for the duration of the COVID-19 declaration under Section 564(b)(1) of the Act, 21 U.S.C. section 360bbb-3(b)(1), unless the authorization is terminated or revoked.  Performed at [facility], [address], [place], [place] [address]   Admission on 10/12/2020, Discharged on 10/16/2020  Component Date Value Ref Range Status   Valproic Acid Lvl 10/12/2020 29 (A) 50.0 - 100.0 ug/mL Final   Performed at [facility], [address], [place], [place] [address]  Admission on 10/11/2020, Discharged on 10/12/2020  Component Date Value Ref Range Status   SARS Coronavirus 2 by RT PCR 10/11/2020 NEGATIVE  NEGATIVE Final   Comment: (NOTE) SARS-CoV-2 target nucleic acids are NOT DETECTED.  The SARS-CoV-2 RNA is generally detectable in upper respiratory specimens during the acute phase of  infection. The lowest concentration of SARS-CoV-2 viral copies this assay can detect is 138 copies/mL. A negative result does not preclude SARS-Cov-2 infection and should not be used as the sole basis for treatment or other patient management decisions. A negative result may occur with  improper specimen collection/handling, submission of specimen other than nasopharyngeal swab, presence of viral mutation(s) within the areas targeted by this assay, and inadequate number of viral copies(<138 copies/mL). A negative result must be combined with clinical observations, patient history, and epidemiological information. The expected result is Negative.  Fact Sheet for Patients:  BloggerCourse.com  Fact Sheet for Healthcare Providers:  SeriousBroker.it  This test is no                          t yet approved or cleared by the [place] FDA and  has been authorized for detection and/or diagnosis of SARS-CoV-2 by FDA under an Emergency Use Authorization (EUA). This EUA will remain  in effect (meaning this test can be used) for the duration of the COVID-19 declaration under Section 564(b)(1) of the Act, 21 U.S.C.section 360bbb-3(b)(1), unless the authorization is terminated  or revoked sooner.       Influenza A by PCR 10/11/2020 NEGATIVE  NEGATIVE Final   Influenza B by PCR 10/11/2020 NEGATIVE  NEGATIVE Final   Comment: (NOTE) The Xpert Xpress SARS-CoV-2/FLU/RSV plus assay is intended as an aid in the diagnosis of influenza from Nasopharyngeal swab specimens and should not be used as a sole basis for treatment. Nasal washings and aspirates are unacceptable for Xpert Xpress SARS-CoV-2/FLU/RSV testing.  Fact Sheet for Patients: BloggerCourse.com  Fact Sheet for Healthcare Providers: SeriousBroker.it  This test is not yet approved or cleared by the [place] FDA and has been  authorized for detection and/or diagnosis of SARS-CoV-2 by FDA under an Emergency Use Authorization (EUA). This EUA will remain in effect (meaning this test can be used) for the duration of the COVID-19 declaration under Section 564(b)(1) of the Act, 21 U.S.C. section 360bbb-3(b)(1), unless the authorization is terminated or revoked.  Performed at [facility], [address], [place], [place] [address]    Sodium 10/11/2020 132 (A) 135 - 145 mmol/L Final   Potassium 10/11/2020 3.5  3.5 - 5.1 mmol/L Final   Chloride 10/11/2020 99  98 - 111 mmol/L Final   CO2 10/11/2020 23  22 - 32 mmol/L Final   Glucose, Bld 10/11/2020 134 (A) 70 - 99 mg/dL Final  Glucose reference range applies only to samples taken after fasting for at least 8 hours.   BUN 10/11/2020 11  6 - 20 mg/dL Final   Creatinine, Ser 10/11/2020 0.78  0.61 - 1.24 mg/dL Final   Calcium 32/99/2426 9.0  8.9 - 10.3 mg/dL Final   Total Protein 83/41/9622 6.9  6.5 - 8.1 g/dL Final   Albumin 29/79/8921 4.2  3.5 - 5.0 g/dL Final   AST 19/41/7408 24  15 - 41 U/L Final   ALT 10/11/2020 21  0 - 44 U/L Final   Alkaline Phosphatase 10/11/2020 77  38 - 126 U/L Final   Total Bilirubin 10/11/2020 1.1  0.3 - 1.2 mg/dL Final   GFR, Estimated 10/11/2020 >60  >60 mL/min Final   Comment: (NOTE) Calculated using the CKD-EPI Creatinine Equation (2021)    Anion gap 10/11/2020 10  5 - 15 Final   Performed at [facility], [address] [place], [place] [address]   Alcohol, Ethyl (B) 10/11/2020 <10  <10 mg/dL Final   Comment: (NOTE) Lowest detectable limit for serum alcohol is 10 mg/dL.  For medical purposes only. Performed at [facility], [address], [place], [place] [address]    Opiates 10/11/2020 NONE DETECTED  NONE DETECTED Final   Cocaine 10/11/2020 NONE DETECTED  NONE DETECTED Final   Benzodiazepines 10/11/2020 NONE DETECTED  NONE DETECTED Final   Amphetamines 10/11/2020 NONE DETECTED  NONE DETECTED Final    Tetrahydrocannabinol 10/11/2020 NONE DETECTED  NONE DETECTED Final   Barbiturates 10/11/2020 NONE DETECTED  NONE DETECTED Final   Comment: (NOTE) DRUG SCREEN FOR MEDICAL PURPOSES ONLY.  IF CONFIRMATION IS NEEDED FOR ANY PURPOSE, NOTIFY LAB WITHIN 5 DAYS.  LOWEST DETECTABLE LIMITS FOR URINE DRUG SCREEN Drug Class                     Cutoff (ng/mL) Amphetamine and metabolites    1000 Barbiturate and metabolites    200 Benzodiazepine                 200 Tricyclics and metabolites     300 Opiates and metabolites        300 Cocaine and metabolites        300 THC                            50 Performed at [facility], [address], [place], [place] [address]    WBC 10/11/2020 10.3  4.0 - 10.5 K/uL Final   RBC 10/11/2020 4.48  4.22 - 5.81 MIL/uL Final   Hemoglobin 10/11/2020 13.7  13.0 - 17.0 g/dL Final   HCT 63/78/5885 39.6  39.0 - 52.0 % Final   MCV 10/11/2020 88.4  80.0 - 100.0 fL Final   MCH 10/11/2020 30.6  26.0 - 34.0 pg Final   MCHC 10/11/2020 34.6  30.0 - 36.0 g/dL Final   RDW 02/77/4128 12.6  11.5 - 15.5 % Final   Platelets 10/11/2020 362  150 - 400 K/uL Final   nRBC 10/11/2020 0.0  0.0 - 0.2 % Final   Neutrophils Relative % 10/11/2020 73  % Final   Neutro Abs 10/11/2020 7.4  1.7 - 7.7 K/uL Final   Lymphocytes Relative 10/11/2020 15  % Final   Lymphs Abs 10/11/2020 1.5  0.7 - 4.0 K/uL Final   Monocytes Relative 10/11/2020 10  % Final   Monocytes Absolute 10/11/2020 1.1 (A) 0.1 -  1.0 K/uL Final   Eosinophils Relative 10/11/2020 2  % Final   Eosinophils Absolute 10/11/2020 0.2  0.0 - 0.5 K/uL Final   Basophils Relative 10/11/2020 0  % Final   Basophils Absolute 10/11/2020 0.0  0.0 - 0.1 K/uL Final   Immature Granulocytes 10/11/2020 0  % Final   Abs Immature Granulocytes 10/11/2020 0.04  0.00 - 0.07 K/uL Final   Performed at [facility], [address] [place], [place] [address]   Salicylate Lvl 10/11/2020 <7.0 (A) 7.0 - 30.0 mg/dL Final   Performed at  [facility], [address], [place], [place] [address]   Acetaminophen (Tylenol), Serum 10/11/2020 <10 (A) 10 - 30 ug/mL Final   Comment: (NOTE) Therapeutic concentrations vary significantly. A range of 10-30 ug/mL  may be an effective concentration for many patients. However, some  are best treated at concentrations outside of this range. Acetaminophen concentrations >150 ug/mL at 4 hours after ingestion  and >50 ug/mL at 12 hours after ingestion are often associated with  toxic reactions.  Performed at [facility], [address] [place], [place] [address]   Admission on 10/06/2020, Discharged on 10/07/2020  Component Date Value Ref Range Status   WBC 10/06/2020 7.1  4.0 - 10.5 K/uL Final   RBC 10/06/2020 4.45  4.22 - 5.81 MIL/uL Final   Hemoglobin 10/06/2020 13.7  13.0 - 17.0 g/dL Final   HCT 91/47/8295 40.3  39.0 - 52.0 % Final   MCV 10/06/2020 90.6  80.0 - 100.0 fL Final   MCH 10/06/2020 30.8  26.0 - 34.0 pg Final   MCHC 10/06/2020 34.0  30.0 - 36.0 g/dL Final   RDW 62/13/0865 12.7  11.5 - 15.5 % Final   Platelets 10/06/2020 286  150 - 400 K/uL Final   nRBC 10/06/2020 0.0  0.0 - 0.2 % Final   Performed at [facility], [address], [place], [place] [address]   Salicylate Lvl 10/06/2020 <7.0 (A) 7.0 - 30.0 mg/dL Final   Performed at [facility], [address], [place], [place] [address]   Acetaminophen (Tylenol), Serum 10/06/2020 <10 (A) 10 - 30 ug/mL Final   Comment: (NOTE) Therapeutic concentrations vary significantly. A range of 10-30 ug/mL  may be an effective concentration for many patients. However, some  are best treated at concentrations outside of this range. Acetaminophen concentrations >150 ug/mL at 4 hours after ingestion  and >50 ug/mL at 12 hours after ingestion are often associated with  toxic reactions.  Performed at [facility], [address] [place], [place] [address]     Sodium 10/06/2020 137  135 - 145 mmol/L Final   Potassium 10/06/2020 3.1 (A) 3.5 - 5.1 mmol/L Final   Chloride 10/06/2020 100  98 - 111 mmol/L Final   CO2 10/06/2020 25  22 - 32 mmol/L Final   Glucose, Bld 10/06/2020 104 (A) 70 - 99 mg/dL Final   Glucose reference range applies only to samples taken after fasting for at least 8 hours.   BUN 10/06/2020 12  6 - 20 mg/dL Final   Creatinine, Ser 10/06/2020 0.62  0.61 - 1.24 mg/dL Final   Calcium 44/03/270 8.9  8.9 - 10.3 mg/dL Final   Total Protein 53/66/4403 7.5  6.5 - 8.1 g/dL Final   Albumin 47/42/5956 4.9  3.5 - 5.0 g/dL Final   AST 38/75/6433 26  15 - 41 U/L Final   ALT 10/06/2020 31  0 -  44 U/L Final   Alkaline Phosphatase 10/06/2020 60  38 - 126 U/L Final   Total Bilirubin 10/06/2020 1.3 (A) 0.3 - 1.2 mg/dL Final   GFR, Estimated 10/06/2020 >60  >60 mL/min Final   Comment: (NOTE) Calculated using the CKD-EPI Creatinine Equation (2021)    Anion gap 10/06/2020 12  5 - 15 Final   Performed at [facility], [address], [place], [place] [address]   Alcohol, Ethyl (B) 10/06/2020 <10  <10 mg/dL Final   Comment: (NOTE) Lowest detectable limit for serum alcohol is 10 mg/dL.  For medical purposes only. Performed at [facility] [place] [facility], [address] [place], [place] [address]    SARS Coronavirus 2 by RT PCR 10/06/2020 NEGATIVE  NEGATIVE Final   Comment: (NOTE) SARS-CoV-2 target nucleic acids are NOT DETECTED.  The SARS-CoV-2 RNA is generally detectable in upper respiratory specimens during the acute phase of infection. The lowest concentration of SARS-CoV-2 viral copies this assay can detect is 138 copies/mL. A negative result does not preclude SARS-Cov-2 infection and should not be used as the sole basis for treatment or other patient management decisions. A negative result may occur with  improper specimen collection/handling, submission of specimen other than nasopharyngeal swab, presence of  viral mutation(s) within the areas targeted by this assay, and inadequate number of viral copies(<138 copies/mL). A negative result must be combined with clinical observations, patient history, and epidemiological information. The expected result is Negative.  Fact Sheet for Patients:  BloggerCourse.com  Fact Sheet for Healthcare Providers:  SeriousBroker.it  This test is no                          t yet approved or cleared by the [place] FDA and  has been authorized for detection and/or diagnosis of SARS-CoV-2 by FDA under an Emergency Use Authorization (EUA). This EUA will remain  in effect (meaning this test can be used) for the duration of the COVID-19 declaration under Section 564(b)(1) of the Act, 21 U.S.C.section 360bbb-3(b)(1), unless the authorization is terminated  or revoked sooner.       Influenza A by PCR 10/06/2020 NEGATIVE  NEGATIVE Final   Influenza B by PCR 10/06/2020 NEGATIVE  NEGATIVE Final   Comment: (NOTE) The Xpert Xpress SARS-CoV-2/FLU/RSV plus assay is intended as an aid in the diagnosis of influenza from Nasopharyngeal swab specimens and should not be used as a sole basis for treatment. Nasal washings and aspirates are unacceptable for Xpert Xpress SARS-CoV-2/FLU/RSV testing.  Fact Sheet for Patients: BloggerCourse.com  Fact Sheet for Healthcare Providers: SeriousBroker.it  This test is not yet approved or cleared by the [place] FDA and has been authorized for detection and/or diagnosis of SARS-CoV-2 by FDA under an Emergency Use Authorization (EUA). This EUA will remain in effect (meaning this test can be used) for the duration of the COVID-19 declaration under Section 564(b)(1) of the Act, 21 U.S.C. section 360bbb-3(b)(1), unless the authorization is terminated or revoked.  Performed at [facility], [address], [place], [place] [address]    Opiates 10/07/2020 NONE DETECTED  NONE DETECTED Final   Cocaine 10/07/2020 NONE DETECTED  NONE DETECTED Final   Benzodiazepines 10/07/2020 NONE DETECTED  NONE DETECTED Final   Amphetamines 10/07/2020 POSITIVE (A) NONE DETECTED Final   Tetrahydrocannabinol 10/07/2020 NONE DETECTED  NONE DETECTED Final   Barbiturates 10/07/2020 NONE DETECTED  NONE DETECTED Final   Comment: (NOTE) DRUG SCREEN FOR MEDICAL  PURPOSES ONLY.  IF CONFIRMATION IS NEEDED FOR ANY PURPOSE, NOTIFY LAB WITHIN 5 DAYS.  LOWEST DETECTABLE LIMITS FOR URINE DRUG SCREEN Drug Class                     Cutoff (ng/mL) Amphetamine and metabolites    1000 Barbiturate and metabolites    200 Benzodiazepine                 200 Tricyclics and metabolites     300 Opiates and metabolites        300 Cocaine and metabolites        300 THC                            50 Performed at [facility], [address], [place], [place] [address]   Admission on 09/25/2020, Discharged on 10/01/2020  Component Date Value Ref Range Status   Cholesterol 09/27/2020 161  0 - 200 mg/dL Final   Triglycerides 60/45/4098 156 (A) <150 mg/dL Final   HDL 11/91/4782 52  >40 mg/dL Final   Total CHOL/HDL Ratio 09/27/2020 3.1  RATIO Final   VLDL 09/27/2020 31  0 - 40 mg/dL Final   LDL Cholesterol 09/27/2020 78  0 - 99 mg/dL Final   Comment:        Total Cholesterol/HDL:CHD Risk Coronary Heart Disease Risk Table                     Men   Women  1/2 Average Risk   3.4   3.3  Average Risk       5.0   4.4  2 X Average Risk   9.6   7.1  3 X Average Risk  23.4   11.0        Use the calculated Patient Ratio above and the CHD Risk Table to determine the patient's CHD Risk.        ATP III CLASSIFICATION (LDL):  <100     mg/dL   Optimal  956-213  mg/dL   Near or Above                    Optimal  130-159  mg/dL   Borderline  086-578  mg/dL   High  >469     mg/dL   Very High Performed at [facility], [address] [place], [place] [address]    Hgb A1c MFr Bld 09/27/2020 5.2  4.8 - 5.6 % Final   Comment: (NOTE)         Prediabetes: 5.7 - 6.4         Diabetes: >6.4         Glycemic control for adults with diabetes: <7.0    Mean Plasma Glucose 09/27/2020 103  mg/dL Final   Comment: (NOTE) Performed At: [facility] [address] [place], [place] [id number] Jolene Schimke MD UU:[id number]    WBC 10/01/2020 8.8  4.0 - 10.5 K/uL Final   RBC 10/01/2020 4.64  4.22 - 5.81 MIL/uL Final   Hemoglobin 10/01/2020 14.2  13.0 - 17.0 g/dL Final   HCT 47/42/5956 42.5  39.0 - 52.0 % Final   MCV 10/01/2020 91.6  80.0 - 100.0 fL Final   MCH 10/01/2020 30.6  26.0 - 34.0 pg Final   MCHC 10/01/2020 33.4  30.0 - 36.0 g/dL Final   RDW 38/75/6433 12.9  11.5 - 15.5 %  Final   Platelets 10/01/2020 271  150 - 400 K/uL Final   nRBC 10/01/2020 0.0  0.0 - 0.2 % Final   Neutrophils Relative % 10/01/2020 56  % Final   Neutro Abs 10/01/2020 4.9  1.7 - 7.7 K/uL Final   Lymphocytes Relative 10/01/2020 24  % Final   Lymphs Abs 10/01/2020 2.1  0.7 - 4.0 K/uL Final   Monocytes Relative 10/01/2020 9  % Final   Monocytes Absolute 10/01/2020 0.8  0.1 - 1.0 K/uL Final   Eosinophils Relative 10/01/2020 6  % Final   Eosinophils Absolute 10/01/2020 0.5  0.0 - 0.5 K/uL Final   Basophils Relative 10/01/2020 1  % Final   Basophils Absolute 10/01/2020 0.1  0.0 - 0.1 K/uL Final   Immature Granulocytes 10/01/2020 4  % Final   Abs Immature Granulocytes 10/01/2020 0.33 (A) 0.00 - 0.07 K/uL Final   Performed at [facility], [address], [place], [place] [address]   Sodium 10/01/2020 140  135 - 145 mmol/L Final   Potassium 10/01/2020 4.0  3.5 - 5.1 mmol/L Final   Chloride 10/01/2020 105  98 - 111 mmol/L Final   CO2 10/01/2020 22  22 - 32 mmol/L Final   Glucose, Bld 10/01/2020 90  70 - 99 mg/dL Final   Glucose reference range applies only to samples taken after fasting for at  least 8 hours.   BUN 10/01/2020 16  6 - 20 mg/dL Final   Creatinine, Ser 10/01/2020 0.67  0.61 - 1.24 mg/dL Final   Calcium 57/90/3833 9.2  8.9 - 10.3 mg/dL Final   Total Protein 38/32/9191 6.9  6.5 - 8.1 g/dL Final   Albumin 66/08/43 4.4  3.5 - 5.0 g/dL Final   AST 99/77/4142 32  15 - 41 U/L Final   ALT 10/01/2020 31  0 - 44 U/L Final   Alkaline Phosphatase 10/01/2020 51  38 - 126 U/L Final   Total Bilirubin 10/01/2020 0.5  0.3 - 1.2 mg/dL Final   GFR, Estimated 10/01/2020 >60  >60 mL/min Final   Comment: (NOTE) Calculated using the CKD-EPI Creatinine Equation (2021)    Anion gap 10/01/2020 13  5 - 15 Final   Performed at [facility], [address], [place], [place] [address]   Valproic Acid Lvl 10/01/2020 66  50.0 - 100.0 ug/mL Final   Performed at [facility], [address], [place], [place] [address]  Admission on 09/21/2020, Discharged on 09/25/2020  Component Date Value Ref Range Status   Sodium 09/21/2020 139  135 - 145 mmol/L Final   Potassium 09/21/2020 4.0  3.5 - 5.1 mmol/L Final   Chloride 09/21/2020 103  98 - 111 mmol/L Final   CO2 09/21/2020 25  22 - 32 mmol/L Final   Glucose, Bld 09/21/2020 111 (A) 70 - 99 mg/dL Final   Glucose reference range applies only to samples taken after fasting for at least 8 hours.   BUN 09/21/2020 15  6 - 20 mg/dL Final   Creatinine, Ser 09/21/2020 0.95  0.61 - 1.24 mg/dL Final   Calcium 35/68/6168 9.4  8.9 - 10.3 mg/dL Final   Total Protein 37/29/0211 6.8  6.5 - 8.1 g/dL Final   Albumin 15/52/0802 4.4  3.5 - 5.0 g/dL Final   AST 23/36/1224 15  15 - 41 U/L Final   ALT 09/21/2020 13  0 - 44 U/L Final   Alkaline Phosphatase 09/21/2020 62  38 - 126 U/L Final   Total  Bilirubin 09/21/2020 0.6  0.3 - 1.2 mg/dL Final   GFR, Estimated 09/21/2020 >60  >60 mL/min Final   Comment: (NOTE) Calculated using the CKD-EPI Creatinine Equation (2021)    Anion gap 09/21/2020 11  5 - 15 Final   Performed at [facility], [address], [place], [place] [address]   Alcohol, Ethyl (B) 09/21/2020 <10  <10 mg/dL Final   Comment: (NOTE) Lowest detectable limit for serum alcohol is 10 mg/dL.  For medical purposes only. Performed at [facility], [address], [place], [place] [address]    Salicylate Lvl 09/21/2020 <7.0 (A) 7.0 - 30.0 mg/dL Final   Performed at [facility], [address] [place], [place] [address]   Acetaminophen (Tylenol), Serum 09/21/2020 <10 (A) 10 - 30 ug/mL Final   Comment: (NOTE) Therapeutic concentrations vary significantly. A range of 10-30 ug/mL  may be an effective concentration for many patients. However, some  are best treated at concentrations outside of this range. Acetaminophen concentrations >150 ug/mL at 4 hours after ingestion  and >50 ug/mL at 12 hours after ingestion are often associated with  toxic reactions.  Performed at [facility], [address], [place], [place] [address]    WBC 09/21/2020 10.5  4.0 - 10.5 K/uL Final   RBC 09/21/2020 4.58  4.22 - 5.81 MIL/uL Final   Hemoglobin 09/21/2020 13.9  13.0 - 17.0 g/dL Final   HCT 96/29/5284 40.0  39.0 - 52.0 % Final   MCV 09/21/2020 87.3  80.0 - 100.0 fL Final   MCH 09/21/2020 30.3  26.0 - 34.0 pg Final   MCHC 09/21/2020 34.8  30.0 - 36.0 g/dL Final   RDW 13/24/4010 12.3  11.5 - 15.5 % Final   Platelets 09/21/2020 292  150 - 400 K/uL Final   nRBC 09/21/2020 0.0  0.0 - 0.2 % Final   Performed at [facility], [address], [place], [place] [address]   Opiates 09/21/2020 NONE DETECTED  NONE DETECTED Final   Cocaine 09/21/2020 NONE DETECTED  NONE DETECTED Final   Benzodiazepines 09/21/2020 NONE DETECTED  NONE DETECTED Final   Amphetamines 09/21/2020 NONE DETECTED  NONE DETECTED Final   Tetrahydrocannabinol 09/21/2020 NONE DETECTED  NONE DETECTED Final   Barbiturates 09/21/2020 NONE DETECTED  NONE DETECTED Final   Comment: (NOTE) DRUG SCREEN FOR MEDICAL PURPOSES ONLY.   IF CONFIRMATION IS NEEDED FOR ANY PURPOSE, NOTIFY LAB WITHIN 5 DAYS.  LOWEST DETECTABLE LIMITS FOR URINE DRUG SCREEN Drug Class                     Cutoff (ng/mL) Amphetamine and metabolites    1000 Barbiturate and metabolites    200 Benzodiazepine                 200 Tricyclics and metabolites     300 Opiates and metabolites        300 Cocaine and metabolites        300 THC                            50 Performed at [facility], [address], [place], [place] [address]    SARS Coronavirus 2 by RT PCR 09/21/2020 NEGATIVE  NEGATIVE Final   Comment: (NOTE) SARS-CoV-2 target nucleic acids are NOT DETECTED.  The SARS-CoV-2 RNA is generally detectable in upper respiratory specimens during the acute phase  of infection. The lowest concentration of SARS-CoV-2 viral copies this assay can detect is 138 copies/mL. A negative result does not preclude SARS-Cov-2 infection and should not be used as the sole basis for treatment or other patient management decisions. A negative result may occur with  improper specimen collection/handling, submission of specimen other than nasopharyngeal swab, presence of viral mutation(s) within the areas targeted by this assay, and inadequate number of viral copies(<138 copies/mL). A negative result must be combined with clinical observations, patient history, and epidemiological information. The expected result is Negative.  Fact Sheet for Patients:  BloggerCourse.com  Fact Sheet for Healthcare Providers:  SeriousBroker.it  This test is no                          t yet approved or cleared by the [place] FDA and  has been authorized for detection and/or diagnosis of SARS-CoV-2 by FDA under an Emergency Use Authorization (EUA). This EUA will remain  in effect (meaning this test can be used) for the duration of the COVID-19 declaration under Section 564(b)(1) of the Act, 21 U.S.C.section  360bbb-3(b)(1), unless the authorization is terminated  or revoked sooner.       Influenza A by PCR 09/21/2020 NEGATIVE  NEGATIVE Final   Influenza B by PCR 09/21/2020 NEGATIVE  NEGATIVE Final   Comment: (NOTE) The Xpert Xpress SARS-CoV-2/FLU/RSV plus assay is intended as an aid in the diagnosis of influenza from Nasopharyngeal swab specimens and should not be used as a sole basis for treatment. Nasal washings and aspirates are unacceptable for Xpert Xpress SARS-CoV-2/FLU/RSV testing.  Fact Sheet for Patients: BloggerCourse.com  Fact Sheet for Healthcare Providers: SeriousBroker.it  This test is not yet approved or cleared by the [place] FDA and has been authorized for detection and/or diagnosis of SARS-CoV-2 by FDA under an Emergency Use Authorization (EUA). This EUA will remain in effect (meaning this test can be used) for the duration of the COVID-19 declaration under Section 564(b)(1) of the Act, 21 U.S.C. section 360bbb-3(b)(1), unless the authorization is terminated or revoked.  Performed at [facility], [address] [place], [place] [address]    Color, Urine 09/21/2020 YELLOW  YELLOW Final   APPearance 09/21/2020 CLEAR  CLEAR Final   Specific Gravity, Urine 09/21/2020 1.014  1.005 - 1.030 Final   pH 09/21/2020 7.0  5.0 - 8.0 Final   Glucose, UA 09/21/2020 NEGATIVE  NEGATIVE mg/dL Final   Hgb urine dipstick 09/21/2020 NEGATIVE  NEGATIVE Final   Bilirubin Urine 09/21/2020 NEGATIVE  NEGATIVE Final   Ketones, ur 09/21/2020 NEGATIVE  NEGATIVE mg/dL Final   Protein, ur 60/45/4098 NEGATIVE  NEGATIVE mg/dL Final   Nitrite 11/91/4782 NEGATIVE  NEGATIVE Final   Leukocytes,Ua 09/21/2020 NEGATIVE  NEGATIVE Final   Performed at [facility], [address], [place], [place] [address]   Lithium Lvl 09/23/2020 0.39 (A) 0.60 - 1.20 mmol/L Final   Performed at [facility], [address], [place],  [place] [address]   Sodium 09/23/2020 140  135 - 145 mmol/L Final   Potassium 09/23/2020 3.9  3.5 - 5.1 mmol/L Final   Chloride 09/23/2020 105  98 - 111 mmol/L Final   BUN 09/23/2020 14  6 - 20 mg/dL Final   Creatinine, Ser 09/23/2020 0.80  0.61 - 1.24 mg/dL Final   Glucose, Bld 78/46/9629 96  70 - 99 mg/dL Final   Glucose reference range applies only to samples taken  after fasting for at least 8 hours.   Calcium, Ion 09/23/2020 1.22  1.15 - 1.40 mmol/L Final   TCO2 09/23/2020 26  22 - 32 mmol/L Final   Hemoglobin 09/23/2020 14.3  13.0 - 17.0 g/dL Final   HCT 78/29/5621 42.0  39.0 - 52.0 % Final   Sodium 09/23/2020 138  135 - 145 mmol/L Final   Potassium 09/23/2020 4.1  3.5 - 5.1 mmol/L Final   Chloride 09/23/2020 105  98 - 111 mmol/L Final   CO2 09/23/2020 25  22 - 32 mmol/L Final   Glucose, Bld 09/23/2020 99  70 - 99 mg/dL Final   Glucose reference range applies only to samples taken after fasting for at least 8 hours.   BUN 09/23/2020 13  6 - 20 mg/dL Final   Creatinine, Ser 09/23/2020 0.91  0.61 - 1.24 mg/dL Final   Calcium 30/86/5784 9.2  8.9 - 10.3 mg/dL Final   Total Protein 69/62/9528 6.2 (A) 6.5 - 8.1 g/dL Final   Albumin 41/32/4401 4.0  3.5 - 5.0 g/dL Final   AST 02/72/5366 13 (A) 15 - 41 U/L Final   ALT 09/23/2020 11  0 - 44 U/L Final   Alkaline Phosphatase 09/23/2020 49  38 - 126 U/L Final   Total Bilirubin 09/23/2020 0.7  0.3 - 1.2 mg/dL Final   GFR, Estimated 09/23/2020 >60  >60 mL/min Final   Comment: (NOTE) Calculated using the CKD-EPI Creatinine Equation (2021)    Anion gap 09/23/2020 8  5 - 15 Final   Performed at [facility], [address] [place], [place] [address]   WBC 09/23/2020 9.6  4.0 - 10.5 K/uL Final   RBC 09/23/2020 4.84  4.22 - 5.81 MIL/uL Final   Hemoglobin 09/23/2020 14.8  13.0 - 17.0 g/dL Final   HCT 74/25/9563 43.4  39.0 - 52.0 % Final   MCV 09/23/2020 89.7  80.0 - 100.0 fL Final   MCH 09/23/2020 30.6  26.0 - 34.0 pg Final   MCHC 09/23/2020  34.1  30.0 - 36.0 g/dL Final   RDW 87/56/4332 12.4  11.5 - 15.5 % Final   Platelets 09/23/2020 291  150 - 400 K/uL Final   nRBC 09/23/2020 0.0  0.0 - 0.2 % Final   Performed at [facility], [address], [place], [place] [address]   Glucose-Capillary 09/23/2020 100 (A) 70 - 99 mg/dL Final   Glucose reference range applies only to samples taken after fasting for at least 8 hours.   Comment 1 09/23/2020 Notify RN   Final   Troponin I (High Sensitivity) 09/23/2020 3  <18 ng/L Final   Comment: (NOTE) Elevated high sensitivity troponin I (hsTnI) values and significant  changes across serial measurements may suggest ACS but many other  chronic and acute conditions are known to elevate hsTnI results.  Refer to the "Links" section for chest pain algorithms and additional  guidance. Performed at [facility], [address], [place], [place] [address]    Magnesium 09/23/2020 2.3  1.7 - 2.4 mg/dL Final   Performed at [facility], [address], [place], [place] [address]   Phosphorus 09/23/2020 3.5  2.5 - 4.6 mg/dL Final   Performed at [facility], [address], [place], [place] [address]   TSH 09/23/2020 1.957  0.350 - 4.500 uIU/mL Final   Comment: Performed by a 3rd Generation assay with a functional sensitivity of <=0.01 uIU/mL. Performed at [facility],  [address] [place], [place] [address]    T3, Free 09/23/2020 3.3  2.0 - 4.4 pg/mL Final   Comment: (NOTE) Performed At: [facility] [address] [place], [place] [id number] Jolene Schimke MD AO:[id number]    SARS Coronavirus 2 09/24/2020 NEGATIVE  NEGATIVE Final   Comment: (NOTE) SARS-CoV-2 target nucleic acids are NOT DETECTED.  The SARS-CoV-2 RNA is generally detectable in upper and lower respiratory specimens during the acute phase of infection. Negative results do not preclude SARS-CoV-2 infection, do not rule out co-infections with other pathogens, and should not be used as  the sole basis for treatment or other patient management decisions. Negative results must be combined with clinical observations, patient history, and epidemiological information. The expected result is Negative.  Fact Sheet for Patients: HairSlick.no  Fact Sheet for Healthcare Providers: quierodirigir.com  This test is not yet approved or cleared by the [place] FDA and  has been authorized for detection and/or diagnosis of SARS-CoV-2 by FDA under an Emergency Use Authorization (EUA). This EUA will remain  in effect (meaning this test can be used) for the duration of the COVID-19 declaration under Se                          ction 564(b)(1) of the Act, 21 U.S.C. section 360bbb-3(b)(1), unless the authorization is terminated or revoked sooner.  Performed at [facility], [address], [place], [place] [address]     Blood Alcohol level:  Lab Results  Component Value Date   ETH <10 11/13/2020   ETH <10 10/11/2020    Metabolic Disorder Labs: Lab Results  Component Value Date   HGBA1C 5.2 09/27/2020   MPG 103 09/27/2020   MPG 102.54 01/24/2020   No results found for: PROLACTIN Lab Results  Component Value Date   CHOL 161 09/27/2020   TRIG 156 (H) 09/27/2020   HDL 52 09/27/2020   CHOLHDL 3.1 09/27/2020   VLDL 31 09/27/2020   LDLCALC 78 09/27/2020   LDLCALC 120 (H) 01/24/2020    Therapeutic Lab Levels: Lab Results  Component Value Date   LITHIUM 0.39 (L) 09/23/2020   LITHIUM 0.38 (L) 01/27/2020   Lab Results  Component Value Date   VALPROATE 29 (L) 10/12/2020   VALPROATE 66 10/01/2020   No components found for:  CBMZ  Physical Findings   AIMS    Flowsheet Row [facility]  AIMS Total Score 0      AUDIT    Flowsheet Row [facility]  [facility]  Alcohol Use Disorder Identification Test Final Score (AUDIT) 0 8      Flowsheet Row ED from 11/14/2020 in [facility] ED from 11/13/2020 in [facility] ED from 10/27/2020 in [facility]  C-SSRS RISK CATEGORY High Risk High Risk Error: Question 6 not populated        Musculoskeletal  Strength & Muscle Tone: within normal limits Gait & Station: normal Patient leans: N/A  Psychiatric Specialty Exam  Presentation  General Appearance: Appropriate for Environment; Casual, Dressed in [employer]  Eye Contact:Good  Speech:Clear and Coherent (occasionally rambling)  Speech Volume:Normal  Handedness: Not assessed   Mood and Affect  Mood: "I've been sleeping most of the time"  Affect:Congruent, Cooperative, Stable  Thought Process  Thought Processes:Coherent; Goal Directed  Descriptions of Associations: Intact  Orientation:Full (Time, Place and Person)  Thought Content:Logical  Diagnosis of Schizophrenia or Schizoaffective disorder in past: No    Hallucinations: None  Ideas of Reference:None  Suicidal Thoughts: Fleeting  Homicidal Thoughts:Homicidal Thoughts: No   Sensorium  Memory:Immediate Good  Judgment:Poor  Insight:Good   Executive Functions  Concentration:Good  Attention Span:Good  Recall:Good  Fund of Knowledge:Good  Language:Good   Psychomotor Activity  Psychomotor Activity:Psychomotor Activity: Normal   Assets  Assets:Desire for Improvement; Physical Health; Communication Skills   Sleep  Sleep:Sleep: Fair   Nutritional Assessment (For OBS and FBC admissions only) Has the patient had a weight loss or gain of 10 pounds or more in the last 3 months?: No Has the patient had a decrease in food intake/or appetite?: No Does the patient have dental  problems?: No Does the patient have eating habits or behaviors that may be indicators of an eating disorder including binging or inducing vomiting?: No Has the patient recently lost weight without trying?: 0 Has the patient been eating poorly because of a decreased appetite?: 0 Malnutrition Screening Tool Score: 0   Physical Exam  Physical Exam Vitals reviewed.  Constitutional:      General: He is not in acute distress.    Appearance: He is not ill-appearing or toxic-appearing.  HENT:     Head: Normocephalic and atraumatic.     Right Ear: External ear normal.     Left Ear: External ear normal.     Nose: Nose normal.  Eyes:     General: No scleral icterus.    Extraocular Movements: Extraocular movements intact.     Conjunctiva/sclera: Conjunctivae normal.  Pulmonary:     Effort: Pulmonary effort is normal.  Musculoskeletal:        General: Normal range of motion.     Cervical back: Normal range of motion.  Neurological:     General: No focal deficit present.     Mental Status: He is alert.   Review of Systems  Constitutional:  Positive for malaise/fatigue. Negative for chills and fever.  HENT:  Negative for hearing loss.   Eyes:  Negative for discharge and redness.  Respiratory:  Negative for cough.   Cardiovascular:  Negative for chest pain.  Gastrointestinal:  Negative for abdominal pain.  Musculoskeletal:  Negative for myalgias.  Neurological:  Negative for headaches.  All other systems reviewed and are negative. Blood pressure 111/74, pulse 76, temperature (!) 97.1 F (36.2 C), temperature source Tympanic, resp. rate 18, height 5\' 10"  (1.778 m), weight 74.8 kg, SpO2 100 %. Body mass index is 23.66 kg/m.  Treatment Plan Summary: Daily contact with patient to assess and evaluate symptoms and progress in treatment  Kery is a 35 y.o. male with a history of Bipolar I disorder and polysubstance use admitted to the [facility] from [facility] due to [facility] with plan to jump in front of a  train. Patient is agreeable to [facility] admission. Labs ordered and reviewed - CMP, CBC grossly normal; EtOH, UDS, Resp panel negative; RPR, Hepatitis panel, HIV non-reactive; EKG wnl. Patient fits criteria for bipolar disorder with manic episodes, depressive episodes, and psychotic symptoms - these episodes do occur outside of periods of substance use per history (although manic episodes are less intense and less frequent - unclear if he meets criteria for bipolar I or II). Family history is also concerning for major mental illness. He appears to be in a mixed episode on admission.  He also has a history of substance-induced manic episodes. Due to his current living situation and his prior negative experience with lithium, it is better to use antipsychotics for treatment. Patient remains appropriate for treatment at [facility].    Bipolar I disorder - Olanzapine  qhs - initiate Prozac  daily   Substance use - Nicoderm CQ /16hr patch - Accepted to [facility] residential rehab. Will start on 9/19 at 0900   Dispo - Going to [place] residential program on 9/19 at 0900  Estella Husk, MD 11/15/2020 6:39 PM

## 2020-11-15 NOTE — ED Notes (Signed)
Snacks given - 3 nutrigrain bars and milk 

## 2020-11-15 NOTE — Progress Notes (Signed)
Pt is awake, alert and oriented. Pt did not voice any complaints of pain or discomfort. No distress noted. Pt denies current SI/HI/AVH. Staff will monitor for pt's safety. 

## 2020-11-15 NOTE — ED Notes (Signed)
Pt A&O x 4, no distress noted, calm & cooperative.  Watching TV at present.  Monitoring for safety. 

## 2020-11-15 NOTE — ED Notes (Signed)
Patient denies pain and is resting comfortably.  

## 2020-11-15 NOTE — Progress Notes (Signed)
Pt is currently watching Tv. Pt attended [facility] group therapy and participated. No distress noted. Pt is visible in the milieu and has been appropriate. No concern voiced. Pt's safety is maintained.

## 2020-11-15 NOTE — Group Note (Signed)
Group Topic: Balance in Life  Group Date: 11/15/2020 Start Time: 1000 End Time: 1100 Facilitators: Mart Piggs, NT  Department: [facility]  Number of Participants: 4  Group Focus: affirmation Treatment Modality:  Behavior Modification Therapy Interventions utilized were group exercise Purpose: enhance coping skills  The group was based around mindfulness and meditation. We had open ended discussion, I am big on letting the group lead or change topic based off the need of the people attending the group as long as it is appropriate. The four that did attended, all participate, and added great information to the session. Some open up about personal experiences and thing that bother them. We gathered new information that we could use to make life better. The group was great.. we all really enjoyed and gained insight from one anther. We encouraged each other on how to do thing to; self improve our life with writing out our goals and sticking to them. Self esteem, stress management, and taking time out to calm the brain and center ourself.   Name: Brett House Date of Birth: 05-27-1985  MR: [id number]    Level of Participation: active Quality of Participation: attentive, responsive Interactions with others: gave feedback Mood/Affect: appropriate, bright, and positive Triggers (if applicable):   Cognition: concrete, goal directed, and insightful Progress: Moderate Response: He was excited about getting into a place. He has a plan.  Plan: patient will be encouraged to stick with the plan no matter how hard it gets. Make small goals, keep the bigger picture in mind and start to meditate.   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

## 2020-11-15 NOTE — ED Notes (Signed)
Pt sleeping; no distress noted

## 2020-11-15 NOTE — Group Note (Signed)
Group Topic: Social Support  Group Date: 11/15/2020 Start Time: 1930 End Time: 1955 Facilitators: Armandina Stammer, [place]  Department: [facility]  Number of Participants: 3  Group Focus: check in Treatment Modality:  Cognitive Behavioral Therapy Interventions utilized were clarification, exploration, group exercise, story telling, and support Purpose: explore maladaptive thinking, express feelings, increase insight, and relapse prevention strategies  Name: Brett House Date of Birth: [birth year]  MR: [id number]    Level of Participation: moderate Quality of Participation: attentive, engaged, motivated, and supportive Interactions with others: gave feedback Mood/Affect: appropriate and bright Triggers (if applicable): n/a Cognition: goal directed, insightful, and logical Progress: Moderate Response: n/a Plan: follow-up needed  Patients Problems:  Patient Active Problem List   Diagnosis Date Noted   Bipolar 1 disorder (HCC) 11/14/2020   Bipolar 1 disorder, depressed, severe (HCC) 10/12/2020   Suicidal thoughts    Substance induced mood disorder (HCC) 10/07/2020   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

## 2020-11-16 DIAGNOSIS — F319 Bipolar disorder, unspecified: Secondary | ICD-10-CM | POA: Diagnosis not present

## 2020-11-16 DIAGNOSIS — F419 Anxiety disorder, unspecified: Secondary | ICD-10-CM | POA: Diagnosis not present

## 2020-11-16 DIAGNOSIS — F17210 Nicotine dependence, cigarettes, uncomplicated: Secondary | ICD-10-CM | POA: Diagnosis not present

## 2020-11-16 DIAGNOSIS — F1290 Cannabis use, unspecified, uncomplicated: Secondary | ICD-10-CM | POA: Diagnosis not present

## 2020-11-16 MED ORDER — ENSURE ENLIVE PO LIQD
237.0000 mL | Freq: Two times a day (BID) | ORAL | Status: DC
  Administered 2020-11-16 – 2020-11-19 (×5): 237 mL via ORAL

## 2020-11-16 MED ORDER — HYDROXYZINE HCL 25 MG PO TABS
25.0000 mg | ORAL_TABLET | Freq: Three times a day (TID) | ORAL | Status: DC | PRN
Start: 2020-11-16 — End: 2020-11-19

## 2020-11-16 MED ORDER — HYDROXYZINE HCL 25 MG PO TABS
50.0000 mg | ORAL_TABLET | Freq: Every evening | ORAL | Status: DC | PRN
  Administered 2020-11-16 – 2020-11-17 (×2): 50 mg via ORAL
  Filled 2020-11-16 (×2): qty 2

## 2020-11-16 MED ORDER — ALBUTEROL SULFATE HFA 108 (90 BASE) MCG/ACT IN AERS
1.0000 | INHALATION_SPRAY | RESPIRATORY_TRACT | Status: DC | PRN
  Filled 2020-11-16: qty 6.7

## 2020-11-16 NOTE — ED Notes (Signed)
Pt given breakfast.

## 2020-11-16 NOTE — ED Notes (Signed)
Pt given lunch

## 2020-11-16 NOTE — Group Note (Signed)
Group Topic: Relapse and Recovery  Group Date: 11/16/2020 Start Time: 1345 End Time: 1415 Facilitators: Loma Newton  Department: [facility]  Number of Participants: 2  Group Focus: goals/reality orientation Treatment Modality:  Patient-Centered Therapy Interventions utilized were support Purpose: express feelings, and identify setbacks in recovery and future goals.  Name: Brett House Date of Birth: 02-25-1986  MR: [id number]    Level of Participation: active Quality of Participation: attentive Interactions with others: gave feedback Mood/Affect: appropriate Triggers (if applicable): n/a Cognition: coherent/clear Progress: Gaining insight Response: positive response to feedback. Plan: patient will be encouraged to continue his sobriety and write down his goals and track his progress.  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

## 2020-11-16 NOTE — ED Notes (Signed)
Brett House slept undisturb and without complaint or distress

## 2020-11-16 NOTE — ED Notes (Signed)
Pt A&O x 4, interactive with staff, no distress noted.  Calm & cooperative.  Monitoring for safety.

## 2020-11-16 NOTE — ED Provider Notes (Signed)
Behavioral Health Progress Note  Date and Time: 11/16/2020 11:12 AM Name: Brett House MRN:  [id number]  Subjective:   Patient seen and chart reviewed. He has been medication compliant. Patient describes his mood as "good" and states that he slept "pretty good" although requests that prn qhs dose of vistaril be increased due to ongoing difficulties with insomnia. He states that his appetite is "good" and requests ensure. He denies SI/HI/AVH. He states that he is looking forward to going to [facility] on Monday and states that his plan is go to a half way house after completing rehab at [facility] and get a job. He states that he has been experiencing some wheezing and requests an albuterol inhaler; per chart review patient has a history of asthma. He states that he has been attending groups and expresses that they have been helpful.  He denies medication SE/AE.   Diagnosis:  Final diagnoses:  Bipolar I disorder (HCC)    Total Time spent with patient: 20 minutes  Past Psychiatric History: see H&P Past Medical History:  Past Medical History:  Diagnosis Date   Anxiety    Asthma    Bipolar disorder (HCC)    Depression    No past surgical history on file. Family History: No family history on file. Family Psychiatric  History: see H&P Social History:  Social History   Substance and Sexual Activity  Alcohol Use Yes   Comment: 6 pack once a week for last 8-9 months     Social History   Substance and Sexual Activity  Drug Use Yes   Types: Marijuana   Comment: rarely    Social History   Socioeconomic History   Marital status: Single    Spouse name: Not on file   Number of children: 0   Years of education: Not on file   Highest education level: 12th grade  Occupational History   Occupation: Unemployed  Tobacco Use   Smoking status: Every Day    Packs/day: 1.50    Years: 15.00    Pack years: 22.50    Types: Cigarettes   Smokeless tobacco: Current  Vaping Use   Vaping Use: Never  used  Substance and Sexual Activity   Alcohol use: Yes    Comment: 6 pack once a week for last 8-9 months   Drug use: Yes    Types: Marijuana    Comment: rarely   Sexual activity: Yes  Other Topics Concern   Not on file  Social History Narrative   Not on file   Social Determinants of Health   Financial Resource Strain: Not on file  Food Insecurity: Not on file  Transportation Needs: Not on file  Physical Activity: Not on file  Stress: Not on file  Social Connections: Not on file   SDOH:  SDOH Screenings   Alcohol Screen: Low Risk    Last Alcohol Screening Score (AUDIT): 0  Depression (PHQ2-9): Not on file  Financial Resource Strain: Not on file  Food Insecurity: Not on file  Housing: Not on file  Physical Activity: Not on file  Social Connections: Not on file  Stress: Not on file  Tobacco Use: High Risk   Smoking Tobacco Use: Every Day   Smokeless Tobacco Use: Current  Transportation Needs: Not on file   Additional Social History:                         Sleep: Fair  Appetite:  Good  Current Medications:  Current Facility-Administered Medications  Medication Dose Route Frequency Provider Last Rate Last Admin   acetaminophen (TYLENOL) tablet 650 mg  650 mg Oral Q6H PRN [name] A, NP       albuterol (VENTOLIN HFA) 108 (90 Base) MCG/ACT inhaler 1-2 puff  1-2 puff Inhalation Q4H PRN Estella Husk, MD       alum & mag hydroxide-simeth (MAALOX/MYLANTA) 200-200-20 MG/5ML suspension 30 mL  30 mL Oral Q4H PRN [name] A, NP       feeding supplement (ENSURE ENLIVE / ENSURE PLUS) liquid 237 mL  237 mL Oral BID BM Estella Husk, MD   237 mL at 11/16/20 1017   FLUoxetine (PROZAC) capsule 20 mg  20 mg Oral Daily Estella Husk, MD   20 mg at 11/16/20 1001   hydrOXYzine (ATARAX/VISTARIL) tablet 25 mg  25 mg Oral TID PRN Estella Husk, MD       hydrOXYzine (ATARAX/VISTARIL) tablet 50 mg  50 mg Oral QHS PRN Estella Husk, MD        magnesium hydroxide (MILK OF MAGNESIA) suspension 30 mL  30 mL Oral Daily PRN [name] A, NP       nicotine (NICODERM CQ - dosed in mg/24 hours) patch 21 mg  21 mg Transdermal Daily [name] A, NP   21 mg at 11/16/20 1002   OLANZapine zydis (ZYPREXA) disintegrating tablet 10 mg  10 mg Oral QHS [name] A, NP   10 mg at 11/15/20 2105   Current Outpatient Medications  Medication Sig Dispense Refill   FLUoxetine (PROZAC) 20 MG capsule Take 1 capsule (20 mg total) by mouth daily. 30 capsule 1   hydrOXYzine (ATARAX/VISTARIL) 25 MG tablet Take 1 tablet (25 mg total) by mouth 3 (three) times daily as needed for anxiety (sleep). 30 tablet 1   nicotine (NICODERM CQ - DOSED IN MG/24 HOURS) 21 mg/24hr patch Place 1 patch (21 mg total) onto the skin daily. 28 patch 1   OLANZapine zydis (ZYPREXA) 10 MG disintegrating tablet Take 1 tablet (10 mg total) by mouth at bedtime. 30 tablet 1    Labs  Lab Results:  Admission on 11/14/2020  Component Date Value Ref Range Status   RPR Ser Ql 11/14/2020 NON REACTIVE  NON REACTIVE Final   Performed at [facility], [address], [place], [place] [address]   Hepatitis B Surface Ag 11/14/2020 NON REACTIVE  NON REACTIVE Final   HCV Ab 11/14/2020 NON REACTIVE  NON REACTIVE Final   Comment: (NOTE) Nonreactive HCV antibody screen is consistent with no HCV infections,  unless recent infection is suspected or other evidence exists to indicate HCV infection.     Hep A IgM 11/14/2020 NON REACTIVE  NON REACTIVE Final   Hep B C IgM 11/14/2020 NON REACTIVE  NON REACTIVE Final   Performed at [facility], [address], [place], [place] [address]   HIV Screen 4th Generation wRfx 11/14/2020 Non Reactive  Non Reactive Final   Performed at [facility], [address], [place], [place] [address]  Admission on 11/13/2020, Discharged on 11/14/2020  Component Date Value Ref Range Status   Sodium 11/13/2020 136  135 - 145 mmol/L Final    Potassium 11/13/2020 3.8  3.5 - 5.1 mmol/L Final   Chloride 11/13/2020 101  98 - 111 mmol/L Final   CO2 11/13/2020 24  22 - 32 mmol/L Final   Glucose, Bld 11/13/2020 102 (A) 70 - 99  mg/dL Final   Glucose reference range applies only to samples taken after fasting for at least 8 hours.   BUN 11/13/2020 5 (A) 6 - 20 mg/dL Final   Creatinine, Ser 11/13/2020 0.69  0.61 - 1.24 mg/dL Final   Calcium 16/12/9602 9.3  8.9 - 10.3 mg/dL Final   Total Protein 54/11/8117 7.3  6.5 - 8.1 g/dL Final   Albumin 14/78/2956 4.5  3.5 - 5.0 g/dL Final   AST 21/30/8657 21  15 - 41 U/L Final   ALT 11/13/2020 14  0 - 44 U/L Final   Alkaline Phosphatase 11/13/2020 97  38 - 126 U/L Final   Total Bilirubin 11/13/2020 1.3 (A) 0.3 - 1.2 mg/dL Final   GFR, Estimated 11/13/2020 >60  >60 mL/min Final   Comment: (NOTE) Calculated using the CKD-EPI Creatinine Equation (2021)    Anion gap 11/13/2020 11  5 - 15 Final   Performed at [facility], [address], [place], [place] [address]   Alcohol, Ethyl (B) 11/13/2020 <10  <10 mg/dL Final   Comment: (NOTE) Lowest detectable limit for serum alcohol is 10 mg/dL.  For medical purposes only. Performed at [facility], [address], [place], [place] [address]    WBC 11/13/2020 7.1  4.0 - 10.5 K/uL Final   RBC 11/13/2020 5.02  4.22 - 5.81 MIL/uL Final   Hemoglobin 11/13/2020 16.0  13.0 - 17.0 g/dL Final   HCT 41/32/4401 46.2  39.0 - 52.0 % Final   MCV 11/13/2020 92.0  80.0 - 100.0 fL Final   MCH 11/13/2020 31.9  26.0 - 34.0 pg Final   MCHC 11/13/2020 34.6  30.0 - 36.0 g/dL Final   RDW 02/72/5366 13.6  11.5 - 15.5 % Final   Platelets 11/13/2020 269  150 - 400 K/uL Final   nRBC 11/13/2020 0.0  0.0 - 0.2 % Final   Performed at [facility], [address], [place], [place] [address]   Opiates 11/13/2020 NONE DETECTED  NONE DETECTED Final   Cocaine 11/13/2020 NONE DETECTED  NONE DETECTED Final   Benzodiazepines 11/13/2020 NONE DETECTED  NONE DETECTED  Final   Amphetamines 11/13/2020 NONE DETECTED  NONE DETECTED Final   Tetrahydrocannabinol 11/13/2020 NONE DETECTED  NONE DETECTED Final   Barbiturates 11/13/2020 NONE DETECTED  NONE DETECTED Final   Comment: (NOTE) DRUG SCREEN FOR MEDICAL PURPOSES ONLY.  IF CONFIRMATION IS NEEDED FOR ANY PURPOSE, NOTIFY LAB WITHIN 5 DAYS.  LOWEST DETECTABLE LIMITS FOR URINE DRUG SCREEN Drug Class                     Cutoff (ng/mL) Amphetamine and metabolites    1000 Barbiturate and metabolites    200 Benzodiazepine                 200 Tricyclics and metabolites     300 Opiates and metabolites        300 Cocaine and metabolites        300 THC                            50 Performed at [facility], [address] [place], [place] [address]    Acetaminophen (Tylenol), Serum 11/13/2020 <10 (A) 10 - 30 ug/mL Final   Comment: (NOTE) Therapeutic concentrations vary significantly. A range of 10-30 ug/mL  may be an effective concentration for many patients. However, some  are best treated  at concentrations outside of this range. Acetaminophen concentrations >150 ug/mL at 4 hours after ingestion  and >50 ug/mL at 12 hours after ingestion are often associated with  toxic reactions.  Performed at [facility], [address] [place], [place] [address]    Salicylate Lvl 11/13/2020 <7.0 (A) 7.0 - 30.0 mg/dL Final   Performed at [facility], [address], [place], [place] [address]   SARS Coronavirus 2 by RT PCR 11/13/2020 NEGATIVE  NEGATIVE Final   Comment: (NOTE) SARS-CoV-2 target nucleic acids are NOT DETECTED.  The SARS-CoV-2 RNA is generally detectable in upper respiratory specimens during the acute phase of infection. The lowest concentration of SARS-CoV-2 viral copies this assay can detect is 138 copies/mL. A negative result does not preclude SARS-Cov-2 infection and should not be used as the sole basis for treatment or other patient management decisions. A negative  result may occur with  improper specimen collection/handling, submission of specimen other than nasopharyngeal swab, presence of viral mutation(s) within the areas targeted by this assay, and inadequate number of viral copies(<138 copies/mL). A negative result must be combined with clinical observations, patient history, and epidemiological information. The expected result is Negative.  Fact Sheet for Patients:  BloggerCourse.com  Fact Sheet for Healthcare Providers:  SeriousBroker.it  This test is no                          t yet approved or cleared by the [place] FDA and  has been authorized for detection and/or diagnosis of SARS-CoV-2 by FDA under an Emergency Use Authorization (EUA). This EUA will remain  in effect (meaning this test can be used) for the duration of the COVID-19 declaration under Section 564(b)(1) of the Act, 21 U.S.C.section 360bbb-3(b)(1), unless the authorization is terminated  or revoked sooner.       Influenza A by PCR 11/13/2020 NEGATIVE  NEGATIVE Final   Influenza B by PCR 11/13/2020 NEGATIVE  NEGATIVE Final   Comment: (NOTE) The Xpert Xpress SARS-CoV-2/FLU/RSV plus assay is intended as an aid in the diagnosis of influenza from Nasopharyngeal swab specimens and should not be used as a sole basis for treatment. Nasal washings and aspirates are unacceptable for Xpert Xpress SARS-CoV-2/FLU/RSV testing.  Fact Sheet for Patients: BloggerCourse.com  Fact Sheet for Healthcare Providers: SeriousBroker.it  This test is not yet approved or cleared by the [place] FDA and has been authorized for detection and/or diagnosis of SARS-CoV-2 by FDA under an Emergency Use Authorization (EUA). This EUA will remain in effect (meaning this test can be used) for the duration of the COVID-19 declaration under Section 564(b)(1) of the Act, 21 U.S.C. section  360bbb-3(b)(1), unless the authorization is terminated or revoked.  Performed at [facility], [address] [place], [place] [address]   Admission on 10/12/2020, Discharged on 10/16/2020  Component Date Value Ref Range Status   Valproic Acid Lvl 10/12/2020 29 (A) 50.0 - 100.0 ug/mL Final   Performed at [facility], [address] [place], [place] [address]  Admission on 10/11/2020, Discharged on 10/12/2020  Component Date Value Ref Range Status   SARS Coronavirus 2 by RT PCR 10/11/2020 NEGATIVE  NEGATIVE Final   Comment: (NOTE) SARS-CoV-2 target nucleic acids are NOT DETECTED.  The SARS-CoV-2 RNA is generally detectable in upper respiratory specimens during the acute phase of infection. The lowest concentration of SARS-CoV-2 viral copies this assay can detect is 138 copies/mL. A  negative result does not preclude SARS-Cov-2 infection and should not be used as the sole basis for treatment or other patient management decisions. A negative result may occur with  improper specimen collection/handling, submission of specimen other than nasopharyngeal swab, presence of viral mutation(s) within the areas targeted by this assay, and inadequate number of viral copies(<138 copies/mL). A negative result must be combined with clinical observations, patient history, and epidemiological information. The expected result is Negative.  Fact Sheet for Patients:  BloggerCourse.com  Fact Sheet for Healthcare Providers:  SeriousBroker.it  This test is no                          t yet approved or cleared by the [place] FDA and  has been authorized for detection and/or diagnosis of SARS-CoV-2 by FDA under an Emergency Use Authorization (EUA). This EUA will remain  in effect (meaning this test can be used) for the duration of the COVID-19 declaration under Section 564(b)(1) of the Act, 21 U.S.C.section  360bbb-3(b)(1), unless the authorization is terminated  or revoked sooner.       Influenza A by PCR 10/11/2020 NEGATIVE  NEGATIVE Final   Influenza B by PCR 10/11/2020 NEGATIVE  NEGATIVE Final   Comment: (NOTE) The Xpert Xpress SARS-CoV-2/FLU/RSV plus assay is intended as an aid in the diagnosis of influenza from Nasopharyngeal swab specimens and should not be used as a sole basis for treatment. Nasal washings and aspirates are unacceptable for Xpert Xpress SARS-CoV-2/FLU/RSV testing.  Fact Sheet for Patients: BloggerCourse.com  Fact Sheet for Healthcare Providers: SeriousBroker.it  This test is not yet approved or cleared by the [place] FDA and has been authorized for detection and/or diagnosis of SARS-CoV-2 by FDA under an Emergency Use Authorization (EUA). This EUA will remain in effect (meaning this test can be used) for the duration of the COVID-19 declaration under Section 564(b)(1) of the Act, 21 U.S.C. section 360bbb-3(b)(1), unless the authorization is terminated or revoked.  Performed at [facility], [address] [place], [place] [address]    Sodium 10/11/2020 132 (A) 135 - 145 mmol/L Final   Potassium 10/11/2020 3.5  3.5 - 5.1 mmol/L Final   Chloride 10/11/2020 99  98 - 111 mmol/L Final   CO2 10/11/2020 23  22 - 32 mmol/L Final   Glucose, Bld 10/11/2020 134 (A) 70 - 99 mg/dL Final   Glucose reference range applies only to samples taken after fasting for at least 8 hours.   BUN 10/11/2020 11  6 - 20 mg/dL Final   Creatinine, Ser 10/11/2020 0.78  0.61 - 1.24 mg/dL Final   Calcium 27/05/5007 9.0  8.9 - 10.3 mg/dL Final   Total Protein 38/18/2993 6.9  6.5 - 8.1 g/dL Final   Albumin 71/69/6789 4.2  3.5 - 5.0 g/dL Final   AST 38/12/1749 24  15 - 41 U/L Final   ALT 10/11/2020 21  0 - 44 U/L Final   Alkaline Phosphatase 10/11/2020 77  38 - 126 U/L Final   Total Bilirubin 10/11/2020 1.1  0.3 - 1.2 mg/dL  Final   GFR, Estimated 10/11/2020 >60  >60 mL/min Final   Comment: (NOTE) Calculated using the CKD-EPI Creatinine Equation (2021)    Anion gap 10/11/2020 10  5 - 15 Final   Performed at [facility], [address] [place], [place] [address]   Alcohol, Ethyl (B) 10/11/2020 <10  <10 mg/dL Final   Comment: (NOTE) Lowest detectable  limit for serum alcohol is 10 mg/dL.  For medical purposes only. Performed at [facility], [address], [place], [place] [address]    Opiates 10/11/2020 NONE DETECTED  NONE DETECTED Final   Cocaine 10/11/2020 NONE DETECTED  NONE DETECTED Final   Benzodiazepines 10/11/2020 NONE DETECTED  NONE DETECTED Final   Amphetamines 10/11/2020 NONE DETECTED  NONE DETECTED Final   Tetrahydrocannabinol 10/11/2020 NONE DETECTED  NONE DETECTED Final   Barbiturates 10/11/2020 NONE DETECTED  NONE DETECTED Final   Comment: (NOTE) DRUG SCREEN FOR MEDICAL PURPOSES ONLY.  IF CONFIRMATION IS NEEDED FOR ANY PURPOSE, NOTIFY LAB WITHIN 5 DAYS.  LOWEST DETECTABLE LIMITS FOR URINE DRUG SCREEN Drug Class                     Cutoff (ng/mL) Amphetamine and metabolites    1000 Barbiturate and metabolites    200 Benzodiazepine                 200 Tricyclics and metabolites     300 Opiates and metabolites        300 Cocaine and metabolites        300 THC                            50 Performed at [facility], [address], [place], [place] [address]    WBC 10/11/2020 10.3  4.0 - 10.5 K/uL Final   RBC 10/11/2020 4.48  4.22 - 5.81 MIL/uL Final   Hemoglobin 10/11/2020 13.7  13.0 - 17.0 g/dL Final   HCT 09/81/1914 39.6  39.0 - 52.0 % Final   MCV 10/11/2020 88.4  80.0 - 100.0 fL Final   MCH 10/11/2020 30.6  26.0 - 34.0 pg Final   MCHC 10/11/2020 34.6  30.0 - 36.0 g/dL Final   RDW 78/29/5621 12.6  11.5 - 15.5 % Final   Platelets 10/11/2020 362  150 - 400 K/uL Final   nRBC 10/11/2020 0.0  0.0 - 0.2 % Final   Neutrophils Relative % 10/11/2020 73  % Final    Neutro Abs 10/11/2020 7.4  1.7 - 7.7 K/uL Final   Lymphocytes Relative 10/11/2020 15  % Final   Lymphs Abs 10/11/2020 1.5  0.7 - 4.0 K/uL Final   Monocytes Relative 10/11/2020 10  % Final   Monocytes Absolute 10/11/2020 1.1 (A) 0.1 - 1.0 K/uL Final   Eosinophils Relative 10/11/2020 2  % Final   Eosinophils Absolute 10/11/2020 0.2  0.0 - 0.5 K/uL Final   Basophils Relative 10/11/2020 0  % Final   Basophils Absolute 10/11/2020 0.0  0.0 - 0.1 K/uL Final   Immature Granulocytes 10/11/2020 0  % Final   Abs Immature Granulocytes 10/11/2020 0.04  0.00 - 0.07 K/uL Final   Performed at [facility], [address] [place], [place] [address]   Salicylate Lvl 10/11/2020 <7.0 (A) 7.0 - 30.0 mg/dL Final   Performed at [facility], [address] [place], [place] [address]   Acetaminophen (Tylenol), Serum 10/11/2020 <10 (A) 10 - 30 ug/mL Final   Comment: (NOTE) Therapeutic concentrations vary significantly. A range of 10-30 ug/mL  may be an effective concentration for many patients. However, some  are best treated at concentrations outside of this range. Acetaminophen concentrations >150 ug/mL at 4 hours after ingestion  and >50 ug/mL at 12 hours after ingestion are often associated with  toxic reactions.  Performed at [facility], [address] [place], [place] [address]   Admission on 10/06/2020, Discharged on 10/07/2020  Component Date Value Ref Range Status   WBC 10/06/2020 7.1  4.0 - 10.5 K/uL Final   RBC 10/06/2020 4.45  4.22 - 5.81 MIL/uL Final   Hemoglobin 10/06/2020 13.7  13.0 - 17.0 g/dL Final   HCT 19/14/7829 40.3  39.0 - 52.0 % Final   MCV 10/06/2020 90.6  80.0 - 100.0 fL Final   MCH 10/06/2020 30.8  26.0 - 34.0 pg Final   MCHC 10/06/2020 34.0  30.0 - 36.0 g/dL Final   RDW 56/21/3086 12.7  11.5 - 15.5 % Final   Platelets 10/06/2020 286  150 - 400 K/uL Final   nRBC 10/06/2020 0.0  0.0 - 0.2 % Final   Performed at [facility], [address] [place], [place] [address]   Salicylate Lvl 10/06/2020 <7.0 (A) 7.0 - 30.0 mg/dL Final   Performed at [facility], [address], [place], [place] [address]   Acetaminophen (Tylenol), Serum 10/06/2020 <10 (A) 10 - 30 ug/mL Final   Comment: (NOTE) Therapeutic concentrations vary significantly. A range of 10-30 ug/mL  may be an effective concentration for many patients. However, some  are best treated at concentrations outside of this range. Acetaminophen concentrations >150 ug/mL at 4 hours after ingestion  and >50 ug/mL at 12 hours after ingestion are often associated with  toxic reactions.  Performed at [facility], [address], [place], [place] [address]    Sodium 10/06/2020 137  135 - 145 mmol/L Final   Potassium 10/06/2020 3.1 (A) 3.5 - 5.1 mmol/L Final   Chloride 10/06/2020 100  98 - 111 mmol/L Final   CO2 10/06/2020 25  22 - 32 mmol/L Final   Glucose, Bld 10/06/2020 104 (A) 70 - 99 mg/dL Final   Glucose reference range applies only to samples taken after fasting for at least 8 hours.   BUN 10/06/2020 12  6 - 20 mg/dL Final   Creatinine, Ser 10/06/2020 0.62  0.61 - 1.24 mg/dL Final   Calcium 24/40/1027 8.9  8.9 - 10.3 mg/dL Final   Total Protein 25/36/6440 7.5  6.5 - 8.1 g/dL Final   Albumin 34/74/2595 4.9  3.5 - 5.0 g/dL Final   AST 63/87/5643 26  15 - 41 U/L Final   ALT 10/06/2020 31  0 - 44 U/L Final   Alkaline Phosphatase 10/06/2020 60  38 - 126 U/L Final   Total Bilirubin 10/06/2020 1.3 (A) 0.3 - 1.2 mg/dL Final   GFR, Estimated 10/06/2020 >60  >60 mL/min Final   Comment: (NOTE) Calculated using the CKD-EPI Creatinine Equation (2021)    Anion gap 10/06/2020 12  5 - 15 Final   Performed at [facility], [address] [place], [place] [address]   Alcohol, Ethyl (B) 10/06/2020 <10  <10 mg/dL Final   Comment: (NOTE) Lowest detectable limit for serum alcohol is 10 mg/dL.  For medical purposes  only. Performed at [facility], [address], [place], [place] [address]    SARS Coronavirus 2 by RT PCR 10/06/2020 NEGATIVE  NEGATIVE Final   Comment: (NOTE) SARS-CoV-2 target nucleic acids are NOT DETECTED.  The SARS-CoV-2 RNA is generally detectable in upper respiratory specimens during the acute phase of infection. The lowest concentration of SARS-CoV-2 viral copies this assay can detect is 138 copies/mL. A negative result does not preclude SARS-Cov-2 infection and  should not be used as the sole basis for treatment or other patient management decisions. A negative result may occur with  improper specimen collection/handling, submission of specimen other than nasopharyngeal swab, presence of viral mutation(s) within the areas targeted by this assay, and inadequate number of viral copies(<138 copies/mL). A negative result must be combined with clinical observations, patient history, and epidemiological information. The expected result is Negative.  Fact Sheet for Patients:  BloggerCourse.com  Fact Sheet for Healthcare Providers:  SeriousBroker.it  This test is no                          t yet approved or cleared by the [place] FDA and  has been authorized for detection and/or diagnosis of SARS-CoV-2 by FDA under an Emergency Use Authorization (EUA). This EUA will remain  in effect (meaning this test can be used) for the duration of the COVID-19 declaration under Section 564(b)(1) of the Act, 21 U.S.C.section 360bbb-3(b)(1), unless the authorization is terminated  or revoked sooner.       Influenza A by PCR 10/06/2020 NEGATIVE  NEGATIVE Final   Influenza B by PCR 10/06/2020 NEGATIVE  NEGATIVE Final   Comment: (NOTE) The Xpert Xpress SARS-CoV-2/FLU/RSV plus assay is intended as an aid in the diagnosis of influenza from Nasopharyngeal swab specimens and should not be used as a sole basis for  treatment. Nasal washings and aspirates are unacceptable for Xpert Xpress SARS-CoV-2/FLU/RSV testing.  Fact Sheet for Patients: BloggerCourse.com  Fact Sheet for Healthcare Providers: SeriousBroker.it  This test is not yet approved or cleared by the [place] FDA and has been authorized for detection and/or diagnosis of SARS-CoV-2 by FDA under an Emergency Use Authorization (EUA). This EUA will remain in effect (meaning this test can be used) for the duration of the COVID-19 declaration under Section 564(b)(1) of the Act, 21 U.S.C. section 360bbb-3(b)(1), unless the authorization is terminated or revoked.  Performed at [facility], [address] [place], [place] [address]    Opiates 10/07/2020 NONE DETECTED  NONE DETECTED Final   Cocaine 10/07/2020 NONE DETECTED  NONE DETECTED Final   Benzodiazepines 10/07/2020 NONE DETECTED  NONE DETECTED Final   Amphetamines 10/07/2020 POSITIVE (A) NONE DETECTED Final   Tetrahydrocannabinol 10/07/2020 NONE DETECTED  NONE DETECTED Final   Barbiturates 10/07/2020 NONE DETECTED  NONE DETECTED Final   Comment: (NOTE) DRUG SCREEN FOR MEDICAL PURPOSES ONLY.  IF CONFIRMATION IS NEEDED FOR ANY PURPOSE, NOTIFY LAB WITHIN 5 DAYS.  LOWEST DETECTABLE LIMITS FOR URINE DRUG SCREEN Drug Class                     Cutoff (ng/mL) Amphetamine and metabolites    1000 Barbiturate and metabolites    200 Benzodiazepine                 200 Tricyclics and metabolites     300 Opiates and metabolites        300 Cocaine and metabolites        300 THC                            50 Performed at [facility], [address], [place], [place] [address]   Admission on 09/25/2020, Discharged on 10/01/2020  Component Date Value Ref Range Status   Cholesterol 09/27/2020 161  0 - 200 mg/dL Final   Triglycerides 09/81/1914 156 (A) <  150 mg/dL Final   HDL 16/12/9602 52  >40  mg/dL Final   Total CHOL/HDL Ratio 09/27/2020 3.1  RATIO Final   VLDL 09/27/2020 31  0 - 40 mg/dL Final   LDL Cholesterol 09/27/2020 78  0 - 99 mg/dL Final   Comment:        Total Cholesterol/HDL:CHD Risk Coronary Heart Disease Risk Table                     Men   Women  1/2 Average Risk   3.4   3.3  Average Risk       5.0   4.4  2 X Average Risk   9.6   7.1  3 X Average Risk  23.4   11.0        Use the calculated Patient Ratio above and the CHD Risk Table to determine the patient's CHD Risk.        ATP III CLASSIFICATION (LDL):  <100     mg/dL   Optimal  540-981  mg/dL   Near or Above                    Optimal  130-159  mg/dL   Borderline  191-478  mg/dL   High  >295     mg/dL   Very High Performed at [facility], [address], [place], [place] [address]    Hgb A1c MFr Bld 09/27/2020 5.2  4.8 - 5.6 % Final   Comment: (NOTE)         Prediabetes: 5.7 - 6.4         Diabetes: >6.4         Glycemic control for adults with diabetes: <7.0    Mean Plasma Glucose 09/27/2020 103  mg/dL Final   Comment: (NOTE) Performed At: [facility] [address] [place], [place] [id number] Jolene Schimke MD EX:[id number]    WBC 10/01/2020 8.8  4.0 - 10.5 K/uL Final   RBC 10/01/2020 4.64  4.22 - 5.81 MIL/uL Final   Hemoglobin 10/01/2020 14.2  13.0 - 17.0 g/dL Final   HCT 12/28/2534 42.5  39.0 - 52.0 % Final   MCV 10/01/2020 91.6  80.0 - 100.0 fL Final   MCH 10/01/2020 30.6  26.0 - 34.0 pg Final   MCHC 10/01/2020 33.4  30.0 - 36.0 g/dL Final   RDW 64/40/3474 12.9  11.5 - 15.5 % Final   Platelets 10/01/2020 271  150 - 400 K/uL Final   nRBC 10/01/2020 0.0  0.0 - 0.2 % Final   Neutrophils Relative % 10/01/2020 56  % Final   Neutro Abs 10/01/2020 4.9  1.7 - 7.7 K/uL Final   Lymphocytes Relative 10/01/2020 24  % Final   Lymphs Abs 10/01/2020 2.1  0.7 - 4.0 K/uL Final   Monocytes Relative 10/01/2020 9  % Final   Monocytes Absolute 10/01/2020 0.8  0.1 - 1.0 K/uL  Final   Eosinophils Relative 10/01/2020 6  % Final   Eosinophils Absolute 10/01/2020 0.5  0.0 - 0.5 K/uL Final   Basophils Relative 10/01/2020 1  % Final   Basophils Absolute 10/01/2020 0.1  0.0 - 0.1 K/uL Final   Immature Granulocytes 10/01/2020 4  % Final   Abs Immature Granulocytes 10/01/2020 0.33 (A) 0.00 - 0.07 K/uL Final   Performed at [facility], [address], [place], [place] [address]   Sodium 10/01/2020 140  135 - 145 mmol/L Final   Potassium 10/01/2020  4.0  3.5 - 5.1 mmol/L Final   Chloride 10/01/2020 105  98 - 111 mmol/L Final   CO2 10/01/2020 22  22 - 32 mmol/L Final   Glucose, Bld 10/01/2020 90  70 - 99 mg/dL Final   Glucose reference range applies only to samples taken after fasting for at least 8 hours.   BUN 10/01/2020 16  6 - 20 mg/dL Final   Creatinine, Ser 10/01/2020 0.67  0.61 - 1.24 mg/dL Final   Calcium 16/12/9602 9.2  8.9 - 10.3 mg/dL Final   Total Protein 54/11/8117 6.9  6.5 - 8.1 g/dL Final   Albumin 14/78/2956 4.4  3.5 - 5.0 g/dL Final   AST 21/30/8657 32  15 - 41 U/L Final   ALT 10/01/2020 31  0 - 44 U/L Final   Alkaline Phosphatase 10/01/2020 51  38 - 126 U/L Final   Total Bilirubin 10/01/2020 0.5  0.3 - 1.2 mg/dL Final   GFR, Estimated 10/01/2020 >60  >60 mL/min Final   Comment: (NOTE) Calculated using the CKD-EPI Creatinine Equation (2021)    Anion gap 10/01/2020 13  5 - 15 Final   Performed at [facility], [address], [place], [place] [address]   Valproic Acid Lvl 10/01/2020 66  50.0 - 100.0 ug/mL Final   Performed at [facility], [address] [place], [place] [address]  Admission on 09/21/2020, Discharged on 09/25/2020  Component Date Value Ref Range Status   Sodium 09/21/2020 139  135 - 145 mmol/L Final   Potassium 09/21/2020 4.0  3.5 - 5.1 mmol/L Final   Chloride 09/21/2020 103  98 - 111 mmol/L Final   CO2 09/21/2020 25  22 - 32 mmol/L Final   Glucose, Bld 09/21/2020 111 (A)  70 - 99 mg/dL Final   Glucose reference range applies only to samples taken after fasting for at least 8 hours.   BUN 09/21/2020 15  6 - 20 mg/dL Final   Creatinine, Ser 09/21/2020 0.95  0.61 - 1.24 mg/dL Final   Calcium 41/32/4401 9.4  8.9 - 10.3 mg/dL Final   Total Protein 02/72/5366 6.8  6.5 - 8.1 g/dL Final   Albumin 44/05/4740 4.4  3.5 - 5.0 g/dL Final   AST 59/56/3875 15  15 - 41 U/L Final   ALT 09/21/2020 13  0 - 44 U/L Final   Alkaline Phosphatase 09/21/2020 62  38 - 126 U/L Final   Total Bilirubin 09/21/2020 0.6  0.3 - 1.2 mg/dL Final   GFR, Estimated 09/21/2020 >60  >60 mL/min Final   Comment: (NOTE) Calculated using the CKD-EPI Creatinine Equation (2021)    Anion gap 09/21/2020 11  5 - 15 Final   Performed at [facility], [address], [place], [place] [address]   Alcohol, Ethyl (B) 09/21/2020 <10  <10 mg/dL Final   Comment: (NOTE) Lowest detectable limit for serum alcohol is 10 mg/dL.  For medical purposes only. Performed at [facility], [address] [place], [place] [address]    Salicylate Lvl 09/21/2020 <7.0 (A) 7.0 - 30.0 mg/dL Final   Performed at [facility], [address] [place], [place] [address]   Acetaminophen (Tylenol), Serum 09/21/2020 <10 (A) 10 - 30 ug/mL Final   Comment: (NOTE) Therapeutic concentrations vary significantly. A range of 10-30 ug/mL  may be an effective concentration for many patients. However, some  are best treated at concentrations outside of this range. Acetaminophen concentrations >150 ug/mL at 4 hours  after ingestion  and >50 ug/mL at 12 hours after ingestion are often associated with  toxic reactions.  Performed at [facility], [address] [place], [place] [address]    WBC 09/21/2020 10.5  4.0 - 10.5 K/uL Final   RBC 09/21/2020 4.58  4.22 - 5.81 MIL/uL Final   Hemoglobin 09/21/2020 13.9  13.0 - 17.0 g/dL Final   HCT 95/62/1308 40.0  39.0 - 52.0 % Final   MCV 09/21/2020 87.3  80.0 - 100.0  fL Final   MCH 09/21/2020 30.3  26.0 - 34.0 pg Final   MCHC 09/21/2020 34.8  30.0 - 36.0 g/dL Final   RDW 65/78/4696 12.3  11.5 - 15.5 % Final   Platelets 09/21/2020 292  150 - 400 K/uL Final   nRBC 09/21/2020 0.0  0.0 - 0.2 % Final   Performed at [facility], [address] [place], [place] [address]   Opiates 09/21/2020 NONE DETECTED  NONE DETECTED Final   Cocaine 09/21/2020 NONE DETECTED  NONE DETECTED Final   Benzodiazepines 09/21/2020 NONE DETECTED  NONE DETECTED Final   Amphetamines 09/21/2020 NONE DETECTED  NONE DETECTED Final   Tetrahydrocannabinol 09/21/2020 NONE DETECTED  NONE DETECTED Final   Barbiturates 09/21/2020 NONE DETECTED  NONE DETECTED Final   Comment: (NOTE) DRUG SCREEN FOR MEDICAL PURPOSES ONLY.  IF CONFIRMATION IS NEEDED FOR ANY PURPOSE, NOTIFY LAB WITHIN 5 DAYS.  LOWEST DETECTABLE LIMITS FOR URINE DRUG SCREEN Drug Class                     Cutoff (ng/mL) Amphetamine and metabolites    1000 Barbiturate and metabolites    200 Benzodiazepine                 200 Tricyclics and metabolites     300 Opiates and metabolites        300 Cocaine and metabolites        300 THC                            50 Performed at [facility], [address], [place], [place] [address]    SARS Coronavirus 2 by RT PCR 09/21/2020 NEGATIVE  NEGATIVE Final   Comment: (NOTE) SARS-CoV-2 target nucleic acids are NOT DETECTED.  The SARS-CoV-2 RNA is generally detectable in upper respiratory specimens during the acute phase of infection. The lowest concentration of SARS-CoV-2 viral copies this assay can detect is 138 copies/mL. A negative result does not preclude SARS-Cov-2 infection and should not be used as the sole basis for treatment or other patient management decisions. A negative result may occur with  improper specimen collection/handling, submission of specimen other than nasopharyngeal swab, presence of viral mutation(s) within the areas targeted by this  assay, and inadequate number of viral copies(<138 copies/mL). A negative result must be combined with clinical observations, patient history, and epidemiological information. The expected result is Negative.  Fact Sheet for Patients:  BloggerCourse.com  Fact Sheet for Healthcare Providers:  SeriousBroker.it  This test is no                          t yet approved or cleared by the [place] FDA and  has been authorized for detection and/or diagnosis of SARS-CoV-2 by FDA under an Emergency Use Authorization (EUA). This EUA will remain  in effect (meaning this test can be used)  for the duration of the COVID-19 declaration under Section 564(b)(1) of the Act, 21 U.S.C.section 360bbb-3(b)(1), unless the authorization is terminated  or revoked sooner.       Influenza A by PCR 09/21/2020 NEGATIVE  NEGATIVE Final   Influenza B by PCR 09/21/2020 NEGATIVE  NEGATIVE Final   Comment: (NOTE) The Xpert Xpress SARS-CoV-2/FLU/RSV plus assay is intended as an aid in the diagnosis of influenza from Nasopharyngeal swab specimens and should not be used as a sole basis for treatment. Nasal washings and aspirates are unacceptable for Xpert Xpress SARS-CoV-2/FLU/RSV testing.  Fact Sheet for Patients: BloggerCourse.com  Fact Sheet for Healthcare Providers: SeriousBroker.it  This test is not yet approved or cleared by the [place] FDA and has been authorized for detection and/or diagnosis of SARS-CoV-2 by FDA under an Emergency Use Authorization (EUA). This EUA will remain in effect (meaning this test can be used) for the duration of the COVID-19 declaration under Section 564(b)(1) of the Act, 21 U.S.C. section 360bbb-3(b)(1), unless the authorization is terminated or revoked.  Performed at [facility], [address], [place], [place] [address]    Color, Urine 09/21/2020 YELLOW   YELLOW Final   APPearance 09/21/2020 CLEAR  CLEAR Final   Specific Gravity, Urine 09/21/2020 1.014  1.005 - 1.030 Final   pH 09/21/2020 7.0  5.0 - 8.0 Final   Glucose, UA 09/21/2020 NEGATIVE  NEGATIVE mg/dL Final   Hgb urine dipstick 09/21/2020 NEGATIVE  NEGATIVE Final   Bilirubin Urine 09/21/2020 NEGATIVE  NEGATIVE Final   Ketones, ur 09/21/2020 NEGATIVE  NEGATIVE mg/dL Final   Protein, ur 00/86/7619 NEGATIVE  NEGATIVE mg/dL Final   Nitrite 50/93/2671 NEGATIVE  NEGATIVE Final   Leukocytes,Ua 09/21/2020 NEGATIVE  NEGATIVE Final   Performed at [facility], [address], [place], [place] [address]   Lithium Lvl 09/23/2020 0.39 (A) 0.60 - 1.20 mmol/L Final   Performed at [facility], [address], [place], [place] [address]   Sodium 09/23/2020 140  135 - 145 mmol/L Final   Potassium 09/23/2020 3.9  3.5 - 5.1 mmol/L Final   Chloride 09/23/2020 105  98 - 111 mmol/L Final   BUN 09/23/2020 14  6 - 20 mg/dL Final   Creatinine, Ser 09/23/2020 0.80  0.61 - 1.24 mg/dL Final   Glucose, Bld 82/50/5397 96  70 - 99 mg/dL Final   Glucose reference range applies only to samples taken after fasting for at least 8 hours.   Calcium, Ion 09/23/2020 1.22  1.15 - 1.40 mmol/L Final   TCO2 09/23/2020 26  22 - 32 mmol/L Final   Hemoglobin 09/23/2020 14.3  13.0 - 17.0 g/dL Final   HCT 67/34/1937 42.0  39.0 - 52.0 % Final   Sodium 09/23/2020 138  135 - 145 mmol/L Final   Potassium 09/23/2020 4.1  3.5 - 5.1 mmol/L Final   Chloride 09/23/2020 105  98 - 111 mmol/L Final   CO2 09/23/2020 25  22 - 32 mmol/L Final   Glucose, Bld 09/23/2020 99  70 - 99 mg/dL Final   Glucose reference range applies only to samples taken after fasting for at least 8 hours.   BUN 09/23/2020 13  6 - 20 mg/dL Final   Creatinine, Ser 09/23/2020 0.91  0.61 - 1.24 mg/dL Final   Calcium 90/24/0973 9.2  8.9 - 10.3 mg/dL Final   Total Protein 53/29/9242 6.2 (A) 6.5 - 8.1 g/dL Final   Albumin 68/34/1962 4.0  3.5 - 5.0 g/dL  Final   AST 09/23/2020 13 (A) 15 - 41 U/L Final   ALT 09/23/2020 11  0 - 44 U/L Final   Alkaline Phosphatase 09/23/2020 49  38 - 126 U/L Final   Total Bilirubin 09/23/2020 0.7  0.3 - 1.2 mg/dL Final   GFR, Estimated 09/23/2020 >60  >60 mL/min Final   Comment: (NOTE) Calculated using the CKD-EPI Creatinine Equation (2021)    Anion gap 09/23/2020 8  5 - 15 Final   Performed at [facility], [address], [place], [place] [address]   WBC 09/23/2020 9.6  4.0 - 10.5 K/uL Final   RBC 09/23/2020 4.84  4.22 - 5.81 MIL/uL Final   Hemoglobin 09/23/2020 14.8  13.0 - 17.0 g/dL Final   HCT 60/45/4098 43.4  39.0 - 52.0 % Final   MCV 09/23/2020 89.7  80.0 - 100.0 fL Final   MCH 09/23/2020 30.6  26.0 - 34.0 pg Final   MCHC 09/23/2020 34.1  30.0 - 36.0 g/dL Final   RDW 11/91/4782 12.4  11.5 - 15.5 % Final   Platelets 09/23/2020 291  150 - 400 K/uL Final   nRBC 09/23/2020 0.0  0.0 - 0.2 % Final   Performed at [facility], [address] [place], [place] [address]   Glucose-Capillary 09/23/2020 100 (A) 70 - 99 mg/dL Final   Glucose reference range applies only to samples taken after fasting for at least 8 hours.   Comment 1 09/23/2020 Notify RN   Final   Troponin I (High Sensitivity) 09/23/2020 3  <18 ng/L Final   Comment: (NOTE) Elevated high sensitivity troponin I (hsTnI) values and significant  changes across serial measurements may suggest ACS but many other  chronic and acute conditions are known to elevate hsTnI results.  Refer to the "Links" section for chest pain algorithms and additional  guidance. Performed at [facility], [address], [place], [place] [address]    Magnesium 09/23/2020 2.3  1.7 - 2.4 mg/dL Final   Performed at [facility], [address], [place], [place] [address]   Phosphorus 09/23/2020 3.5  2.5 - 4.6 mg/dL Final   Performed at [facility], [address], [place], [place] [address]   TSH 09/23/2020 1.957  0.350 - 4.500 uIU/mL  Final   Comment: Performed by a 3rd Generation assay with a functional sensitivity of <=0.01 uIU/mL. Performed at [facility], [address], [place], [place] [address]    T3, Free 09/23/2020 3.3  2.0 - 4.4 pg/mL Final   Comment: (NOTE) Performed At: [facility] [address] [place], [place] [id number] Jolene Schimke MD DG:[id number]    SARS Coronavirus 2 09/24/2020 NEGATIVE  NEGATIVE Final   Comment: (NOTE) SARS-CoV-2 target nucleic acids are NOT DETECTED.  The SARS-CoV-2 RNA is generally detectable in upper and lower respiratory specimens during the acute phase of infection. Negative results do not preclude SARS-CoV-2 infection, do not rule out co-infections with other pathogens, and should not be used as the sole basis for treatment or other patient management decisions. Negative results must be combined with clinical observations, patient history, and epidemiological information. The expected result is Negative.  Fact Sheet for Patients: HairSlick.no  Fact Sheet for Healthcare Providers: quierodirigir.com  This test is not yet approved or cleared by the [place] FDA and  has been authorized for detection and/or diagnosis of SARS-CoV-2 by FDA under an Emergency Use Authorization (EUA). This EUA will remain  in  effect (meaning this test can be used) for the duration of the COVID-19 declaration under Se                          ction 564(b)(1) of the Act, 21 U.S.C. section 360bbb-3(b)(1), unless the authorization is terminated or revoked sooner.  Performed at [facility], [address], [place], [place] [address]     Blood Alcohol level:  Lab Results  Component Value Date   ETH <10 11/13/2020   ETH <10 10/11/2020    Metabolic Disorder Labs: Lab Results  Component Value Date   HGBA1C 5.2 09/27/2020   MPG 103 09/27/2020   MPG 102.54 01/24/2020   No results found for:  PROLACTIN Lab Results  Component Value Date   CHOL 161 09/27/2020   TRIG 156 (H) 09/27/2020   HDL 52 09/27/2020   CHOLHDL 3.1 09/27/2020   VLDL 31 09/27/2020   LDLCALC 78 09/27/2020   LDLCALC 120 (H) 01/24/2020    Therapeutic Lab Levels: Lab Results  Component Value Date   LITHIUM 0.39 (L) 09/23/2020   LITHIUM 0.38 (L) 01/27/2020   Lab Results  Component Value Date   VALPROATE 29 (L) 10/12/2020   VALPROATE 66 10/01/2020   No components found for:  CBMZ  Physical Findings   AIMS    Flowsheet Row [facility]  AIMS Total Score 0      AUDIT    Flowsheet Row [facility] Ad[facility]  Alcohol Use Disorder Identification Test Final Score (AUDIT) 0 8      Flowsheet Row ED from 11/14/2020 in [facility] ED from 11/13/2020 in [facility] ED from 10/27/2020 in [facility]  C-SSRS RISK CATEGORY High Risk High Risk Error: Question 6 not populated        Musculoskeletal  Strength & Muscle Tone: within normal limits Gait & Station: normal Patient leans: N/A  Psychiatric Specialty Exam  Presentation  General Appearance: Appropriate for Environment; Casual  Eye Contact:Good  Speech:Clear and Coherent; Normal Rate  Speech Volume:Normal  Handedness:Right   Mood and Affect  Mood:Euthymic  Affect:Appropriate; Congruent   Thought Process  Thought Processes:Coherent; Goal Directed; Linear  Descriptions of Associations:Intact  Orientation:Full (Time, Place and Person)  Thought Content:WDL; Logical  Diagnosis of Schizophrenia or Schizoaffective disorder in past: No    Hallucinations:Hallucinations: None  Ideas of Reference:None  Suicidal  Thoughts:Suicidal Thoughts: No  Homicidal Thoughts:Homicidal Thoughts: No   Sensorium  Memory:Immediate Good; Recent Good; Remote Good  Judgment:Other (comment) (fair; improving)  Insight:Good   Executive Functions  Concentration:Good  Attention Span:Good  Recall:Good  Fund of Knowledge:Good  Language:Good   Psychomotor Activity  Psychomotor Activity:Psychomotor Activity: Normal   Assets  Assets:Communication Skills; Desire for Improvement; Physical Health   Sleep  Sleep:Sleep: Fair   No data recorded  Physical Exam  Physical Exam Constitutional:      Appearance: Normal appearance. He is normal weight.  HENT:     Head: Normocephalic and atraumatic.  Eyes:     Extraocular Movements: Extraocular movements intact.  Pulmonary:     Effort: Pulmonary effort is normal.  Neurological:     General: No focal deficit present.     Mental Status: He is alert.  Psychiatric:  Attention and Perception: Attention and perception normal.        Speech: Speech normal.        Behavior: Behavior is cooperative.   Review of Systems  Constitutional:  Negative for chills and fever.  HENT:  Negative for hearing loss.   Eyes:  Negative for discharge and redness.  Respiratory:  Negative for cough.   Cardiovascular:  Negative for chest pain.  Gastrointestinal:  Negative for abdominal pain.  Musculoskeletal:  Negative for myalgias.  Neurological:  Negative for headaches.  Psychiatric/Behavioral:  Negative for hallucinations, substance abuse and suicidal ideas.   Blood pressure 111/65, pulse (!) 58, temperature (!) 97.5 F (36.4 C), temperature source Tympanic, resp. rate 18, height 5\' 10"  (1.778 m), weight 74.8 kg, SpO2 99 %. Body mass index is 23.66 kg/m.  Treatment Plan Summary: Daily contact with patient to assess and evaluate symptoms and progress in treatment   Brett House is a 35 y.o. male with a history of Bipolar I disorder and polysubstance use admitted to the  [facility] from [facility] due to [facility] with plan to jump in front of a train. Patient is agreeable to [facility] admission. Labs ordered and reviewed - CMP, CBC grossly normal; EtOH, UDS, Resp panel negative; RPR, Hepatitis panel, HIV non-reactive; EKG wnl. Patient fits criteria for bipolar disorder with manic episodes, depressive episodes, and psychotic symptoms - these episodes do occur outside of periods of substance use per history (although manic episodes are less intense and less frequent - unclear if he meets criteria for bipolar I or II). Family history is also concerning for major mental illness. He appears to be in a mixed episode on admission. He also has a history of substance-induced manic episodes. Due to his current living situation and his prior negative experience with lithium, it is better to use antipsychotics for treatment. Patient has tolerated medication initiation well without reported SE/AE.  Patient remains appropriate for treatment at [facility].    Bipolar I disorder - continue Olanzapine 10mg  qhs - Continue Prozac 20mg  daily  Substance use - Nicoderm CQ 21mg /16hr patch - Accepted to [facility] residential rehab. Will start on 9/19 at 0900   Asthma -albuterol prn inhaler  Insomnia -increase vistaril to 50 mg qhs prn for insomnia (9/16)  Nutrition -added ensure per patient request and reported recent weight loss   Dispo - Going to [facility] residential program on 9/19 at 0900. Patient will need to get 14 day samples for all medications prior to discharge    10/19, MD 11/16/2020 11:12 AM

## 2020-11-17 DIAGNOSIS — F319 Bipolar disorder, unspecified: Secondary | ICD-10-CM | POA: Diagnosis not present

## 2020-11-17 DIAGNOSIS — F17210 Nicotine dependence, cigarettes, uncomplicated: Secondary | ICD-10-CM | POA: Diagnosis not present

## 2020-11-17 DIAGNOSIS — F419 Anxiety disorder, unspecified: Secondary | ICD-10-CM | POA: Diagnosis not present

## 2020-11-17 DIAGNOSIS — F1290 Cannabis use, unspecified, uncomplicated: Secondary | ICD-10-CM | POA: Diagnosis not present

## 2020-11-17 MED ORDER — TRAZODONE HCL 50 MG PO TABS
50.0000 mg | ORAL_TABLET | Freq: Every day | ORAL | Status: DC
  Administered 2020-11-17 – 2020-11-18 (×2): 50 mg via ORAL
  Filled 2020-11-17: qty 14
  Filled 2020-11-17 (×2): qty 1

## 2020-11-17 NOTE — ED Notes (Signed)
Pt sleeping at present, no distress noted, monitoring for safety. 

## 2020-11-17 NOTE — Progress Notes (Signed)
Patient visible on unit and interacting with peer.

## 2020-11-17 NOTE — ED Notes (Signed)
Pt eating dinner

## 2020-11-17 NOTE — Group Note (Signed)
Group Topic: Social Support  Group Date: 11/17/2020 Start Time: 1000 End Time: 1045 Facilitators: Loma Newton  Department: [facility]  Number of Participants: 3  Group Focus: identifying support systems Treatment Modality:  Cognitive Behavioral Therapy Interventions utilized were support Purpose: improve communication skills and finding ways to utilize support systems to help with a current problem.  Name: Brett House Date of Birth: 1985-07-09  MR: [id number]    Level of Participation: active Quality of Participation: initiates communication Interactions with others: gave feedback Mood/Affect: positive Triggers (if applicable): n/a Cognition: coherent/clear Progress: Gaining insight Response: positive response to feedback. Plan: patient will be encouraged to reach out to his sister and keep track of progress. Pt was given a calendar to monitor sobriety.  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

## 2020-11-17 NOTE — ED Notes (Signed)
Pt given breakfast.

## 2020-11-17 NOTE — ED Notes (Signed)
Pt given lunch

## 2020-11-17 NOTE — ED Notes (Signed)
Patient out in dayroom and attending groups.  Medication compliant.  Denied SI, HI, AVH.

## 2020-11-17 NOTE — ED Notes (Signed)
Patient denying SI, HI or AVH at this time.  No sxs of distress - will continue to monitor for safety

## 2020-11-17 NOTE — ED Provider Notes (Addendum)
Behavioral Health Progress Note  Date and Time: 11/17/2020 2:11 PM Name: Brett House MRN:  [id number]  Subjective: Patient seen and reassessed face-to-face by this provider and chart reviewed. On evaluation, patient is alert and oriented x4. His thought process is logical and speech is coherent. His mood is euthymic and affect is congruent. He reports "feeling pretty good today" and is excited about going to [facility] on Monday for substance abuse treatment. He states that his long-term plan is to go to a 2-year program for substance abuse. He denies suicidal ideations. He homicidal ideations. He denies auditory and visual hallucinations. He does not appear to be responding to internal or external stimuli. He reports poor sleep last night and states that the Vistaril is not helping. He reports that his depression has improved but reports feeling a little depressed this morning because he misses his family and has not been able to get in contact with them.  We discussed adding trazodone 50 mg in addition to Vistaril 50 at bedtime to help with sleep. Patient agrees to the stated plan.  Diagnosis:  Final diagnoses:  Bipolar I disorder (HCC)    Total Time spent with patient: 15 minutes  Past Psychiatric History:  Past Medical History:  Past Medical History:  Diagnosis Date   Anxiety    Asthma    Bipolar disorder (HCC)    Depression    No past surgical history on file. Family History: No family history on file. Family Psychiatric  History:  Social History:  Social History   Substance and Sexual Activity  Alcohol Use Yes   Comment: 6 pack once a week for last 8-9 months     Social History   Substance and Sexual Activity  Drug Use Yes   Types: Marijuana   Comment: rarely    Social History   Socioeconomic History   Marital status: Single    Spouse name: Not on file   Number of children: 0   Years of education: Not on file   Highest education level: 12th grade  Occupational  History   Occupation: Unemployed  Tobacco Use   Smoking status: Every Day    Packs/day: 1.50    Years: 15.00    Pack years: 22.50    Types: Cigarettes   Smokeless tobacco: Current  Vaping Use   Vaping Use: Never used  Substance and Sexual Activity   Alcohol use: Yes    Comment: 6 pack once a week for last 8-9 months   Drug use: Yes    Types: Marijuana    Comment: rarely   Sexual activity: Yes  Other Topics Concern   Not on file  Social History Narrative   Not on file   Social Determinants of Health   Financial Resource Strain: Not on file  Food Insecurity: Not on file  Transportation Needs: Not on file  Physical Activity: Not on file  Stress: Not on file  Social Connections: Not on file   SDOH:  SDOH Screenings   Alcohol Screen: Low Risk    Last Alcohol Screening Score (AUDIT): 0  Depression (PHQ2-9): Not on file  Financial Resource Strain: Not on file  Food Insecurity: Not on file  Housing: Not on file  Physical Activity: Not on file  Social Connections: Not on file  Stress: Not on file  Tobacco Use: High Risk   Smoking Tobacco Use: Every Day   Smokeless Tobacco Use: Current  Transportation Needs: Not on file   Additional Social History:  Sleep: Good  Appetite:  Fair  Current Medications:  Current Facility-Administered Medications  Medication Dose Route Frequency Provider Last Rate Last Admin   acetaminophen (TYLENOL) tablet 650 mg  650 mg Oral Q6H PRN [name] A, NP       albuterol (VENTOLIN HFA) 108 (90 Base) MCG/ACT inhaler 1-2 puff  1-2 puff Inhalation Q4H PRN Estella Husk, MD       alum & mag hydroxide-simeth (MAALOX/MYLANTA) 200-200-20 MG/5ML suspension 30 mL  30 mL Oral Q4H PRN [name] A, NP       feeding supplement (ENSURE ENLIVE / ENSURE PLUS) liquid 237 mL  237 mL Oral BID BM Estella Husk, MD   237 mL at 11/17/20 0936   FLUoxetine (PROZAC) capsule 20 mg  20 mg Oral Daily Estella Husk, MD   20 mg at 11/17/20  1610   hydrOXYzine (ATARAX/VISTARIL) tablet 25 mg  25 mg Oral TID PRN Estella Husk, MD       hydrOXYzine (ATARAX/VISTARIL) tablet 50 mg  50 mg Oral QHS PRN Estella Husk, MD   50 mg at 11/16/20 2112   magnesium hydroxide (MILK OF MAGNESIA) suspension 30 mL  30 mL Oral Daily PRN [name] A, NP       nicotine (NICODERM CQ - dosed in mg/24 hours) patch 21 mg  21 mg Transdermal Daily [name] A, NP   21 mg at 11/17/20 0938   OLANZapine zydis (ZYPREXA) disintegrating tablet 10 mg  10 mg Oral QHS [name] A, NP   10 mg at 11/16/20 2113   Current Outpatient Medications  Medication Sig Dispense Refill   FLUoxetine (PROZAC) 20 MG capsule Take 1 capsule (20 mg total) by mouth daily. 30 capsule 1   hydrOXYzine (ATARAX/VISTARIL) 25 MG tablet Take 1 tablet (25 mg total) by mouth 3 (three) times daily as needed for anxiety (sleep). 30 tablet 1   nicotine (NICODERM CQ - DOSED IN MG/24 HOURS) 21 mg/24hr patch Place 1 patch (21 mg total) onto the skin daily. 28 patch 1   OLANZapine zydis (ZYPREXA) 10 MG disintegrating tablet Take 1 tablet (10 mg total) by mouth at bedtime. 30 tablet 1    Labs  Lab Results:  Admission on 11/14/2020  Component Date Value Ref Range Status   RPR Ser Ql 11/14/2020 NON REACTIVE  NON REACTIVE Final   Performed at [facility], [address] [place], [place] [address]   Hepatitis B Surface Ag 11/14/2020 NON REACTIVE  NON REACTIVE Final   HCV Ab 11/14/2020 NON REACTIVE  NON REACTIVE Final   Comment: (NOTE) Nonreactive HCV antibody screen is consistent with no HCV infections,  unless recent infection is suspected or other evidence exists to indicate HCV infection.     Hep A IgM 11/14/2020 NON REACTIVE  NON REACTIVE Final   Hep B C IgM 11/14/2020 NON REACTIVE  NON REACTIVE Final   Performed at [facility], [address], [place], [place] [address]   HIV Screen 4th Generation wRfx 11/14/2020 Non Reactive  Non Reactive Final   Performed  at [facility], [address] [place], [place] [address]  Admission on 11/13/2020, Discharged on 11/14/2020  Component Date Value Ref Range Status   Sodium 11/13/2020 136  135 - 145 mmol/L Final   Potassium 11/13/2020 3.8  3.5 - 5.1 mmol/L Final   Chloride 11/13/2020 101  98 - 111 mmol/L Final   CO2 11/13/2020 24  22 -  32 mmol/L Final   Glucose, Bld 11/13/2020 102 (A) 70 - 99 mg/dL Final   Glucose reference range applies only to samples taken after fasting for at least 8 hours.   BUN 11/13/2020 5 (A) 6 - 20 mg/dL Final   Creatinine, Ser 11/13/2020 0.69  0.61 - 1.24 mg/dL Final   Calcium 64/68/0321 9.3  8.9 - 10.3 mg/dL Final   Total Protein 22/48/2500 7.3  6.5 - 8.1 g/dL Final   Albumin 37/06/8887 4.5  3.5 - 5.0 g/dL Final   AST 16/94/5038 21  15 - 41 U/L Final   ALT 11/13/2020 14  0 - 44 U/L Final   Alkaline Phosphatase 11/13/2020 97  38 - 126 U/L Final   Total Bilirubin 11/13/2020 1.3 (A) 0.3 - 1.2 mg/dL Final   GFR, Estimated 11/13/2020 >60  >60 mL/min Final   Comment: (NOTE) Calculated using the CKD-EPI Creatinine Equation (2021)    Anion gap 11/13/2020 11  5 - 15 Final   Performed at [facility], [address], [place], [place] [address]   Alcohol, Ethyl (B) 11/13/2020 <10  <10 mg/dL Final   Comment: (NOTE) Lowest detectable limit for serum alcohol is 10 mg/dL.  For medical purposes only. Performed at [facility], [address] [place], [place] [address]    WBC 11/13/2020 7.1  4.0 - 10.5 K/uL Final   RBC 11/13/2020 5.02  4.22 - 5.81 MIL/uL Final   Hemoglobin 11/13/2020 16.0  13.0 - 17.0 g/dL Final   HCT 79/15/0569 46.2  39.0 - 52.0 % Final   MCV 11/13/2020 92.0  80.0 - 100.0 fL Final   MCH 11/13/2020 31.9  26.0 - 34.0 pg Final   MCHC 11/13/2020 34.6  30.0 - 36.0 g/dL Final   RDW 79/48/0165 13.6  11.5 - 15.5 % Final   Platelets 11/13/2020 269  150 - 400 K/uL Final   nRBC 11/13/2020 0.0  0.0 - 0.2 % Final   Performed at [facility],  [address] [place], [place] [address]   Opiates 11/13/2020 NONE DETECTED  NONE DETECTED Final   Cocaine 11/13/2020 NONE DETECTED  NONE DETECTED Final   Benzodiazepines 11/13/2020 NONE DETECTED  NONE DETECTED Final   Amphetamines 11/13/2020 NONE DETECTED  NONE DETECTED Final   Tetrahydrocannabinol 11/13/2020 NONE DETECTED  NONE DETECTED Final   Barbiturates 11/13/2020 NONE DETECTED  NONE DETECTED Final   Comment: (NOTE) DRUG SCREEN FOR MEDICAL PURPOSES ONLY.  IF CONFIRMATION IS NEEDED FOR ANY PURPOSE, NOTIFY LAB WITHIN 5 DAYS.  LOWEST DETECTABLE LIMITS FOR URINE DRUG SCREEN Drug Class                     Cutoff (ng/mL) Amphetamine and metabolites    1000 Barbiturate and metabolites    200 Benzodiazepine                 200 Tricyclics and metabolites     300 Opiates and metabolites        300 Cocaine and metabolites        300 THC                            50 Performed at [facility], [address] [place], [place] [address]    Acetaminophen (Tylenol), Serum 11/13/2020 <10 (A) 10 - 30 ug/mL Final   Comment: (NOTE) Therapeutic concentrations vary significantly. A range of 10-30 ug/mL  may  be an effective concentration for many patients. However, some  are best treated at concentrations outside of this range. Acetaminophen concentrations >150 ug/mL at 4 hours after ingestion  and >50 ug/mL at 12 hours after ingestion are often associated with  toxic reactions.  Performed at [facility], [address] [place], [place] [address]    Salicylate Lvl 11/13/2020 <7.0 (A) 7.0 - 30.0 mg/dL Final   Performed at [facility], [address], [place], [place] [address]   SARS Coronavirus 2 by RT PCR 11/13/2020 NEGATIVE  NEGATIVE Final   Comment: (NOTE) SARS-CoV-2 target nucleic acids are NOT DETECTED.  The SARS-CoV-2 RNA is generally detectable in upper respiratory specimens during the acute phase of infection. The lowest concentration of SARS-CoV-2 viral copies  this assay can detect is 138 copies/mL. A negative result does not preclude SARS-Cov-2 infection and should not be used as the sole basis for treatment or other patient management decisions. A negative result may occur with  improper specimen collection/handling, submission of specimen other than nasopharyngeal swab, presence of viral mutation(s) within the areas targeted by this assay, and inadequate number of viral copies(<138 copies/mL). A negative result must be combined with clinical observations, patient history, and epidemiological information. The expected result is Negative.  Fact Sheet for Patients:  BloggerCourse.com  Fact Sheet for Healthcare Providers:  SeriousBroker.it  This test is no                          t yet approved or cleared by the [place] FDA and  has been authorized for detection and/or diagnosis of SARS-CoV-2 by FDA under an Emergency Use Authorization (EUA). This EUA will remain  in effect (meaning this test can be used) for the duration of the COVID-19 declaration under Section 564(b)(1) of the Act, 21 U.S.C.section 360bbb-3(b)(1), unless the authorization is terminated  or revoked sooner.       Influenza A by PCR 11/13/2020 NEGATIVE  NEGATIVE Final   Influenza B by PCR 11/13/2020 NEGATIVE  NEGATIVE Final   Comment: (NOTE) The Xpert Xpress SARS-CoV-2/FLU/RSV plus assay is intended as an aid in the diagnosis of influenza from Nasopharyngeal swab specimens and should not be used as a sole basis for treatment. Nasal washings and aspirates are unacceptable for Xpert Xpress SARS-CoV-2/FLU/RSV testing.  Fact Sheet for Patients: BloggerCourse.com  Fact Sheet for Healthcare Providers: SeriousBroker.it  This test is not yet approved or cleared by the [place] FDA and has been authorized for detection and/or diagnosis of SARS-CoV-2 by FDA under  an Emergency Use Authorization (EUA). This EUA will remain in effect (meaning this test can be used) for the duration of the COVID-19 declaration under Section 564(b)(1) of the Act, 21 U.S.C. section 360bbb-3(b)(1), unless the authorization is terminated or revoked.  Performed at [facility], [address], [place], [place] [address]   Admission on 10/12/2020, Discharged on 10/16/2020  Component Date Value Ref Range Status   Valproic Acid Lvl 10/12/2020 29 (A) 50.0 - 100.0 ug/mL Final   Performed at [facility], [address], [place], [place] [address]  Admission on 10/11/2020, Discharged on 10/12/2020  Component Date Value Ref Range Status   SARS Coronavirus 2 by RT PCR 10/11/2020 NEGATIVE  NEGATIVE Final   Comment: (NOTE) SARS-CoV-2 target nucleic acids are NOT DETECTED.  The SARS-CoV-2 RNA is generally detectable in upper respiratory specimens during the acute phase of infection. The lowest  concentration of SARS-CoV-2 viral copies this assay can detect is 138 copies/mL. A negative result does not preclude SARS-Cov-2 infection and should not be used as the sole basis for treatment or other patient management decisions. A negative result may occur with  improper specimen collection/handling, submission of specimen other than nasopharyngeal swab, presence of viral mutation(s) within the areas targeted by this assay, and inadequate number of viral copies(<138 copies/mL). A negative result must be combined with clinical observations, patient history, and epidemiological information. The expected result is Negative.  Fact Sheet for Patients:  BloggerCourse.com  Fact Sheet for Healthcare Providers:  SeriousBroker.it  This test is no                          t yet approved or cleared by the [place] FDA and  has been authorized for detection and/or diagnosis of SARS-CoV-2 by FDA under an Emergency  Use Authorization (EUA). This EUA will remain  in effect (meaning this test can be used) for the duration of the COVID-19 declaration under Section 564(b)(1) of the Act, 21 U.S.C.section 360bbb-3(b)(1), unless the authorization is terminated  or revoked sooner.       Influenza A by PCR 10/11/2020 NEGATIVE  NEGATIVE Final   Influenza B by PCR 10/11/2020 NEGATIVE  NEGATIVE Final   Comment: (NOTE) The Xpert Xpress SARS-CoV-2/FLU/RSV plus assay is intended as an aid in the diagnosis of influenza from Nasopharyngeal swab specimens and should not be used as a sole basis for treatment. Nasal washings and aspirates are unacceptable for Xpert Xpress SARS-CoV-2/FLU/RSV testing.  Fact Sheet for Patients: BloggerCourse.com  Fact Sheet for Healthcare Providers: SeriousBroker.it  This test is not yet approved or cleared by the [place] FDA and has been authorized for detection and/or diagnosis of SARS-CoV-2 by FDA under an Emergency Use Authorization (EUA). This EUA will remain in effect (meaning this test can be used) for the duration of the COVID-19 declaration under Section 564(b)(1) of the Act, 21 U.S.C. section 360bbb-3(b)(1), unless the authorization is terminated or revoked.  Performed at [facility], [address] [place], [place] [address]    Sodium 10/11/2020 132 (A) 135 - 145 mmol/L Final   Potassium 10/11/2020 3.5  3.5 - 5.1 mmol/L Final   Chloride 10/11/2020 99  98 - 111 mmol/L Final   CO2 10/11/2020 23  22 - 32 mmol/L Final   Glucose, Bld 10/11/2020 134 (A) 70 - 99 mg/dL Final   Glucose reference range applies only to samples taken after fasting for at least 8 hours.   BUN 10/11/2020 11  6 - 20 mg/dL Final   Creatinine, Ser 10/11/2020 0.78  0.61 - 1.24 mg/dL Final   Calcium 11/91/4782 9.0  8.9 - 10.3 mg/dL Final   Total Protein 95/62/1308 6.9  6.5 - 8.1 g/dL Final   Albumin 65/78/4696 4.2  3.5 - 5.0 g/dL Final    AST 29/52/8413 24  15 - 41 U/L Final   ALT 10/11/2020 21  0 - 44 U/L Final   Alkaline Phosphatase 10/11/2020 77  38 - 126 U/L Final   Total Bilirubin 10/11/2020 1.1  0.3 - 1.2 mg/dL Final   GFR, Estimated 10/11/2020 >60  >60 mL/min Final   Comment: (NOTE) Calculated using the CKD-EPI Creatinine Equation (2021)    Anion gap 10/11/2020 10  5 - 15 Final   Performed at [facility], [address] [place], [place] [address]   Alcohol, Ethyl (  B) 10/11/2020 <10  <10 mg/dL Final   Comment: (NOTE) Lowest detectable limit for serum alcohol is 10 mg/dL.  For medical purposes only. Performed at [facility], [address], [place], [place] [address]    Opiates 10/11/2020 NONE DETECTED  NONE DETECTED Final   Cocaine 10/11/2020 NONE DETECTED  NONE DETECTED Final   Benzodiazepines 10/11/2020 NONE DETECTED  NONE DETECTED Final   Amphetamines 10/11/2020 NONE DETECTED  NONE DETECTED Final   Tetrahydrocannabinol 10/11/2020 NONE DETECTED  NONE DETECTED Final   Barbiturates 10/11/2020 NONE DETECTED  NONE DETECTED Final   Comment: (NOTE) DRUG SCREEN FOR MEDICAL PURPOSES ONLY.  IF CONFIRMATION IS NEEDED FOR ANY PURPOSE, NOTIFY LAB WITHIN 5 DAYS.  LOWEST DETECTABLE LIMITS FOR URINE DRUG SCREEN Drug Class                     Cutoff (ng/mL) Amphetamine and metabolites    1000 Barbiturate and metabolites    200 Benzodiazepine                 200 Tricyclics and metabolites     300 Opiates and metabolites        300 Cocaine and metabolites        300 THC                            50 Performed at [facility], [address], [place], [place] [address]    WBC 10/11/2020 10.3  4.0 - 10.5 K/uL Final   RBC 10/11/2020 4.48  4.22 - 5.81 MIL/uL Final   Hemoglobin 10/11/2020 13.7  13.0 - 17.0 g/dL Final   HCT 09/81/1914 39.6  39.0 - 52.0 % Final   MCV 10/11/2020 88.4  80.0 - 100.0 fL Final   MCH 10/11/2020 30.6  26.0 - 34.0 pg Final   MCHC 10/11/2020 34.6  30.0 - 36.0 g/dL Final    RDW 78/29/5621 12.6  11.5 - 15.5 % Final   Platelets 10/11/2020 362  150 - 400 K/uL Final   nRBC 10/11/2020 0.0  0.0 - 0.2 % Final   Neutrophils Relative % 10/11/2020 73  % Final   Neutro Abs 10/11/2020 7.4  1.7 - 7.7 K/uL Final   Lymphocytes Relative 10/11/2020 15  % Final   Lymphs Abs 10/11/2020 1.5  0.7 - 4.0 K/uL Final   Monocytes Relative 10/11/2020 10  % Final   Monocytes Absolute 10/11/2020 1.1 (A) 0.1 - 1.0 K/uL Final   Eosinophils Relative 10/11/2020 2  % Final   Eosinophils Absolute 10/11/2020 0.2  0.0 - 0.5 K/uL Final   Basophils Relative 10/11/2020 0  % Final   Basophils Absolute 10/11/2020 0.0  0.0 - 0.1 K/uL Final   Immature Granulocytes 10/11/2020 0  % Final   Abs Immature Granulocytes 10/11/2020 0.04  0.00 - 0.07 K/uL Final   Performed at [facility], [address] [place], [place] [address]   Salicylate Lvl 10/11/2020 <7.0 (A) 7.0 - 30.0 mg/dL Final   Performed at [facility], [address] [place], [place] [address]   Acetaminophen (Tylenol), Serum 10/11/2020 <10 (A) 10 - 30 ug/mL Final   Comment: (NOTE) Therapeutic concentrations vary significantly. A range of 10-30 ug/mL  may be an effective concentration for many patients. However, some  are best treated at concentrations outside of this range. Acetaminophen concentrations >150 ug/mL at 4 hours after ingestion  and >50 ug/mL  at 12 hours after ingestion are often associated with  toxic reactions.  Performed at [facility], [address], [place], [place] [address]   Admission on 10/06/2020, Discharged on 10/07/2020  Component Date Value Ref Range Status   WBC 10/06/2020 7.1  4.0 - 10.5 K/uL Final   RBC 10/06/2020 4.45  4.22 - 5.81 MIL/uL Final   Hemoglobin 10/06/2020 13.7  13.0 - 17.0 g/dL Final   HCT 60/45/4098 40.3  39.0 - 52.0 % Final   MCV 10/06/2020 90.6  80.0 - 100.0 fL Final   MCH 10/06/2020 30.8  26.0 - 34.0 pg Final   MCHC 10/06/2020 34.0  30.0 - 36.0 g/dL Final   RDW  11/91/4782 12.7  11.5 - 15.5 % Final   Platelets 10/06/2020 286  150 - 400 K/uL Final   nRBC 10/06/2020 0.0  0.0 - 0.2 % Final   Performed at [facility], [address] [place], [place] [address]   Salicylate Lvl 10/06/2020 <7.0 (A) 7.0 - 30.0 mg/dL Final   Performed at [facility], [address], [place], [place] [address]   Acetaminophen (Tylenol), Serum 10/06/2020 <10 (A) 10 - 30 ug/mL Final   Comment: (NOTE) Therapeutic concentrations vary significantly. A range of 10-30 ug/mL  may be an effective concentration for many patients. However, some  are best treated at concentrations outside of this range. Acetaminophen concentrations >150 ug/mL at 4 hours after ingestion  and >50 ug/mL at 12 hours after ingestion are often associated with  toxic reactions.  Performed at [facility], [address], [place], [place] [address]    Sodium 10/06/2020 137  135 - 145 mmol/L Final   Potassium 10/06/2020 3.1 (A) 3.5 - 5.1 mmol/L Final   Chloride 10/06/2020 100  98 - 111 mmol/L Final   CO2 10/06/2020 25  22 - 32 mmol/L Final   Glucose, Bld 10/06/2020 104 (A) 70 - 99 mg/dL Final   Glucose reference range applies only to samples taken after fasting for at least 8 hours.   BUN 10/06/2020 12  6 - 20 mg/dL Final   Creatinine, Ser 10/06/2020 0.62  0.61 - 1.24 mg/dL Final   Calcium 62/95/2841 8.9  8.9 - 10.3 mg/dL Final   Total Protein 32/44/0102 7.5  6.5 - 8.1 g/dL Final   Albumin 72/53/6644 4.9  3.5 - 5.0 g/dL Final   AST 03/47/4259 26  15 - 41 U/L Final   ALT 10/06/2020 31  0 - 44 U/L Final   Alkaline Phosphatase 10/06/2020 60  38 - 126 U/L Final   Total Bilirubin 10/06/2020 1.3 (A) 0.3 - 1.2 mg/dL Final   GFR, Estimated 10/06/2020 >60  >60 mL/min Final   Comment: (NOTE) Calculated using the CKD-EPI Creatinine Equation (2021)    Anion gap 10/06/2020 12  5 - 15 Final   Performed at [facility], [address] [place], [place] [address]   Alcohol, Ethyl (B) 10/06/2020 <10  <10 mg/dL Final   Comment: (NOTE) Lowest detectable limit for serum alcohol is 10 mg/dL.  For medical purposes only. Performed at [facility], [address] [place], [place] [address]    SARS Coronavirus 2 by RT PCR 10/06/2020 NEGATIVE  NEGATIVE Final   Comment: (NOTE) SARS-CoV-2 target nucleic acids are NOT DETECTED.  The SARS-CoV-2 RNA is generally detectable in upper respiratory specimens during the acute phase of infection. The lowest concentration of SARS-CoV-2 viral copies this assay can  detect is 138 copies/mL. A negative result does not preclude SARS-Cov-2 infection and should not be used as the sole basis for treatment or other patient management decisions. A negative result may occur with  improper specimen collection/handling, submission of specimen other than nasopharyngeal swab, presence of viral mutation(s) within the areas targeted by this assay, and inadequate number of viral copies(<138 copies/mL). A negative result must be combined with clinical observations, patient history, and epidemiological information. The expected result is Negative.  Fact Sheet for Patients:  BloggerCourse.com  Fact Sheet for Healthcare Providers:  SeriousBroker.it  This test is no                          t yet approved or cleared by the [place] FDA and  has been authorized for detection and/or diagnosis of SARS-CoV-2 by FDA under an Emergency Use Authorization (EUA). This EUA will remain  in effect (meaning this test can be used) for the duration of the COVID-19 declaration under Section 564(b)(1) of the Act, 21 U.S.C.section 360bbb-3(b)(1), unless the authorization is terminated  or revoked sooner.       Influenza A by PCR 10/06/2020 NEGATIVE  NEGATIVE Final   Influenza B by PCR 10/06/2020 NEGATIVE  NEGATIVE Final   Comment: (NOTE) The  Xpert Xpress SARS-CoV-2/FLU/RSV plus assay is intended as an aid in the diagnosis of influenza from Nasopharyngeal swab specimens and should not be used as a sole basis for treatment. Nasal washings and aspirates are unacceptable for Xpert Xpress SARS-CoV-2/FLU/RSV testing.  Fact Sheet for Patients: BloggerCourse.com  Fact Sheet for Healthcare Providers: SeriousBroker.it  This test is not yet approved or cleared by the [place] FDA and has been authorized for detection and/or diagnosis of SARS-CoV-2 by FDA under an Emergency Use Authorization (EUA). This EUA will remain in effect (meaning this test can be used) for the duration of the COVID-19 declaration under Section 564(b)(1) of the Act, 21 U.S.C. section 360bbb-3(b)(1), unless the authorization is terminated or revoked.  Performed at [facility], [address] [place], [place] [address]    Opiates 10/07/2020 NONE DETECTED  NONE DETECTED Final   Cocaine 10/07/2020 NONE DETECTED  NONE DETECTED Final   Benzodiazepines 10/07/2020 NONE DETECTED  NONE DETECTED Final   Amphetamines 10/07/2020 POSITIVE (A) NONE DETECTED Final   Tetrahydrocannabinol 10/07/2020 NONE DETECTED  NONE DETECTED Final   Barbiturates 10/07/2020 NONE DETECTED  NONE DETECTED Final   Comment: (NOTE) DRUG SCREEN FOR MEDICAL PURPOSES ONLY.  IF CONFIRMATION IS NEEDED FOR ANY PURPOSE, NOTIFY LAB WITHIN 5 DAYS.  LOWEST DETECTABLE LIMITS FOR URINE DRUG SCREEN Drug Class                     Cutoff (ng/mL) Amphetamine and metabolites    1000 Barbiturate and metabolites    200 Benzodiazepine                 200 Tricyclics and metabolites     300 Opiates and metabolites        300 Cocaine and metabolites        300 THC                            50 Performed at [facility], [address], [place], [place] [address]   Admission on 09/25/2020, Discharged on 10/01/2020   Component Date Value Ref Range Status   Cholesterol  09/27/2020 161  0 - 200 mg/dL Final   Triglycerides 16/12/9602 156 (A) <150 mg/dL Final   HDL 54/11/8117 52  >40 mg/dL Final   Total CHOL/HDL Ratio 09/27/2020 3.1  RATIO Final   VLDL 09/27/2020 31  0 - 40 mg/dL Final   LDL Cholesterol 09/27/2020 78  0 - 99 mg/dL Final   Comment:        Total Cholesterol/HDL:CHD Risk Coronary Heart Disease Risk Table                     Men   Women  1/2 Average Risk   3.4   3.3  Average Risk       5.0   4.4  2 X Average Risk   9.6   7.1  3 X Average Risk  23.4   11.0        Use the calculated Patient Ratio above and the CHD Risk Table to determine the patient's CHD Risk.        ATP III CLASSIFICATION (LDL):  <100     mg/dL   Optimal  147-829  mg/dL   Near or Above                    Optimal  130-159  mg/dL   Borderline  562-130  mg/dL   High  >865     mg/dL   Very High Performed at [facility], [address], [place], [place] [address]    Hgb A1c MFr Bld 09/27/2020 5.2  4.8 - 5.6 % Final   Comment: (NOTE)         Prediabetes: 5.7 - 6.4         Diabetes: >6.4         Glycemic control for adults with diabetes: <7.0    Mean Plasma Glucose 09/27/2020 103  mg/dL Final   Comment: (NOTE) Performed At: [facility] [address] [place], [place] [id number] Jolene Schimke MD KG:[id number]    WBC 10/01/2020 8.8  4.0 - 10.5 K/uL Final   RBC 10/01/2020 4.64  4.22 - 5.81 MIL/uL Final   Hemoglobin 10/01/2020 14.2  13.0 - 17.0 g/dL Final   HCT 64/40/3474 42.5  39.0 - 52.0 % Final   MCV 10/01/2020 91.6  80.0 - 100.0 fL Final   MCH 10/01/2020 30.6  26.0 - 34.0 pg Final   MCHC 10/01/2020 33.4  30.0 - 36.0 g/dL Final   RDW 25/95/6387 12.9  11.5 - 15.5 % Final   Platelets 10/01/2020 271  150 - 400 K/uL Final   nRBC 10/01/2020 0.0  0.0 - 0.2 % Final   Neutrophils Relative % 10/01/2020 56  % Final   Neutro Abs 10/01/2020 4.9  1.7 - 7.7 K/uL Final   Lymphocytes Relative  10/01/2020 24  % Final   Lymphs Abs 10/01/2020 2.1  0.7 - 4.0 K/uL Final   Monocytes Relative 10/01/2020 9  % Final   Monocytes Absolute 10/01/2020 0.8  0.1 - 1.0 K/uL Final   Eosinophils Relative 10/01/2020 6  % Final   Eosinophils Absolute 10/01/2020 0.5  0.0 - 0.5 K/uL Final   Basophils Relative 10/01/2020 1  % Final   Basophils Absolute 10/01/2020 0.1  0.0 - 0.1 K/uL Final   Immature Granulocytes 10/01/2020 4  % Final   Abs Immature Granulocytes 10/01/2020 0.33 (A) 0.00 - 0.07 K/uL Final   Performed at [facility], [address] [place], [place] [address]  Sodium 10/01/2020 140  135 - 145 mmol/L Final   Potassium 10/01/2020 4.0  3.5 - 5.1 mmol/L Final   Chloride 10/01/2020 105  98 - 111 mmol/L Final   CO2 10/01/2020 22  22 - 32 mmol/L Final   Glucose, Bld 10/01/2020 90  70 - 99 mg/dL Final   Glucose reference range applies only to samples taken after fasting for at least 8 hours.   BUN 10/01/2020 16  6 - 20 mg/dL Final   Creatinine, Ser 10/01/2020 0.67  0.61 - 1.24 mg/dL Final   Calcium 40/98/1191 9.2  8.9 - 10.3 mg/dL Final   Total Protein 47/82/9562 6.9  6.5 - 8.1 g/dL Final   Albumin 13/10/6576 4.4  3.5 - 5.0 g/dL Final   AST 46/96/2952 32  15 - 41 U/L Final   ALT 10/01/2020 31  0 - 44 U/L Final   Alkaline Phosphatase 10/01/2020 51  38 - 126 U/L Final   Total Bilirubin 10/01/2020 0.5  0.3 - 1.2 mg/dL Final   GFR, Estimated 10/01/2020 >60  >60 mL/min Final   Comment: (NOTE) Calculated using the CKD-EPI Creatinine Equation (2021)    Anion gap 10/01/2020 13  5 - 15 Final   Performed at [facility], [address], [place], [place] [address]   Valproic Acid Lvl 10/01/2020 66  50.0 - 100.0 ug/mL Final   Performed at [facility], [address], [place], [place] [address]  Admission on 09/21/2020, Discharged on 09/25/2020  Component Date Value Ref Range Status   Sodium 09/21/2020 139  135 - 145 mmol/L Final    Potassium 09/21/2020 4.0  3.5 - 5.1 mmol/L Final   Chloride 09/21/2020 103  98 - 111 mmol/L Final   CO2 09/21/2020 25  22 - 32 mmol/L Final   Glucose, Bld 09/21/2020 111 (A) 70 - 99 mg/dL Final   Glucose reference range applies only to samples taken after fasting for at least 8 hours.   BUN 09/21/2020 15  6 - 20 mg/dL Final   Creatinine, Ser 09/21/2020 0.95  0.61 - 1.24 mg/dL Final   Calcium 27/25/3664 9.4  8.9 - 10.3 mg/dL Final   Total Protein 40/34/7425 6.8  6.5 - 8.1 g/dL Final   Albumin 95/63/8756 4.4  3.5 - 5.0 g/dL Final   AST 43/32/9518 15  15 - 41 U/L Final   ALT 09/21/2020 13  0 - 44 U/L Final   Alkaline Phosphatase 09/21/2020 62  38 - 126 U/L Final   Total Bilirubin 09/21/2020 0.6  0.3 - 1.2 mg/dL Final   GFR, Estimated 09/21/2020 >60  >60 mL/min Final   Comment: (NOTE) Calculated using the CKD-EPI Creatinine Equation (2021)    Anion gap 09/21/2020 11  5 - 15 Final   Performed at [facility], [address] [place], [place] [address]   Alcohol, Ethyl (B) 09/21/2020 <10  <10 mg/dL Final   Comment: (NOTE) Lowest detectable limit for serum alcohol is 10 mg/dL.  For medical purposes only. Performed at [facility], [address] [place], [place] [address]    Salicylate Lvl 09/21/2020 <7.0 (A) 7.0 - 30.0 mg/dL Final   Performed at [facility], [address], [place], [place] [address]   Acetaminophen (Tylenol), Serum 09/21/2020 <10 (A) 10 - 30 ug/mL Final   Comment: (NOTE) Therapeutic concentrations vary significantly. A range of 10-30 ug/mL  may be an effective concentration for many patients. However, some  are best treated  at concentrations outside of this range. Acetaminophen concentrations >150 ug/mL at 4 hours after ingestion  and >50 ug/mL at 12 hours after ingestion are often associated with  toxic reactions.  Performed at [facility], [address] [place], [place] [address]    WBC 09/21/2020 10.5  4.0 - 10.5 K/uL Final   RBC  09/21/2020 4.58  4.22 - 5.81 MIL/uL Final   Hemoglobin 09/21/2020 13.9  13.0 - 17.0 g/dL Final   HCT 19/14/7829 40.0  39.0 - 52.0 % Final   MCV 09/21/2020 87.3  80.0 - 100.0 fL Final   MCH 09/21/2020 30.3  26.0 - 34.0 pg Final   MCHC 09/21/2020 34.8  30.0 - 36.0 g/dL Final   RDW 56/21/3086 12.3  11.5 - 15.5 % Final   Platelets 09/21/2020 292  150 - 400 K/uL Final   nRBC 09/21/2020 0.0  0.0 - 0.2 % Final   Performed at [facility], [address], [place], [place] [address]   Opiates 09/21/2020 NONE DETECTED  NONE DETECTED Final   Cocaine 09/21/2020 NONE DETECTED  NONE DETECTED Final   Benzodiazepines 09/21/2020 NONE DETECTED  NONE DETECTED Final   Amphetamines 09/21/2020 NONE DETECTED  NONE DETECTED Final   Tetrahydrocannabinol 09/21/2020 NONE DETECTED  NONE DETECTED Final   Barbiturates 09/21/2020 NONE DETECTED  NONE DETECTED Final   Comment: (NOTE) DRUG SCREEN FOR MEDICAL PURPOSES ONLY.  IF CONFIRMATION IS NEEDED FOR ANY PURPOSE, NOTIFY LAB WITHIN 5 DAYS.  LOWEST DETECTABLE LIMITS FOR URINE DRUG SCREEN Drug Class                     Cutoff (ng/mL) Amphetamine and metabolites    1000 Barbiturate and metabolites    200 Benzodiazepine                 200 Tricyclics and metabolites     300 Opiates and metabolites        300 Cocaine and metabolites        300 THC                            50 Performed at [facility], [address], [place], [place] [address]    SARS Coronavirus 2 by RT PCR 09/21/2020 NEGATIVE  NEGATIVE Final   Comment: (NOTE) SARS-CoV-2 target nucleic acids are NOT DETECTED.  The SARS-CoV-2 RNA is generally detectable in upper respiratory specimens during the acute phase of infection. The lowest concentration of SARS-CoV-2 viral copies this assay can detect is 138 copies/mL. A negative result does not preclude SARS-Cov-2 infection and should not be used as the sole basis for treatment or other patient management decisions. A negative result  may occur with  improper specimen collection/handling, submission of specimen other than nasopharyngeal swab, presence of viral mutation(s) within the areas targeted by this assay, and inadequate number of viral copies(<138 copies/mL). A negative result must be combined with clinical observations, patient history, and epidemiological information. The expected result is Negative.  Fact Sheet for Patients:  BloggerCourse.com  Fact Sheet for Healthcare Providers:  SeriousBroker.it  This test is no                          t yet approved or cleared by the [place] FDA and  has been authorized for detection and/or diagnosis of SARS-CoV-2 by FDA under an Emergency Use Authorization (EUA).  This EUA will remain  in effect (meaning this test can be used) for the duration of the COVID-19 declaration under Section 564(b)(1) of the Act, 21 U.S.C.section 360bbb-3(b)(1), unless the authorization is terminated  or revoked sooner.       Influenza A by PCR 09/21/2020 NEGATIVE  NEGATIVE Final   Influenza B by PCR 09/21/2020 NEGATIVE  NEGATIVE Final   Comment: (NOTE) The Xpert Xpress SARS-CoV-2/FLU/RSV plus assay is intended as an aid in the diagnosis of influenza from Nasopharyngeal swab specimens and should not be used as a sole basis for treatment. Nasal washings and aspirates are unacceptable for Xpert Xpress SARS-CoV-2/FLU/RSV testing.  Fact Sheet for Patients: BloggerCourse.com  Fact Sheet for Healthcare Providers: SeriousBroker.it  This test is not yet approved or cleared by the [place] FDA and has been authorized for detection and/or diagnosis of SARS-CoV-2 by FDA under an Emergency Use Authorization (EUA). This EUA will remain in effect (meaning this test can be used) for the duration of the COVID-19 declaration under Section 564(b)(1) of the Act, 21 U.S.C. section  360bbb-3(b)(1), unless the authorization is terminated or revoked.  Performed at [facility], [address] [place], [place] [address]    Color, Urine 09/21/2020 YELLOW  YELLOW Final   APPearance 09/21/2020 CLEAR  CLEAR Final   Specific Gravity, Urine 09/21/2020 1.014  1.005 - 1.030 Final   pH 09/21/2020 7.0  5.0 - 8.0 Final   Glucose, UA 09/21/2020 NEGATIVE  NEGATIVE mg/dL Final   Hgb urine dipstick 09/21/2020 NEGATIVE  NEGATIVE Final   Bilirubin Urine 09/21/2020 NEGATIVE  NEGATIVE Final   Ketones, ur 09/21/2020 NEGATIVE  NEGATIVE mg/dL Final   Protein, ur 60/45/4098 NEGATIVE  NEGATIVE mg/dL Final   Nitrite 11/91/4782 NEGATIVE  NEGATIVE Final   Leukocytes,Ua 09/21/2020 NEGATIVE  NEGATIVE Final   Performed at [facility], [address], [place], [place] [address]   Lithium Lvl 09/23/2020 0.39 (A) 0.60 - 1.20 mmol/L Final   Performed at [facility], [address], [place], [place] [address]   Sodium 09/23/2020 140  135 - 145 mmol/L Final   Potassium 09/23/2020 3.9  3.5 - 5.1 mmol/L Final   Chloride 09/23/2020 105  98 - 111 mmol/L Final   BUN 09/23/2020 14  6 - 20 mg/dL Final   Creatinine, Ser 09/23/2020 0.80  0.61 - 1.24 mg/dL Final   Glucose, Bld 78/46/9629 96  70 - 99 mg/dL Final   Glucose reference range applies only to samples taken after fasting for at least 8 hours.   Calcium, Ion 09/23/2020 1.22  1.15 - 1.40 mmol/L Final   TCO2 09/23/2020 26  22 - 32 mmol/L Final   Hemoglobin 09/23/2020 14.3  13.0 - 17.0 g/dL Final   HCT 52/84/1324 42.0  39.0 - 52.0 % Final   Sodium 09/23/2020 138  135 - 145 mmol/L Final   Potassium 09/23/2020 4.1  3.5 - 5.1 mmol/L Final   Chloride 09/23/2020 105  98 - 111 mmol/L Final   CO2 09/23/2020 25  22 - 32 mmol/L Final   Glucose, Bld 09/23/2020 99  70 - 99 mg/dL Final   Glucose reference range applies only to samples taken after fasting for at least 8 hours.   BUN 09/23/2020 13  6 - 20 mg/dL Final   Creatinine, Ser  09/23/2020 0.91  0.61 - 1.24 mg/dL Final   Calcium 40/12/2723 9.2  8.9 - 10.3 mg/dL Final   Total Protein 36/64/4034 6.2 (A) 6.5 -  8.1 g/dL Final   Albumin 34/19/3790 4.0  3.5 - 5.0 g/dL Final   AST 24/11/7351 13 (A) 15 - 41 U/L Final   ALT 09/23/2020 11  0 - 44 U/L Final   Alkaline Phosphatase 09/23/2020 49  38 - 126 U/L Final   Total Bilirubin 09/23/2020 0.7  0.3 - 1.2 mg/dL Final   GFR, Estimated 09/23/2020 >60  >60 mL/min Final   Comment: (NOTE) Calculated using the CKD-EPI Creatinine Equation (2021)    Anion gap 09/23/2020 8  5 - 15 Final   Performed at [facility], [address], [place], [place] [address]   WBC 09/23/2020 9.6  4.0 - 10.5 K/uL Final   RBC 09/23/2020 4.84  4.22 - 5.81 MIL/uL Final   Hemoglobin 09/23/2020 14.8  13.0 - 17.0 g/dL Final   HCT 26/83/4196 43.4  39.0 - 52.0 % Final   MCV 09/23/2020 89.7  80.0 - 100.0 fL Final   MCH 09/23/2020 30.6  26.0 - 34.0 pg Final   MCHC 09/23/2020 34.1  30.0 - 36.0 g/dL Final   RDW 22/29/7989 12.4  11.5 - 15.5 % Final   Platelets 09/23/2020 291  150 - 400 K/uL Final   nRBC 09/23/2020 0.0  0.0 - 0.2 % Final   Performed at [facility], [address], [place], [place] [address]   Glucose-Capillary 09/23/2020 100 (A) 70 - 99 mg/dL Final   Glucose reference range applies only to samples taken after fasting for at least 8 hours.   Comment 1 09/23/2020 Notify RN   Final   Troponin I (High Sensitivity) 09/23/2020 3  <18 ng/L Final   Comment: (NOTE) Elevated high sensitivity troponin I (hsTnI) values and significant  changes across serial measurements may suggest ACS but many other  chronic and acute conditions are known to elevate hsTnI results.  Refer to the "Links" section for chest pain algorithms and additional  guidance. Performed at [facility], [address], [place], [place] [address]    Magnesium 09/23/2020 2.3  1.7 - 2.4 mg/dL Final   Performed at [facility], [address],  [place], [place] [address]   Phosphorus 09/23/2020 3.5  2.5 - 4.6 mg/dL Final   Performed at [facility], [address] [place], [place] [address]   TSH 09/23/2020 1.957  0.350 - 4.500 uIU/mL Final   Comment: Performed by a 3rd Generation assay with a functional sensitivity of <=0.01 uIU/mL. Performed at [facility], [address] [place], [place] [address]    T3, Free 09/23/2020 3.3  2.0 - 4.4 pg/mL Final   Comment: (NOTE) Performed At: [facility] [address] [place], [place] [id number] Jolene Schimke MD YD:[id number]    SARS Coronavirus 2 09/24/2020 NEGATIVE  NEGATIVE Final   Comment: (NOTE) SARS-CoV-2 target nucleic acids are NOT DETECTED.  The SARS-CoV-2 RNA is generally detectable in upper and lower respiratory specimens during the acute phase of infection. Negative results do not preclude SARS-CoV-2 infection, do not rule out co-infections with other pathogens, and should not be used as the sole basis for treatment or other patient management decisions. Negative results must be combined with clinical observations, patient history, and epidemiological information. The expected result is Negative.  Fact Sheet for Patients: HairSlick.no  Fact Sheet for Healthcare Providers: quierodirigir.com  This test is not yet approved or cleared by the [place] FDA and  has been authorized for detection and/or diagnosis of SARS-CoV-2 by  FDA under an Emergency Use Authorization (EUA). This EUA will remain  in effect (meaning this test can be used) for the duration of the COVID-19 declaration under Se                          ction 564(b)(1) of the Act, 21 U.S.C. section 360bbb-3(b)(1), unless the authorization is terminated or revoked sooner.  Performed at [facility], [address], [place], [place] [address]     Blood Alcohol level:  Lab Results  Component Value Date   ETH <10 11/13/2020    ETH <10 10/11/2020    Metabolic Disorder Labs: Lab Results  Component Value Date   HGBA1C 5.2 09/27/2020   MPG 103 09/27/2020   MPG 102.54 01/24/2020   No results found for: PROLACTIN Lab Results  Component Value Date   CHOL 161 09/27/2020   TRIG 156 (H) 09/27/2020   HDL 52 09/27/2020   CHOLHDL 3.1 09/27/2020   VLDL 31 09/27/2020   LDLCALC 78 09/27/2020   LDLCALC 120 (H) 01/24/2020    Therapeutic Lab Levels: Lab Results  Component Value Date   LITHIUM 0.39 (L) 09/23/2020   LITHIUM 0.38 (L) 01/27/2020   Lab Results  Component Value Date   VALPROATE 29 (L) 10/12/2020   VALPROATE 66 10/01/2020   No components found for:  CBMZ  Physical Findings   AIMS    Flowsheet Row [facility]  AIMS Total Score 0      AUDIT    Flowsheet Row [facility] Ad[facility] ([facility]  Alcohol Use Disorder Identification Test Final Score (AUDIT) 0 8      Flowsheet Row ED from 11/14/2020 in [facility] ED from 11/13/2020 in [facility]  C-SSRS RISK CATEGORY High Risk High Risk Error: Question 6 not populated        Musculoskeletal  Strength & Muscle Tone: within normal limits Gait & Station: normal Patient leans: N/A  Psychiatric Specialty Exam  Presentation  General Appearance: Appropriate for Environment  Eye Contact:Fair  Speech:Clear and Coherent  Speech Volume:Normal  Handedness:Right   Mood and Affect  Mood:Euthymic  Affect:Appropriate   Thought Process  Thought Processes:Coherent; Goal Directed  Descriptions of Associations:Intact  Orientation:Full (Time, Place and Person)  Thought Content:Logical   Diagnosis of Schizophrenia or Schizoaffective disorder in past: No    Hallucinations:Hallucinations: None  Ideas of Reference:None  Suicidal Thoughts:Suicidal Thoughts: No  Homicidal Thoughts:Homicidal Thoughts: No   Sensorium  Memory:Immediate Fair; Remote Fair; Recent Fair  Judgment:Fair  Insight:Fair   Executive Functions  Concentration:Fair  Attention Span:Fair  Recall:Fair  Fund of Knowledge:Fair  Language:Fair   Psychomotor Activity  Psychomotor Activity:Psychomotor Activity: Normal   Assets  Assets:Communication Skills; Desire for Improvement; Physical Health   Sleep  Sleep:Sleep: Poor   No data recorded  Physical Exam  Physical Exam HENT:     Nose: Nose normal.  Eyes:     Conjunctiva/sclera: Conjunctivae normal.  Cardiovascular:     Rate and Rhythm: Normal rate.  Pulmonary:     Effort: Pulmonary effort is normal.  Musculoskeletal:        General: Normal range of motion.     Cervical back: Normal range of motion.  Neurological:  Mental Status: He is alert and oriented to person, place, and time.   Review of Systems  Constitutional: Negative.   HENT: Negative.    Eyes: Negative.   Respiratory: Negative.    Cardiovascular: Negative.   Gastrointestinal: Negative.   Genitourinary: Negative.   Musculoskeletal: Negative.   Skin: Negative.   Neurological: Negative.   Endo/Heme/Allergies: Negative.   Psychiatric/Behavioral:  The patient has insomnia.   Blood pressure 104/60, pulse (!) 58, temperature 97.7 F (36.5 C), temperature source Tympanic, resp. rate 18, height  (1.778 m), weight 164 lb 14.5 oz (74.8 kg), SpO2 99 %. Body mass index is 23.66 kg/m.  Treatment Plan Summary: Bipolar I disorder - continue Olanzapine  qhs - Continue Prozac  daily   Insomnia -vistaril to 50 mg qhs prn for insomnia -add Trazodone 50 mg po QHS PRN  Dispo - Going to [facility] residential program on 9/19 at 0900. Patient will need to  get 14 day samples for all medications prior to discharge  Layla Barter, NP 11/17/2020 2:11 PM

## 2020-11-17 NOTE — Group Note (Signed)
Group Topic: Social Support  Group Date: 11/17/2020 Start Time: 1930 End Time: 2000 Facilitators: Armandina Stammer, [place]  Department: [facility]  Number of Participants: 2  Group Focus: check in Treatment Modality:  Cognitive Behavioral Therapy Interventions utilized were exploration and support Purpose: explore maladaptive thinking and increase insight  Name: Brett House Date of Birth: Jul 08, 1985  MR: [id number]    Level of Participation: active Quality of Participation: engaged Interactions with others: gave feedback Mood/Affect: appropriate Triggers (if applicable): N/A Cognition: coherent/clear and insightful Progress: Moderate Response: N/A Plan: follow-up needed  Patients Problems:  Patient Active Problem List   Diagnosis Date Noted   Bipolar 1 disorder (HCC) 11/14/2020   Bipolar 1 disorder, depressed, severe (HCC) 10/12/2020   Suicidal thoughts    Substance induced mood disorder (HCC) 10/07/2020   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

## 2020-11-17 NOTE — ED Notes (Signed)
Snacks given 

## 2020-11-18 DIAGNOSIS — F17210 Nicotine dependence, cigarettes, uncomplicated: Secondary | ICD-10-CM | POA: Diagnosis not present

## 2020-11-18 DIAGNOSIS — F319 Bipolar disorder, unspecified: Secondary | ICD-10-CM | POA: Diagnosis not present

## 2020-11-18 DIAGNOSIS — F419 Anxiety disorder, unspecified: Secondary | ICD-10-CM | POA: Diagnosis not present

## 2020-11-18 DIAGNOSIS — F1290 Cannabis use, unspecified, uncomplicated: Secondary | ICD-10-CM | POA: Diagnosis not present

## 2020-11-18 NOTE — ED Notes (Signed)
PT is currently sitting in the dining room watching television.

## 2020-11-18 NOTE — ED Notes (Signed)
Pt sleeping in no acute distress. RR even and unlabored. Safety maintained. 

## 2020-11-18 NOTE — Group Note (Signed)
Group Topic: Social Support  Group Date: 11/18/2020 Start Time: 1930 End Time: 2000 Facilitators: Armandina Stammer, [place]  Department: [facility]  Number of Participants: 4  Group Focus: check in Treatment Modality:  Cognitive Behavioral Therapy Interventions utilized were exploration and support Purpose: increase insight  Name: Brett House Date of Birth: [birth year]  MR: [id number]    Level of Participation: active Quality of Participation: attentive Interactions with others: gave feedback Mood/Affect: appropriate Triggers (if applicable): n/a Cognition: goal directed Progress: Significant Response: n/a  Plan: follow-up needed  Patients Problems:  Patient Active Problem List   Diagnosis Date Noted   Bipolar 1 disorder (HCC) 11/14/2020   Bipolar 1 disorder, depressed, severe (HCC) 10/12/2020   Suicidal thoughts    Substance induced mood disorder (HCC) 10/07/2020   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

## 2020-11-18 NOTE — ED Notes (Signed)
Pt is in room sleeping, respirations are even/unlabored, environment check complete/secure, will continue to monitor patient for safety 

## 2020-11-18 NOTE — ED Notes (Signed)
Patient resting - no sxs of distress noted, will continue to monitor for safety 

## 2020-11-18 NOTE — ED Notes (Signed)
Pt watching tv and interacting with peers. No acute distress noted. Will continue to monitor for safety.

## 2020-11-18 NOTE — ED Notes (Signed)
D:  Patient A&Ox4. Denies intent to harm self/others when asked. Denies A/VH. Denies withdrawal sx from ETOH. Patient denies any physical complaints when asked. No acute distress noted.      A: Support and encouragement provided. Routine safety checks conducted according to facility protocol. Encouraged patient to notify staff if thoughts of harm toward self or others arise. Patient verbalize understanding and agreement.   R: Patient remains safe and patient verbally contracts for safety. Will continue to monitor for safety.

## 2020-11-18 NOTE — ED Notes (Signed)
Pt eating lunch

## 2020-11-18 NOTE — Group Note (Signed)
Group Topic: Fears and Unhealthy Coping Skills  Group Date: 11/18/2020 Start Time: 1100 End Time: 1150 Facilitators: Juluis Pitch P  Department: [facility]  Number of Participants: 5  Group Focus: safety plan Treatment Modality:  Solution-Focused Therapy Interventions utilized were patient education Purpose: enhance coping skills  Name: Brett House Date of Birth: 01-Jan-1986  MR: [id number]    Level of Participation: minimal Quality of Participation: quiet Interactions with others: gave feedback Mood/Affect: bored Triggers (if applicable): alcohol drugs isolation Cognition: coherent/clear Progress: Minimal Response: pt finished assessment. 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

## 2020-11-18 NOTE — ED Provider Notes (Signed)
Behavioral Health Progress Note  Date and Time: 11/18/2020 8:34 AM Name: Brett House MRN:  [id number]  Subjective: Patient seen and reassessed face-to-face and chart reviewed by this provider. On evaluation, patient is alert and oriented x4. His thought process is logical and speech is coherent. His mood is euthymic and affect is congruent. He reports that he is feeling better today and denies any depressive symptoms this morning. He reports sleeping better last night after taking the trazodone. He rates his mood 6/10 with 10 being the worst. He denies suicidal ideations. He denies homicidal ideations. He denies auditory and visual hallucinations. He does not appear to be responding to internal or external stimuli. He is compliant with taking scheduled medications without any side effects. He is compliant with attending scheduled groups. He requests the results from his chlamydia and gonorrhea test. The stated labs are processing. Patient made aware.  Diagnosis:  Final diagnoses:  Bipolar I disorder (HCC)    Total Time spent with patient: 15 minutes  Past Psychiatric History: Hx of anxiety, depression and bipolar  Past Medical History:  Past Medical History:  Diagnosis Date   Anxiety    Asthma    Bipolar disorder (HCC)    Depression    No past surgical history on file. Family History: No family history on file. Family Psychiatric  History:  Social History:  Social History   Substance and Sexual Activity  Alcohol Use Yes   Comment: 6 pack once a week for last 8-9 months     Social History   Substance and Sexual Activity  Drug Use Yes   Types: Marijuana   Comment: rarely    Social History   Socioeconomic History   Marital status: Single    Spouse name: Not on file   Number of children: 0   Years of education: Not on file   Highest education level: 12th grade  Occupational History   Occupation: Unemployed  Tobacco Use   Smoking status: Every Day    Packs/day: 1.50     Years: 15.00    Pack years: 22.50    Types: Cigarettes   Smokeless tobacco: Current  Vaping Use   Vaping Use: Never used  Substance and Sexual Activity   Alcohol use: Yes    Comment: 6 pack once a week for last 8-9 months   Drug use: Yes    Types: Marijuana    Comment: rarely   Sexual activity: Yes  Other Topics Concern   Not on file  Social History Narrative   Not on file   Social Determinants of Health   Financial Resource Strain: Not on file  Food Insecurity: Not on file  Transportation Needs: Not on file  Physical Activity: Not on file  Stress: Not on file  Social Connections: Not on file   SDOH:  SDOH Screenings   Alcohol Screen: Low Risk    Last Alcohol Screening Score (AUDIT): 0  Depression (PHQ2-9): Not on file  Financial Resource Strain: Not on file  Food Insecurity: Not on file  Housing: Not on file  Physical Activity: Not on file  Social Connections: Not on file  Stress: Not on file  Tobacco Use: High Risk   Smoking Tobacco Use: Every Day   Smokeless Tobacco Use: Current  Transportation Needs: Not on file   Additional Social History:   Sleep: Good  Appetite:  Fair  Current Medications:  Current Facility-Administered Medications  Medication Dose Route Frequency Provider Last Rate Last Admin   acetaminophen (  TYLENOL) tablet 650 mg  650 mg Oral Q6H PRN [name] A, NP       albuterol (VENTOLIN HFA) 108 (90 Base) MCG/ACT inhaler 1-2 puff  1-2 puff Inhalation Q4H PRN Estella Husk, MD       alum & mag hydroxide-simeth (MAALOX/MYLANTA) 200-200-20 MG/5ML suspension 30 mL  30 mL Oral Q4H PRN [name] A, NP       feeding supplement (ENSURE ENLIVE / ENSURE PLUS) liquid 237 mL  237 mL Oral BID BM Estella Husk, MD   237 mL at 11/17/20 1424   FLUoxetine (PROZAC) capsule 20 mg  20 mg Oral Daily Estella Husk, MD   20 mg at 11/17/20 2229   hydrOXYzine (ATARAX/VISTARIL) tablet 25 mg  25 mg Oral TID PRN Estella Husk, MD        hydrOXYzine (ATARAX/VISTARIL) tablet 50 mg  50 mg Oral QHS PRN Estella Husk, MD   50 mg at 11/17/20 2110   magnesium hydroxide (MILK OF MAGNESIA) suspension 30 mL  30 mL Oral Daily PRN [name] A, NP       nicotine (NICODERM CQ - dosed in mg/24 hours) patch 21 mg  21 mg Transdermal Daily [name] A, NP   21 mg at 11/17/20 0938   OLANZapine zydis (ZYPREXA) disintegrating tablet 10 mg  10 mg Oral QHS [name] A, NP   10 mg at 11/17/20 2111   traZODone (DESYREL) tablet 50 mg  50 mg Oral QHS Bobbitt, Shalon E, NP   50 mg at 11/17/20 2203   Current Outpatient Medications  Medication Sig Dispense Refill   FLUoxetine (PROZAC) 20 MG capsule Take 1 capsule (20 mg total) by mouth daily. 30 capsule 1   hydrOXYzine (ATARAX/VISTARIL) 25 MG tablet Take 1 tablet (25 mg total) by mouth 3 (three) times daily as needed for anxiety (sleep). 30 tablet 1   nicotine (NICODERM CQ - DOSED IN MG/24 HOURS) 21 mg/24hr patch Place 1 patch (21 mg total) onto the skin daily. 28 patch 1   OLANZapine zydis (ZYPREXA) 10 MG disintegrating tablet Take 1 tablet (10 mg total) by mouth at bedtime. 30 tablet 1    Labs  Lab Results:  Admission on 11/14/2020  Component Date Value Ref Range Status   RPR Ser Ql 11/14/2020 NON REACTIVE  NON REACTIVE Final   Performed at [facility], [address] [place], [place] [address]   Hepatitis B Surface Ag 11/14/2020 NON REACTIVE  NON REACTIVE Final   HCV Ab 11/14/2020 NON REACTIVE  NON REACTIVE Final   Comment: (NOTE) Nonreactive HCV antibody screen is consistent with no HCV infections,  unless recent infection is suspected or other evidence exists to indicate HCV infection.     Hep A IgM 11/14/2020 NON REACTIVE  NON REACTIVE Final   Hep B C IgM 11/14/2020 NON REACTIVE  NON REACTIVE Final   Performed at [facility], [address] [place], [place] [address]   HIV Screen 4th Generation wRfx 11/14/2020 Non Reactive  Non Reactive Final   Performed at  [facility], [address] [place], [place] [address]  Admission on 11/13/2020, Discharged on 11/14/2020  Component Date Value Ref Range Status   Sodium 11/13/2020 136  135 - 145 mmol/L Final   Potassium 11/13/2020 3.8  3.5 - 5.1 mmol/L Final   Chloride 11/13/2020 101  98 - 111 mmol/L Final   CO2 11/13/2020 24  22 - 32 mmol/L Final  Glucose, Bld 11/13/2020 102 (A) 70 - 99 mg/dL Final   Glucose reference range applies only to samples taken after fasting for at least 8 hours.   BUN 11/13/2020 5 (A) 6 - 20 mg/dL Final   Creatinine, Ser 11/13/2020 0.69  0.61 - 1.24 mg/dL Final   Calcium 16/12/9602 9.3  8.9 - 10.3 mg/dL Final   Total Protein 54/11/8117 7.3  6.5 - 8.1 g/dL Final   Albumin 14/78/2956 4.5  3.5 - 5.0 g/dL Final   AST 21/30/8657 21  15 - 41 U/L Final   ALT 11/13/2020 14  0 - 44 U/L Final   Alkaline Phosphatase 11/13/2020 97  38 - 126 U/L Final   Total Bilirubin 11/13/2020 1.3 (A) 0.3 - 1.2 mg/dL Final   GFR, Estimated 11/13/2020 >60  >60 mL/min Final   Comment: (NOTE) Calculated using the CKD-EPI Creatinine Equation (2021)    Anion gap 11/13/2020 11  5 - 15 Final   Performed at [facility], [address], [place], [place] [address]   Alcohol, Ethyl (B) 11/13/2020 <10  <10 mg/dL Final   Comment: (NOTE) Lowest detectable limit for serum alcohol is 10 mg/dL.  For medical purposes only. Performed at [facility], [address] [place], [place] [address]    WBC 11/13/2020 7.1  4.0 - 10.5 K/uL Final   RBC 11/13/2020 5.02  4.22 - 5.81 MIL/uL Final   Hemoglobin 11/13/2020 16.0  13.0 - 17.0 g/dL Final   HCT 41/32/4401 46.2  39.0 - 52.0 % Final   MCV 11/13/2020 92.0  80.0 - 100.0 fL Final   MCH 11/13/2020 31.9  26.0 - 34.0 pg Final   MCHC 11/13/2020 34.6  30.0 - 36.0 g/dL Final   RDW 02/72/5366 13.6  11.5 - 15.5 % Final   Platelets 11/13/2020 269  150 - 400 K/uL Final   nRBC 11/13/2020 0.0  0.0 - 0.2 % Final   Performed at [facility], [address] [place], [place] [address]   Opiates 11/13/2020 NONE DETECTED  NONE DETECTED Final   Cocaine 11/13/2020 NONE DETECTED  NONE DETECTED Final   Benzodiazepines 11/13/2020 NONE DETECTED  NONE DETECTED Final   Amphetamines 11/13/2020 NONE DETECTED  NONE DETECTED Final   Tetrahydrocannabinol 11/13/2020 NONE DETECTED  NONE DETECTED Final   Barbiturates 11/13/2020 NONE DETECTED  NONE DETECTED Final   Comment: (NOTE) DRUG SCREEN FOR MEDICAL PURPOSES ONLY.  IF CONFIRMATION IS NEEDED FOR ANY PURPOSE, NOTIFY LAB WITHIN 5 DAYS.  LOWEST DETECTABLE LIMITS FOR URINE DRUG SCREEN Drug Class                     Cutoff (ng/mL) Amphetamine and metabolites    1000 Barbiturate and metabolites    200 Benzodiazepine                 200 Tricyclics and metabolites     300 Opiates and metabolites        300 Cocaine and metabolites        300 THC                            50 Performed at [facility], [address], [place], [place] [address]    Acetaminophen (Tylenol), Serum 11/13/2020 <10 (A) 10 - 30 ug/mL Final   Comment: (NOTE) Therapeutic concentrations vary significantly. A range of 10-30 ug/mL  may be an effective concentration for  many patients. However, some  are best treated at concentrations outside of this range. Acetaminophen concentrations >150 ug/mL at 4 hours after ingestion  and >50 ug/mL at 12 hours after ingestion are often associated with  toxic reactions.  Performed at [facility], [address] [place], [place] [address]    Salicylate Lvl 11/13/2020 <7.0 (A) 7.0 - 30.0 mg/dL Final   Performed at [facility], [address] [place], [place] [address]   SARS Coronavirus 2 by RT PCR 11/13/2020 NEGATIVE  NEGATIVE Final   Comment: (NOTE) SARS-CoV-2 target nucleic acids are NOT DETECTED.  The SARS-CoV-2 RNA is generally detectable in upper respiratory specimens during the acute phase of infection. The lowest concentration of SARS-CoV-2 viral copies this  assay can detect is 138 copies/mL. A negative result does not preclude SARS-Cov-2 infection and should not be used as the sole basis for treatment or other patient management decisions. A negative result may occur with  improper specimen collection/handling, submission of specimen other than nasopharyngeal swab, presence of viral mutation(s) within the areas targeted by this assay, and inadequate number of viral copies(<138 copies/mL). A negative result must be combined with clinical observations, patient history, and epidemiological information. The expected result is Negative.  Fact Sheet for Patients:  BloggerCourse.com  Fact Sheet for Healthcare Providers:  SeriousBroker.it  This test is no                          t yet approved or cleared by the [place] FDA and  has been authorized for detection and/or diagnosis of SARS-CoV-2 by FDA under an Emergency Use Authorization (EUA). This EUA will remain  in effect (meaning this test can be used) for the duration of the COVID-19 declaration under Section 564(b)(1) of the Act, 21 U.S.C.section 360bbb-3(b)(1), unless the authorization is terminated  or revoked sooner.       Influenza A by PCR 11/13/2020 NEGATIVE  NEGATIVE Final   Influenza B by PCR 11/13/2020 NEGATIVE  NEGATIVE Final   Comment: (NOTE) The Xpert Xpress SARS-CoV-2/FLU/RSV plus assay is intended as an aid in the diagnosis of influenza from Nasopharyngeal swab specimens and should not be used as a sole basis for treatment. Nasal washings and aspirates are unacceptable for Xpert Xpress SARS-CoV-2/FLU/RSV testing.  Fact Sheet for Patients: BloggerCourse.com  Fact Sheet for Healthcare Providers: SeriousBroker.it  This test is not yet approved or cleared by the [place] FDA and has been authorized for detection and/or diagnosis of SARS-CoV-2 by FDA under an  Emergency Use Authorization (EUA). This EUA will remain in effect (meaning this test can be used) for the duration of the COVID-19 declaration under Section 564(b)(1) of the Act, 21 U.S.C. section 360bbb-3(b)(1), unless the authorization is terminated or revoked.  Performed at [facility], [address], [place], [place] [address]   Admission on 10/12/2020, Discharged on 10/16/2020  Component Date Value Ref Range Status   Valproic Acid Lvl 10/12/2020 29 (A) 50.0 - 100.0 ug/mL Final   Performed at [facility], [address], [place], [place] [address]  Admission on 10/11/2020, Discharged on 10/12/2020  Component Date Value Ref Range Status   SARS Coronavirus 2 by RT PCR 10/11/2020 NEGATIVE  NEGATIVE Final   Comment: (NOTE) SARS-CoV-2 target nucleic acids are NOT DETECTED.  The SARS-CoV-2 RNA is generally detectable in upper respiratory specimens during the acute phase of infection. The lowest concentration of SARS-CoV-2 viral copies  this assay can detect is 138 copies/mL. A negative result does not preclude SARS-Cov-2 infection and should not be used as the sole basis for treatment or other patient management decisions. A negative result may occur with  improper specimen collection/handling, submission of specimen other than nasopharyngeal swab, presence of viral mutation(s) within the areas targeted by this assay, and inadequate number of viral copies(<138 copies/mL). A negative result must be combined with clinical observations, patient history, and epidemiological information. The expected result is Negative.  Fact Sheet for Patients:  BloggerCourse.com  Fact Sheet for Healthcare Providers:  SeriousBroker.it  This test is no                          t yet approved or cleared by the [place] FDA and  has been authorized for detection and/or diagnosis of SARS-CoV-2 by FDA under an Emergency Use  Authorization (EUA). This EUA will remain  in effect (meaning this test can be used) for the duration of the COVID-19 declaration under Section 564(b)(1) of the Act, 21 U.S.C.section 360bbb-3(b)(1), unless the authorization is terminated  or revoked sooner.       Influenza A by PCR 10/11/2020 NEGATIVE  NEGATIVE Final   Influenza B by PCR 10/11/2020 NEGATIVE  NEGATIVE Final   Comment: (NOTE) The Xpert Xpress SARS-CoV-2/FLU/RSV plus assay is intended as an aid in the diagnosis of influenza from Nasopharyngeal swab specimens and should not be used as a sole basis for treatment. Nasal washings and aspirates are unacceptable for Xpert Xpress SARS-CoV-2/FLU/RSV testing.  Fact Sheet for Patients: BloggerCourse.com  Fact Sheet for Healthcare Providers: SeriousBroker.it  This test is not yet approved or cleared by the [place] FDA and has been authorized for detection and/or diagnosis of SARS-CoV-2 by FDA under an Emergency Use Authorization (EUA). This EUA will remain in effect (meaning this test can be used) for the duration of the COVID-19 declaration under Section 564(b)(1) of the Act, 21 U.S.C. section 360bbb-3(b)(1), unless the authorization is terminated or revoked.  Performed at [facility], [address] [place], [place] [address]    Sodium 10/11/2020 132 (A) 135 - 145 mmol/L Final   Potassium 10/11/2020 3.5  3.5 - 5.1 mmol/L Final   Chloride 10/11/2020 99  98 - 111 mmol/L Final   CO2 10/11/2020 23  22 - 32 mmol/L Final   Glucose, Bld 10/11/2020 134 (A) 70 - 99 mg/dL Final   Glucose reference range applies only to samples taken after fasting for at least 8 hours.   BUN 10/11/2020 11  6 - 20 mg/dL Final   Creatinine, Ser 10/11/2020 0.78  0.61 - 1.24 mg/dL Final   Calcium 19/14/7829 9.0  8.9 - 10.3 mg/dL Final   Total Protein 56/21/3086 6.9  6.5 - 8.1 g/dL Final   Albumin 57/84/6962 4.2  3.5 - 5.0 g/dL Final    AST 95/28/4132 24  15 - 41 U/L Final   ALT 10/11/2020 21  0 - 44 U/L Final   Alkaline Phosphatase 10/11/2020 77  38 - 126 U/L Final   Total Bilirubin 10/11/2020 1.1  0.3 - 1.2 mg/dL Final   GFR, Estimated 10/11/2020 >60  >60 mL/min Final   Comment: (NOTE) Calculated using the CKD-EPI Creatinine Equation (2021)    Anion gap 10/11/2020 10  5 - 15 Final   Performed at [facility], [address], [place], [place] [address]   Alcohol, Ethyl (B) 10/11/2020 <10  <10  mg/dL Final   Comment: (NOTE) Lowest detectable limit for serum alcohol is 10 mg/dL.  For medical purposes only. Performed at [facility], [address] [place], [place] [address]    Opiates 10/11/2020 NONE DETECTED  NONE DETECTED Final   Cocaine 10/11/2020 NONE DETECTED  NONE DETECTED Final   Benzodiazepines 10/11/2020 NONE DETECTED  NONE DETECTED Final   Amphetamines 10/11/2020 NONE DETECTED  NONE DETECTED Final   Tetrahydrocannabinol 10/11/2020 NONE DETECTED  NONE DETECTED Final   Barbiturates 10/11/2020 NONE DETECTED  NONE DETECTED Final   Comment: (NOTE) DRUG SCREEN FOR MEDICAL PURPOSES ONLY.  IF CONFIRMATION IS NEEDED FOR ANY PURPOSE, NOTIFY LAB WITHIN 5 DAYS.  LOWEST DETECTABLE LIMITS FOR URINE DRUG SCREEN Drug Class                     Cutoff (ng/mL) Amphetamine and metabolites    1000 Barbiturate and metabolites    200 Benzodiazepine                 200 Tricyclics and metabolites     300 Opiates and metabolites        300 Cocaine and metabolites        300 THC                            50 Performed at [facility], [address], [place], [place] [address]    WBC 10/11/2020 10.3  4.0 - 10.5 K/uL Final   RBC 10/11/2020 4.48  4.22 - 5.81 MIL/uL Final   Hemoglobin 10/11/2020 13.7  13.0 - 17.0 g/dL Final   HCT 58/85/0277 39.6  39.0 - 52.0 % Final   MCV 10/11/2020 88.4  80.0 - 100.0 fL Final   MCH 10/11/2020 30.6  26.0 - 34.0 pg Final   MCHC 10/11/2020 34.6  30.0 - 36.0 g/dL Final    RDW 41/28/7867 12.6  11.5 - 15.5 % Final   Platelets 10/11/2020 362  150 - 400 K/uL Final   nRBC 10/11/2020 0.0  0.0 - 0.2 % Final   Neutrophils Relative % 10/11/2020 73  % Final   Neutro Abs 10/11/2020 7.4  1.7 - 7.7 K/uL Final   Lymphocytes Relative 10/11/2020 15  % Final   Lymphs Abs 10/11/2020 1.5  0.7 - 4.0 K/uL Final   Monocytes Relative 10/11/2020 10  % Final   Monocytes Absolute 10/11/2020 1.1 (A) 0.1 - 1.0 K/uL Final   Eosinophils Relative 10/11/2020 2  % Final   Eosinophils Absolute 10/11/2020 0.2  0.0 - 0.5 K/uL Final   Basophils Relative 10/11/2020 0  % Final   Basophils Absolute 10/11/2020 0.0  0.0 - 0.1 K/uL Final   Immature Granulocytes 10/11/2020 0  % Final   Abs Immature Granulocytes 10/11/2020 0.04  0.00 - 0.07 K/uL Final   Performed at [facility], [address] [place], [place] [address]   Salicylate Lvl 10/11/2020 <7.0 (A) 7.0 - 30.0 mg/dL Final   Performed at [facility], [address], [place], [place] [address]   Acetaminophen (Tylenol), Serum 10/11/2020 <10 (A) 10 - 30 ug/mL Final   Comment: (NOTE) Therapeutic concentrations vary significantly. A range of 10-30 ug/mL  may be an effective concentration for many patients. However, some  are best treated at concentrations outside of this range. Acetaminophen concentrations >150 ug/mL at 4 hours after ingestion  and >50 ug/mL at 12 hours after ingestion  are often associated with  toxic reactions.  Performed at [facility], [address] [place], [place] [address]   Admission on 10/06/2020, Discharged on 10/07/2020  Component Date Value Ref Range Status   WBC 10/06/2020 7.1  4.0 - 10.5 K/uL Final   RBC 10/06/2020 4.45  4.22 - 5.81 MIL/uL Final   Hemoglobin 10/06/2020 13.7  13.0 - 17.0 g/dL Final   HCT 60/45/4098 40.3  39.0 - 52.0 % Final   MCV 10/06/2020 90.6  80.0 - 100.0 fL Final   MCH 10/06/2020 30.8  26.0 - 34.0 pg Final   MCHC 10/06/2020 34.0  30.0 - 36.0 g/dL Final   RDW  11/91/4782 12.7  11.5 - 15.5 % Final   Platelets 10/06/2020 286  150 - 400 K/uL Final   nRBC 10/06/2020 0.0  0.0 - 0.2 % Final   Performed at [facility], [address], [place], [place] [address]   Salicylate Lvl 10/06/2020 <7.0 (A) 7.0 - 30.0 mg/dL Final   Performed at [facility], [address] [place], [place] [address]   Acetaminophen (Tylenol), Serum 10/06/2020 <10 (A) 10 - 30 ug/mL Final   Comment: (NOTE) Therapeutic concentrations vary significantly. A range of 10-30 ug/mL  may be an effective concentration for many patients. However, some  are best treated at concentrations outside of this range. Acetaminophen concentrations >150 ug/mL at 4 hours after ingestion  and >50 ug/mL at 12 hours after ingestion are often associated with  toxic reactions.  Performed at [facility], [address] [place], [place] [address]    Sodium 10/06/2020 137  135 - 145 mmol/L Final   Potassium 10/06/2020 3.1 (A) 3.5 - 5.1 mmol/L Final   Chloride 10/06/2020 100  98 - 111 mmol/L Final   CO2 10/06/2020 25  22 - 32 mmol/L Final   Glucose, Bld 10/06/2020 104 (A) 70 - 99 mg/dL Final   Glucose reference range applies only to samples taken after fasting for at least 8 hours.   BUN 10/06/2020 12  6 - 20 mg/dL Final   Creatinine, Ser 10/06/2020 0.62  0.61 - 1.24 mg/dL Final   Calcium 62/95/2841 8.9  8.9 - 10.3 mg/dL Final   Total Protein 32/44/0102 7.5  6.5 - 8.1 g/dL Final   Albumin 72/53/6644 4.9  3.5 - 5.0 g/dL Final   AST 03/47/4259 26  15 - 41 U/L Final   ALT 10/06/2020 31  0 - 44 U/L Final   Alkaline Phosphatase 10/06/2020 60  38 - 126 U/L Final   Total Bilirubin 10/06/2020 1.3 (A) 0.3 - 1.2 mg/dL Final   GFR, Estimated 10/06/2020 >60  >60 mL/min Final   Comment: (NOTE) Calculated using the CKD-EPI Creatinine Equation (2021)    Anion gap 10/06/2020 12  5 - 15 Final   Performed at [facility], [address], [place], [place] [address]   Alcohol, Ethyl (B) 10/06/2020 <10  <10 mg/dL Final   Comment: (NOTE) Lowest detectable limit for serum alcohol is 10 mg/dL.  For medical purposes only. Performed at [facility], [address] [place], [place] [address]    SARS Coronavirus 2 by RT PCR 10/06/2020 NEGATIVE  NEGATIVE Final   Comment: (NOTE) SARS-CoV-2 target nucleic acids are NOT DETECTED.  The SARS-CoV-2 RNA is generally detectable in upper respiratory specimens during the acute phase of infection. The lowest concentration of SARS-CoV-2 viral copies this assay can detect is 138 copies/mL. A  negative result does not preclude SARS-Cov-2 infection and should not be used as the sole basis for treatment or other patient management decisions. A negative result may occur with  improper specimen collection/handling, submission of specimen other than nasopharyngeal swab, presence of viral mutation(s) within the areas targeted by this assay, and inadequate number of viral copies(<138 copies/mL). A negative result must be combined with clinical observations, patient history, and epidemiological information. The expected result is Negative.  Fact Sheet for Patients:  BloggerCourse.com  Fact Sheet for Healthcare Providers:  SeriousBroker.it  This test is no                          t yet approved or cleared by the [place] FDA and  has been authorized for detection and/or diagnosis of SARS-CoV-2 by FDA under an Emergency Use Authorization (EUA). This EUA will remain  in effect (meaning this test can be used) for the duration of the COVID-19 declaration under Section 564(b)(1) of the Act, 21 U.S.C.section 360bbb-3(b)(1), unless the authorization is terminated  or revoked sooner.       Influenza A by PCR 10/06/2020 NEGATIVE  NEGATIVE Final   Influenza B by PCR 10/06/2020 NEGATIVE  NEGATIVE Final   Comment: (NOTE) The  Xpert Xpress SARS-CoV-2/FLU/RSV plus assay is intended as an aid in the diagnosis of influenza from Nasopharyngeal swab specimens and should not be used as a sole basis for treatment. Nasal washings and aspirates are unacceptable for Xpert Xpress SARS-CoV-2/FLU/RSV testing.  Fact Sheet for Patients: BloggerCourse.com  Fact Sheet for Healthcare Providers: SeriousBroker.it  This test is not yet approved or cleared by the [place] FDA and has been authorized for detection and/or diagnosis of SARS-CoV-2 by FDA under an Emergency Use Authorization (EUA). This EUA will remain in effect (meaning this test can be used) for the duration of the COVID-19 declaration under Section 564(b)(1) of the Act, 21 U.S.C. section 360bbb-3(b)(1), unless the authorization is terminated or revoked.  Performed at [facility], [address] [place], [place] [address]    Opiates 10/07/2020 NONE DETECTED  NONE DETECTED Final   Cocaine 10/07/2020 NONE DETECTED  NONE DETECTED Final   Benzodiazepines 10/07/2020 NONE DETECTED  NONE DETECTED Final   Amphetamines 10/07/2020 POSITIVE (A) NONE DETECTED Final   Tetrahydrocannabinol 10/07/2020 NONE DETECTED  NONE DETECTED Final   Barbiturates 10/07/2020 NONE DETECTED  NONE DETECTED Final   Comment: (NOTE) DRUG SCREEN FOR MEDICAL PURPOSES ONLY.  IF CONFIRMATION IS NEEDED FOR ANY PURPOSE, NOTIFY LAB WITHIN 5 DAYS.  LOWEST DETECTABLE LIMITS FOR URINE DRUG SCREEN Drug Class                     Cutoff (ng/mL) Amphetamine and metabolites    1000 Barbiturate and metabolites    200 Benzodiazepine                 200 Tricyclics and metabolites     300 Opiates and metabolites        300 Cocaine and metabolites        300 THC                            50 Performed at [facility], [address] [place], [place] [address]   Admission on 09/25/2020, Discharged on 10/01/2020   Component Date Value Ref Range Status   Cholesterol 09/27/2020 161  0 -  200 mg/dL Final   Triglycerides 16/12/9602 156 (A) <150 mg/dL Final   HDL 54/11/8117 52  >40 mg/dL Final   Total CHOL/HDL Ratio 09/27/2020 3.1  RATIO Final   VLDL 09/27/2020 31  0 - 40 mg/dL Final   LDL Cholesterol 09/27/2020 78  0 - 99 mg/dL Final   Comment:        Total Cholesterol/HDL:CHD Risk Coronary Heart Disease Risk Table                     Men   Women  1/2 Average Risk   3.4   3.3  Average Risk       5.0   4.4  2 X Average Risk   9.6   7.1  3 X Average Risk  23.4   11.0        Use the calculated Patient Ratio above and the CHD Risk Table to determine the patient's CHD Risk.        ATP III CLASSIFICATION (LDL):  <100     mg/dL   Optimal  147-829  mg/dL   Near or Above                    Optimal  130-159  mg/dL   Borderline  562-130  mg/dL   High  >865     mg/dL   Very High Performed at [facility], [address] [place], [place] [address]    Hgb A1c MFr Bld 09/27/2020 5.2  4.8 - 5.6 % Final   Comment: (NOTE)         Prediabetes: 5.7 - 6.4         Diabetes: >6.4         Glycemic control for adults with diabetes: <7.0    Mean Plasma Glucose 09/27/2020 103  mg/dL Final   Comment: (NOTE) Performed At: [facility] [address] [place], [place] [id number] Jolene Schimke MD KG:[id number]    WBC 10/01/2020 8.8  4.0 - 10.5 K/uL Final   RBC 10/01/2020 4.64  4.22 - 5.81 MIL/uL Final   Hemoglobin 10/01/2020 14.2  13.0 - 17.0 g/dL Final   HCT 64/40/3474 42.5  39.0 - 52.0 % Final   MCV 10/01/2020 91.6  80.0 - 100.0 fL Final   MCH 10/01/2020 30.6  26.0 - 34.0 pg Final   MCHC 10/01/2020 33.4  30.0 - 36.0 g/dL Final   RDW 25/95/6387 12.9  11.5 - 15.5 % Final   Platelets 10/01/2020 271  150 - 400 K/uL Final   nRBC 10/01/2020 0.0  0.0 - 0.2 % Final   Neutrophils Relative % 10/01/2020 56  % Final   Neutro Abs 10/01/2020 4.9  1.7 - 7.7 K/uL Final   Lymphocytes Relative  10/01/2020 24  % Final   Lymphs Abs 10/01/2020 2.1  0.7 - 4.0 K/uL Final   Monocytes Relative 10/01/2020 9  % Final   Monocytes Absolute 10/01/2020 0.8  0.1 - 1.0 K/uL Final   Eosinophils Relative 10/01/2020 6  % Final   Eosinophils Absolute 10/01/2020 0.5  0.0 - 0.5 K/uL Final   Basophils Relative 10/01/2020 1  % Final   Basophils Absolute 10/01/2020 0.1  0.0 - 0.1 K/uL Final   Immature Granulocytes 10/01/2020 4  % Final   Abs Immature Granulocytes 10/01/2020 0.33 (A) 0.00 - 0.07 K/uL Final   Performed at [facility], [address], [place], [place] [address]   Sodium 10/01/2020 140  135 -  145 mmol/L Final   Potassium 10/01/2020 4.0  3.5 - 5.1 mmol/L Final   Chloride 10/01/2020 105  98 - 111 mmol/L Final   CO2 10/01/2020 22  22 - 32 mmol/L Final   Glucose, Bld 10/01/2020 90  70 - 99 mg/dL Final   Glucose reference range applies only to samples taken after fasting for at least 8 hours.   BUN 10/01/2020 16  6 - 20 mg/dL Final   Creatinine, Ser 10/01/2020 0.67  0.61 - 1.24 mg/dL Final   Calcium 57/32/2025 9.2  8.9 - 10.3 mg/dL Final   Total Protein 42/70/6237 6.9  6.5 - 8.1 g/dL Final   Albumin 62/83/1517 4.4  3.5 - 5.0 g/dL Final   AST 61/60/7371 32  15 - 41 U/L Final   ALT 10/01/2020 31  0 - 44 U/L Final   Alkaline Phosphatase 10/01/2020 51  38 - 126 U/L Final   Total Bilirubin 10/01/2020 0.5  0.3 - 1.2 mg/dL Final   GFR, Estimated 10/01/2020 >60  >60 mL/min Final   Comment: (NOTE) Calculated using the CKD-EPI Creatinine Equation (2021)    Anion gap 10/01/2020 13  5 - 15 Final   Performed at [facility], [address], [place], [place] [address]   Valproic Acid Lvl 10/01/2020 66  50.0 - 100.0 ug/mL Final   Performed at [facility], [address], [place], [place] [address]  Admission on 09/21/2020, Discharged on 09/25/2020  Component Date Value Ref Range Status   Sodium 09/21/2020 139  135 - 145 mmol/L Final    Potassium 09/21/2020 4.0  3.5 - 5.1 mmol/L Final   Chloride 09/21/2020 103  98 - 111 mmol/L Final   CO2 09/21/2020 25  22 - 32 mmol/L Final   Glucose, Bld 09/21/2020 111 (A) 70 - 99 mg/dL Final   Glucose reference range applies only to samples taken after fasting for at least 8 hours.   BUN 09/21/2020 15  6 - 20 mg/dL Final   Creatinine, Ser 09/21/2020 0.95  0.61 - 1.24 mg/dL Final   Calcium 27/05/5007 9.4  8.9 - 10.3 mg/dL Final   Total Protein 38/18/2993 6.8  6.5 - 8.1 g/dL Final   Albumin 71/69/6789 4.4  3.5 - 5.0 g/dL Final   AST 38/12/1749 15  15 - 41 U/L Final   ALT 09/21/2020 13  0 - 44 U/L Final   Alkaline Phosphatase 09/21/2020 62  38 - 126 U/L Final   Total Bilirubin 09/21/2020 0.6  0.3 - 1.2 mg/dL Final   GFR, Estimated 09/21/2020 >60  >60 mL/min Final   Comment: (NOTE) Calculated using the CKD-EPI Creatinine Equation (2021)    Anion gap 09/21/2020 11  5 - 15 Final   Performed at [facility], [address], [place], [place] [address]   Alcohol, Ethyl (B) 09/21/2020 <10  <10 mg/dL Final   Comment: (NOTE) Lowest detectable limit for serum alcohol is 10 mg/dL.  For medical purposes only. Performed at [facility], [address], [place], [place] [address]    Salicylate Lvl 09/21/2020 <7.0 (A) 7.0 - 30.0 mg/dL Final   Performed at [facility], [address], [place], [place] [address]   Acetaminophen (Tylenol), Serum 09/21/2020 <10 (A) 10 - 30 ug/mL Final   Comment: (NOTE) Therapeutic concentrations vary significantly. A range of 10-30 ug/mL  may be an effective concentration for many patients. However, some  are best treated at concentrations outside of this range.  Acetaminophen concentrations >150 ug/mL at 4 hours after ingestion  and >50 ug/mL at 12 hours after ingestion are often associated with  toxic reactions.  Performed at [facility], [address], [place], [place] [address]    WBC 09/21/2020 10.5  4.0 - 10.5 K/uL Final   RBC  09/21/2020 4.58  4.22 - 5.81 MIL/uL Final   Hemoglobin 09/21/2020 13.9  13.0 - 17.0 g/dL Final   HCT 19/14/7829 40.0  39.0 - 52.0 % Final   MCV 09/21/2020 87.3  80.0 - 100.0 fL Final   MCH 09/21/2020 30.3  26.0 - 34.0 pg Final   MCHC 09/21/2020 34.8  30.0 - 36.0 g/dL Final   RDW 56/21/3086 12.3  11.5 - 15.5 % Final   Platelets 09/21/2020 292  150 - 400 K/uL Final   nRBC 09/21/2020 0.0  0.0 - 0.2 % Final   Performed at [facility], [address], [place], [place] [address]   Opiates 09/21/2020 NONE DETECTED  NONE DETECTED Final   Cocaine 09/21/2020 NONE DETECTED  NONE DETECTED Final   Benzodiazepines 09/21/2020 NONE DETECTED  NONE DETECTED Final   Amphetamines 09/21/2020 NONE DETECTED  NONE DETECTED Final   Tetrahydrocannabinol 09/21/2020 NONE DETECTED  NONE DETECTED Final   Barbiturates 09/21/2020 NONE DETECTED  NONE DETECTED Final   Comment: (NOTE) DRUG SCREEN FOR MEDICAL PURPOSES ONLY.  IF CONFIRMATION IS NEEDED FOR ANY PURPOSE, NOTIFY LAB WITHIN 5 DAYS.  LOWEST DETECTABLE LIMITS FOR URINE DRUG SCREEN Drug Class                     Cutoff (ng/mL) Amphetamine and metabolites    1000 Barbiturate and metabolites    200 Benzodiazepine                 200 Tricyclics and metabolites     300 Opiates and metabolites        300 Cocaine and metabolites        300 THC                            50 Performed at [facility], [address], [place], [place] [address]    SARS Coronavirus 2 by RT PCR 09/21/2020 NEGATIVE  NEGATIVE Final   Comment: (NOTE) SARS-CoV-2 target nucleic acids are NOT DETECTED.  The SARS-CoV-2 RNA is generally detectable in upper respiratory specimens during the acute phase of infection. The lowest concentration of SARS-CoV-2 viral copies this assay can detect is 138 copies/mL. A negative result does not preclude SARS-Cov-2 infection and should not be used as the sole basis for treatment or other patient management decisions. A negative result  may occur with  improper specimen collection/handling, submission of specimen other than nasopharyngeal swab, presence of viral mutation(s) within the areas targeted by this assay, and inadequate number of viral copies(<138 copies/mL). A negative result must be combined with clinical observations, patient history, and epidemiological information. The expected result is Negative.  Fact Sheet for Patients:  BloggerCourse.com  Fact Sheet for Healthcare Providers:  SeriousBroker.it  This test is no                          t yet approved or cleared by the [place] FDA and  has been authorized for detection and/or diagnosis of SARS-CoV-2 by FDA under an Emergency Use Authorization (EUA). This EUA will remain  in  effect (meaning this test can be used) for the duration of the COVID-19 declaration under Section 564(b)(1) of the Act, 21 U.S.C.section 360bbb-3(b)(1), unless the authorization is terminated  or revoked sooner.       Influenza A by PCR 09/21/2020 NEGATIVE  NEGATIVE Final   Influenza B by PCR 09/21/2020 NEGATIVE  NEGATIVE Final   Comment: (NOTE) The Xpert Xpress SARS-CoV-2/FLU/RSV plus assay is intended as an aid in the diagnosis of influenza from Nasopharyngeal swab specimens and should not be used as a sole basis for treatment. Nasal washings and aspirates are unacceptable for Xpert Xpress SARS-CoV-2/FLU/RSV testing.  Fact Sheet for Patients: BloggerCourse.com  Fact Sheet for Healthcare Providers: SeriousBroker.it  This test is not yet approved or cleared by the [place] FDA and has been authorized for detection and/or diagnosis of SARS-CoV-2 by FDA under an Emergency Use Authorization (EUA). This EUA will remain in effect (meaning this test can be used) for the duration of the COVID-19 declaration under Section 564(b)(1) of the Act, 21 U.S.C. section  360bbb-3(b)(1), unless the authorization is terminated or revoked.  Performed at [facility], [address] [place], [place] [address]    Color, Urine 09/21/2020 YELLOW  YELLOW Final   APPearance 09/21/2020 CLEAR  CLEAR Final   Specific Gravity, Urine 09/21/2020 1.014  1.005 - 1.030 Final   pH 09/21/2020 7.0  5.0 - 8.0 Final   Glucose, UA 09/21/2020 NEGATIVE  NEGATIVE mg/dL Final   Hgb urine dipstick 09/21/2020 NEGATIVE  NEGATIVE Final   Bilirubin Urine 09/21/2020 NEGATIVE  NEGATIVE Final   Ketones, ur 09/21/2020 NEGATIVE  NEGATIVE mg/dL Final   Protein, ur 26/20/3559 NEGATIVE  NEGATIVE mg/dL Final   Nitrite 74/16/3845 NEGATIVE  NEGATIVE Final   Leukocytes,Ua 09/21/2020 NEGATIVE  NEGATIVE Final   Performed at [facility], [address], [place], [place] [address]   Lithium Lvl 09/23/2020 0.39 (A) 0.60 - 1.20 mmol/L Final   Performed at [facility], [address] [place], [place] [address]   Sodium 09/23/2020 140  135 - 145 mmol/L Final   Potassium 09/23/2020 3.9  3.5 - 5.1 mmol/L Final   Chloride 09/23/2020 105  98 - 111 mmol/L Final   BUN 09/23/2020 14  6 - 20 mg/dL Final   Creatinine, Ser 09/23/2020 0.80  0.61 - 1.24 mg/dL Final   Glucose, Bld 24/82/5003 96  70 - 99 mg/dL Final   Glucose reference range applies only to samples taken after fasting for at least 8 hours.   Calcium, Ion 09/23/2020 1.22  1.15 - 1.40 mmol/L Final   TCO2 09/23/2020 26  22 - 32 mmol/L Final   Hemoglobin 09/23/2020 14.3  13.0 - 17.0 g/dL Final   HCT 70/48/8891 42.0  39.0 - 52.0 % Final   Sodium 09/23/2020 138  135 - 145 mmol/L Final   Potassium 09/23/2020 4.1  3.5 - 5.1 mmol/L Final   Chloride 09/23/2020 105  98 - 111 mmol/L Final   CO2 09/23/2020 25  22 - 32 mmol/L Final   Glucose, Bld 09/23/2020 99  70 - 99 mg/dL Final   Glucose reference range applies only to samples taken after fasting for at least 8 hours.   BUN 09/23/2020 13  6 - 20 mg/dL Final   Creatinine, Ser  09/23/2020 0.91  0.61 - 1.24 mg/dL Final   Calcium 69/45/0388 9.2  8.9 - 10.3 mg/dL Final   Total Protein 82/80/0349 6.2 (A) 6.5 - 8.1 g/dL Final   Albumin  09/23/2020 4.0  3.5 - 5.0 g/dL Final   AST 40/98/1191 13 (A) 15 - 41 U/L Final   ALT 09/23/2020 11  0 - 44 U/L Final   Alkaline Phosphatase 09/23/2020 49  38 - 126 U/L Final   Total Bilirubin 09/23/2020 0.7  0.3 - 1.2 mg/dL Final   GFR, Estimated 09/23/2020 >60  >60 mL/min Final   Comment: (NOTE) Calculated using the CKD-EPI Creatinine Equation (2021)    Anion gap 09/23/2020 8  5 - 15 Final   Performed at [facility], [address] [place], [place] [address]   WBC 09/23/2020 9.6  4.0 - 10.5 K/uL Final   RBC 09/23/2020 4.84  4.22 - 5.81 MIL/uL Final   Hemoglobin 09/23/2020 14.8  13.0 - 17.0 g/dL Final   HCT 56/21/3086 43.4  39.0 - 52.0 % Final   MCV 09/23/2020 89.7  80.0 - 100.0 fL Final   MCH 09/23/2020 30.6  26.0 - 34.0 pg Final   MCHC 09/23/2020 34.1  30.0 - 36.0 g/dL Final   RDW 57/84/6962 12.4  11.5 - 15.5 % Final   Platelets 09/23/2020 291  150 - 400 K/uL Final   nRBC 09/23/2020 0.0  0.0 - 0.2 % Final   Performed at [facility], [address], [place], [place] [address]   Glucose-Capillary 09/23/2020 100 (A) 70 - 99 mg/dL Final   Glucose reference range applies only to samples taken after fasting for at least 8 hours.   Comment 1 09/23/2020 Notify RN   Final   Troponin I (High Sensitivity) 09/23/2020 3  <18 ng/L Final   Comment: (NOTE) Elevated high sensitivity troponin I (hsTnI) values and significant  changes across serial measurements may suggest ACS but many other  chronic and acute conditions are known to elevate hsTnI results.  Refer to the "Links" section for chest pain algorithms and additional  guidance. Performed at [facility], [address], [place], [place] [address]    Magnesium 09/23/2020 2.3  1.7 - 2.4 mg/dL Final   Performed at [facility], [address],  [place], [place] [address]   Phosphorus 09/23/2020 3.5  2.5 - 4.6 mg/dL Final   Performed at [facility], [address], [place], [place] [address]   TSH 09/23/2020 1.957  0.350 - 4.500 uIU/mL Final   Comment: Performed by a 3rd Generation assay with a functional sensitivity of <=0.01 uIU/mL. Performed at [facility], [address] [place], [place] [address]    T3, Free 09/23/2020 3.3  2.0 - 4.4 pg/mL Final   Comment: (NOTE) Performed At: [facility] [address] [place], [place] [id number] Jolene Schimke MD [id number]    SARS Coronavirus 2 09/24/2020 NEGATIVE  NEGATIVE Final   Comment: (NOTE) SARS-CoV-2 target nucleic acids are NOT DETECTED.  The SARS-CoV-2 RNA is generally detectable in upper and lower respiratory specimens during the acute phase of infection. Negative results do not preclude SARS-CoV-2 infection, do not rule out co-infections with other pathogens, and should not be used as the sole basis for treatment or other patient management decisions. Negative results must be combined with clinical observations, patient history, and epidemiological information. The expected result is Negative.  Fact Sheet for Patients: HairSlick.no  Fact Sheet for Healthcare Providers: quierodirigir.com  This test is not yet approved or cleared by the [place] FDA and  has been authorized for detection and/or diagnosis of SARS-CoV-2 by FDA under an Emergency Use Authorization (  EUA). This EUA will remain  in effect (meaning this test can be used) for the duration of the COVID-19 declaration under Se                          ction 564(b)(1) of the Act, 21 U.S.C. section 360bbb-3(b)(1), unless the authorization is terminated or revoked sooner.  Performed at [facility], [address], [place], [place] [address]     Blood Alcohol level:  Lab Results  Component Value Date   ETH <10 11/13/2020    ETH <10 10/11/2020    Metabolic Disorder Labs: Lab Results  Component Value Date   HGBA1C 5.2 09/27/2020   MPG 103 09/27/2020   MPG 102.54 01/24/2020   No results found for: PROLACTIN Lab Results  Component Value Date   CHOL 161 09/27/2020   TRIG 156 (H) 09/27/2020   HDL 52 09/27/2020   CHOLHDL 3.1 09/27/2020   VLDL 31 09/27/2020   LDLCALC 78 09/27/2020   LDLCALC 120 (H) 01/24/2020    Therapeutic Lab Levels: Lab Results  Component Value Date   LITHIUM 0.39 (L) 09/23/2020   LITHIUM 0.38 (L) 01/27/2020   Lab Results  Component Value Date   VALPROATE 29 (L) 10/12/2020   VALPROATE 66 10/01/2020   No components found for:  CBMZ  Physical Findings   AIMS    Flowsheet Row [facility]  AIMS Total Score 0      AUDIT    Flowsheet Row [facility] Admission (Discharged[facility]  Alcohol Use Disorder Identification Test Final Score (AUDIT) 0 8      Flowsheet Row ED from 11/14/2020 in [facility] ED from 11[facility]13/2020 in [facility] EMERG[facility] ED from 10/27/2020 in [facility]  C-SSRS RISK CATEGORY High Risk High Risk Error: Question 6 not populated        Musculoskeletal  Strength & Muscle Tone: within normal limits Gait & Station: normal Patient leans: N/A  Psychiatric Specialty Exam  Presentation  General Appearance: Appropriate for Environment  Eye Contact:Fair  Speech:Clear and Coherent  Speech Volume:Normal  Handedness:Right   Mood and Affect  Mood:Euthymic  Affect:Congruent   Thought Process  Thought Processes:Coherent; Goal Directed  Descriptions of Associations:Intact  Orientation:Full (Time, Place and Person)  Thought Content:Logical  Diagnosis  of Schizophrenia or Schizoaffective disorder in past: No    Hallucinations:Hallucinations: None  Ideas of Reference:None  Suicidal Thoughts:Suicidal Thoughts: No  Homicidal Thoughts:Homicidal Thoughts: No   Sensorium  Memory:Immediate Fair; Recent Fair; Remote Fair  Judgment:Fair  Insight:Fair   Executive Functions  Concentration:Fair  Attention Span:Fair  Recall:Fair  Fund of Knowledge:Fair  Language:Fair   Psychomotor Activity  Psychomotor Activity:Psychomotor Activity: Normal   Assets  Assets:Communication Skills; Desire for Improvement; Physical Health   Sleep  Sleep:Sleep: Good   No data recorded  Physical Exam  Physical Exam HENT:     Head: Normocephalic.     Nose: Nose normal.  Eyes:     Conjunctiva/sclera: Conjunctivae normal.  Cardiovascular:     Rate and Rhythm: Normal rate.  Pulmonary:     Effort: Pulmonary effort is normal.  Musculoskeletal:        General: Normal range of motion.     Cervical back: Normal range of motion.  Neurological:  Mental Status: He is alert and oriented to person, place, and time.   Review of Systems  Constitutional: Negative.   HENT: Negative.    Eyes: Negative.   Respiratory: Negative.    Cardiovascular: Negative.   Gastrointestinal: Negative.   Genitourinary: Negative.   Musculoskeletal: Negative.   Skin: Negative.   Neurological: Negative.   Endo/Heme/Allergies: Negative.   Blood pressure 114/67, pulse 68, temperature 97.7 F (36.5 C), temperature source Temporal, resp. rate 16, height 5\' 10"  (1.778 m), weight 164 lb 14.5 oz (74.8 kg), SpO2 98 %. Body mass index is 23.66 kg/m.  Treatment Plan Summary: Bipolar I disorder - continue Olanzapine 10mg  qhs - Continue Prozac 20mg  daily   Insomnia -vistaril to 50 mg qhs prn for insomnia -Trazodone 50 mg po QHS PRN  Labs and vital signs reviewed.   Dispo - Going to residential program on 9/19 at 0900. Patient will need to get 14 day  samples for all medications prior to discharge  *per nursing staff, pt has sample meds and printed prescription on his chart.  , NP 11/18/2020 8:34 AM

## 2020-11-19 DIAGNOSIS — F419 Anxiety disorder, unspecified: Secondary | ICD-10-CM | POA: Diagnosis not present

## 2020-11-19 DIAGNOSIS — F319 Bipolar disorder, unspecified: Secondary | ICD-10-CM | POA: Diagnosis not present

## 2020-11-19 DIAGNOSIS — F17210 Nicotine dependence, cigarettes, uncomplicated: Secondary | ICD-10-CM | POA: Diagnosis not present

## 2020-11-19 DIAGNOSIS — F1290 Cannabis use, unspecified, uncomplicated: Secondary | ICD-10-CM | POA: Diagnosis not present

## 2020-11-19 MED ORDER — TRAZODONE HCL 50 MG PO TABS: 50.0000 mg | ORAL_TABLET | Freq: Every day | ORAL | 1 refills | Status: DC

## 2020-11-19 MED ORDER — ALBUTEROL SULFATE HFA 108 (90 BASE) MCG/ACT IN AERS: 1.0000 | INHALATION_SPRAY | RESPIRATORY_TRACT | 1 refills | Status: DC | PRN

## 2020-11-19 NOTE — Discharge Instructions (Signed)
Take all medications as prescribed by his/her mental healthcare provider. Report any adverse effects and or reactions from the medicines to his/her outpatient provider promptly. Do not engage in alcohol and or illegal drug use while on prescription medicines. In the event of worsening symptoms, patient is instructed to call the crisis hotline, 911 and or go to the nearest ED for appropriate evaluation and treatment of symptoms. To follow-up with his/her primary care provider for your other medical issues, concerns and or health care needs.    

## 2020-11-19 NOTE — ED Provider Notes (Signed)
[facility] Discharge Suicide Risk Assessment   Principal Problem: <principal problem not specified> Discharge Diagnoses: Active Problems:   Bipolar 1 disorder (HCC)   Total Time spent with patient: 15 minutes  Musculoskeletal: Strength & Muscle Tone: within normal limits Gait & Station: normal Patient leans: N/A  Psychiatric Specialty Exam  Presentation  General Appearance: Appropriate for Environment; Casual  Eye Contact:Good  Speech:Clear and Coherent; Normal Rate  Speech Volume:Normal  Handedness:Right   Mood and Affect  Mood:Euthymic  Duration of Depression Symptoms: Greater than two weeks  Affect:Appropriate; Congruent (brighter than previous)   Thought Process  Thought Processes:Coherent; Goal Directed; Linear  Descriptions of Associations:Intact  Orientation:Full (Time, Place and Person)  Thought Content:WDL; Logical  History of Schizophrenia/Schizoaffective disorder:No  Duration of Psychotic Symptoms:N/A  Hallucinations:Hallucinations: None  Ideas of Reference:None  Suicidal Thoughts:Suicidal Thoughts: No  Homicidal Thoughts:Homicidal Thoughts: No   Sensorium  Memory:Immediate Good; Recent Good; Remote Good  Judgment:Good  Insight:Good   Executive Functions  Concentration:Good  Attention Span:Good  Recall:Good  Fund of Knowledge:Good  Language:Good   Psychomotor Activity  Psychomotor Activity:Psychomotor Activity: Normal   Assets  Assets:Communication Skills; Desire for Improvement; Resilience; Physical Health   Sleep  Sleep:Sleep: Fair   Physical Exam: Physical Exam Constitutional:      Appearance: Normal appearance. He is normal weight.  HENT:     Head: Normocephalic and atraumatic.  Eyes:     Extraocular Movements: Extraocular movements intact.  Pulmonary:     Effort: Pulmonary effort is normal.  Neurological:     General: No focal deficit present.     Mental Status: He is alert.   Review of Systems   Constitutional:  Negative for chills and fever.  HENT:  Negative for hearing loss.   Eyes:  Negative for discharge and redness.  Respiratory:  Negative for cough.   Cardiovascular:  Negative for chest pain.  Gastrointestinal:  Negative for abdominal pain.  Musculoskeletal:  Negative for myalgias.  Neurological:  Negative for headaches.  Psychiatric/Behavioral:  Negative for depression, hallucinations and suicidal ideas.   Blood pressure 107/64, pulse 72, temperature 98.7 F (37.1 C), temperature source Oral, resp. rate 16, height 5\' 10"  (1.778 m), weight 74.8 kg, SpO2 99 %. Body mass index is 23.66 kg/m.  Mental Status Per Nursing Assessment::   On Admission:     Demographic Factors:  Male, Caucasian, Low socioeconomic status, and Living alone  Loss Factors: Financial problems/change in socioeconomic status  Historical Factors: Prior suicide attempts and Impulsivity  Risk Reduction Factors:   Positive coping skills or problem solving skills and future focused (looking forward to receiving substance treatment and then hoping to go to a half way house) , family  Continued Clinical Symptoms:  Alcohol/Substance Abuse/Dependencies Previous Psychiatric Diagnoses and Treatments  Cognitive Features That Contribute To Risk:  None    Suicide Risk:  Minimal: No identifiable suicidal ideation.  Patients presenting with no risk factors but with morbid ruminations; may be classified as minimal risk based on the severity of the depressive symptoms   Follow-up Information     Services, [facility]. Go on 11/19/2020.   Why: Your screening for admission appointment is Monday, 11/19/20 at 9:00am. Please be prepared to provide any discharge paperwork from this encounter. Also, please ensure you have your 14 day supply of medications, with your 30 day prescription. Contact information: 11/21/20 [place] [phone]         [facility]. Go to.  Specialty: Behavioral Health Why: Please go during walk-in hours to establish outpatient medication management and therapy services to ensure continuity of care.   Medication Management Walk-in Hours: Monday-Friday from 8:00am-11:00am. Please arrive between 7:30am-7:45am , as patients are seen on a first come, first served basis.   Therapy Walk-In hours: Monday-Wednesday 8:00am-until slots are full. Please arrive between 7:30am-7:45am , as patients are seen on a first come, first served basis. Friday from 1:00pm-5:00pm. Contact information: [phone] [place] [address] [phone]                Plan Of Care/Follow-up recommendations:  Activity:  as tolerated Diet:  regular Other:      Patient is instructed prior to discharge to: Take all medications as prescribed by his/her mental healthcare provider. Report any adverse effects and or reactions from the medicines to his/her outpatient provider promptly. Patient has been instructed & cautioned: To not engage in alcohol and or illegal drug use while on prescription medicines. In the event of worsening symptoms, patient is instructed to call the crisis hotline, 911 and or go to the nearest ED for appropriate evaluation and treatment of symptoms. To follow-up with his/her primary care provider for your other medical issues, concerns and or health care needs.    Patient discharged to [facility] with 14 day samples of medications and scripts. Patient discharged via safe transport  Estella Husk, MD 11/19/2020, 12:08 PM

## 2020-11-19 NOTE — ED Notes (Signed)
Pt ambulatory, alert, and oriented X4. Pt denies SI/HI and AVH, and any pain. AVS, prescriptions, medication, and follow-up appointments reviewed with pt. Pt verbalized his understanding of the AVS and a copy of the AVS was given to the pt. Pt's belongings were returned and belongings sheet was signed by pt. Pt was discharge to the lobby to [facility] to be transported to [facility].

## 2020-11-19 NOTE — ED Provider Notes (Signed)
FBC/OBS ASAP Discharge Summary  Date and Time: 11/19/2020 8:12 AM  Name: Brett House  MRN:  [id number]   Discharge Diagnoses:  Final diagnoses:  Bipolar I disorder (HCC)    Subjective: Patient seen and chart reviewed. Patient interviewed in his room this AM and he reports doing well.He reports having a good weekend and sleeping well while at the [facility] He rates his current mood as 7/10. He denies SI/HI/AVH. He expresses excitement about going to rehab for treatment. He states that he found the AA meetings and staff members helpful during his stay.  Stay Summary:  Patient presented from [facility] ED to [facility] for worsening depression with a plan to jump in front of a train on 9/13. He was initially IVC'd in the ED but this was rescinded upon arrival to the [facility] and he was admitted to the [facility] for continued treatment. He was started on olanzapine 10 mg on 9/14; depakote was mistakenly stated at this time as well but was later discontinued. Discussed error with patient and patient was understanding. Prozac 20 mg on 9/15. He tolerated medications well without SE. On 9/16 he reported difficulties with sleep and vistaril was increased to 50 mg qhs and albuterol inhaler was ordered as this was a previous medication and he reported some wheezing. He was started on trazodone 9/17 to assist with insomnia; this had previously been discussed however patient declined reporting that it made him feel congested. Patient was accepted to [facility] on 9/19 for substance use treatment. UDS negative this presentation; however, he reported urges and cravings and was concerned about relapse (reported amphetamine use; UDS+amphetamines 10/07/20). He was provided with 14 day samples and scripts for medications. See above for day of discharge interview. Throughout stay patient was medication compliant and was appropriate with peers and staff and was not a management issue on the unit.  Total Time spent with patient: 15 minutes  Past  Psychiatric History: see H&P Past Medical History:  Past Medical History:  Diagnosis Date   Anxiety    Asthma    Bipolar disorder (HCC)    Depression    No past surgical history on file. Family History: No family history on file. Family Psychiatric History: see H&P Social History:  Social History   Substance and Sexual Activity  Alcohol Use Yes   Comment: 6 pack once a week for last 8-9 months     Social History   Substance and Sexual Activity  Drug Use Yes   Types: Marijuana   Comment: rarely    Social History   Socioeconomic History   Marital status: Single    Spouse name: Not on file   Number of children: 0   Years of education: Not on file   Highest education level: 12th grade  Occupational History   Occupation: Unemployed  Tobacco Use   Smoking status: Every Day    Packs/day: 1.50    Years: 15.00    Pack years: 22.50    Types: Cigarettes   Smokeless tobacco: Current  Vaping Use   Vaping Use: Never used  Substance and Sexual Activity   Alcohol use: Yes    Comment: 6 pack once a week for last 8-9 months   Drug use: Yes    Types: Marijuana    Comment: rarely   Sexual activity: Yes  Other Topics Concern   Not on file  Social History Narrative   Not on file   Social Determinants of Health   Financial Resource Strain: Not on  file  Food Insecurity: Not on file  Transportation Needs: Not on file  Physical Activity: Not on file  Stress: Not on file  Social Connections: Not on file   SDOH:  SDOH Screenings   Alcohol Screen: Low Risk    Last Alcohol Screening Score (AUDIT): 0  Depression (PHQ2-9): Not on file  Financial Resource Strain: Not on file  Food Insecurity: Not on file  Housing: Not on file  Physical Activity: Not on file  Social Connections: Not on file  Stress: Not on file  Tobacco Use: High Risk   Smoking Tobacco Use: Every Day   Smokeless Tobacco Use: Current  Transportation Needs: Not on file    Tobacco Cessation:  A  prescription for an FDA-approved tobacco cessation medication provided at discharge  Current Medications:  Current Facility-Administered Medications  Medication Dose Route Frequency Provider Last Rate Last Admin   acetaminophen (TYLENOL) tablet 650 mg  650 mg Oral Q6H PRN [name] A, NP       albuterol (VENTOLIN HFA) 108 (90 Base) MCG/ACT inhaler 1-2 puff  1-2 puff Inhalation Q4H PRN Estella Husk, MD       alum & mag hydroxide-simeth (MAALOX/MYLANTA) 200-200-20 MG/5ML suspension 30 mL  30 mL Oral Q4H PRN [name] A, NP       feeding supplement (ENSURE ENLIVE / ENSURE PLUS) liquid 237 mL  237 mL Oral BID BM Estella Husk, MD   237 mL at 11/17/20 1424   FLUoxetine (PROZAC) capsule 20 mg  20 mg Oral Daily Estella Husk, MD   20 mg at 11/18/20 0930   hydrOXYzine (ATARAX/VISTARIL) tablet 25 mg  25 mg Oral TID PRN Estella Husk, MD       hydrOXYzine (ATARAX/VISTARIL) tablet 50 mg  50 mg Oral QHS PRN Estella Husk, MD   50 mg at 11/17/20 2110   magnesium hydroxide (MILK OF MAGNESIA) suspension 30 mL  30 mL Oral Daily PRN [name] A, NP       nicotine (NICODERM CQ - dosed in mg/24 hours) patch 21 mg  21 mg Transdermal Daily [name] A, NP   21 mg at 11/18/20 0932   OLANZapine zydis (ZYPREXA) disintegrating tablet 10 mg  10 mg Oral QHS [name] A, NP   10 mg at 11/18/20 2125   traZODone (DESYREL) tablet 50 mg  50 mg Oral QHS Bobbitt, Shalon E, NP   50 mg at 11/18/20 2121   Current Outpatient Medications  Medication Sig Dispense Refill   albuterol (VENTOLIN HFA) 108 (90 Base) MCG/ACT inhaler Inhale 1-2 puffs into the lungs every 4 (four) hours as needed for wheezing or shortness of breath. 1 each 1   FLUoxetine (PROZAC) 20 MG capsule Take 1 capsule (20 mg total) by mouth daily. 30 capsule 1   hydrOXYzine (ATARAX/VISTARIL) 25 MG tablet Take 1 tablet (25 mg total) by mouth 3 (three) times daily as needed for anxiety (sleep). 30 tablet 1   nicotine  (NICODERM CQ - DOSED IN MG/24 HOURS) 21 mg/24hr patch Place 1 patch (21 mg total) onto the skin daily. 28 patch 1   OLANZapine zydis (ZYPREXA) 10 MG disintegrating tablet Take 1 tablet (10 mg total) by mouth at bedtime. 30 tablet 1    PTA Medications: (Not in a hospital admission)   Musculoskeletal  Strength & Muscle Tone: within normal limits Gait & Station: normal Patient leans: N/A  Psychiatric Specialty Exam  Presentation  General Appearance: Appropriate for Environment  Eye  Contact:Fair  Speech:Clear and Coherent  Speech Volume:Normal  Handedness:Right   Mood and Affect  Mood:Euthymic  Affect:Congruent   Thought Process  Thought Processes:Coherent; Goal Directed  Descriptions of Associations:Intact  Orientation:Full (Time, Place and Person)  Thought Content:Logical  Diagnosis of Schizophrenia or Schizoaffective disorder in past: No    Hallucinations:Hallucinations: None  Ideas of Reference:None  Suicidal Thoughts:Suicidal Thoughts: No  Homicidal Thoughts:Homicidal Thoughts: No   Sensorium  Memory:Immediate Fair; Recent Fair; Remote Fair  Judgment:Fair  Insight:Fair   Executive Functions  Concentration:Fair  Attention Span:Fair  Recall:Fair  Fund of Knowledge:Fair  Language:Fair   Psychomotor Activity  Psychomotor Activity:Psychomotor Activity: Normal   Assets  Assets:Communication Skills; Desire for Improvement; Physical Health   Sleep  Sleep:Sleep: Good   No data recorded  Physical Exam  See SRA for physical exam and ROS  Blood pressure 107/64, pulse 72, temperature 98.7 F (37.1 C), temperature source Oral, resp. rate 16, height 5\' 10"  (1.778 m), weight 74.8 kg, SpO2 99 %. Body mass index is 23.66 kg/m.  See SRA for suicide risk assessment  Plan Of Care/Follow-up recommendations:  Activity:  as tolerated Diet:  regular Other:    Patient is instructed prior to discharge to: Take all medications as prescribed by  his/her mental healthcare provider. Report any adverse effects and or reactions from the medicines to his/her outpatient provider promptly. Patient has been instructed & cautioned: To not engage in alcohol and or illegal drug use while on prescription medicines. In the event of worsening symptoms, patient is instructed to call the crisis hotline, 911 and or go to the nearest ED for appropriate evaluation and treatment of symptoms. To follow-up with his/her primary care provider for your other medical issues, concerns and or health care needs.    Patient discharged to [facility] with 14 day samples and scripts for medication    TAKE these medications    albuterol 108 (90 Base) MCG/ACT inhaler Commonly known as: VENTOLIN HFA Inhale 1-2 puffs into the lungs every 4 (four) hours as needed for wheezing or shortness of breath.   FLUoxetine 20 MG capsule Commonly known as: PROZAC Take 1 capsule (20 mg total) by mouth daily.   hydrOXYzine 25 MG tablet Commonly known as: ATARAX/VISTARIL Take 1 tablet (25 mg total) by mouth 3 (three) times daily as needed for anxiety (sleep).   nicotine 21 mg/24hr patch Commonly known as: NICODERM CQ - dosed in mg/24 hours Place 1 patch (21 mg total) onto the skin daily.   OLANZapine zydis 10 MG disintegrating tablet Commonly known as: ZYPREXA Take 1 tablet (10 mg total) by mouth at bedtime. What changed:  medication strength how much to take   traZODone 50 MG tablet Commonly known as: DESYREL Take 1 tablet (50 mg total) by mouth at bedtime.         Disposition: [facility] via safe transport  [facility], MD 11/19/2020, 8:12 AM

## 2020-11-19 NOTE — ED Notes (Signed)
Pt in bed sleeping, respirations even/unlabored, environment check complete/secure, will continue to monitor patient for safety. 

## 2021-01-12 ENCOUNTER — Encounter (HOSPITAL_COMMUNITY): Payer: Self-pay | Admitting: Emergency Medicine

## 2021-01-12 ENCOUNTER — Other Ambulatory Visit: Payer: Self-pay

## 2021-01-12 ENCOUNTER — Emergency Department (HOSPITAL_COMMUNITY)
Admission: EM | Admit: 2021-01-12 | Discharge: 2021-01-13 | Disposition: A | Discharge status: Other Acute Inpatient Hospital | Payer: Self-pay | Attending: Emergency Medicine | Admitting: Emergency Medicine

## 2021-01-12 DIAGNOSIS — F314 Bipolar disorder, current episode depressed, severe, without psychotic features: Secondary | ICD-10-CM | POA: Insufficient documentation

## 2021-01-12 DIAGNOSIS — R45851 Suicidal ideations: Secondary | ICD-10-CM

## 2021-01-12 DIAGNOSIS — S50812A Abrasion of left forearm, initial encounter: Secondary | ICD-10-CM | POA: Insufficient documentation

## 2021-01-12 DIAGNOSIS — F10920 Alcohol use, unspecified with intoxication, uncomplicated: Secondary | ICD-10-CM

## 2021-01-12 DIAGNOSIS — Y92480 Sidewalk as the place of occurrence of the external cause: Secondary | ICD-10-CM | POA: Insufficient documentation

## 2021-01-12 DIAGNOSIS — J45909 Unspecified asthma, uncomplicated: Secondary | ICD-10-CM | POA: Insufficient documentation

## 2021-01-12 DIAGNOSIS — Z79899 Other long term (current) drug therapy: Secondary | ICD-10-CM | POA: Insufficient documentation

## 2021-01-12 DIAGNOSIS — F10120 Alcohol abuse with intoxication, uncomplicated: Secondary | ICD-10-CM | POA: Insufficient documentation

## 2021-01-12 DIAGNOSIS — Y908 Blood alcohol level of 240 mg/100 ml or more: Secondary | ICD-10-CM | POA: Insufficient documentation

## 2021-01-12 DIAGNOSIS — Z20822 Contact with and (suspected) exposure to covid-19: Secondary | ICD-10-CM | POA: Insufficient documentation

## 2021-01-12 DIAGNOSIS — R471 Dysarthria and anarthria: Secondary | ICD-10-CM | POA: Insufficient documentation

## 2021-01-12 DIAGNOSIS — W01198A Fall on same level from slipping, tripping and stumbling with subsequent striking against other object, initial encounter: Secondary | ICD-10-CM | POA: Insufficient documentation

## 2021-01-12 DIAGNOSIS — F17210 Nicotine dependence, cigarettes, uncomplicated: Secondary | ICD-10-CM | POA: Insufficient documentation

## 2021-01-12 LAB — RAPID URINE DRUG SCREEN, HOSP PERFORMED
Amphetamines: NOT DETECTED
Barbiturates: NOT DETECTED
Benzodiazepines: NOT DETECTED
Cocaine: NOT DETECTED
Opiates: NOT DETECTED
Tetrahydrocannabinol: NOT DETECTED

## 2021-01-12 LAB — CBC
HCT: 43.6 % (ref 39.0–52.0)
Hemoglobin: 15.3 g/dL (ref 13.0–17.0)
MCH: 31.5 pg (ref 26.0–34.0)
MCHC: 35.1 g/dL (ref 30.0–36.0)
MCV: 89.7 fL (ref 80.0–100.0)
Platelets: 271 10*3/uL (ref 150–400)
RBC: 4.86 MIL/uL (ref 4.22–5.81)
RDW: 13.2 % (ref 11.5–15.5)
WBC: 9.3 10*3/uL (ref 4.0–10.5)
nRBC: 0 % (ref 0.0–0.2)

## 2021-01-12 LAB — COMPREHENSIVE METABOLIC PANEL
ALT: 22 U/L (ref 0–44)
AST: 29 U/L (ref 15–41)
Albumin: 4.1 g/dL (ref 3.5–5.0)
Alkaline Phosphatase: 65 U/L (ref 38–126)
Anion gap: 9 (ref 5–15)
BUN: 6 mg/dL (ref 6–20)
CO2: 23 mmol/L (ref 22–32)
Calcium: 8.3 mg/dL — ABNORMAL LOW (ref 8.9–10.3)
Chloride: 107 mmol/L (ref 98–111)
Creatinine, Ser: 0.62 mg/dL (ref 0.61–1.24)
GFR, Estimated: >60 mL/min (ref >60)
Glucose, Bld: 107 mg/dL — ABNORMAL HIGH (ref 70–99)
Potassium: 3.1 mmol/L — ABNORMAL LOW (ref 3.5–5.1)
Sodium: 139 mmol/L (ref 135–145)
Total Bilirubin: 0.6 mg/dL (ref 0.3–1.2)
Total Protein: 6.5 g/dL (ref 6.5–8.1)

## 2021-01-12 LAB — SALICYLATE LEVEL: Salicylate Lvl: <7 mg/dL — ABNORMAL LOW (ref 7.0–30.0)

## 2021-01-12 LAB — RESP PANEL BY RT-PCR (FLU A&B, COVID) ARPGX2
Influenza A by PCR: NEGATIVE
Influenza B by PCR: NEGATIVE
SARS Coronavirus 2 by RT PCR: NEGATIVE

## 2021-01-12 LAB — ETHANOL: Alcohol, Ethyl (B): 325 mg/dL (ref <10)

## 2021-01-12 LAB — ACETAMINOPHEN LEVEL: Acetaminophen (Tylenol), Serum: <10 ug/mL — ABNORMAL LOW (ref 10–30)

## 2021-01-12 NOTE — ED Provider Notes (Signed)
[facility] Note   CSN: [id number] Arrival date & time: 01/12/21  1438     History Chief Complaint  Patient presents with   Drug Overdose   Suicide Attempt    Brett House is a 35 y.o. male.  HPI Patient here for evaluation of substance abuse and thoughts of suicide.  He was transferred by EMS after he was found lying on the sidewalk, and reported a fall.  No head injury was seen but he had an abrasion on his left forearm.  He told EMS that he took fentanyl and heroin, and was trying to overdose to commit suicide.  Patient reports to me now that he is tired of being "a drug addict" and no longer wants to commit suicide.  He states that he has been a drug addict for 20 years, and uses various illegal substances.  He requests " a pill" to help him avoid drugd. He states he has a domicile for living and is not homeless.  He denies other current problems.  Last documented contact with behavioral health services was an admission at the behavioral health urgent care for monitoring for several days.  He was subsequently transferred to [facility] for rehab.  He denies current illnesses including fever, vomiting, chest pain, shortness of breath, weakness or dizziness.    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 No pertinent surgical history.     History reviewed. No  pertinent family history.  Social History   Tobacco Use   Smoking status: Every Day    Packs/day: 1.50    Years: 15.00    Pack years: 22.50    Types: Cigarettes   Smokeless tobacco: Current  Vaping Use   Vaping Use: Never used  Substance Use Topics   Alcohol use: Yes    Comment: 6 pack once a week for last 8-9 months   Drug use: Yes    Types: Marijuana    Comment: rarely    Home Medications Prior to Admission medications   Medication Sig Start Date End Date Taking? Authorizing Provider  FLUoxetine (PROZAC) 20 MG capsule Take 1 capsule (20 mg total) by mouth daily. 11/15/20 01/14/21 Yes Estella Husk, MD  hydrOXYzine (ATARAX/VISTARIL) 25 MG tablet Take 1 tablet (25 mg total) by mouth 3 (three) times daily as needed for anxiety (sleep). 11/15/20  Yes Estella Husk, MD  OLANZapine (ZYPREXA) 10 MG tablet Take 10 mg by mouth at bedtime. 12/27/20  Yes [provider]  OLANZapine zydis (ZYPREXA) 10 MG disintegrating tablet Take 1 tablet (10 mg total) by mouth at bedtime. 11/15/20  Yes Estella Husk, MD  albuterol (VENTOLIN HFA) 108 (90 Base) MCG/ACT inhaler Inhale 1-2 puffs into the lungs every 4 (four) hours as needed for wheezing or shortness of breath. 11/19/20 12/19/20  Estella Husk, MD  nicotine (  NICODERM CQ - DOSED IN MG/24 HOURS) 21 mg/24hr patch Place 1 patch (21 mg total) onto the skin daily. Patient not taking: Reported on 01/12/2021 11/15/20   Estella Husk, MD  traZODone (DESYREL) 50 MG tablet Take 1 tablet (50 mg total) by mouth at bedtime. 11/19/20 12/19/20  Estella Husk, MD    Allergies    Erythromycin  Review of Systems   Review of Systems  All other systems reviewed and are negative.  Physical Exam Updated Vital Signs BP 118/81 (BP Location: Right Arm)   Pulse 89   Temp 98 F (36.7 C) (Oral)   Resp 14   SpO2 97%   Physical Exam Vitals and nursing note reviewed.  Constitutional:      Appearance: He is  well-developed. He is not ill-appearing.  HENT:     Head: Normocephalic and atraumatic.     Right Ear: External ear normal.     Left Ear: External ear normal.  Eyes:     Conjunctiva/sclera: Conjunctivae normal.     Pupils: Pupils are equal, round, and reactive to light.  Neck:     Trachea: Phonation normal.  Cardiovascular:     Rate and Rhythm: Normal rate.  Pulmonary:     Effort: Pulmonary effort is normal.  Abdominal:     General: There is no distension.  Musculoskeletal:        General: Normal range of motion.     Cervical back: Normal range of motion and neck supple.  Skin:    General: Skin is warm and dry.  Neurological:     Mental Status: He is alert and oriented to person, place, and time.     Cranial Nerves: No cranial nerve deficit.     Sensory: No sensory deficit.     Motor: No abnormal muscle tone.     Coordination: Coordination normal.     Comments: Dysarthria without aphasia.  No ataxia.  Psychiatric:        Attention and Perception: He is attentive.        Mood and Affect: Mood normal. Mood is not anxious or depressed. Affect is not labile, flat or inappropriate.        Speech: He is communicative.        Behavior: Behavior is not agitated, aggressive or hyperactive.        Cognition and Memory: Cognition normal.        Judgment: Judgment is inappropriate.     Comments: Patient frequently cursing and using inappropriate terms to address examiner.    ED Results / Procedures / Treatments   Labs (all labs ordered are listed, but only abnormal results are displayed) Labs Reviewed  COMPREHENSIVE METABOLIC PANEL - Abnormal; Notable for the following components:      Result Value   Potassium 3.1 (*)    Glucose, Bld 107 (*)    Calcium 8.3 (*)    All other components within normal limits  ETHANOL - Abnormal; Notable for the following components:   Alcohol, Ethyl (B) 325 (*)    All other components within normal limits  SALICYLATE LEVEL - Abnormal; Notable for  the following components:   Salicylate Lvl <7.0 (*)    All other components within normal limits  ACETAMINOPHEN LEVEL - Abnormal; Notable for the following components:   Acetaminophen (Tylenol), Serum <10 (*)    All other components within normal limits  RESP PANEL BY RT-PCR (FLU A&B, COVID) ARPGX2  CBC  RAPID URINE DRUG SCREEN, HOSP PERFORMED  EKG None  Radiology No results found.  Procedures Procedures   Medications Ordered in ED Medications - No data to display  ED Course  I have reviewed the triage vital signs and the nursing notes.  Pertinent labs & imaging results that were available during my care of the patient were reviewed by me and considered in my medical decision making (see chart for details).    MDM Rules/Calculators/A&P                            Patient Vitals for the past 24 hrs:  BP Temp Temp src Pulse Resp SpO2  01/12/21 1918 118/81 -- -- 89 14 97 %  01/12/21 1815 123/85 -- -- 97 16 99 %  01/12/21 1715 109/76 -- -- 98 13 97 %  01/12/21 1701 107/60 -- -- 99 18 95 %  01/12/21 1700 -- -- -- 99 18 94 %  01/12/21 1510 134/67 -- -- 88 18 94 %  01/12/21 1455 122/83 98 F (36.7 C) Oral 95 13 100 %    7:45 PM Reevaluation with update and discussion. After initial assessment and treatment, an updated evaluation reveals he remains comfortable lying in bed. Mancel Bale   Medical Decision Making:  This patient is presenting for evaluation of substance abuse and transient thoughts of suicide, which does require a range of treatment options, and is a complaint that involves a moderate risk of morbidity and mortality. The differential diagnoses include substance abuse, social stressors, depression, suicidal ideation. I decided to review old records, and in summary patient with polysubstance abuse presenting with SI intoxicated and restless.  I did not require additional historical information from anyone.  Clinical Laboratory Tests Ordered, included CBC,  Metabolic panel, and UDS, viral panel, Tylenol level, aspirin level, alcohol, viral panel . Review indicates normal except alcohol high, potassium low.   Critical Interventions-clinical evaluation, laboratory testing, observation, TTS consultation  After These Interventions, the Patient was reevaluated and was found with likely substance abuse, and history of bipolar disorder.  He recently had rehab at an inpatient psychiatric facility.  He asked me for a pill to help him stop abusing substances.  Since I did not have that however he did not want to stay for further evaluation.  7:45 PM-patient has ultimately agreed to stay and await evaluation by TTS.  He is medically cleared for treatment by psychiatry.  He is voluntary.   CRITICAL CARE-no Performed by: Mancel Bale  Nursing Notes Reviewed/ Care Coordinated Applicable Imaging Reviewed Interpretation of Laboratory Data incorporated into ED treatment   Plan-disposition by TTS in conjunction with oncoming provider team    Final Clinical Impression(s) / ED Diagnoses Final diagnoses:  Suicidal ideation  Alcoholic intoxication without complication [facility])    Rx / DC Orders ED Discharge Orders     None        Mancel Bale, MD 01/12/21 1948

## 2021-01-12 NOTE — ED Triage Notes (Signed)
EMS was called by by standers because the patient was found laying on his back on the sidewalk. When EMS arrived he was alert and oriented. He suffered a fall in which he hit his head. He denies pain, but EMS did note road rash to the left forearm.He reported feeling high due to taking fentanyl and heroin at the same time and smoking marijuana. He also reports to EMS, he was trying to overdose in order to commit suicide.   EMS vitals: 158/84 BP 100 HR 98% SPO2 18 RR 86 CBG

## 2021-01-13 ENCOUNTER — Other Ambulatory Visit (HOSPITAL_COMMUNITY)
Admission: EM | Admit: 2021-01-13 | Discharge: 2021-01-16 | Disposition: A | Discharge status: Home / Self Care | Payer: No Payment, Other | Attending: Nurse Practitioner | Admitting: Nurse Practitioner

## 2021-01-13 DIAGNOSIS — R45851 Suicidal ideations: Secondary | ICD-10-CM | POA: Insufficient documentation

## 2021-01-13 DIAGNOSIS — F17210 Nicotine dependence, cigarettes, uncomplicated: Secondary | ICD-10-CM | POA: Insufficient documentation

## 2021-01-13 DIAGNOSIS — F419 Anxiety disorder, unspecified: Secondary | ICD-10-CM | POA: Insufficient documentation

## 2021-01-13 DIAGNOSIS — F314 Bipolar disorder, current episode depressed, severe, without psychotic features: Secondary | ICD-10-CM | POA: Diagnosis not present

## 2021-01-13 DIAGNOSIS — F1020 Alcohol dependence, uncomplicated: Secondary | ICD-10-CM

## 2021-01-13 DIAGNOSIS — F1290 Cannabis use, unspecified, uncomplicated: Secondary | ICD-10-CM | POA: Diagnosis not present

## 2021-01-13 DIAGNOSIS — F319 Bipolar disorder, unspecified: Secondary | ICD-10-CM

## 2021-01-13 DIAGNOSIS — F10239 Alcohol dependence with withdrawal, unspecified: Secondary | ICD-10-CM | POA: Insufficient documentation

## 2021-01-13 DIAGNOSIS — Z560 Unemployment, unspecified: Secondary | ICD-10-CM | POA: Insufficient documentation

## 2021-01-13 DIAGNOSIS — T1491XA Suicide attempt, initial encounter: Secondary | ICD-10-CM

## 2021-01-13 DIAGNOSIS — Z9151 Personal history of suicidal behavior: Secondary | ICD-10-CM | POA: Insufficient documentation

## 2021-01-13 LAB — LIPID PANEL
Cholesterol: 198 mg/dL (ref 0–200)
HDL: 76 mg/dL (ref >40)
LDL Cholesterol: 66 mg/dL (ref 0–99)
Total CHOL/HDL Ratio: 2.6 RATIO
Triglycerides: 281 mg/dL — ABNORMAL HIGH (ref <150)
VLDL: 56 mg/dL — ABNORMAL HIGH (ref 0–40)

## 2021-01-13 LAB — TSH: TSH: 1.069 u[IU]/mL (ref 0.350–4.500)

## 2021-01-13 LAB — MAGNESIUM: Magnesium: 2.4 mg/dL (ref 1.7–2.4)

## 2021-01-13 MED ORDER — LORAZEPAM 1 MG PO TABS
1.0000 mg | ORAL_TABLET | Freq: Two times a day (BID) | ORAL | Status: AC
  Administered 2021-01-15 – 2021-01-16 (×2): 1 mg via ORAL
  Filled 2021-01-13 (×2): qty 1

## 2021-01-13 MED ORDER — HYDROXYZINE HCL 25 MG PO TABS: 25.0000 mg | ORAL_TABLET | Freq: Four times a day (QID) | ORAL | Status: AC | PRN

## 2021-01-13 MED ORDER — TRAZODONE HCL 50 MG PO TABS
50.0000 mg | ORAL_TABLET | Freq: Every evening | ORAL | Status: DC | PRN
  Administered 2021-01-13 – 2021-01-15 (×2): 50 mg via ORAL
  Filled 2021-01-13 (×2): qty 1
  Filled 2021-01-13: qty 14

## 2021-01-13 MED ORDER — THIAMINE HCL 100 MG PO TABS
100.0000 mg | ORAL_TABLET | Freq: Every day | ORAL | Status: DC
  Administered 2021-01-14 – 2021-01-16 (×3): 100 mg via ORAL
  Filled 2021-01-13 (×2): qty 1
  Filled 2021-01-13: qty 14
  Filled 2021-01-13: qty 1

## 2021-01-13 MED ORDER — LOPERAMIDE HCL 2 MG PO CAPS
2.0000 mg | ORAL_CAPSULE | ORAL | Status: AC | PRN
Start: 2021-01-13 — End: 2021-01-16
  Filled 2021-01-13: qty 1

## 2021-01-13 MED ORDER — ACETAMINOPHEN 325 MG PO TABS: 650.0000 mg | ORAL_TABLET | Freq: Four times a day (QID) | ORAL | Status: DC | PRN

## 2021-01-13 MED ORDER — FOLIC ACID 1 MG PO TABS
1.0000 mg | ORAL_TABLET | Freq: Every day | ORAL | Status: DC
  Administered 2021-01-15 – 2021-01-16 (×2): 1 mg via ORAL
  Filled 2021-01-13 (×2): qty 1

## 2021-01-13 MED ORDER — LORAZEPAM 1 MG PO TABS
1.0000 mg | ORAL_TABLET | Freq: Three times a day (TID) | ORAL | Status: AC
  Administered 2021-01-14 – 2021-01-15 (×3): 1 mg via ORAL
  Filled 2021-01-13 (×3): qty 1

## 2021-01-13 MED ORDER — POTASSIUM CHLORIDE CRYS ER 20 MEQ PO TBCR
20.0000 meq | EXTENDED_RELEASE_TABLET | Freq: Every day | ORAL | Status: DC
  Administered 2021-01-13 – 2021-01-16 (×4): 20 meq via ORAL
  Filled 2021-01-13 (×4): qty 1

## 2021-01-13 MED ORDER — ALUM & MAG HYDROXIDE-SIMETH 200-200-20 MG/5ML PO SUSP: 30.0000 mL | ORAL | Status: DC | PRN

## 2021-01-13 MED ORDER — NICOTINE 21 MG/24HR TD PT24
21.0000 mg | MEDICATED_PATCH | Freq: Every day | TRANSDERMAL | Status: DC
  Administered 2021-01-13 – 2021-01-16 (×4): 21 mg via TRANSDERMAL
  Filled 2021-01-13 (×4): qty 1
  Filled 2021-01-13: qty 14

## 2021-01-13 MED ORDER — LORAZEPAM 1 MG PO TABS: 1.0000 mg | ORAL_TABLET | Freq: Every day | ORAL | Status: DC

## 2021-01-13 MED ORDER — LORAZEPAM 1 MG PO TABS
1.0000 mg | ORAL_TABLET | Freq: Four times a day (QID) | ORAL | Status: AC | PRN
  Administered 2021-01-13: 1 mg via ORAL

## 2021-01-13 MED ORDER — ONDANSETRON 4 MG PO TBDP
4.0000 mg | ORAL_TABLET | Freq: Four times a day (QID) | ORAL | Status: AC | PRN
  Administered 2021-01-13: 4 mg via ORAL
  Filled 2021-01-13 (×2): qty 1

## 2021-01-13 MED ORDER — MAGNESIUM HYDROXIDE 400 MG/5ML PO SUSP: 30.0000 mL | Freq: Every day | ORAL | Status: DC | PRN

## 2021-01-13 MED ORDER — ADULT MULTIVITAMIN W/MINERALS CH
1.0000 | ORAL_TABLET | Freq: Every day | ORAL | Status: DC
  Administered 2021-01-13 – 2021-01-16 (×4): 1 via ORAL
  Filled 2021-01-13 (×3): qty 1
  Filled 2021-01-13: qty 14
  Filled 2021-01-13: qty 1

## 2021-01-13 MED ORDER — LORAZEPAM 1 MG PO TABS
1.0000 mg | ORAL_TABLET | Freq: Four times a day (QID) | ORAL | Status: AC
  Administered 2021-01-13 – 2021-01-14 (×5): 1 mg via ORAL
  Filled 2021-01-13 (×6): qty 1

## 2021-01-13 MED ORDER — OLANZAPINE 10 MG PO TABS
10.0000 mg | ORAL_TABLET | Freq: Every day | ORAL | Status: DC
  Administered 2021-01-13 – 2021-01-15 (×3): 10 mg via ORAL
  Filled 2021-01-13: qty 1
  Filled 2021-01-13: qty 14
  Filled 2021-01-13 (×2): qty 1

## 2021-01-13 MED ORDER — ALBUTEROL SULFATE HFA 108 (90 BASE) MCG/ACT IN AERS
1.0000 | INHALATION_SPRAY | RESPIRATORY_TRACT | Status: DC | PRN
  Filled 2021-01-13: qty 8

## 2021-01-13 MED ORDER — FLUOXETINE HCL 20 MG PO CAPS
20.0000 mg | ORAL_CAPSULE | Freq: Every day | ORAL | Status: DC
  Administered 2021-01-13 – 2021-01-15 (×3): 20 mg via ORAL
  Filled 2021-01-13 (×3): qty 1

## 2021-01-13 NOTE — ED Provider Notes (Signed)
Behavioral Health Progress Note  Date and Time: 01/13/2021 11:28 AM Name: Brett House MRN:  [id number]  Subjective:  Pt is a 35 year old single male who presents unaccompanied to [facility] ED after being found unconscious lying on a sidewalk. Pt reports he drank two 40-ounce beers and intentionally ingested heroin and Fentanyl in a suicide attempt. Pt reports feeling increasingly depressed for the past week.  Patient seen and reevaluated face-to-face by this provider, chart reviewed. On evaluation, patient is alert and oriented x4. His thought process is logical and speech is coherent. His mood is dysphoric and affect is congruent.   He reports that he has been feeling pretty depressed for the past week and describes his symptoms as sadness, hopelessness, embarrassment, difficulty sleeping, and poor appetite. He states that he is embarrassed that he relapsed. He reports drinking on average 12 cans of beer for the past month. He describes his withdrawal symptoms as nausea, headache, diarrhea, tremors, chillsand sweats. He reports a history of alcohol withdrawal seizures in 2017. He reports a poor appetite. He reports poor sleep. He denies AVH. He does not appear objectively to be responding to internal or external stimuli. He states that he is prescribed Zyprexa 10 mg at bedtime, Vistaril 25 mg 3 times a day as needed, Prozac 20 mg daily and trazodone 50 mg at bedtime for sleep. He states that he has been off his medications for the past 2 weeks. He states that he follows up with [facility] for medication management.   Diagnosis:  Final diagnoses:  Bipolar I disorder (HCC)  Suicide attempt (HCC)  Alcohol use disorder, severe, dependence (HCC)    Total Time spent with patient: 20 minutes  Past Psychiatric History: Patient last admitted to the [facility] on 11/14/20. Patient was last inpatient at [facility] 10/12/2020-10/16/2020. He was discharged on depakote, doxepin, olanzapine, and tiagabine.   Past  Medical History:  Past Medical History:  Diagnosis Date   Anxiety    Asthma    Bipolar disorder (HCC)    Depression    No past surgical history on file. Family History: No family history on file. Family Psychiatric  History: He reports an uncle on his mother's side and multiple uncles on his father's side being hospitalized for undisclosed mental health issues. He reports a cousin on his father's Social History:  Social History   Substance and Sexual Activity  Alcohol Use Yes   Comment: 6 pack once a week for last 8-9 months     Social History   Substance and Sexual Activity  Drug Use Yes   Types: Marijuana   Comment: rarely    Social History   Socioeconomic History   Marital status: Single    Spouse name: Not on file   Number of children: 0   Years of education: Not on file   Highest education level: 12th grade  Occupational History   Occupation: Unemployed  Tobacco Use   Smoking status: Every Day    Packs/day: 1.50    Years: 15.00    Pack years: 22.50    Types: Cigarettes   Smokeless tobacco: Current  Vaping Use   Vaping Use: Never used  Substance and Sexual Activity   Alcohol use: Yes    Comment: 6 pack once a week for last 8-9 months   Drug use: Yes    Types: Marijuana    Comment: rarely   Sexual activity: Yes  Other Topics Concern   Not on file  Social History Narrative  Not on file   Social Determinants of Health   Financial Resource Strain: Not on file  Food Insecurity: Not on file  Transportation Needs: Not on file  Physical Activity: Not on file  Stress: Not on file  Social Connections: Not on file   SDOH:  SDOH Screenings   Alcohol Screen: Low Risk    Last Alcohol Screening Score (AUDIT): 0  Depression (PHQ2-9): Not on file  Financial Resource Strain: Not on file  Food Insecurity: Not on file  Housing: Not on file  Physical Activity: Not on file  Social Connections: Not on file  Stress: Not on file  Tobacco Use: High Risk    Smoking Tobacco Use: Every Day   Smokeless Tobacco Use: Current   Passive Exposure: Not on file  Transportation Needs: Not on file    Current Medications:  Current Facility-Administered Medications  Medication Dose Route Frequency Provider Last Rate Last Admin   acetaminophen (TYLENOL) tablet 650 mg  650 mg Oral Q6H PRN Bobbitt, Shalon E, NP       albuterol (VENTOLIN HFA) 108 (90 Base) MCG/ACT inhaler 1-2 puff  1-2 puff Inhalation Q4H PRN Jahlil Ziller L, NP       alum & mag hydroxide-simeth (MAALOX/MYLANTA) 200-200-20 MG/5ML suspension 30 mL  30 mL Oral Q4H PRN Bobbitt, Shalon E, NP       FLUoxetine (PROZAC) capsule 20 mg  20 mg Oral Daily Gavriella Hearst L, NP       folic acid (FOLVITE) tablet 1 mg  1 mg Oral Daily Bobbitt, Shalon E, NP       hydrOXYzine (ATARAX/VISTARIL) tablet 25 mg  25 mg Oral Q6H PRN Bobbitt, Shalon E, NP       loperamide (IMODIUM) capsule 2-4 mg  2-4 mg Oral PRN Bobbitt, Shalon E, NP       LORazepam (ATIVAN) tablet 1 mg  1 mg Oral Q6H PRN Bobbitt, Shalon E, NP   1 mg at 01/13/21 0940   LORazepam (ATIVAN) tablet 1 mg  1 mg Oral QID Bobbitt, Shalon E, NP       Followed by   Melene Muller ON 01/14/2021] LORazepam (ATIVAN) tablet 1 mg  1 mg Oral TID Bobbitt, Shalon E, NP       Followed by   Melene Muller ON 01/15/2021] LORazepam (ATIVAN) tablet 1 mg  1 mg Oral BID Bobbitt, Shalon E, NP       Followed by   Melene Muller ON 01/17/2021] LORazepam (ATIVAN) tablet 1 mg  1 mg Oral Daily Bobbitt, Shalon E, NP       magnesium hydroxide (MILK OF MAGNESIA) suspension 30 mL  30 mL Oral Daily PRN Bobbitt, Shalon E, NP       multivitamin with minerals tablet 1 tablet  1 tablet Oral Daily Bobbitt, Shalon E, NP   1 tablet at 01/13/21 0941   nicotine (NICODERM CQ - dosed in mg/24 hours) patch 21 mg  21 mg Transdermal Q0600 Bobbitt, Shalon E, NP   21 mg at 01/13/21 0615   OLANZapine (ZYPREXA) tablet 10 mg  10 mg Oral QHS Ammar Moffatt L, NP       ondansetron (ZOFRAN-ODT) disintegrating tablet 4 mg  4 mg  Oral Q6H PRN Bobbitt, Shalon E, NP   4 mg at 01/13/21 0332   potassium chloride SA (KLOR-CON) CR tablet 20 mEq  20 mEq Oral Daily Bobbitt, Shalon E, NP   20 mEq at 01/13/21 0941   [START ON 01/14/2021] thiamine tablet 100 mg  100 mg  Oral Daily Bobbitt, Shalon E, NP       traZODone (DESYREL) tablet 50 mg  50 mg Oral QHS PRN Bobbitt, Shalon E, NP       Current Outpatient Medications  Medication Sig Dispense Refill   albuterol (VENTOLIN HFA) 108 (90 Base) MCG/ACT inhaler Inhale 1-2 puffs into the lungs every 4 (four) hours as needed for wheezing or shortness of breath. 1 each 1   FLUoxetine (PROZAC) 20 MG capsule Take 1 capsule (20 mg total) by mouth daily. 30 capsule 1   hydrOXYzine (ATARAX/VISTARIL) 25 MG tablet Take 1 tablet (25 mg total) by mouth 3 (three) times daily as needed for anxiety (sleep). 30 tablet 1   nicotine (NICODERM CQ - DOSED IN MG/24 HOURS) 21 mg/24hr patch Place 1 patch (21 mg total) onto the skin daily. (Patient not taking: Reported on 01/12/2021) 28 patch 1   OLANZapine (ZYPREXA) 10 MG tablet Take 10 mg by mouth at bedtime.     OLANZapine zydis (ZYPREXA) 10 MG disintegrating tablet Take 1 tablet (10 mg total) by mouth at bedtime. 30 tablet 1   traZODone (DESYREL) 50 MG tablet Take 1 tablet (50 mg total) by mouth at bedtime. 30 tablet 1    Labs  Lab Results:  Admission on 01/13/2021  Component Date Value Ref Range Status   Magnesium 01/12/2021 2.4  1.7 - 2.4 mg/dL Final   Performed at [facility], [address] [place], [place] [address]   Cholesterol 01/12/2021 198  0 - 200 mg/dL Final   Triglycerides 78/24/2353 281 (A)  <150 mg/dL Final   HDL 61/44/3154 76  >40 mg/dL Final   Total CHOL/HDL Ratio 01/12/2021 2.6  RATIO Final   VLDL 01/12/2021 56 (A)  0 - 40 mg/dL Final   LDL Cholesterol 01/12/2021 66  0 - 99 mg/dL Final   Comment:        Total Cholesterol/HDL:CHD Risk Coronary Heart Disease Risk Table                     Men   Women  1/2  Average Risk   3.4   3.3  Average Risk       5.0   4.4  2 X Average Risk   9.6   7.1  3 X Average Risk  23.4   11.0        Use the calculated Patient Ratio above and the CHD Risk Table to determine the patient's CHD Risk.        ATP III CLASSIFICATION (LDL):  <100     mg/dL   Optimal  008-676  mg/dL   Near or Above                    Optimal  130-159  mg/dL   Borderline  195-093  mg/dL   High  >267     mg/dL   Very High Performed at [facility], [address], [place], [place] [address]    TSH 01/12/2021 1.069  0.350 - 4.500 uIU/mL Final   Comment: Performed by a 3rd Generation assay with a functional sensitivity of <=0.01 uIU/mL. Performed at [facility], [address] [place], [place] [address]   Admission on 01/12/2021, Discharged on 01/13/2021  Component Date Value Ref Range Status   Sodium 01/12/2021 139  135 - 145 mmol/L Final   Potassium 01/12/2021 3.1 (A)  3.5 - 5.1 mmol/L Final   Chloride 01/12/2021 107  98 - 111 mmol/L Final   CO2 01/12/2021 23  22 - 32 mmol/L Final   Glucose, Bld 01/12/2021 107 (A)  70 - 99 mg/dL Final   Glucose reference range applies only to samples taken after fasting for at least 8 hours.   BUN 01/12/2021 6  6 - 20 mg/dL Final   Creatinine, Ser 01/12/2021 0.62  0.61 - 1.24 mg/dL Final   Calcium 16/12/9602 8.3 (A)  8.9 - 10.3 mg/dL Final   Total Protein 54/11/8117 6.5  6.5 - 8.1 g/dL Final   Albumin 14/78/2956 4.1  3.5 - 5.0 g/dL Final   AST 21/30/8657 29  15 - 41 U/L Final   ALT 01/12/2021 22  0 - 44 U/L Final   Alkaline Phosphatase 01/12/2021 65  38 - 126 U/L Final   Total Bilirubin 01/12/2021 0.6  0.3 - 1.2 mg/dL Final   GFR, Estimated 01/12/2021 >60  >60 mL/min Final   Comment: (NOTE) Calculated using the CKD-EPI Creatinine Equation (2021)    Anion gap 01/12/2021 9  5 - 15 Final   Performed at [facility], [address], [place], [place] [address]   Alcohol, Ethyl (B)  01/12/2021 325 (A)  <10 mg/dL Final   Comment: CRITICAL RESULT CALLED TO, READ BACK BY AND VERIFIED WITH: SONYA, RN @ 1624 ON 01/12/2021 BY LBROOKS, MLT (NOTE) Lowest detectable limit for serum alcohol is 10 mg/dL.  For medical purposes only. Performed at [facility], [address], [place], [place] [address]    Salicylate Lvl 01/12/2021 <7.0 (A)  7.0 - 30.0 mg/dL Final   Performed at [facility], [address], [place], [place] [address]   Acetaminophen (Tylenol), Serum 01/12/2021 <10 (A)  10 - 30 ug/mL Final   Comment: (NOTE) Therapeutic concentrations vary significantly. A range of 10-30 ug/mL  may be an effective concentration for many patients. However, some  are best treated at concentrations outside of this range. Acetaminophen concentrations >150 ug/mL at 4 hours after ingestion  and >50 ug/mL at 12 hours after ingestion are often associated with  toxic reactions.  Performed at [facility], [address] [place], [place] [address]    WBC 01/12/2021 9.3  4.0 - 10.5 K/uL Final   RBC 01/12/2021 4.86  4.22 - 5.81 MIL/uL Final   Hemoglobin 01/12/2021 15.3  13.0 - 17.0 g/dL Final   HCT 72/53/6644 43.6  39.0 - 52.0 % Final   MCV 01/12/2021 89.7  80.0 - 100.0 fL Final   MCH 01/12/2021 31.5  26.0 - 34.0 pg Final   MCHC 01/12/2021 35.1  30.0 - 36.0 g/dL Final   RDW 03/47/4259 13.2  11.5 - 15.5 % Final   Platelets 01/12/2021 271  150 - 400 K/uL Final   nRBC 01/12/2021 0.0  0.0 - 0.2 % Final   Performed at [facility], [address], [place], [place] [address]   Opiates 01/12/2021 NONE DETECTED  NONE DETECTED Final   Cocaine 01/12/2021 NONE DETECTED  NONE DETECTED Final   Benzodiazepines 01/12/2021 NONE DETECTED  NONE DETECTED Final   Amphetamines 01/12/2021 NONE DETECTED  NONE DETECTED Final   Tetrahydrocannabinol 01/12/2021 NONE DETECTED  NONE DETECTED Final   Barbiturates 01/12/2021 NONE  DETECTED  NONE DETECTED Final   Comment: (NOTE) DRUG SCREEN FOR MEDICAL PURPOSES ONLY.  IF CONFIRMATION IS NEEDED FOR ANY PURPOSE, NOTIFY LAB WITHIN 5 DAYS.  LOWEST DETECTABLE LIMITS FOR URINE DRUG SCREEN Drug Class  Cutoff (ng/mL) Amphetamine and metabolites    1000 Barbiturate and metabolites    200 Benzodiazepine                 200 Tricyclics and metabolites     300 Opiates and metabolites        300 Cocaine and metabolites        300 THC                            50 Performed at [facility], [address] [place], [place] [address]    SARS Coronavirus 2 by RT PCR 01/12/2021 NEGATIVE  NEGATIVE Final   Comment: (NOTE) SARS-CoV-2 target nucleic acids are NOT DETECTED.  The SARS-CoV-2 RNA is generally detectable in upper respiratory specimens during the acute phase of infection. The lowest concentration of SARS-CoV-2 viral copies this assay can detect is 138 copies/mL. A negative result does not preclude SARS-Cov-2 infection and should not be used as the sole basis for treatment or other patient management decisions. A negative result may occur with  improper specimen collection/handling, submission of specimen other than nasopharyngeal swab, presence of viral mutation(s) within the areas targeted by this assay, and inadequate number of viral copies(<138 copies/mL). A negative result must be combined with clinical observations, patient history, and epidemiological information. The expected result is Negative.  Fact Sheet for Patients:  BloggerCourse.com  Fact Sheet for Healthcare Providers:  SeriousBroker.it  This test is no                          t yet approved or cleared by the [place] FDA and  has been authorized for detection and/or diagnosis of SARS-CoV-2 by FDA under an Emergency Use Authorization (EUA). This EUA will remain  in effect (meaning this test can be  used) for the duration of the COVID-19 declaration under Section 564(b)(1) of the Act, 21 U.S.C.section 360bbb-3(b)(1), unless the authorization is terminated  or revoked sooner.       Influenza A by PCR 01/12/2021 NEGATIVE  NEGATIVE Final   Influenza B by PCR 01/12/2021 NEGATIVE  NEGATIVE Final   Comment: (NOTE) The Xpert Xpress SARS-CoV-2/FLU/RSV plus assay is intended as an aid in the diagnosis of influenza from Nasopharyngeal swab specimens and should not be used as a sole basis for treatment. Nasal washings and aspirates are unacceptable for Xpert Xpress SARS-CoV-2/FLU/RSV testing.  Fact Sheet for Patients: BloggerCourse.com  Fact Sheet for Healthcare Providers: SeriousBroker.it  This test is not yet approved or cleared by the [place] FDA and has been authorized for detection and/or diagnosis of SARS-CoV-2 by FDA under an Emergency Use Authorization (EUA). This EUA will remain in effect (meaning this test can be used) for the duration of the COVID-19 declaration under Section 564(b)(1) of the Act, 21 U.S.C. section 360bbb-3(b)(1), unless the authorization is terminated or revoked.  Performed at [facility], [address], [place], [place] [address]   Admission on 11/14/2020, Discharged on 11/19/2020  Component Date Value Ref Range Status   RPR Ser Ql 11/14/2020 NON REACTIVE  NON REACTIVE Final   Performed at [facility], [address], [place], [place] [address]   Hepatitis B Surface Ag 11/14/2020 NON REACTIVE  NON REACTIVE Final   HCV Ab 11/14/2020 NON REACTIVE  NON REACTIVE Final   Comment: (NOTE) Nonreactive HCV antibody screen is consistent with no HCV infections,  unless recent infection is suspected or other evidence exists to indicate HCV infection.     Hep A IgM 11/14/2020 NON REACTIVE  NON REACTIVE Final   Hep B C IgM 11/14/2020 NON REACTIVE  NON REACTIVE Final   Performed  at [facility], [address], [place], [place] [address]   HIV Screen 4th Generation wRfx 11/14/2020 Non Reactive  Non Reactive Final   Performed at [facility], [address] [place], [place] [address]  Admission on 11/13/2020, Discharged on 11/14/2020  Component Date Value Ref Range Status   Sodium 11/13/2020 136  135 - 145 mmol/L Final   Potassium 11/13/2020 3.8  3.5 - 5.1 mmol/L Final   Chloride 11/13/2020 101  98 - 111 mmol/L Final   CO2 11/13/2020 24  22 - 32 mmol/L Final   Glucose, Bld 11/13/2020 102 (A)  70 - 99 mg/dL Final   Glucose reference range applies only to samples taken after fasting for at least 8 hours.   BUN 11/13/2020 5 (A)  6 - 20 mg/dL Final   Creatinine, Ser 11/13/2020 0.69  0.61 - 1.24 mg/dL Final   Calcium 29/52/8413 9.3  8.9 - 10.3 mg/dL Final   Total Protein 24/40/1027 7.3  6.5 - 8.1 g/dL Final   Albumin 25/36/6440 4.5  3.5 - 5.0 g/dL Final   AST 34/74/2595 21  15 - 41 U/L Final   ALT 11/13/2020 14  0 - 44 U/L Final   Alkaline Phosphatase 11/13/2020 97  38 - 126 U/L Final   Total Bilirubin 11/13/2020 1.3 (A)  0.3 - 1.2 mg/dL Final   GFR, Estimated 11/13/2020 >60  >60 mL/min Final   Comment: (NOTE) Calculated using the CKD-EPI Creatinine Equation (2021)    Anion gap 11/13/2020 11  5 - 15 Final   Performed at [facility], [address], [place], [place] [address]   Alcohol, Ethyl (B) 11/13/2020 <10  <10 mg/dL Final   Comment: (NOTE) Lowest detectable limit for serum alcohol is 10 mg/dL.  For medical purposes only. Performed at [facility], [address] [place], [place] [address]    WBC 11/13/2020 7.1  4.0 - 10.5 K/uL Final   RBC 11/13/2020 5.02  4.22 - 5.81 MIL/uL Final   Hemoglobin 11/13/2020 16.0  13.0 - 17.0 g/dL Final   HCT 95/18/8416 46.2  39.0 - 52.0 % Final   MCV 11/13/2020 92.0  80.0 - 100.0 fL Final   MCH 11/13/2020 31.9  26.0 - 34.0 pg Final   MCHC 11/13/2020 34.6  30.0 - 36.0 g/dL Final   RDW 60/63/0160  13.6  11.5 - 15.5 % Final   Platelets 11/13/2020 269  150 - 400 K/uL Final   nRBC 11/13/2020 0.0  0.0 - 0.2 % Final   Performed at [facility], [address] [place], [place] [address]   Opiates 11/13/2020 NONE DETECTED  NONE DETECTED Final   Cocaine 11/13/2020 NONE DETECTED  NONE DETECTED Final   Benzodiazepines 11/13/2020 NONE DETECTED  NONE DETECTED Final   Amphetamines 11/13/2020 NONE DETECTED  NONE DETECTED Final   Tetrahydrocannabinol 11/13/2020 NONE DETECTED  NONE DETECTED Final   Barbiturates 11/13/2020 NONE DETECTED  NONE DETECTED Final   Comment: (NOTE) DRUG SCREEN FOR MEDICAL PURPOSES ONLY.  IF CONFIRMATION IS NEEDED FOR ANY PURPOSE, NOTIFY LAB WITHIN 5 DAYS.  LOWEST DETECTABLE LIMITS FOR URINE DRUG SCREEN Drug Class  Cutoff (ng/mL) Amphetamine and metabolites    1000 Barbiturate and metabolites    200 Benzodiazepine                 200 Tricyclics and metabolites     300 Opiates and metabolites        300 Cocaine and metabolites        300 THC                            50 Performed at [facility], [address] [place], [place] [address]    Acetaminophen (Tylenol), Serum 11/13/2020 <10 (A)  10 - 30 ug/mL Final   Comment: (NOTE) Therapeutic concentrations vary significantly. A range of 10-30 ug/mL  may be an effective concentration for many patients. However, some  are best treated at concentrations outside of this range. Acetaminophen concentrations >150 ug/mL at 4 hours after ingestion  and >50 ug/mL at 12 hours after ingestion are often associated with  toxic reactions.  Performed at [facility], [address], [place], [place] [address]    Salicylate Lvl 11/13/2020 <7.0 (A)  7.0 - 30.0 mg/dL Final   Performed at [facility], [address] [place], [place] [address]   SARS Coronavirus 2 by RT PCR 11/13/2020 NEGATIVE  NEGATIVE Final   Comment: (NOTE) SARS-CoV-2 target nucleic acids are NOT DETECTED.  The  SARS-CoV-2 RNA is generally detectable in upper respiratory specimens during the acute phase of infection. The lowest concentration of SARS-CoV-2 viral copies this assay can detect is 138 copies/mL. A negative result does not preclude SARS-Cov-2 infection and should not be used as the sole basis for treatment or other patient management decisions. A negative result may occur with  improper specimen collection/handling, submission of specimen other than nasopharyngeal swab, presence of viral mutation(s) within the areas targeted by this assay, and inadequate number of viral copies(<138 copies/mL). A negative result must be combined with clinical observations, patient history, and epidemiological information. The expected result is Negative.  Fact Sheet for Patients:  BloggerCourse.com  Fact Sheet for Healthcare Providers:  SeriousBroker.it  This test is no                          t yet approved or cleared by the [place] FDA and  has been authorized for detection and/or diagnosis of SARS-CoV-2 by FDA under an Emergency Use Authorization (EUA). This EUA will remain  in effect (meaning this test can be used) for the duration of the COVID-19 declaration under Section 564(b)(1) of the Act, 21 U.S.C.section 360bbb-3(b)(1), unless the authorization is terminated  or revoked sooner.       Influenza A by PCR 11/13/2020 NEGATIVE  NEGATIVE Final   Influenza B by PCR 11/13/2020 NEGATIVE  NEGATIVE Final   Comment: (NOTE) The Xpert Xpress SARS-CoV-2/FLU/RSV plus assay is intended as an aid in the diagnosis of influenza from Nasopharyngeal swab specimens and should not be used as a sole basis for treatment. Nasal washings and aspirates are unacceptable for Xpert Xpress SARS-CoV-2/FLU/RSV testing.  Fact Sheet for Patients: BloggerCourse.com  Fact Sheet for Healthcare  Providers: SeriousBroker.it  This test is not yet approved or cleared by the [place] FDA and has been authorized for detection and/or diagnosis of SARS-CoV-2 by FDA under an Emergency Use Authorization (EUA). This EUA will remain in effect (meaning this test can be used) for  the duration of the COVID-19 declaration under Section 564(b)(1) of the Act, 21 U.S.C. section 360bbb-3(b)(1), unless the authorization is terminated or revoked.  Performed at [facility], [address] [place], [place] [address]   Admission on 10/12/2020, Discharged on 10/16/2020  Component Date Value Ref Range Status   Valproic Acid Lvl 10/12/2020 29 (A)  50.0 - 100.0 ug/mL Final   Performed at [facility], [address] [place], [place] [address]  Admission on 10/11/2020, Discharged on 10/12/2020  Component Date Value Ref Range Status   SARS Coronavirus 2 by RT PCR 10/11/2020 NEGATIVE  NEGATIVE Final   Comment: (NOTE) SARS-CoV-2 target nucleic acids are NOT DETECTED.  The SARS-CoV-2 RNA is generally detectable in upper respiratory specimens during the acute phase of infection. The lowest concentration of SARS-CoV-2 viral copies this assay can detect is 138 copies/mL. A negative result does not preclude SARS-Cov-2 infection and should not be used as the sole basis for treatment or other patient management decisions. A negative result may occur with  improper specimen collection/handling, submission of specimen other than nasopharyngeal swab, presence of viral mutation(s) within the areas targeted by this assay, and inadequate number of viral copies(<138 copies/mL). A negative result must be combined with clinical observations, patient history, and epidemiological information. The expected result is Negative.  Fact Sheet for Patients:  BloggerCourse.com  Fact Sheet for Healthcare Providers:   SeriousBroker.it  This test is no                          t yet approved or cleared by the [place] FDA and  has been authorized for detection and/or diagnosis of SARS-CoV-2 by FDA under an Emergency Use Authorization (EUA). This EUA will remain  in effect (meaning this test can be used) for the duration of the COVID-19 declaration under Section 564(b)(1) of the Act, 21 U.S.C.section 360bbb-3(b)(1), unless the authorization is terminated  or revoked sooner.       Influenza A by PCR 10/11/2020 NEGATIVE  NEGATIVE Final   Influenza B by PCR 10/11/2020 NEGATIVE  NEGATIVE Final   Comment: (NOTE) The Xpert Xpress SARS-CoV-2/FLU/RSV plus assay is intended as an aid in the diagnosis of influenza from Nasopharyngeal swab specimens and should not be used as a sole basis for treatment. Nasal washings and aspirates are unacceptable for Xpert Xpress SARS-CoV-2/FLU/RSV testing.  Fact Sheet for Patients: BloggerCourse.com  Fact Sheet for Healthcare Providers: SeriousBroker.it  This test is not yet approved or cleared by the [place] FDA and has been authorized for detection and/or diagnosis of SARS-CoV-2 by FDA under an Emergency Use Authorization (EUA). This EUA will remain in effect (meaning this test can be used) for the duration of the COVID-19 declaration under Section 564(b)(1) of the Act, 21 U.S.C. section 360bbb-3(b)(1), unless the authorization is terminated or revoked.  Performed at [facility], [address] [place], [place] [address]    Sodium 10/11/2020 132 (A)  135 - 145 mmol/L Final   Potassium 10/11/2020 3.5  3.5 - 5.1 mmol/L Final   Chloride 10/11/2020 99  98 - 111 mmol/L Final   CO2 10/11/2020 23  22 - 32 mmol/L Final   Glucose, Bld 10/11/2020 134 (A)  70 - 99 mg/dL Final   Glucose reference range applies only to samples taken after fasting for at least 8 hours.   BUN  10/11/2020 11  6 - 20 mg/dL Final   Creatinine, Ser  10/11/2020 0.78  0.61 - 1.24 mg/dL Final   Calcium 16/12/9602 9.0  8.9 - 10.3 mg/dL Final   Total Protein 54/11/8117 6.9  6.5 - 8.1 g/dL Final   Albumin 14/78/2956 4.2  3.5 - 5.0 g/dL Final   AST 21/30/8657 24  15 - 41 U/L Final   ALT 10/11/2020 21  0 - 44 U/L Final   Alkaline Phosphatase 10/11/2020 77  38 - 126 U/L Final   Total Bilirubin 10/11/2020 1.1  0.3 - 1.2 mg/dL Final   GFR, Estimated 10/11/2020 >60  >60 mL/min Final   Comment: (NOTE) Calculated using the CKD-EPI Creatinine Equation (2021)    Anion gap 10/11/2020 10  5 - 15 Final   Performed at [facility], [address], [place], [place] [address]   Alcohol, Ethyl (B) 10/11/2020 <10  <10 mg/dL Final   Comment: (NOTE) Lowest detectable limit for serum alcohol is 10 mg/dL.  For medical purposes only. Performed at [facility], [address] [place], [place] [address]    Opiates 10/11/2020 NONE DETECTED  NONE DETECTED Final   Cocaine 10/11/2020 NONE DETECTED  NONE DETECTED Final   Benzodiazepines 10/11/2020 NONE DETECTED  NONE DETECTED Final   Amphetamines 10/11/2020 NONE DETECTED  NONE DETECTED Final   Tetrahydrocannabinol 10/11/2020 NONE DETECTED  NONE DETECTED Final   Barbiturates 10/11/2020 NONE DETECTED  NONE DETECTED Final   Comment: (NOTE) DRUG SCREEN FOR MEDICAL PURPOSES ONLY.  IF CONFIRMATION IS NEEDED FOR ANY PURPOSE, NOTIFY LAB WITHIN 5 DAYS.  LOWEST DETECTABLE LIMITS FOR URINE DRUG SCREEN Drug Class                     Cutoff (ng/mL) Amphetamine and metabolites    1000 Barbiturate and metabolites    200 Benzodiazepine                 200 Tricyclics and metabolites     300 Opiates and metabolites        300 Cocaine and metabolites        300 THC                            50 Performed at [facility], [address], [place], [place] [address]    WBC 10/11/2020 10.3  4.0 - 10.5 K/uL Final   RBC 10/11/2020 4.48  4.22 - 5.81  MIL/uL Final   Hemoglobin 10/11/2020 13.7  13.0 - 17.0 g/dL Final   HCT 40/12/2723 39.6  39.0 - 52.0 % Final   MCV 10/11/2020 88.4  80.0 - 100.0 fL Final   MCH 10/11/2020 30.6  26.0 - 34.0 pg Final   MCHC 10/11/2020 34.6  30.0 - 36.0 g/dL Final   RDW 36/64/4034 12.6  11.5 - 15.5 % Final   Platelets 10/11/2020 362  150 - 400 K/uL Final   nRBC 10/11/2020 0.0  0.0 - 0.2 % Final   Neutrophils Relative % 10/11/2020 73  % Final   Neutro Abs 10/11/2020 7.4  1.7 - 7.7 K/uL Final   Lymphocytes Relative 10/11/2020 15  % Final   Lymphs Abs 10/11/2020 1.5  0.7 - 4.0 K/uL Final   Monocytes Relative 10/11/2020 10  % Final   Monocytes Absolute 10/11/2020 1.1 (A)  0.1 - 1.0 K/uL Final   Eosinophils Relative 10/11/2020 2  % Final   Eosinophils Absolute 10/11/2020 0.2  0.0 - 0.5 K/uL Final   Basophils Relative  10/11/2020 0  % Final   Basophils Absolute 10/11/2020 0.0  0.0 - 0.1 K/uL Final   Immature Granulocytes 10/11/2020 0  % Final   Abs Immature Granulocytes 10/11/2020 0.04  0.00 - 0.07 K/uL Final   Performed at [facility], [address] [place], [place] [address]   Salicylate Lvl 10/11/2020 <7.0 (A)  7.0 - 30.0 mg/dL Final   Performed at [facility], [address] [place], [place] [address]   Acetaminophen (Tylenol), Serum 10/11/2020 <10 (A)  10 - 30 ug/mL Final   Comment: (NOTE) Therapeutic concentrations vary significantly. A range of 10-30 ug/mL  may be an effective concentration for many patients. However, some  are best treated at concentrations outside of this range. Acetaminophen concentrations >150 ug/mL at 4 hours after ingestion  and >50 ug/mL at 12 hours after ingestion are often associated with  toxic reactions.  Performed at [facility], [address], [place], [place] [address]   Admission on 10/06/2020, Discharged on 10/07/2020  Component Date Value Ref Range Status   WBC 10/06/2020 7.1  4.0 - 10.5 K/uL Final   RBC 10/06/2020 4.45  4.22 - 5.81 MIL/uL  Final   Hemoglobin 10/06/2020 13.7  13.0 - 17.0 g/dL Final   HCT 18/33/5825 40.3  39.0 - 52.0 % Final   MCV 10/06/2020 90.6  80.0 - 100.0 fL Final   MCH 10/06/2020 30.8  26.0 - 34.0 pg Final   MCHC 10/06/2020 34.0  30.0 - 36.0 g/dL Final   RDW 18/98/4210 12.7  11.5 - 15.5 % Final   Platelets 10/06/2020 286  150 - 400 K/uL Final   nRBC 10/06/2020 0.0  0.0 - 0.2 % Final   Performed at [facility], [address] [place], [place] [address]   Salicylate Lvl 10/06/2020 <7.0 (A)  7.0 - 30.0 mg/dL Final   Performed at [facility], [address] [place], [place] [address]   Acetaminophen (Tylenol), Serum 10/06/2020 <10 (A)  10 - 30 ug/mL Final   Comment: (NOTE) Therapeutic concentrations vary significantly. A range of 10-30 ug/mL  may be an effective concentration for many patients. However, some  are best treated at concentrations outside of this range. Acetaminophen concentrations >150 ug/mL at 4 hours after ingestion  and >50 ug/mL at 12 hours after ingestion are often associated with  toxic reactions.  Performed at [facility], [address] [place], [place] [address]    Sodium 10/06/2020 137  135 - 145 mmol/L Final   Potassium 10/06/2020 3.1 (A)  3.5 - 5.1 mmol/L Final   Chloride 10/06/2020 100  98 - 111 mmol/L Final   CO2 10/06/2020 25  22 - 32 mmol/L Final   Glucose, Bld 10/06/2020 104 (A)  70 - 99 mg/dL Final   Glucose reference range applies only to samples taken after fasting for at least 8 hours.   BUN 10/06/2020 12  6 - 20 mg/dL Final   Creatinine, Ser 10/06/2020 0.62  0.61 - 1.24 mg/dL Final   Calcium 68/15/9470 8.9  8.9 - 10.3 mg/dL Final   Total Protein 76/15/1834 7.5  6.5 - 8.1 g/dL Final   Albumin 37/35/7897 4.9  3.5 - 5.0 g/dL Final   AST 84/78/4128 26  15 - 41 U/L Final   ALT 10/06/2020 31  0 - 44 U/L Final   Alkaline Phosphatase 10/06/2020 60  38 - 126 U/L Final   Total Bilirubin 10/06/2020 1.3 (A)  0.3  - 1.2 mg/dL Final   GFR, Estimated 10/06/2020 >60  >60 mL/min Final   Comment: (NOTE) Calculated using the CKD-EPI Creatinine Equation (2021)    Anion gap 10/06/2020 12  5 - 15 Final   Performed at [facility], [address], [place], [place] [address]   Alcohol, Ethyl (B) 10/06/2020 <10  <10 mg/dL Final   Comment: (NOTE) Lowest detectable limit for serum alcohol is 10 mg/dL.  For medical purposes only. Performed at [facility], [address], [place], [place] [address]    SARS Coronavirus 2 by RT PCR 10/06/2020 NEGATIVE  NEGATIVE Final   Comment: (NOTE) SARS-CoV-2 target nucleic acids are NOT DETECTED.  The SARS-CoV-2 RNA is generally detectable in upper respiratory specimens during the acute phase of infection. The lowest concentration of SARS-CoV-2 viral copies this assay can detect is 138 copies/mL. A negative result does not preclude SARS-Cov-2 infection and should not be used as the sole basis for treatment or other patient management decisions. A negative result may occur with  improper specimen collection/handling, submission of specimen other than nasopharyngeal swab, presence of viral mutation(s) within the areas targeted by this assay, and inadequate number of viral copies(<138 copies/mL). A negative result must be combined with clinical observations, patient history, and epidemiological information. The expected result is Negative.  Fact Sheet for Patients:  BloggerCourse.com  Fact Sheet for Healthcare Providers:  SeriousBroker.it  This test is no                          t yet approved or cleared by the [place] FDA and  has been authorized for detection and/or diagnosis of SARS-CoV-2 by FDA under an Emergency Use Authorization (EUA). This EUA will remain  in effect (meaning this test can be used) for the duration of the COVID-19 declaration under Section 564(b)(1)  of the Act, 21 U.S.C.section 360bbb-3(b)(1), unless the authorization is terminated  or revoked sooner.       Influenza A by PCR 10/06/2020 NEGATIVE  NEGATIVE Final   Influenza B by PCR 10/06/2020 NEGATIVE  NEGATIVE Final   Comment: (NOTE) The Xpert Xpress SARS-CoV-2/FLU/RSV plus assay is intended as an aid in the diagnosis of influenza from Nasopharyngeal swab specimens and should not be used as a sole basis for treatment. Nasal washings and aspirates are unacceptable for Xpert Xpress SARS-CoV-2/FLU/RSV testing.  Fact Sheet for Patients: BloggerCourse.com  Fact Sheet for Healthcare Providers: SeriousBroker.it  This test is not yet approved or cleared by the [place] FDA and has been authorized for detection and/or diagnosis of SARS-CoV-2 by FDA under an Emergency Use Authorization (EUA). This EUA will remain in effect (meaning this test can be used) for the duration of the COVID-19 declaration under Section 564(b)(1) of the Act, 21 U.S.C. section 360bbb-3(b)(1), unless the authorization is terminated or revoked.  Performed at [facility], [address] [place], [place] [address]    Opiates 10/07/2020 NONE DETECTED  NONE DETECTED Final   Cocaine 10/07/2020 NONE DETECTED  NONE DETECTED Final   Benzodiazepines 10/07/2020 NONE DETECTED  NONE DETECTED Final   Amphetamines 10/07/2020 POSITIVE (A)  NONE DETECTED Final   Tetrahydrocannabinol 10/07/2020 NONE DETECTED  NONE DETECTED Final   Barbiturates 10/07/2020 NONE DETECTED  NONE DETECTED Final   Comment: (NOTE) DRUG SCREEN FOR MEDICAL PURPOSES ONLY.  IF CONFIRMATION IS NEEDED FOR ANY PURPOSE, NOTIFY LAB WITHIN 5 DAYS.  LOWEST DETECTABLE LIMITS FOR URINE  DRUG SCREEN Drug Class                     Cutoff (ng/mL) Amphetamine and metabolites    1000 Barbiturate and metabolites    200 Benzodiazepine                 200 Tricyclics and metabolites      300 Opiates and metabolites        300 Cocaine and metabolites        300 THC                            50 Performed at [facility], [address], [place], [place] [address]   Admission on 09/25/2020, Discharged on 10/01/2020  Component Date Value Ref Range Status   Cholesterol 09/27/2020 161  0 - 200 mg/dL Final   Triglycerides 65/78/4696 156 (A)  <150 mg/dL Final   HDL 29/52/8413 52  >40 mg/dL Final   Total CHOL/HDL Ratio 09/27/2020 3.1  RATIO Final   VLDL 09/27/2020 31  0 - 40 mg/dL Final   LDL Cholesterol 09/27/2020 78  0 - 99 mg/dL Final   Comment:        Total Cholesterol/HDL:CHD Risk Coronary Heart Disease Risk Table                     Men   Women  1/2 Average Risk   3.4   3.3  Average Risk       5.0   4.4  2 X Average Risk   9.6   7.1  3 X Average Risk  23.4   11.0        Use the calculated Patient Ratio above and the CHD Risk Table to determine the patient's CHD Risk.        ATP III CLASSIFICATION (LDL):  <100     mg/dL   Optimal  244-010  mg/dL   Near or Above                    Optimal  130-159  mg/dL   Borderline  272-536  mg/dL   High  >644     mg/dL   Very High Performed at [facility], [address], [place], [place] [address]    Hgb A1c MFr Bld 09/27/2020 5.2  4.8 - 5.6 % Final   Comment: (NOTE)         Prediabetes: 5.7 - 6.4         Diabetes: >6.4         Glycemic control for adults with diabetes: <7.0    Mean Plasma Glucose 09/27/2020 103  mg/dL Final   Comment: (NOTE) Performed At: [facility] [address] [place], [place] [id number] Jolene Schimke MD [id number]    WBC 10/01/2020 8.8  4.0 - 10.5 K/uL Final   RBC 10/01/2020 4.64  4.22 - 5.81 MIL/uL Final   Hemoglobin 10/01/2020 14.2  13.0 - 17.0 g/dL Final   HCT 66/08/3014 42.5  39.0 - 52.0 % Final   MCV 10/01/2020 91.6  80.0 - 100.0 fL Final   MCH 10/01/2020 30.6  26.0 - 34.0 pg Final   MCHC 10/01/2020 33.4  30.0 - 36.0 g/dL Final    RDW 03/11/3233 12.9  11.5 - 15.5 % Final   Platelets 10/01/2020 271  150 - 400 K/uL Final   nRBC 10/01/2020 0.0  0.0 - 0.2 % Final   Neutrophils Relative % 10/01/2020 56  % Final   Neutro Abs 10/01/2020 4.9  1.7 - 7.7 K/uL Final   Lymphocytes Relative 10/01/2020 24  % Final   Lymphs Abs 10/01/2020 2.1  0.7 - 4.0 K/uL Final   Monocytes Relative 10/01/2020 9  % Final   Monocytes Absolute 10/01/2020 0.8  0.1 - 1.0 K/uL Final   Eosinophils Relative 10/01/2020 6  % Final   Eosinophils Absolute 10/01/2020 0.5  0.0 - 0.5 K/uL Final   Basophils Relative 10/01/2020 1  % Final   Basophils Absolute 10/01/2020 0.1  0.0 - 0.1 K/uL Final   Immature Granulocytes 10/01/2020 4  % Final   Abs Immature Granulocytes 10/01/2020 0.33 (A)  0.00 - 0.07 K/uL Final   Performed at [facility], [address], [place], [place] [address]   Sodium 10/01/2020 140  135 - 145 mmol/L Final   Potassium 10/01/2020 4.0  3.5 - 5.1 mmol/L Final   Chloride 10/01/2020 105  98 - 111 mmol/L Final   CO2 10/01/2020 22  22 - 32 mmol/L Final   Glucose, Bld 10/01/2020 90  70 - 99 mg/dL Final   Glucose reference range applies only to samples taken after fasting for at least 8 hours.   BUN 10/01/2020 16  6 - 20 mg/dL Final   Creatinine, Ser 10/01/2020 0.67  0.61 - 1.24 mg/dL Final   Calcium 60/45/4098 9.2  8.9 - 10.3 mg/dL Final   Total Protein 11/91/4782 6.9  6.5 - 8.1 g/dL Final   Albumin 95/62/1308 4.4  3.5 - 5.0 g/dL Final   AST 65/78/4696 32  15 - 41 U/L Final   ALT 10/01/2020 31  0 - 44 U/L Final   Alkaline Phosphatase 10/01/2020 51  38 - 126 U/L Final   Total Bilirubin 10/01/2020 0.5  0.3 - 1.2 mg/dL Final   GFR, Estimated 10/01/2020 >60  >60 mL/min Final   Comment: (NOTE) Calculated using the CKD-EPI Creatinine Equation (2021)    Anion gap 10/01/2020 13  5 - 15 Final   Performed at [facility], [address] [place], [place] [address]   Valproic Acid Lvl 10/01/2020 66  50.0 -  100.0 ug/mL Final   Performed at [facility], [address] [place], [place] [address]  Admission on 09/21/2020, Discharged on 09/25/2020  Component Date Value Ref Range Status   Sodium 09/21/2020 139  135 - 145 mmol/L Final   Potassium 09/21/2020 4.0  3.5 - 5.1 mmol/L Final   Chloride 09/21/2020 103  98 - 111 mmol/L Final   CO2 09/21/2020 25  22 - 32 mmol/L Final   Glucose, Bld 09/21/2020 111 (A)  70 - 99 mg/dL Final   Glucose reference range applies only to samples taken after fasting for at least 8 hours.   BUN 09/21/2020 15  6 - 20 mg/dL Final   Creatinine, Ser 09/21/2020 0.95  0.61 - 1.24 mg/dL Final   Calcium 40/12/2723 9.4  8.9 - 10.3 mg/dL Final   Total Protein 36/64/4034 6.8  6.5 - 8.1 g/dL Final   Albumin 74/25/9563 4.4  3.5 - 5.0 g/dL Final   AST 87/56/4332 15  15 - 41 U/L Final   ALT 09/21/2020 13  0 - 44 U/L Final   Alkaline Phosphatase 09/21/2020 62  38 - 126 U/L Final   Total Bilirubin 09/21/2020 0.6  0.3 - 1.2 mg/dL Final   GFR, Estimated 09/21/2020 >60  >  60 mL/min Final   Comment: (NOTE) Calculated using the CKD-EPI Creatinine Equation (2021)    Anion gap 09/21/2020 11  5 - 15 Final   Performed at [facility], [address] [place], [place] [address]   Alcohol, Ethyl (B) 09/21/2020 <10  <10 mg/dL Final   Comment: (NOTE) Lowest detectable limit for serum alcohol is 10 mg/dL.  For medical purposes only. Performed at [facility], [address] [place], [place] [address]    Salicylate Lvl 09/21/2020 <7.0 (A)  7.0 - 30.0 mg/dL Final   Performed at [facility], [address], [place], [place] [address]   Acetaminophen (Tylenol), Serum 09/21/2020 <10 (A)  10 - 30 ug/mL Final   Comment: (NOTE) Therapeutic concentrations vary significantly. A range of 10-30 ug/mL  may be an effective concentration for many patients. However, some  are best treated at concentrations outside of this range. Acetaminophen concentrations >150  ug/mL at 4 hours after ingestion  and >50 ug/mL at 12 hours after ingestion are often associated with  toxic reactions.  Performed at [facility], [address], [place], [place] [address]    WBC 09/21/2020 10.5  4.0 - 10.5 K/uL Final   RBC 09/21/2020 4.58  4.22 - 5.81 MIL/uL Final   Hemoglobin 09/21/2020 13.9  13.0 - 17.0 g/dL Final   HCT 29/56/2130 40.0  39.0 - 52.0 % Final   MCV 09/21/2020 87.3  80.0 - 100.0 fL Final   MCH 09/21/2020 30.3  26.0 - 34.0 pg Final   MCHC 09/21/2020 34.8  30.0 - 36.0 g/dL Final   RDW 86/57/8469 12.3  11.5 - 15.5 % Final   Platelets 09/21/2020 292  150 - 400 K/uL Final   nRBC 09/21/2020 0.0  0.0 - 0.2 % Final   Performed at [facility], [address] [place], [place] [address]   Opiates 09/21/2020 NONE DETECTED  NONE DETECTED Final   Cocaine 09/21/2020 NONE DETECTED  NONE DETECTED Final   Benzodiazepines 09/21/2020 NONE DETECTED  NONE DETECTED Final   Amphetamines 09/21/2020 NONE DETECTED  NONE DETECTED Final   Tetrahydrocannabinol 09/21/2020 NONE DETECTED  NONE DETECTED Final   Barbiturates 09/21/2020 NONE DETECTED  NONE DETECTED Final   Comment: (NOTE) DRUG SCREEN FOR MEDICAL PURPOSES ONLY.  IF CONFIRMATION IS NEEDED FOR ANY PURPOSE, NOTIFY LAB WITHIN 5 DAYS.  LOWEST DETECTABLE LIMITS FOR URINE DRUG SCREEN Drug Class                     Cutoff (ng/mL) Amphetamine and metabolites    1000 Barbiturate and metabolites    200 Benzodiazepine                 200 Tricyclics and metabolites     300 Opiates and metabolites        300 Cocaine and metabolites        300 THC                            50 Performed at [facility], [address] [place], [place] [address]    SARS Coronavirus 2 by RT PCR 09/21/2020 NEGATIVE  NEGATIVE Final   Comment: (NOTE) SARS-CoV-2 target nucleic acids are NOT DETECTED.  The SARS-CoV-2 RNA is generally detectable in upper respiratory specimens during the acute phase of infection. The  lowest concentration of SARS-CoV-2 viral copies this assay can detect is  138 copies/mL. A negative result does not preclude SARS-Cov-2 infection and should not be used as the sole basis for treatment or other patient management decisions. A negative result may occur with  improper specimen collection/handling, submission of specimen other than nasopharyngeal swab, presence of viral mutation(s) within the areas targeted by this assay, and inadequate number of viral copies(<138 copies/mL). A negative result must be combined with clinical observations, patient history, and epidemiological information. The expected result is Negative.  Fact Sheet for Patients:  BloggerCourse.com  Fact Sheet for Healthcare Providers:  SeriousBroker.it  This test is no                          t yet approved or cleared by the [place] FDA and  has been authorized for detection and/or diagnosis of SARS-CoV-2 by FDA under an Emergency Use Authorization (EUA). This EUA will remain  in effect (meaning this test can be used) for the duration of the COVID-19 declaration under Section 564(b)(1) of the Act, 21 U.S.C.section 360bbb-3(b)(1), unless the authorization is terminated  or revoked sooner.       Influenza A by PCR 09/21/2020 NEGATIVE  NEGATIVE Final   Influenza B by PCR 09/21/2020 NEGATIVE  NEGATIVE Final   Comment: (NOTE) The Xpert Xpress SARS-CoV-2/FLU/RSV plus assay is intended as an aid in the diagnosis of influenza from Nasopharyngeal swab specimens and should not be used as a sole basis for treatment. Nasal washings and aspirates are unacceptable for Xpert Xpress SARS-CoV-2/FLU/RSV testing.  Fact Sheet for Patients: BloggerCourse.com  Fact Sheet for Healthcare Providers: SeriousBroker.it  This test is not yet approved or cleared by the [place] FDA and has been authorized for  detection and/or diagnosis of SARS-CoV-2 by FDA under an Emergency Use Authorization (EUA). This EUA will remain in effect (meaning this test can be used) for the duration of the COVID-19 declaration under Section 564(b)(1) of the Act, 21 U.S.C. section 360bbb-3(b)(1), unless the authorization is terminated or revoked.  Performed at [facility], [address], [place], [place] [address]    Color, Urine 09/21/2020 YELLOW  YELLOW Final   APPearance 09/21/2020 CLEAR  CLEAR Final   Specific Gravity, Urine 09/21/2020 1.014  1.005 - 1.030 Final   pH 09/21/2020 7.0  5.0 - 8.0 Final   Glucose, UA 09/21/2020 NEGATIVE  NEGATIVE mg/dL Final   Hgb urine dipstick 09/21/2020 NEGATIVE  NEGATIVE Final   Bilirubin Urine 09/21/2020 NEGATIVE  NEGATIVE Final   Ketones, ur 09/21/2020 NEGATIVE  NEGATIVE mg/dL Final   Protein, ur 60/45/4098 NEGATIVE  NEGATIVE mg/dL Final   Nitrite 11/91/4782 NEGATIVE  NEGATIVE Final   Leukocytes,Ua 09/21/2020 NEGATIVE  NEGATIVE Final   Performed at [facility], [address], [place], [place] [address]   Lithium Lvl 09/23/2020 0.39 (A)  0.60 - 1.20 mmol/L Final   Performed at [facility], [address], [place], [place] [address]   Sodium 09/23/2020 140  135 - 145 mmol/L Final   Potassium 09/23/2020 3.9  3.5 - 5.1 mmol/L Final   Chloride 09/23/2020 105  98 - 111 mmol/L Final   BUN 09/23/2020 14  6 - 20 mg/dL Final   Creatinine, Ser 09/23/2020 0.80  0.61 - 1.24 mg/dL Final   Glucose, Bld 78/46/9629 96  70 - 99 mg/dL Final   Glucose reference range applies only to samples taken after fasting for at least 8 hours.   Calcium, Ion 09/23/2020 1.22  1.15 - 1.40 mmol/L Final   TCO2 09/23/2020 26  22 - 32 mmol/L Final   Hemoglobin 09/23/2020 14.3  13.0 - 17.0 g/dL Final   HCT 16/12/9602 42.0  39.0 - 52.0 % Final   Sodium 09/23/2020 138  135 - 145 mmol/L Final   Potassium 09/23/2020 4.1  3.5 - 5.1 mmol/L Final   Chloride 09/23/2020 105  98 - 111 mmol/L  Final   CO2 09/23/2020 25  22 - 32 mmol/L Final   Glucose, Bld 09/23/2020 99  70 - 99 mg/dL Final   Glucose reference range applies only to samples taken after fasting for at least 8 hours.   BUN 09/23/2020 13  6 - 20 mg/dL Final   Creatinine, Ser 09/23/2020 0.91  0.61 - 1.24 mg/dL Final   Calcium 54/11/8117 9.2  8.9 - 10.3 mg/dL Final   Total Protein 14/78/2956 6.2 (A)  6.5 - 8.1 g/dL Final   Albumin 21/30/8657 4.0  3.5 - 5.0 g/dL Final   AST 84/69/6295 13 (A)  15 - 41 U/L Final   ALT 09/23/2020 11  0 - 44 U/L Final   Alkaline Phosphatase 09/23/2020 49  38 - 126 U/L Final   Total Bilirubin 09/23/2020 0.7  0.3 - 1.2 mg/dL Final   GFR, Estimated 09/23/2020 >60  >60 mL/min Final   Comment: (NOTE) Calculated using the CKD-EPI Creatinine Equation (2021)    Anion gap 09/23/2020 8  5 - 15 Final   Performed at [facility], [address], [place], [place] [address]   WBC 09/23/2020 9.6  4.0 - 10.5 K/uL Final   RBC 09/23/2020 4.84  4.22 - 5.81 MIL/uL Final   Hemoglobin 09/23/2020 14.8  13.0 - 17.0 g/dL Final   HCT 24/40/1027 43.4  39.0 - 52.0 % Final   MCV 09/23/2020 89.7  80.0 - 100.0 fL Final   MCH 09/23/2020 30.6  26.0 - 34.0 pg Final   MCHC 09/23/2020 34.1  30.0 - 36.0 g/dL Final   RDW 25/36/6440 12.4  11.5 - 15.5 % Final   Platelets 09/23/2020 291  150 - 400 K/uL Final   nRBC 09/23/2020 0.0  0.0 - 0.2 % Final   Performed at [facility], [address] [place], [place] [address]   Glucose-Capillary 09/23/2020 100 (A)  70 - 99 mg/dL Final   Glucose reference range applies only to samples taken after fasting for at least 8 hours.   Comment 1 09/23/2020 Notify RN   Final   Troponin I (High Sensitivity) 09/23/2020 3  <18 ng/L Final   Comment: (NOTE) Elevated high sensitivity troponin I (hsTnI) values and significant  changes across serial measurements may suggest ACS but many other  chronic and acute conditions are known to elevate hsTnI results.  Refer to the "Links"  section for chest pain algorithms and additional  guidance. Performed at [facility], [address] [place], [place] [address]    Magnesium 09/23/2020 2.3  1.7 - 2.4 mg/dL Final   Performed at [facility], [address], [place], [place] [address]   Phosphorus 09/23/2020 3.5  2.5 - 4.6 mg/dL Final   Performed at [facility], [address] [place], [place] [address]   TSH 09/23/2020 1.957  0.350 - 4.500 uIU/mL Final   Comment: Performed by a 3rd Generation assay with a functional sensitivity of <=0.01 uIU/mL. Performed at [facility], [address] [place], [place] [address]  T3, Free 09/23/2020 3.3  2.0 - 4.4 pg/mL Final   Comment: (NOTE) Performed At: [facility] [address] [place], [place] [id number] Jolene Schimke MD YV:[id number]    SARS Coronavirus 2 09/24/2020 NEGATIVE  NEGATIVE Final   Comment: (NOTE) SARS-CoV-2 target nucleic acids are NOT DETECTED.  The SARS-CoV-2 RNA is generally detectable in upper and lower respiratory specimens during the acute phase of infection. Negative results do not preclude SARS-CoV-2 infection, do not rule out co-infections with other pathogens, and should not be used as the sole basis for treatment or other patient management decisions. Negative results must be combined with clinical observations, patient history, and epidemiological information. The expected result is Negative.  Fact Sheet for Patients: HairSlick.no  Fact Sheet for Healthcare Providers: quierodirigir.com  This test is not yet approved or cleared by the [place] FDA and  has been authorized for detection and/or diagnosis of SARS-CoV-2 by FDA under an Emergency Use Authorization (EUA). This EUA will remain  in effect (meaning this test can be used) for the duration of the COVID-19 declaration under Se                          ction 564(b)(1) of the Act, 21  U.S.C. section 360bbb-3(b)(1), unless the authorization is terminated or revoked sooner.  Performed at [facility], [address] [place], [place] [address]     Blood Alcohol level:  Lab Results  Component Value Date   ETH 325 [facility]) 01/12/2021   ETH <10 11/13/2020    Metabolic Disorder Labs: Lab Results  Component Value Date   HGBA1C 5.2 09/27/2020   MPG 103 09/27/2020   MPG 102.54 01/24/2020   No results found for: PROLACTIN Lab Results  Component Value Date   CHOL 198 01/12/2021   TRIG 281 (H) 01/12/2021   HDL 76 01/12/2021   CHOLHDL 2.6 01/12/2021   VLDL 56 (H) 01/12/2021   LDLCALC 66 01/12/2021   LDLCALC 78 09/27/2020    Therapeutic Lab Levels: Lab Results  Component Value Date   LITHIUM 0.39 (L) 09/23/2020   LITHIUM 0.38 (L) 01/27/2020   Lab Results  Component Value Date   VALPROATE 29 (L) 10/12/2020   VALPROATE 66 10/01/2020   No components found for:  CBMZ  Physical Findings   AIMS    Flowsheet Row [facility]  AIMS Total Score 0      AUDIT    Flowsheet Row [facility] Ad[facility]  Alcohol Use Disorder Identification Test Final Score (AUDIT) 0 8      Flowsheet Row ED from 01/13/2021 in [facility] ED from 01/12/2021 in [facility] from 11/14/2020 in [facility]  C-SSRS RISK CATEGORY High Risk High Risk High Risk        Musculoskeletal  Strength & Muscle Tone: within normal limits Gait & Station: normal Patient leans: N/A  Psychiatric Specialty Exam  Presentation  General Appearance: Appropriate for Environment  Eye Contact:Fair  Speech:Clear and Coherent  Speech Volume:Normal  Handedness:Right   Mood  and Affect  Mood:Depressed  Affect:Congruent   Thought Process  Thought Processes:Coherent  Descriptions of Associations:Intact  Orientation:Full (Time, Place and Person)  Thought Content:Logical  Diagnosis of Schizophrenia or Schizoaffective disorder in past: No  Hallucinations:Hallucinations: None Description of Auditory Hallucinations: none  Ideas of Reference:None  Suicidal Thoughts:Suicidal Thoughts: Yes, Passive SI Active Intent and/or Plan: With Plan; With Means to Carry Out; With Intent  Homicidal Thoughts:Homicidal Thoughts: No   Sensorium  Memory:Immediate Fair; Remote Fair; Recent Fair  Judgment:Intact  Insight:Present   Executive Functions  Concentration:Fair  Attention Span:Fair  Recall:Fair  Fund of Knowledge:Fair  Language:Fair   Psychomotor Activity  Psychomotor Activity:Psychomotor Activity: Tremor   Assets  Assets:Communication Skills; Desire for Improvement; Physical Health; Leisure Time   Sleep  Sleep:Sleep: Poor Number of Hours of Sleep: -1   Nutritional Assessment (For OBS and [facility] admissions only) Has the patient had a weight loss or gain of 10 pounds or more in the last 3 months?: No Has the patient had a decrease in food intake/or appetite?: No Does the patient have dental problems?: No Does the patient have eating habits or behaviors that may be indicators of an eating disorder including binging or inducing vomiting?: No Has the patient recently lost weight without trying?: 0 Has the patient been eating poorly because of a decreased appetite?: 0 Malnutrition Screening Tool Score: 0    Physical Exam  Physical Exam Constitutional:      Appearance: Normal appearance.  HENT:     Head: Normocephalic.     Nose: Nose normal.  Eyes:     Conjunctiva/sclera: Conjunctivae normal.  Cardiovascular:     Rate and Rhythm: Normal rate.  Pulmonary:     Effort: Pulmonary effort is normal.  Musculoskeletal:        General:  Normal range of motion.     Cervical back: Normal range of motion.  Neurological:     Mental Status: He is alert and oriented to person, place, and time.   Review of Systems  Constitutional:  Positive for chills.  HENT: Negative.    Eyes: Negative.   Respiratory: Negative.    Cardiovascular: Negative.   Gastrointestinal:  Positive for nausea.  Genitourinary: Negative.   Musculoskeletal: Negative.   Skin: Negative.   Neurological:  Positive for tremors.  Endo/Heme/Allergies: Negative.   Psychiatric/Behavioral:  Positive for depression, substance abuse and suicidal ideas.   Blood pressure 137/87, pulse 79, temperature 98.2 F (36.8 C), temperature source Oral, resp. rate 16, SpO2 97 %. There is no height or weight on file to calculate BMI.  Treatment Plan Summary: Patient admitted to the [facility] facility based crisis for mood stabilization and safety. Patient is voluntary.  Labs reviewed: EKG - QTC 449 K+ 3.1  Medications Meds ordered this encounter  Medications   acetaminophen (TYLENOL) tablet 650 mg   alum & mag hydroxide-simeth (MAALOX/MYLANTA) 200-200-20 MG/5ML suspension 30 mL   magnesium hydroxide (MILK OF MAGNESIA) suspension 30 mL   traZODone (DESYREL) tablet 50 mg   nicotine (NICODERM CQ - dosed in mg/24 hours) patch 21 mg   thiamine tablet 100 mg   multivitamin with minerals tablet 1 tablet   LORazepam (ATIVAN) tablet 1 mg   hydrOXYzine (ATARAX/VISTARIL) tablet 25 mg   loperamide (IMODIUM) capsule 2-4 mg   ondansetron (ZOFRAN-ODT) disintegrating tablet 4 mg   FOLLOWED BY Linked Order Group    LORazepam (ATIVAN) tablet 1 mg    LORazepam (ATIVAN) tablet 1 mg    LORazepam (ATIVAN) tablet 1 mg    LORazepam (ATIVAN) tablet 1 mg   potassium chloride SA (KLOR-CON) CR tablet 20 mEq   folic acid (FOLVITE) tablet 1 mg   albuterol (VENTOLIN HFA) 108 (90 Base) MCG/ACT inhaler 1-2  puff   OLANZapine (ZYPREXA) tablet 10 mg   FLUoxetine (PROZAC) capsule 20 mg   Layla Barter, NP 01/13/2021 11:28 AM

## 2021-01-13 NOTE — ED Notes (Signed)
Call to [facility] lab who confirms they have enough blood to complete lipid panel, magnesium, and TSH.

## 2021-01-13 NOTE — ED Notes (Signed)
Patient arrived to facility with assistance of safe transport. No sxs of distress noted. Vitals obtained, placed in assessment room and provider made aware of arrival

## 2021-01-13 NOTE — ED Provider Notes (Signed)
Behavioral Health Admission H&P [facility] & OBS)  Date: 01/14/21 Patient Name: Brett House MRN: [id number] Chief Complaint:  Chief Complaint  Patient presents with   Suicidal   Addiction Problem    Suicide attempt by over dose  Diagnoses:  Final diagnoses:  Bipolar I disorder (HCC)  Suicide attempt (HCC)  Alcohol use disorder, severe, dependence (HCC)    HPI: Brett House a 35 y/o single male with a history of Bipolar Disorder, Substance induced mood d/o, amphetamine abuse, suicidal ideation, cocaine use d/o and marijuana abuse was brought in to [facility] via EMS for an intentional over dose of fentanyl, heroin and alcohol. Patient is alert, oriented x4 is calm and cooperative with a flat affect and appears drowsy. During the assessment observed patient has some fine tremors in his hands and complains of nausea and has had seizures in the past when withdrawing from alcohol. Patient is currently unemployed and has been staying with different people of and on and is estranged from family and no other supports. Patient was found unconscious on the sidewalk. Patient endorses feelings of depression, decreased concentration, fatigue, crying spells and social isolation. Patient has had a previous suicide attempt by over dose in the past. Patient reports that he drinks a 12 pack of beer daily, blood alcohol  was 325 at admission. Patient has a history of  methamphetamine and cocaine abuse but states that's he not used either in a few months. UDS was negative except for methamphetamines.   Patient is being followed by providers at [facility] and has been prescribed  zyprexa 10mg  qhs, prozac 20mg , and vistaril 25 mg and trazodone 50 mg qhs but patient states the medications have not been controlling his symptoms. Educated patient that his medication effectiveness is deceased when he drinking and using substances.    Patient wants to come inpatient for treatment to detox from alcohol. Patient is voluntary and  will be admitted to [facility] for crisis management, safety and stability.  PHQ 2-9:   Flowsheet Row ED from 01/13/2021 in [facility] ED from 01/12/2021 in [facility] DEPT ED from 11/14/2020 in [facility]  C-SSRS RISK CATEGORY High Risk High Risk High Risk        Total Time spent with patient: 30 minutes  Musculoskeletal  Strength & Muscle Tone: within normal limits Gait & Station: normal Patient leans: N/A  Psychiatric Specialty Exam  Presentation General Appearance: Appropriate for Environment  Eye Contact:Fair  Speech:Clear and Coherent  Speech Volume:Normal  Handedness:Right   Mood and Affect  Mood:Depressed  Affect:Congruent   Thought Process  Thought Processes:Coherent  Descriptions of Associations:Intact  Orientation:Full (Time, Place and Person)  Thought Content:Logical  Diagnosis of Schizophrenia or Schizoaffective disorder in past: No   Hallucinations:Hallucinations: None Description of Auditory Hallucinations: none  Ideas of Reference:None  Suicidal Thoughts:Suicidal Thoughts: Yes, Passive SI Active Intent and/or Plan: With Plan; With Means to Carry Out; With Intent  Homicidal Thoughts:Homicidal Thoughts: No   Sensorium  Memory:Immediate Fair; Remote Fair; Recent Fair  Judgment:Intact  Insight:Present   Executive Functions  Concentration:Fair  Attention Span:Fair  Recall:Fair  Fund of Knowledge:Fair  Language:Fair   Psychomotor Activity  Psychomotor Activity:Psychomotor Activity: Tremor   Assets  Assets:Communication Skills; Desire for Improvement; Physical Health; Leisure Time   Sleep  Sleep:Sleep: Poor Number of Hours of Sleep: -1   Nutritional Assessment (For OBS and FBC admissions only) Has the patient had a weight loss or gain of  10 pounds or more in the last 3 months?: No Has the patient had a decrease in food  intake/or appetite?: No Does the patient have dental problems?: No Does the patient have eating habits or behaviors that may be indicators of an eating disorder including binging or inducing vomiting?: No Has the patient recently lost weight without trying?: 0 Has the patient been eating poorly because of a decreased appetite?: 0 Malnutrition Screening Tool Score: 0   Physical Exam HENT:     Head: Normocephalic.     Nose: Nose normal.  Eyes:     Pupils: Pupils are equal, round, and reactive to light.  Cardiovascular:     Rate and Rhythm: Normal rate.  Pulmonary:     Effort: Pulmonary effort is normal.  Abdominal:     General: Abdomen is flat.  Musculoskeletal:        General: Normal range of motion.     Cervical back: Normal range of motion.  Skin:    General: Skin is warm.  Neurological:     General: No focal deficit present.     Mental Status: He is alert.  Psychiatric:        Attention and Perception: Attention normal.        Mood and Affect: Mood is depressed. Affect is flat.        Speech: Speech normal.        Behavior: Behavior is cooperative.        Thought Content: Thought content includes suicidal ideation. Thought content does not include homicidal ideation. Thought content includes suicidal plan. Thought content does not include homicidal plan.        Cognition and Memory: Cognition normal.        Judgment: Judgment is impulsive.   Review of Systems  Constitutional: Negative.   HENT: Negative.    Eyes: Negative.   Respiratory: Negative.    Cardiovascular: Negative.   Gastrointestinal: Negative.   Genitourinary: Negative.   Musculoskeletal: Negative.   Skin: Negative.   Neurological: Negative.   Endo/Heme/Allergies: Negative.   Psychiatric/Behavioral:  Positive for depression and suicidal ideas. The patient is nervous/anxious.    Blood pressure 128/80, pulse 92, temperature 97.9 F (36.6 C), resp. rate 20, SpO2 98 %. There is no height or weight on file  to calculate BMI.  Past Psychiatric History: Inpatient treatment at [facility] in July 2022 and August 2022  Is the patient at risk to self? Yes  Has the patient been a risk to self in the past 6 months? Yes .    Has the patient been a risk to self within the distant past? Yes   Is the patient a risk to others? No   Has the patient been a risk to others in the past 6 months? No   Has the patient been a risk to others within the distant past? No   Past Medical History:  Past Medical History:  Diagnosis Date   Anxiety    Asthma    Bipolar disorder (HCC)    Depression    No past surgical history on file.  Family History: No family history on file.  Social History:  Social History   Socioeconomic History   Marital status: Single    Spouse name: Not on file   Number of children: 0   Years of education: Not on file   Highest education level: 12th grade  Occupational History   Occupation: Unemployed  Tobacco Use   Smoking status: Every Day  Packs/day: 1.50    Years: 15.00    Pack years: 22.50    Types: Cigarettes   Smokeless tobacco: Current  Vaping Use   Vaping Use: Never used  Substance and Sexual Activity   Alcohol use: Yes    Comment: 6 pack once a week for last 8-9 months   Drug use: Yes    Types: Marijuana    Comment: rarely   Sexual activity: Yes  Other Topics Concern   Not on file  Social History Narrative   Not on file   Social Determinants of Health   Financial Resource Strain: Not on file  Food Insecurity: Not on file  Transportation Needs: Not on file  Physical Activity: Not on file  Stress: Not on file  Social Connections: Not on file  Intimate Partner Violence: Not on file    SDOH:  SDOH Screenings   Alcohol Screen: Low Risk    Last Alcohol Screening Score (AUDIT): 0  Depression (PHQ2-9): Not on file  Financial Resource Strain: Not on file  Food Insecurity: Not on file  Housing: Not on file  Physical Activity: Not on file  Social  Connections: Not on file  Stress: Not on file  Tobacco Use: High Risk   Smoking Tobacco Use: Every Day   Smokeless Tobacco Use: Current   Passive Exposure: Not on file  Transportation Needs: Not on file    Last Labs:  Admission on 01/13/2021  Component Date Value Ref Range Status   Magnesium 01/12/2021 2.4  1.7 - 2.4 mg/dL Final   Performed at [facility], [address] [place], [place] [address]   Cholesterol 01/12/2021 198  0 - 200 mg/dL Final   Triglycerides 60/45/4098 281 (A)  <150 mg/dL Final   HDL 11/91/4782 76  >40 mg/dL Final   Total CHOL/HDL Ratio 01/12/2021 2.6  RATIO Final   VLDL 01/12/2021 56 (A)  0 - 40 mg/dL Final   LDL Cholesterol 01/12/2021 66  0 - 99 mg/dL Final   Comment:        Total Cholesterol/HDL:CHD Risk Coronary Heart Disease Risk Table                     Men   Women  1/2 Average Risk   3.4   3.3  Average Risk       5.0   4.4  2 X Average Risk   9.6   7.1  3 X Average Risk  23.4   11.0        Use the calculated Patient Ratio above and the CHD Risk Table to determine the patient's CHD Risk.        ATP III CLASSIFICATION (LDL):  <100     mg/dL   Optimal  956-213  mg/dL   Near or Above                    Optimal  130-159  mg/dL   Borderline  086-578  mg/dL   High  >469     mg/dL   Very High Performed at [facility], [address], [place], [place] [address]    TSH 01/12/2021 1.069  0.350 - 4.500 uIU/mL Final   Comment: Performed by a 3rd Generation assay with a functional sensitivity of <=0.01 uIU/mL. Performed at [facility], [address], [place], [place] [address]   Admission on 01/12/2021, Discharged on 01/13/2021  Component Date Value Ref Range Status  Sodium 01/12/2021 139  135 - 145 mmol/L Final   Potassium 01/12/2021 3.1 (A)  3.5 - 5.1 mmol/L Final   Chloride 01/12/2021 107  98 - 111 mmol/L Final   CO2 01/12/2021 23  22 - 32 mmol/L Final   Glucose, Bld 01/12/2021 107 (A)   70 - 99 mg/dL Final   Glucose reference range applies only to samples taken after fasting for at least 8 hours.   BUN 01/12/2021 6  6 - 20 mg/dL Final   Creatinine, Ser 01/12/2021 0.62  0.61 - 1.24 mg/dL Final   Calcium 69/62/9528 8.3 (A)  8.9 - 10.3 mg/dL Final   Total Protein 41/32/4401 6.5  6.5 - 8.1 g/dL Final   Albumin 02/72/5366 4.1  3.5 - 5.0 g/dL Final   AST 44/05/4740 29  15 - 41 U/L Final   ALT 01/12/2021 22  0 - 44 U/L Final   Alkaline Phosphatase 01/12/2021 65  38 - 126 U/L Final   Total Bilirubin 01/12/2021 0.6  0.3 - 1.2 mg/dL Final   GFR, Estimated 01/12/2021 >60  >60 mL/min Final   Comment: (NOTE) Calculated using the CKD-EPI Creatinine Equation (2021)    Anion gap 01/12/2021 9  5 - 15 Final   Performed at [facility], [address] [place], [place] [address]   Alcohol, Ethyl (B) 01/12/2021 325 (A)  <10 mg/dL Final   Comment: CRITICAL RESULT CALLED TO, READ BACK BY AND VERIFIED WITH: SONYA, RN @ 1624 ON 01/12/2021 BY LBROOKS, MLT (NOTE) Lowest detectable limit for serum alcohol is 10 mg/dL.  For medical purposes only. Performed at [facility], [address] [place], [place] [address]    Salicylate Lvl 01/12/2021 <7.0 (A)  7.0 - 30.0 mg/dL Final   Performed at [facility], [address], [place], [place] [address]   Acetaminophen (Tylenol), Serum 01/12/2021 <10 (A)  10 - 30 ug/mL Final   Comment: (NOTE) Therapeutic concentrations vary significantly. A range of 10-30 ug/mL  may be an effective concentration for many patients. However, some  are best treated at concentrations outside of this range. Acetaminophen concentrations >150 ug/mL at 4 hours after ingestion  and >50 ug/mL at 12 hours after ingestion are often associated with  toxic reactions.  Performed at [facility], [address], [place], [place] [address]    WBC 01/12/2021 9.3  4.0 - 10.5 K/uL Final   RBC  01/12/2021 4.86  4.22 - 5.81 MIL/uL Final   Hemoglobin 01/12/2021 15.3  13.0 - 17.0 g/dL Final   HCT 66/08/3014 43.6  39.0 - 52.0 % Final   MCV 01/12/2021 89.7  80.0 - 100.0 fL Final   MCH 01/12/2021 31.5  26.0 - 34.0 pg Final   MCHC 01/12/2021 35.1  30.0 - 36.0 g/dL Final   RDW 03/11/3233 13.2  11.5 - 15.5 % Final   Platelets 01/12/2021 271  150 - 400 K/uL Final   nRBC 01/12/2021 0.0  0.0 - 0.2 % Final   Performed at [facility], [address] [place], [place] [address]   Opiates 01/12/2021 NONE DETECTED  NONE DETECTED Final   Cocaine 01/12/2021 NONE DETECTED  NONE DETECTED Final   Benzodiazepines 01/12/2021 NONE DETECTED  NONE DETECTED Final   Amphetamines 01/12/2021 NONE DETECTED  NONE DETECTED Final   Tetrahydrocannabinol 01/12/2021 NONE DETECTED  NONE DETECTED Final   Barbiturates 01/12/2021 NONE DETECTED  NONE DETECTED Final   Comment: (NOTE) DRUG SCREEN FOR MEDICAL PURPOSES  ONLY.  IF CONFIRMATION IS NEEDED FOR ANY PURPOSE, NOTIFY LAB WITHIN 5 DAYS.  LOWEST DETECTABLE LIMITS FOR URINE DRUG SCREEN Drug Class                     Cutoff (ng/mL) Amphetamine and metabolites    1000 Barbiturate and metabolites    200 Benzodiazepine                 200 Tricyclics and metabolites     300 Opiates and metabolites        300 Cocaine and metabolites        300 THC                            50 Performed at [facility], [address], [place], [place] [address]    SARS Coronavirus 2 by RT PCR 01/12/2021 NEGATIVE  NEGATIVE Final   Comment: (NOTE) SARS-CoV-2 target nucleic acids are NOT DETECTED.  The SARS-CoV-2 RNA is generally detectable in upper respiratory specimens during the acute phase of infection. The lowest concentration of SARS-CoV-2 viral copies this assay can detect is 138 copies/mL. A negative result does not preclude SARS-Cov-2 infection and should not be used as the sole basis for treatment or other patient management  decisions. A negative result may occur with  improper specimen collection/handling, submission of specimen other than nasopharyngeal swab, presence of viral mutation(s) within the areas targeted by this assay, and inadequate number of viral copies(<138 copies/mL). A negative result must be combined with clinical observations, patient history, and epidemiological information. The expected result is Negative.  Fact Sheet for Patients:  BloggerCourse.com  Fact Sheet for Healthcare Providers:  SeriousBroker.it  This test is no                          t yet approved or cleared by the [place] FDA and  has been authorized for detection and/or diagnosis of SARS-CoV-2 by FDA under an Emergency Use Authorization (EUA). This EUA will remain  in effect (meaning this test can be used) for the duration of the COVID-19 declaration under Section 564(b)(1) of the Act, 21 U.S.C.section 360bbb-3(b)(1), unless the authorization is terminated  or revoked sooner.       Influenza A by PCR 01/12/2021 NEGATIVE  NEGATIVE Final   Influenza B by PCR 01/12/2021 NEGATIVE  NEGATIVE Final   Comment: (NOTE) The Xpert Xpress SARS-CoV-2/FLU/RSV plus assay is intended as an aid in the diagnosis of influenza from Nasopharyngeal swab specimens and should not be used as a sole basis for treatment. Nasal washings and aspirates are unacceptable for Xpert Xpress SARS-CoV-2/FLU/RSV testing.  Fact Sheet for Patients: BloggerCourse.com  Fact Sheet for Healthcare Providers: SeriousBroker.it  This test is not yet approved or cleared by the [place] FDA and has been authorized for detection and/or diagnosis of SARS-CoV-2 by FDA under an Emergency Use Authorization (EUA). This EUA will remain in effect (meaning this test can be used) for the duration of the COVID-19 declaration under Section 564(b)(1) of the Act,  21 U.S.C. section 360bbb-3(b)(1), unless the authorization is terminated or revoked.  Performed at [facility], [address] [place], [place] [address]   Admission on 11/14/2020, Discharged on 11/19/2020  Component Date Value Ref Range Status   RPR Ser Ql 11/14/2020 NON REACTIVE  NON REACTIVE Final   Performed at [facility],  [address], [place], [place] [address]   Hepatitis B Surface Ag 11/14/2020 NON REACTIVE  NON REACTIVE Final   HCV Ab 11/14/2020 NON REACTIVE  NON REACTIVE Final   Comment: (NOTE) Nonreactive HCV antibody screen is consistent with no HCV infections,  unless recent infection is suspected or other evidence exists to indicate HCV infection.     Hep A IgM 11/14/2020 NON REACTIVE  NON REACTIVE Final   Hep B C IgM 11/14/2020 NON REACTIVE  NON REACTIVE Final   Performed at [facility], [address] [place], [place] [address]   HIV Screen 4th Generation wRfx 11/14/2020 Non Reactive  Non Reactive Final   Performed at [facility], [address], [place], [place] [address]  Admission on 11/13/2020, Discharged on 11/14/2020  Component Date Value Ref Range Status   Sodium 11/13/2020 136  135 - 145 mmol/L Final   Potassium 11/13/2020 3.8  3.5 - 5.1 mmol/L Final   Chloride 11/13/2020 101  98 - 111 mmol/L Final   CO2 11/13/2020 24  22 - 32 mmol/L Final   Glucose, Bld 11/13/2020 102 (A)  70 - 99 mg/dL Final   Glucose reference range applies only to samples taken after fasting for at least 8 hours.   BUN 11/13/2020 5 (A)  6 - 20 mg/dL Final   Creatinine, Ser 11/13/2020 0.69  0.61 - 1.24 mg/dL Final   Calcium 91/47/8295 9.3  8.9 - 10.3 mg/dL Final   Total Protein 62/13/0865 7.3  6.5 - 8.1 g/dL Final   Albumin 78/46/9629 4.5  3.5 - 5.0 g/dL Final   AST 52/84/1324 21  15 - 41 U/L Final   ALT 11/13/2020 14  0 - 44 U/L Final   Alkaline Phosphatase 11/13/2020 97  38 - 126 U/L Final   Total Bilirubin 11/13/2020 1.3 (A)  0.3 - 1.2  mg/dL Final   GFR, Estimated 11/13/2020 >60  >60 mL/min Final   Comment: (NOTE) Calculated using the CKD-EPI Creatinine Equation (2021)    Anion gap 11/13/2020 11  5 - 15 Final   Performed at [facility], [address] [place], [place] [address]   Alcohol, Ethyl (B) 11/13/2020 <10  <10 mg/dL Final   Comment: (NOTE) Lowest detectable limit for serum alcohol is 10 mg/dL.  For medical purposes only. Performed at [facility], [address], [place], [place] [address]    WBC 11/13/2020 7.1  4.0 - 10.5 K/uL Final   RBC 11/13/2020 5.02  4.22 - 5.81 MIL/uL Final   Hemoglobin 11/13/2020 16.0  13.0 - 17.0 g/dL Final   HCT 64/40/3474 46.2  39.0 - 52.0 % Final   MCV 11/13/2020 92.0  80.0 - 100.0 fL Final   MCH 11/13/2020 31.9  26.0 - 34.0 pg Final   MCHC 11/13/2020 34.6  30.0 - 36.0 g/dL Final   RDW 25/95/6387 13.6  11.5 - 15.5 % Final   Platelets 11/13/2020 269  150 - 400 K/uL Final   nRBC 11/13/2020 0.0  0.0 - 0.2 % Final   Performed at [facility], [address], [place], [place] [address]   Opiates 11/13/2020 NONE DETECTED  NONE DETECTED Final   Cocaine 11/13/2020 NONE DETECTED  NONE DETECTED Final   Benzodiazepines 11/13/2020 NONE DETECTED  NONE DETECTED Final   Amphetamines 11/13/2020 NONE DETECTED  NONE DETECTED Final   Tetrahydrocannabinol 11/13/2020 NONE DETECTED  NONE DETECTED Final   Barbiturates 11/13/2020 NONE DETECTED  NONE DETECTED Final  Comment: (NOTE) DRUG SCREEN FOR MEDICAL PURPOSES ONLY.  IF CONFIRMATION IS NEEDED FOR ANY PURPOSE, NOTIFY LAB WITHIN 5 DAYS.  LOWEST DETECTABLE LIMITS FOR URINE DRUG SCREEN Drug Class                     Cutoff (ng/mL) Amphetamine and metabolites    1000 Barbiturate and metabolites    200 Benzodiazepine                 200 Tricyclics and metabolites     300 Opiates and metabolites        300 Cocaine and metabolites        300 THC                            50 Performed at [facility], [address], [place], [place] [address]    Acetaminophen (Tylenol), Serum 11/13/2020 <10 (A)  10 - 30 ug/mL Final   Comment: (NOTE) Therapeutic concentrations vary significantly. A range of 10-30 ug/mL  may be an effective concentration for many patients. However, some  are best treated at concentrations outside of this range. Acetaminophen concentrations >150 ug/mL at 4 hours after ingestion  and >50 ug/mL at 12 hours after ingestion are often associated with  toxic reactions.  Performed at [facility], [address] [place], [place] [address]    Salicylate Lvl 11/13/2020 <7.0 (A)  7.0 - 30.0 mg/dL Final   Performed at [facility], [address], [place], [place] [address]   SARS Coronavirus 2 by RT PCR 11/13/2020 NEGATIVE  NEGATIVE Final   Comment: (NOTE) SARS-CoV-2 target nucleic acids are NOT DETECTED.  The SARS-CoV-2 RNA is generally detectable in upper respiratory specimens during the acute phase of infection. The lowest concentration of SARS-CoV-2 viral copies this assay can detect is 138 copies/mL. A negative result does not preclude SARS-Cov-2 infection and should not be used as the sole basis for treatment or other patient management decisions. A negative result may occur with  improper specimen collection/handling, submission of specimen other than nasopharyngeal swab, presence of viral mutation(s) within the areas targeted by this assay, and inadequate number of viral copies(<138 copies/mL). A negative result must be combined with clinical observations, patient history, and epidemiological information. The expected result is Negative.  Fact Sheet for Patients:  BloggerCourse.com  Fact Sheet for Healthcare Providers:  SeriousBroker.it  This test is no                          t yet approved or cleared by the [place] FDA and  has been authorized for detection and/or diagnosis of SARS-CoV-2 by FDA under  an Emergency Use Authorization (EUA). This EUA will remain  in effect (meaning this test can be used) for the duration of the COVID-19 declaration under Section 564(b)(1) of the Act, 21 U.S.C.section 360bbb-3(b)(1), unless the authorization is terminated  or revoked sooner.       Influenza A by PCR 11/13/2020 NEGATIVE  NEGATIVE Final   Influenza B by PCR 11/13/2020 NEGATIVE  NEGATIVE Final   Comment: (NOTE) The Xpert Xpress SARS-CoV-2/FLU/RSV plus assay is intended as an aid in the diagnosis of influenza from Nasopharyngeal swab specimens and should not be used as a sole basis for treatment. Nasal washings and aspirates are unacceptable for Xpert Xpress SARS-CoV-2/FLU/RSV testing.  Fact Sheet for Patients: BloggerCourse.com  Fact Sheet for Healthcare Providers: SeriousBroker.it  This test is not yet approved or cleared by the [place] FDA and has been authorized for detection and/or diagnosis of SARS-CoV-2 by FDA under an Emergency Use Authorization (EUA). This EUA will remain in effect (meaning this test can be used) for the duration of the COVID-19 declaration under Section 564(b)(1) of the Act, 21 U.S.C. section 360bbb-3(b)(1), unless the authorization is terminated or revoked.  Performed at [facility], [address], [place], [place] [address]   Admission on 10/12/2020, Discharged on 10/16/2020  Component Date Value Ref Range Status   Valproic Acid Lvl 10/12/2020 29 (A)  50.0 - 100.0 ug/mL Final   Performed at [facility], [address], [place], [place] [address]  Admission on 10/11/2020, Discharged on 10/12/2020  Component Date Value Ref Range Status   SARS Coronavirus 2 by RT PCR 10/11/2020 NEGATIVE  NEGATIVE Final   Comment: (NOTE) SARS-CoV-2 target nucleic acids are NOT DETECTED.  The SARS-CoV-2 RNA is generally detectable in upper respiratory specimens during the acute phase of  infection. The lowest concentration of SARS-CoV-2 viral copies this assay can detect is 138 copies/mL. A negative result does not preclude SARS-Cov-2 infection and should not be used as the sole basis for treatment or other patient management decisions. A negative result may occur with  improper specimen collection/handling, submission of specimen other than nasopharyngeal swab, presence of viral mutation(s) within the areas targeted by this assay, and inadequate number of viral copies(<138 copies/mL). A negative result must be combined with clinical observations, patient history, and epidemiological information. The expected result is Negative.  Fact Sheet for Patients:  BloggerCourse.com  Fact Sheet for Healthcare Providers:  SeriousBroker.it  This test is no                          t yet approved or cleared by the [place] FDA and  has been authorized for detection and/or diagnosis of SARS-CoV-2 by FDA under an Emergency Use Authorization (EUA). This EUA will remain  in effect (meaning this test can be used) for the duration of the COVID-19 declaration under Section 564(b)(1) of the Act, 21 U.S.C.section 360bbb-3(b)(1), unless the authorization is terminated  or revoked sooner.       Influenza A by PCR 10/11/2020 NEGATIVE  NEGATIVE Final   Influenza B by PCR 10/11/2020 NEGATIVE  NEGATIVE Final   Comment: (NOTE) The Xpert Xpress SARS-CoV-2/FLU/RSV plus assay is intended as an aid in the diagnosis of influenza from Nasopharyngeal swab specimens and should not be used as a sole basis for treatment. Nasal washings and aspirates are unacceptable for Xpert Xpress SARS-CoV-2/FLU/RSV testing.  Fact Sheet for Patients: BloggerCourse.com  Fact Sheet for Healthcare Providers: SeriousBroker.it  This test is not yet approved or cleared by the [place] FDA and has been  authorized for detection and/or diagnosis of SARS-CoV-2 by FDA under an Emergency Use Authorization (EUA). This EUA will remain in effect (meaning this test can be used) for the duration of the COVID-19 declaration under Section 564(b)(1) of the Act, 21 U.S.C. section 360bbb-3(b)(1), unless the authorization is terminated or revoked.  Performed at [facility], [address] [place], [place] [address]    Sodium 10/11/2020 132 (A)  135 - 145 mmol/L Final   Potassium 10/11/2020 3.5  3.5 - 5.1 mmol/L Final   Chloride 10/11/2020 99  98 - 111 mmol/L Final   CO2 10/11/2020 23  22 - 32 mmol/L Final   Glucose, Bld 10/11/2020 134 (A)  70 - 99 mg/dL Final   Glucose reference range applies only to samples taken after fasting for at least 8 hours.   BUN 10/11/2020 11  6 - 20 mg/dL Final   Creatinine, Ser 10/11/2020 0.78  0.61 - 1.24 mg/dL Final   Calcium 08/65/7846 9.0  8.9 - 10.3 mg/dL Final   Total Protein 96/29/5284 6.9  6.5 - 8.1 g/dL Final   Albumin 13/24/4010 4.2  3.5 - 5.0 g/dL Final   AST 27/25/3664 24  15 - 41 U/L Final   ALT 10/11/2020 21  0 - 44 U/L Final   Alkaline Phosphatase 10/11/2020 77  38 - 126 U/L Final   Total Bilirubin 10/11/2020 1.1  0.3 - 1.2 mg/dL Final   GFR, Estimated 10/11/2020 >60  >60 mL/min Final   Comment: (NOTE) Calculated using the CKD-EPI Creatinine Equation (2021)    Anion gap 10/11/2020 10  5 - 15 Final   Performed at [facility], [address] [place], [place] [address]   Alcohol, Ethyl (B) 10/11/2020 <10  <10 mg/dL Final   Comment: (NOTE) Lowest detectable limit for serum alcohol is 10 mg/dL.  For medical purposes only. Performed at [facility], [address] [place], [place] [address]    Opiates 10/11/2020 NONE DETECTED  NONE DETECTED Final   Cocaine 10/11/2020 NONE DETECTED  NONE DETECTED Final   Benzodiazepines 10/11/2020 NONE DETECTED  NONE DETECTED Final   Amphetamines 10/11/2020 NONE DETECTED  NONE DETECTED Final    Tetrahydrocannabinol 10/11/2020 NONE DETECTED  NONE DETECTED Final   Barbiturates 10/11/2020 NONE DETECTED  NONE DETECTED Final   Comment: (NOTE) DRUG SCREEN FOR MEDICAL PURPOSES ONLY.  IF CONFIRMATION IS NEEDED FOR ANY PURPOSE, NOTIFY LAB WITHIN 5 DAYS.  LOWEST DETECTABLE LIMITS FOR URINE DRUG SCREEN Drug Class                     Cutoff (ng/mL) Amphetamine and metabolites    1000 Barbiturate and metabolites    200 Benzodiazepine                 200 Tricyclics and metabolites     300 Opiates and metabolites        300 Cocaine and metabolites        300 THC                            50 Performed at [facility], [address], [place], [place] [address]    WBC 10/11/2020 10.3  4.0 - 10.5 K/uL Final   RBC 10/11/2020 4.48  4.22 - 5.81 MIL/uL Final   Hemoglobin 10/11/2020 13.7  13.0 - 17.0 g/dL Final   HCT 64/33/2951 39.6  39.0 - 52.0 % Final   MCV 10/11/2020 88.4  80.0 - 100.0 fL Final   MCH 10/11/2020 30.6  26.0 - 34.0 pg Final   MCHC 10/11/2020 34.6  30.0 - 36.0 g/dL Final   RDW 88/41/6606 12.6  11.5 - 15.5 % Final   Platelets 10/11/2020 362  150 - 400 K/uL Final   nRBC 10/11/2020 0.0  0.0 - 0.2 % Final   Neutrophils Relative % 10/11/2020 73  % Final   Neutro Abs 10/11/2020 7.4  1.7 - 7.7 K/uL Final   Lymphocytes Relative 10/11/2020 15  % Final   Lymphs Abs 10/11/2020 1.5  0.7 -  4.0 K/uL Final   Monocytes Relative 10/11/2020 10  % Final   Monocytes Absolute 10/11/2020 1.1 (A)  0.1 - 1.0 K/uL Final   Eosinophils Relative 10/11/2020 2  % Final   Eosinophils Absolute 10/11/2020 0.2  0.0 - 0.5 K/uL Final   Basophils Relative 10/11/2020 0  % Final   Basophils Absolute 10/11/2020 0.0  0.0 - 0.1 K/uL Final   Immature Granulocytes 10/11/2020 0  % Final   Abs Immature Granulocytes 10/11/2020 0.04  0.00 - 0.07 K/uL Final   Performed at [facility], [address] [place], [place] [address]   Salicylate Lvl 10/11/2020 <7.0 (A)  7.0 - 30.0 mg/dL Final   Performed at  [facility], [address], [place], [place] [address]   Acetaminophen (Tylenol), Serum 10/11/2020 <10 (A)  10 - 30 ug/mL Final   Comment: (NOTE) Therapeutic concentrations vary significantly. A range of 10-30 ug/mL  may be an effective concentration for many patients. However, some  are best treated at concentrations outside of this range. Acetaminophen concentrations >150 ug/mL at 4 hours after ingestion  and >50 ug/mL at 12 hours after ingestion are often associated with  toxic reactions.  Performed at [facility], [address], [place], [place] [address]   Admission on 10/06/2020, Discharged on 10/07/2020  Component Date Value Ref Range Status   WBC 10/06/2020 7.1  4.0 - 10.5 K/uL Final   RBC 10/06/2020 4.45  4.22 - 5.81 MIL/uL Final   Hemoglobin 10/06/2020 13.7  13.0 - 17.0 g/dL Final   HCT 91/47/8295 40.3  39.0 - 52.0 % Final   MCV 10/06/2020 90.6  80.0 - 100.0 fL Final   MCH 10/06/2020 30.8  26.0 - 34.0 pg Final   MCHC 10/06/2020 34.0  30.0 - 36.0 g/dL Final   RDW 62/13/0865 12.7  11.5 - 15.5 % Final   Platelets 10/06/2020 286  150 - 400 K/uL Final   nRBC 10/06/2020 0.0  0.0 - 0.2 % Final   Performed at [facility], [address] [place], [place] [address]   Salicylate Lvl 10/06/2020 <7.0 (A)  7.0 - 30.0 mg/dL Final   Performed at [facility], [address] [place], [place] [address]   Acetaminophen (Tylenol), Serum 10/06/2020 <10 (A)  10 - 30 ug/mL Final   Comment: (NOTE) Therapeutic concentrations vary significantly. A range of 10-30 ug/mL  may be an effective concentration for many patients. However, some  are best treated at concentrations outside of this range. Acetaminophen concentrations >150 ug/mL at 4 hours after ingestion  and >50 ug/mL at 12 hours after ingestion are often associated with  toxic reactions.  Performed at [facility], [address] [place], [place] [address]     Sodium 10/06/2020 137  135 - 145 mmol/L Final   Potassium 10/06/2020 3.1 (A)  3.5 - 5.1 mmol/L Final   Chloride 10/06/2020 100  98 - 111 mmol/L Final   CO2 10/06/2020 25  22 - 32 mmol/L Final   Glucose, Bld 10/06/2020 104 (A)  70 - 99 mg/dL Final   Glucose reference range applies only to samples taken after fasting for at least 8 hours.   BUN 10/06/2020 12  6 - 20 mg/dL Final   Creatinine, Ser 10/06/2020 0.62  0.61 - 1.24 mg/dL Final   Calcium 44/03/270 8.9  8.9 - 10.3 mg/dL Final   Total Protein 53/66/4403 7.5  6.5 - 8.1 g/dL Final  Albumin 10/06/2020 4.9  3.5 - 5.0 g/dL Final   AST 16/12/9602 26  15 - 41 U/L Final   ALT 10/06/2020 31  0 - 44 U/L Final   Alkaline Phosphatase 10/06/2020 60  38 - 126 U/L Final   Total Bilirubin 10/06/2020 1.3 (A)  0.3 - 1.2 mg/dL Final   GFR, Estimated 10/06/2020 >60  >60 mL/min Final   Comment: (NOTE) Calculated using the CKD-EPI Creatinine Equation (2021)    Anion gap 10/06/2020 12  5 - 15 Final   Performed at [facility], [address], [place], [place] [address]   Alcohol, Ethyl (B) 10/06/2020 <10  <10 mg/dL Final   Comment: (NOTE) Lowest detectable limit for serum alcohol is 10 mg/dL.  For medical purposes only. Performed at [facility], [address], [place], [place] [address]    SARS Coronavirus 2 by RT PCR 10/06/2020 NEGATIVE  NEGATIVE Final   Comment: (NOTE) SARS-CoV-2 target nucleic acids are NOT DETECTED.  The SARS-CoV-2 RNA is generally detectable in upper respiratory specimens during the acute phase of infection. The lowest concentration of SARS-CoV-2 viral copies this assay can detect is 138 copies/mL. A negative result does not preclude SARS-Cov-2 infection and should not be used as the sole basis for treatment or other patient management decisions. A negative result may occur with  improper specimen collection/handling, submission of specimen other than nasopharyngeal swab, presence  of viral mutation(s) within the areas targeted by this assay, and inadequate number of viral copies(<138 copies/mL). A negative result must be combined with clinical observations, patient history, and epidemiological information. The expected result is Negative.  Fact Sheet for Patients:  BloggerCourse.com  Fact Sheet for Healthcare Providers:  SeriousBroker.it  This test is no                          t yet approved or cleared by the [place] FDA and  has been authorized for detection and/or diagnosis of SARS-CoV-2 by FDA under an Emergency Use Authorization (EUA). This EUA will remain  in effect (meaning this test can be used) for the duration of the COVID-19 declaration under Section 564(b)(1) of the Act, 21 U.S.C.section 360bbb-3(b)(1), unless the authorization is terminated  or revoked sooner.       Influenza A by PCR 10/06/2020 NEGATIVE  NEGATIVE Final   Influenza B by PCR 10/06/2020 NEGATIVE  NEGATIVE Final   Comment: (NOTE) The Xpert Xpress SARS-CoV-2/FLU/RSV plus assay is intended as an aid in the diagnosis of influenza from Nasopharyngeal swab specimens and should not be used as a sole basis for treatment. Nasal washings and aspirates are unacceptable for Xpert Xpress SARS-CoV-2/FLU/RSV testing.  Fact Sheet for Patients: BloggerCourse.com  Fact Sheet for Healthcare Providers: SeriousBroker.it  This test is not yet approved or cleared by the [place] FDA and has been authorized for detection and/or diagnosis of SARS-CoV-2 by FDA under an Emergency Use Authorization (EUA). This EUA will remain in effect (meaning this test can be used) for the duration of the COVID-19 declaration under Section 564(b)(1) of the Act, 21 U.S.C. section 360bbb-3(b)(1), unless the authorization is terminated or revoked.  Performed at [facility], [address] [place], [place] [address]    Opiates 10/07/2020 NONE DETECTED  NONE DETECTED Final   Cocaine 10/07/2020 NONE DETECTED  NONE DETECTED Final   Benzodiazepines 10/07/2020 NONE DETECTED  NONE DETECTED Final   Amphetamines 10/07/2020 POSITIVE (A)  NONE  DETECTED Final   Tetrahydrocannabinol 10/07/2020 NONE DETECTED  NONE DETECTED Final   Barbiturates 10/07/2020 NONE DETECTED  NONE DETECTED Final   Comment: (NOTE) DRUG SCREEN FOR MEDICAL PURPOSES ONLY.  IF CONFIRMATION IS NEEDED FOR ANY PURPOSE, NOTIFY LAB WITHIN 5 DAYS.  LOWEST DETECTABLE LIMITS FOR URINE DRUG SCREEN Drug Class                     Cutoff (ng/mL) Amphetamine and metabolites    1000 Barbiturate and metabolites    200 Benzodiazepine                 200 Tricyclics and metabolites     300 Opiates and metabolites        300 Cocaine and metabolites        300 THC                            50 Performed at [facility], [address], [place], [place] [address]   Admission on 09/25/2020, Discharged on 10/01/2020  Component Date Value Ref Range Status   Cholesterol 09/27/2020 161  0 - 200 mg/dL Final   Triglycerides 47/82/9562 156 (A)  <150 mg/dL Final   HDL 13/10/6576 52  >40 mg/dL Final   Total CHOL/HDL Ratio 09/27/2020 3.1  RATIO Final   VLDL 09/27/2020 31  0 - 40 mg/dL Final   LDL Cholesterol 09/27/2020 78  0 - 99 mg/dL Final   Comment:        Total Cholesterol/HDL:CHD Risk Coronary Heart Disease Risk Table                     Men   Women  1/2 Average Risk   3.4   3.3  Average Risk       5.0   4.4  2 X Average Risk   9.6   7.1  3 X Average Risk  23.4   11.0        Use the calculated Patient Ratio above and the CHD Risk Table to determine the patient's CHD Risk.        ATP III CLASSIFICATION (LDL):  <100     mg/dL   Optimal  469-629  mg/dL   Near or Above                    Optimal  130-159  mg/dL   Borderline  528-413  mg/dL   High  >244     mg/dL   Very High Performed at  [facility], [address] [place], [place] [address]    Hgb A1c MFr Bld 09/27/2020 5.2  4.8 - 5.6 % Final   Comment: (NOTE)         Prediabetes: 5.7 - 6.4         Diabetes: >6.4         Glycemic control for adults with diabetes: <7.0    Mean Plasma Glucose 09/27/2020 103  mg/dL Final   Comment: (NOTE) Performed At: [facility] [address] [place], [place] [id number] Jolene Schimke MD KV:[id number]    WBC 10/01/2020 8.8  4.0 - 10.5 K/uL Final   RBC 10/01/2020 4.64  4.22 - 5.81 MIL/uL Final   Hemoglobin 10/01/2020 14.2  13.0 - 17.0 g/dL Final   HCT 64/33/2951 42.5  39.0 - 52.0 % Final   MCV 10/01/2020 91.6  80.0 - 100.0 fL Final  MCH 10/01/2020 30.6  26.0 - 34.0 pg Final   MCHC 10/01/2020 33.4  30.0 - 36.0 g/dL Final   RDW 16/12/9602 12.9  11.5 - 15.5 % Final   Platelets 10/01/2020 271  150 - 400 K/uL Final   nRBC 10/01/2020 0.0  0.0 - 0.2 % Final   Neutrophils Relative % 10/01/2020 56  % Final   Neutro Abs 10/01/2020 4.9  1.7 - 7.7 K/uL Final   Lymphocytes Relative 10/01/2020 24  % Final   Lymphs Abs 10/01/2020 2.1  0.7 - 4.0 K/uL Final   Monocytes Relative 10/01/2020 9  % Final   Monocytes Absolute 10/01/2020 0.8  0.1 - 1.0 K/uL Final   Eosinophils Relative 10/01/2020 6  % Final   Eosinophils Absolute 10/01/2020 0.5  0.0 - 0.5 K/uL Final   Basophils Relative 10/01/2020 1  % Final   Basophils Absolute 10/01/2020 0.1  0.0 - 0.1 K/uL Final   Immature Granulocytes 10/01/2020 4  % Final   Abs Immature Granulocytes 10/01/2020 0.33 (A)  0.00 - 0.07 K/uL Final   Performed at [facility], [address] [place], [place] [address]   Sodium 10/01/2020 140  135 - 145 mmol/L Final   Potassium 10/01/2020 4.0  3.5 - 5.1 mmol/L Final   Chloride 10/01/2020 105  98 - 111 mmol/L Final   CO2 10/01/2020 22  22 - 32 mmol/L Final   Glucose, Bld 10/01/2020 90  70 - 99 mg/dL Final   Glucose reference range applies only to samples taken after  fasting for at least 8 hours.   BUN 10/01/2020 16  6 - 20 mg/dL Final   Creatinine, Ser 10/01/2020 0.67  0.61 - 1.24 mg/dL Final   Calcium 11/91/4782 9.2  8.9 - 10.3 mg/dL Final   Total Protein 95/62/1308 6.9  6.5 - 8.1 g/dL Final   Albumin 65/78/4696 4.4  3.5 - 5.0 g/dL Final   AST 29/52/8413 32  15 - 41 U/L Final   ALT 10/01/2020 31  0 - 44 U/L Final   Alkaline Phosphatase 10/01/2020 51  38 - 126 U/L Final   Total Bilirubin 10/01/2020 0.5  0.3 - 1.2 mg/dL Final   GFR, Estimated 10/01/2020 >60  >60 mL/min Final   Comment: (NOTE) Calculated using the CKD-EPI Creatinine Equation (2021)    Anion gap 10/01/2020 13  5 - 15 Final   Performed at [facility], [address] [place], [place] [address]   Valproic Acid Lvl 10/01/2020 66  50.0 - 100.0 ug/mL Final   Performed at [facility], [address], [place], [place] [address]  Admission on 09/21/2020, Discharged on 09/25/2020  Component Date Value Ref Range Status   Sodium 09/21/2020 139  135 - 145 mmol/L Final   Potassium 09/21/2020 4.0  3.5 - 5.1 mmol/L Final   Chloride 09/21/2020 103  98 - 111 mmol/L Final   CO2 09/21/2020 25  22 - 32 mmol/L Final   Glucose, Bld 09/21/2020 111 (A)  70 - 99 mg/dL Final   Glucose reference range applies only to samples taken after fasting for at least 8 hours.   BUN 09/21/2020 15  6 - 20 mg/dL Final   Creatinine, Ser 09/21/2020 0.95  0.61 - 1.24 mg/dL Final   Calcium 36/64/4034 9.4  8.9 - 10.3 mg/dL Final   Total Protein 74/25/9563 6.8  6.5 - 8.1 g/dL Final   Albumin 87/56/4332 4.4  3.5 - 5.0 g/dL Final   AST 95/18/8416 15  15 - 41 U/L Final   ALT 09/21/2020 13  0 - 44 U/L Final   Alkaline Phosphatase 09/21/2020 62  38 - 126 U/L Final   Total Bilirubin 09/21/2020 0.6  0.3 - 1.2 mg/dL Final   GFR, Estimated 09/21/2020 >60  >60 mL/min Final   Comment: (NOTE) Calculated using the CKD-EPI Creatinine Equation (2021)    Anion gap 09/21/2020 11  5 - 15 Final    Performed at [facility], [address] [place], [place] [address]   Alcohol, Ethyl (B) 09/21/2020 <10  <10 mg/dL Final   Comment: (NOTE) Lowest detectable limit for serum alcohol is 10 mg/dL.  For medical purposes only. Performed at [facility], [address] [place], [place] [address]    Salicylate Lvl 09/21/2020 <7.0 (A)  7.0 - 30.0 mg/dL Final   Performed at [facility], [address] [place], [place] [address]   Acetaminophen (Tylenol), Serum 09/21/2020 <10 (A)  10 - 30 ug/mL Final   Comment: (NOTE) Therapeutic concentrations vary significantly. A range of 10-30 ug/mL  may be an effective concentration for many patients. However, some  are best treated at concentrations outside of this range. Acetaminophen concentrations >150 ug/mL at 4 hours after ingestion  and >50 ug/mL at 12 hours after ingestion are often associated with  toxic reactions.  Performed at [facility], [address], [place], [place] [address]    WBC 09/21/2020 10.5  4.0 - 10.5 K/uL Final   RBC 09/21/2020 4.58  4.22 - 5.81 MIL/uL Final   Hemoglobin 09/21/2020 13.9  13.0 - 17.0 g/dL Final   HCT 29/56/2130 40.0  39.0 - 52.0 % Final   MCV 09/21/2020 87.3  80.0 - 100.0 fL Final   MCH 09/21/2020 30.3  26.0 - 34.0 pg Final   MCHC 09/21/2020 34.8  30.0 - 36.0 g/dL Final   RDW 86/57/8469 12.3  11.5 - 15.5 % Final   Platelets 09/21/2020 292  150 - 400 K/uL Final   nRBC 09/21/2020 0.0  0.0 - 0.2 % Final   Performed at [facility], [address], [place], [place] [address]   Opiates 09/21/2020 NONE DETECTED  NONE DETECTED Final   Cocaine 09/21/2020 NONE DETECTED  NONE DETECTED Final   Benzodiazepines 09/21/2020 NONE DETECTED  NONE DETECTED Final   Amphetamines 09/21/2020 NONE DETECTED  NONE DETECTED Final   Tetrahydrocannabinol 09/21/2020 NONE DETECTED  NONE DETECTED Final   Barbiturates 09/21/2020 NONE DETECTED  NONE DETECTED Final   Comment: (NOTE) DRUG SCREEN FOR  MEDICAL PURPOSES ONLY.  IF CONFIRMATION IS NEEDED FOR ANY PURPOSE, NOTIFY LAB WITHIN 5 DAYS.  LOWEST DETECTABLE LIMITS FOR URINE DRUG SCREEN Drug Class                     Cutoff (ng/mL) Amphetamine and metabolites    1000 Barbiturate and metabolites    200 Benzodiazepine                 200 Tricyclics and metabolites     300 Opiates and metabolites        300 Cocaine and metabolites        300 THC                            50 Performed at [facility], [address] [place], [place] [address]    SARS Coronavirus 2 by  RT PCR 09/21/2020 NEGATIVE  NEGATIVE Final   Comment: (NOTE) SARS-CoV-2 target nucleic acids are NOT DETECTED.  The SARS-CoV-2 RNA is generally detectable in upper respiratory specimens during the acute phase of infection. The lowest concentration of SARS-CoV-2 viral copies this assay can detect is 138 copies/mL. A negative result does not preclude SARS-Cov-2 infection and should not be used as the sole basis for treatment or other patient management decisions. A negative result may occur with  improper specimen collection/handling, submission of specimen other than nasopharyngeal swab, presence of viral mutation(s) within the areas targeted by this assay, and inadequate number of viral copies(<138 copies/mL). A negative result must be combined with clinical observations, patient history, and epidemiological information. The expected result is Negative.  Fact Sheet for Patients:  BloggerCourse.com  Fact Sheet for Healthcare Providers:  SeriousBroker.it  This test is no                          t yet approved or cleared by the [place] FDA and  has been authorized for detection and/or diagnosis of SARS-CoV-2 by FDA under an Emergency Use Authorization (EUA). This EUA will remain  in effect (meaning this test can be used) for the duration of the COVID-19 declaration under Section 564(b)(1) of the  Act, 21 U.S.C.section 360bbb-3(b)(1), unless the authorization is terminated  or revoked sooner.       Influenza A by PCR 09/21/2020 NEGATIVE  NEGATIVE Final   Influenza B by PCR 09/21/2020 NEGATIVE  NEGATIVE Final   Comment: (NOTE) The Xpert Xpress SARS-CoV-2/FLU/RSV plus assay is intended as an aid in the diagnosis of influenza from Nasopharyngeal swab specimens and should not be used as a sole basis for treatment. Nasal washings and aspirates are unacceptable for Xpert Xpress SARS-CoV-2/FLU/RSV testing.  Fact Sheet for Patients: BloggerCourse.com  Fact Sheet for Healthcare Providers: SeriousBroker.it  This test is not yet approved or cleared by the [place] FDA and has been authorized for detection and/or diagnosis of SARS-CoV-2 by FDA under an Emergency Use Authorization (EUA). This EUA will remain in effect (meaning this test can be used) for the duration of the COVID-19 declaration under Section 564(b)(1) of the Act, 21 U.S.C. section 360bbb-3(b)(1), unless the authorization is terminated or revoked.  Performed at [facility], [address], [place], [place] [address]    Color, Urine 09/21/2020 YELLOW  YELLOW Final   APPearance 09/21/2020 CLEAR  CLEAR Final   Specific Gravity, Urine 09/21/2020 1.014  1.005 - 1.030 Final   pH 09/21/2020 7.0  5.0 - 8.0 Final   Glucose, UA 09/21/2020 NEGATIVE  NEGATIVE mg/dL Final   Hgb urine dipstick 09/21/2020 NEGATIVE  NEGATIVE Final   Bilirubin Urine 09/21/2020 NEGATIVE  NEGATIVE Final   Ketones, ur 09/21/2020 NEGATIVE  NEGATIVE mg/dL Final   Protein, ur 19/14/7829 NEGATIVE  NEGATIVE mg/dL Final   Nitrite 56/21/3086 NEGATIVE  NEGATIVE Final   Leukocytes,Ua 09/21/2020 NEGATIVE  NEGATIVE Final   Performed at [facility], [address], [place], [place] [address]   Lithium Lvl 09/23/2020 0.39 (A)  0.60 - 1.20 mmol/L Final   Performed at [facility], [address] [place], [place] [address]   Sodium 09/23/2020 140  135 - 145 mmol/L Final   Potassium 09/23/2020 3.9  3.5 - 5.1 mmol/L Final   Chloride 09/23/2020 105  98 - 111 mmol/L Final   BUN 09/23/2020 14  6 - 20 mg/dL Final  Creatinine, Ser 09/23/2020 0.80  0.61 - 1.24 mg/dL Final   Glucose, Bld 16/12/9602 96  70 - 99 mg/dL Final   Glucose reference range applies only to samples taken after fasting for at least 8 hours.   Calcium, Ion 09/23/2020 1.22  1.15 - 1.40 mmol/L Final   TCO2 09/23/2020 26  22 - 32 mmol/L Final   Hemoglobin 09/23/2020 14.3  13.0 - 17.0 g/dL Final   HCT 54/11/8117 42.0  39.0 - 52.0 % Final   Sodium 09/23/2020 138  135 - 145 mmol/L Final   Potassium 09/23/2020 4.1  3.5 - 5.1 mmol/L Final   Chloride 09/23/2020 105  98 - 111 mmol/L Final   CO2 09/23/2020 25  22 - 32 mmol/L Final   Glucose, Bld 09/23/2020 99  70 - 99 mg/dL Final   Glucose reference range applies only to samples taken after fasting for at least 8 hours.   BUN 09/23/2020 13  6 - 20 mg/dL Final   Creatinine, Ser 09/23/2020 0.91  0.61 - 1.24 mg/dL Final   Calcium 14/78/2956 9.2  8.9 - 10.3 mg/dL Final   Total Protein 21/30/8657 6.2 (A)  6.5 - 8.1 g/dL Final   Albumin 84/69/6295 4.0  3.5 - 5.0 g/dL Final   AST 28/41/3244 13 (A)  15 - 41 U/L Final   ALT 09/23/2020 11  0 - 44 U/L Final   Alkaline Phosphatase 09/23/2020 49  38 - 126 U/L Final   Total Bilirubin 09/23/2020 0.7  0.3 - 1.2 mg/dL Final   GFR, Estimated 09/23/2020 >60  >60 mL/min Final   Comment: (NOTE) Calculated using the CKD-EPI Creatinine Equation (2021)    Anion gap 09/23/2020 8  5 - 15 Final   Performed at [facility], [address] [place], [place] [address]   WBC 09/23/2020 9.6  4.0 - 10.5 K/uL Final   RBC 09/23/2020 4.84  4.22 - 5.81 MIL/uL Final   Hemoglobin 09/23/2020 14.8  13.0 - 17.0 g/dL Final   HCT 25/36/6440 43.4  39.0 - 52.0 % Final   MCV 09/23/2020 89.7  80.0 - 100.0 fL Final   MCH 09/23/2020 30.6  26.0 - 34.0 pg  Final   MCHC 09/23/2020 34.1  30.0 - 36.0 g/dL Final   RDW 34/74/2595 12.4  11.5 - 15.5 % Final   Platelets 09/23/2020 291  150 - 400 K/uL Final   nRBC 09/23/2020 0.0  0.0 - 0.2 % Final   Performed at [facility], [address], [place], [place] [address]   Glucose-Capillary 09/23/2020 100 (A)  70 - 99 mg/dL Final   Glucose reference range applies only to samples taken after fasting for at least 8 hours.   Comment 1 09/23/2020 Notify RN   Final   Troponin I (High Sensitivity) 09/23/2020 3  <18 ng/L Final   Comment: (NOTE) Elevated high sensitivity troponin I (hsTnI) values and significant  changes across serial measurements may suggest ACS but many other  chronic and acute conditions are known to elevate hsTnI results.  Refer to the "Links" section for chest pain algorithms and additional  guidance. Performed at [facility], [address], [place], [place] [address]    Magnesium 09/23/2020 2.3  1.7 - 2.4 mg/dL Final   Performed at [facility], [address] [place], [place] [address]   Phosphorus 09/23/2020 3.5  2.5 - 4.6 mg/dL Final   Performed at [facility], [address], [place],  [place] [address]   TSH 09/23/2020 1.957  0.350 - 4.500 uIU/mL Final   Comment: Performed by a 3rd Generation assay with a functional sensitivity of <=0.01 uIU/mL. Performed at [facility], [address] [place], [place] [address]    T3, Free 09/23/2020 3.3  2.0 - 4.4 pg/mL Final   Comment: (NOTE) Performed At: [facility] [address] [place], [place] [id number] Jolene Schimke MD PQ:[id number]    SARS Coronavirus 2 09/24/2020 NEGATIVE  NEGATIVE Final   Comment: (NOTE) SARS-CoV-2 target nucleic acids are NOT DETECTED.  The SARS-CoV-2 RNA is generally detectable in upper and lower respiratory specimens during the acute phase of infection. Negative results do not preclude SARS-CoV-2 infection, do not rule out co-infections with other pathogens,  and should not be used as the sole basis for treatment or other patient management decisions. Negative results must be combined with clinical observations, patient history, and epidemiological information. The expected result is Negative.  Fact Sheet for Patients: HairSlick.no  Fact Sheet for Healthcare Providers: quierodirigir.com  This test is not yet approved or cleared by the [place] FDA and  has been authorized for detection and/or diagnosis of SARS-CoV-2 by FDA under an Emergency Use Authorization (EUA). This EUA will remain  in effect (meaning this test can be used) for the duration of the COVID-19 declaration under Se                          ction 564(b)(1) of the Act, 21 U.S.C. section 360bbb-3(b)(1), unless the authorization is terminated or revoked sooner.  Performed at [facility], [address], [place], [place] [address]     Allergies: Erythromycin  PTA Medications: (Not in a hospital admission)   Medical Decision Making  Initiate CIWA Protocol  Restart home meds  prozac-20mg  hydroxyzine 25mg  tid trazodone 50mg  qhs olanzapine 10mg  qhs   Start potassium chloride daily-low potassium 3.1   folic acid 1mg  daily thiamine 100mg  daily Multivitamin-1 tab nicotine patches -21 mg transdermal patch  Prn meds Zofran-odt 4mg  q 6 Immodium 2-4mg  q 6 Milk of mag -30 mL Albuterol inhaler-   Tylenol-653m q6                             Ativan 1 mg per ciwa protocol      Recommendations  Based on my evaluation the patient does not appear to have an emergency medical condition. Patient will be admitted to [facility] for crisis management, safety and stabilization.   , NP 01/14/21  1:51 AM

## 2021-01-13 NOTE — Progress Notes (Signed)
Patient presently sleeping comfortably in bed without distress or complaint.  Will continue to monitor and provide safe supportive environment.

## 2021-01-13 NOTE — ED Notes (Signed)
Pt admitted to [facility] due to SI and substance use. Pt A&O x4, calm and cooperative. Pt reports SI with plan to overdose. Pt is able to verbally contract for safety. Pt denies HI/AVH. Pt tolerated admission process and skin assessment well. Pt ambulated independently to unit. Oriented to unit/staff. Pt declined offer for food; water given. Pt requested medication for nausea, PRN Zofran given. CIWA score 1. No signs of acute distress noted. Will continue to monitor for safety.

## 2021-01-13 NOTE — ED Notes (Signed)
Pt resting in bed. A&O x4, calm and cooperative. Pt denies current SI/HI/AVH. Pt denies any current needs. No signs of acute distress noted. Will continue to monitor for safety.  

## 2021-01-13 NOTE — BH Assessment (Addendum)
Comprehensive Clinical Assessment (CCA) Note  01/13/2021 Brett House [id number]  DISPOSITION: Gave clinical report to Karel Jarvis, PA who determined Pt meets criteria for Facility Based Crisis af [facility]. Notified Theda Belfast, AC and Engineer, water at [facility]. Notified Dr. Mancel Bale and Junior Altamese Dilling, RN of acceptance via secure message.  The patient demonstrates the following risk factors for suicide: Chronic risk factors for suicide include: psychiatric disorder of bipolar disorder and previous suicide attempts by overdose, walking a in front of a car, and hanging himself . Acute risk factors for suicide include: family or marital conflict, unemployment, social withdrawal/isolation, and loss (financial, interpersonal, professional). Protective factors for this patient include: positive social support. Considering these factors, the overall suicide risk at this point appears to be high. Patient is not appropriate for outpatient follow up.  Flowsheet Row ED from 01/12/2021 in [facility] from 11/14/2020 in [facility] ED from 11/13/2020 in [facility] EM[facility]GENCY DEPARTMENT  C-SSRS RISK CATEGORY High Risk High Risk High Risk      Pt is a 35 year old single male who presents unaccompanied to [facility] ED after being found unconscious lying on a sidewalk. Pt reports he drank two 40-ounce beers and intentionally ingested heroin and Fentanyl in a suicide attempt. Pt's medical record indicates a history of bipolar disorder and Pt reports feeling increasingly depressed for the past week. Pt acknowledges symptoms including crying spells, social withdrawal, loss of interest in usual pleasures, fatigue, irritability, decreased concentration, decreased sleep, decreased appetite and feelings of guilt, worthlessness and hopelessness. He says he has attempted suicide in the past by overdosing on medications, jumping in  front of a car, and attempting to hang himself. He denies current homicidal ideation or history of aggression. He has a history of experiencing auditory and visual hallucinations but denies any recent psychotic symptoms.  Pt reports drinking approximately 12 cans of beer daily. He says he started using marijuana this week after not using it for months. He states he has a history of using methamphetamines and cocaine but denies use for approximately two months. Pt's blood alcohol level on arrival was 325 and urine drugs screen was negative.  Pt says his life is not going well and he does not foresee his situation improving. He says his family is not speaking to him. He says he lost his job two months ago. He has a history of chronic homelessness and says he is "in between places." He identifies one friend who is supportive. He denies history of abuse. He says he is currently on probation for trespassing. He denies access to firearms.  Pt says he has no outpatient mental health providers. He says he is taking Zyprexa, Prozac, hydroxyzine and Trazodone but does not believe these medications are helping. He says he was inpatient at [facility] two months ago and they prescribed these medications. He has a history of several psychiatric hospitalizations in the past and was at [facility] in July and August. He has also been to facilities in [place], [place], and [facility].  Pt is covered by a blanket, alert and oriented x4. Pt speaks in a clear tone, at moderate volume and normal pace. Motor behavior appears normal. Eye contact is good. Pt's mood is depressed and affect is congruent with mood. Thought process is coherent and relevant. There is no indication Pt is currently responding to internal stimuli or experiencing delusional thought content. Pt was cooperative throughout assessment. He  says he is willing to sign voluntarily into a psychiatric facility.  . Chief Complaint:  Chief Complaint   Patient presents with   Drug Overdose   Suicide Attempt   Visit Diagnosis:  F31.4 Bipolar I disorder, Current or most recent episode depressed, Severe  CCA Screening, Triage and Referral (STR)  Patient Reported Information How did you hear about [place]? Self  Referral name: No data recorded Referral phone number: No data recorded  Whom do you see for routine medical problems? I don't have a doctor  Practice/Facility Name: No data recorded Practice/Facility Phone Number: No data recorded Name of Contact: No data recorded Contact Number: No data recorded Contact Fax Number: No data recorded Prescriber Name: No data recorded Prescriber Address (if known): No data recorded  What Is the Reason for Your Visit/Call Today? Pt reports he used an unknown amount of heroin and Fentanyl in a suicide attempt. He states he has felt depressed for the past week. He has a history of depression and says he takes psychiatric medications but does not feel they are working. He describes feeling hopeless.  How Long Has This Been Causing You Problems? 1-6 months  What Do You Feel Would Help You the Most Today? Alcohol or Drug Use Treatment; Treatment for Depression or other mood problem; Medication(s)   Have You Recently Been in Any Inpatient Treatment (Hospital/Detox/Crisis Center/28-Day Program)? No  Name/Location of Program/Hospital:No data recorded How Long Were You There? No data recorded When Were You Discharged? No data recorded  Have You Ever Received Services From [facility] Before? No  Who Do You See at [facility]? No data recorded  Have You Recently Had Any Thoughts About Hurting Yourself? Yes  Are You Planning to Commit Suicide/Harm Yourself At This time? Yes   Have you Recently Had Thoughts About Hurting Someone [name]? No  Explanation: Pt reports thoughts of harming unknown people who he believes are trying to harm him   Have You Used Any Alcohol or Drugs in the Past 24 Hours?  Yes  How Long Ago Did You Use Drugs or Alcohol? No data recorded What Did You Use and How Much? Unknown amount of heroin, Fentanyl and alcohol   Do You Currently Have a Therapist/Psychiatrist? No  Name of Therapist/Psychiatrist: No data recorded  Have You Been Recently Discharged From Any Office Practice or Programs? Yes  Explanation of Discharge From Practice/Program: Pt reports he was inpatient at [facility] a couple of months ago     CCA Screening Triage Referral Assessment Type of Contact: Tele-Assessment  Is this Initial or Reassessment? Initial Assessment  Date Telepsych consult ordered in [facility]:  01/12/21  Time Telepsych consult ordered in [facility]:  1702   Patient Reported Information Reviewed? Yes  Patient Left Without Being Seen? No data recorded Reason for Not Completing Assessment: No data recorded  Collateral Involvement: Medical record   Does Patient Have a Court Appointed Legal Guardian? No data recorded Name and Contact of Legal Guardian: No data recorded If Minor and Not Living with Parent(s), Who has Custody? NA  Is CPS involved or ever been involved? Never  Is APS involved or ever been involved? Never   Patient Determined To Be At Risk for Harm To Self or Others Based on Review of Patient Reported Information or Presenting Complaint? Yes, for Self-Harm  Method: Plan without intent  Availability of Means: Has close by  Intent: Vague intent or NA (pt has homicidal ideation towards people he doesn't know)  Notification Required: No  need or identified person  Additional Information for Danger to Others Potential: Active psychosis  Additional Comments for Danger to Others Potential: NA  Are There Guns or Other Weapons in Your Home? No (pt has friend with a firearm--pt wants to borrow it to kill himself)  Types of Guns/Weapons: No data recorded Are These Weapons Safely Secured?                            No data recorded Who Could Verify You Are Able To  Have These Secured: No data recorded Do You Have any Outstanding Charges, Pending Court Dates, Parole/Probation? none  Contacted To Inform of Risk of Harm To Self or Others: Unable to Contact:   Location of Assessment: [facility] ED   Does Patient Present under Involuntary Commitment? No  IVC Papers Initial File Date: No data recorded  [place] of Residence: [place]   Patient Currently Receiving the Following Services: Not Receiving Services   Determination of Need: Emergent (2 hours)   Options For Referral: Inpatient Hospitalization     CCA Biopsychosocial Intake/Chief Complaint:  Patient presents due to worsening depression and SI with a current plan, intent and past history of attempts.  Current Symptoms/Problems: Patient feels hopeless and "ready to be done with life" after losing so much following a break up, to include his job.   Patient Reported Schizophrenia/Schizoaffective Diagnosis in Past: No   Strengths: Motivated for treatment  Preferences: whatever treatment is recommended  Abilities: No data recorded  Type of Services Patient Feels are Needed: No data recorded  Initial Clinical Notes/Concerns: No data recorded  Mental Health Symptoms Depression:   Change in energy/activity; Difficulty Concentrating; Fatigue; Hopelessness; Increase/decrease in appetite; Irritability; Sleep (too much or little); Worthlessness   Duration of Depressive symptoms:  Greater than two weeks   Mania:   Change in energy/activity; Irritability; Racing thoughts   Anxiety:    Tension; Worrying; Difficulty concentrating; Sleep; Fatigue   Psychosis:   None   Duration of Psychotic symptoms:  N/A   Trauma:   None   Obsessions:   None   Compulsions:   None   Inattention:   None   Hyperactivity/Impulsivity:   N/A   Oppositional/Defiant Behaviors:   N/A   Emotional Irregularity:   Mood lability   Other Mood/Personality Symptoms:   None    Mental Status  Exam Appearance and self-care  Stature:   Average   Weight:   Average weight   Clothing:   -- (Covered by blanket)   Grooming:   Normal   Cosmetic use:   None   Posture/gait:   Normal   Motor activity:   Not Remarkable   Sensorium  Attention:   Normal   Concentration:   Normal   Orientation:   X5   Recall/memory:   Defective in Recent   Affect and Mood  Affect:   Anxious; Depressed   Mood:   Anxious; Depressed; Hopeless; Worthless   Relating  Eye contact:   Fleeting   Facial expression:   Anxious; Depressed   Attitude toward examiner:   Cooperative   Thought and Language  Speech flow:  Normal   Thought content:   Appropriate to Mood and Circumstances   Preoccupation:   None   Hallucinations:   None   Organization:  No data recorded  [employer] of Knowledge:   Average   Intelligence:   Average   Abstraction:   Normal  Judgement:   Impaired   Reality Testing:   Adequate   Insight:   Poor   Decision Making:   Impulsive   Social Functioning  Social Maturity:   Isolates   Social Judgement:   "Street Smart"   Stress  Stressors:   Relationship; Work; Family conflict   Coping Ability:   Deficient supports; Overwhelmed   Skill Deficits:   Interpersonal; Self-control   Supports:   Support needed     Religion: Religion/Spirituality Are You A Religious Person?: Yes What is Your Religious Affiliation?: Baptist How Might This Affect Treatment?: NA  Leisure/Recreation: Leisure / Recreation Leisure and Hobbies: "fishing, be outside and camping"  Exercise/Diet: Exercise/Diet Do You Exercise?: No Have You Gained or Lost A Significant Amount of Weight in the Past Six Months?: No Do You Follow a Special Diet?: No Do You Have Any Trouble Sleeping?: Yes Explanation of Sleeping Difficulties: Pt reports poor sleep   CCA Employment/Education Employment/Work Situation: Employment / Work  Situation Employment Situation: Unemployed Patient's Job has Been Impacted by Current Illness: No Has Patient ever Been in Equities trader?: No  Education: Education Is Patient Currently Attending School?: No Did Theme park manager?: Yes What Type of College Degree Do you Have?: 1 year of college Did You Have An Individualized Education Program (IIEP): No Did You Have Any Difficulty At [employer]?: No Patient's Education Has Been Impacted by Current Illness: No   CCA Family/Childhood History Family and Relationship History: Family history Marital status: Single Does patient have children?: No  Childhood History:  Childhood History By whom was/is the patient raised?: Both parents Did patient suffer any verbal/emotional/physical/sexual abuse as a child?: No Did patient suffer from severe childhood neglect?: No Has patient ever been sexually abused/assaulted/raped as an adolescent or adult?: No Was the patient ever a victim of a crime or a disaster?: No Witnessed domestic violence?: No Has patient been affected by domestic violence as an adult?: No  Child/Adolescent Assessment:     CCA Substance Use Alcohol/Drug Use: Alcohol / Drug Use Pain Medications: see MAR Prescriptions: see MAR Over the Counter: see MAR History of alcohol / drug use?: Yes Longest period of sobriety (when/how long): Unknown Negative Consequences of Use: Personal relationships Withdrawal Symptoms: Patient aware of relationship between substance abuse and physical/medical complications, Seizures Onset of Seizures: pt is not sure Date of most recent seizure: pt is not sure Substance #1 Name of Substance 1: Methamphetamines 1 - Age of First Use: unknown 1 - Frequency: Pt reports infrequent use 1 - Duration: Unknown 1 - Last Use / Amount: Unknown 1 - Method of Aquiring: Unknown Substance #2 Name of Substance 2: Alcohol 2 - Age of First Use: unknown 2 - Amount (size/oz): 12 beers 2 - Frequency: Daily 2  - Duration: Ongoing 2 - Last Use / Amount: 01/12/2021 2 - Method of Aquiring: Store 2 - Route of Substance Use: Drinking                     ASAM's:  Six Dimensions of Multidimensional Assessment  Dimension 1:  Acute Intoxication and/or Withdrawal Potential:   Dimension 1:  Description of individual's past and current experiences of substance use and withdrawal: Pt reports drinking approximately 12 beers daily  Dimension 2:  Biomedical Conditions and Complications:   Dimension 2:  Description of patient's biomedical conditions and  complications: None  Dimension 3:  Emotional, Behavioral, or Cognitive Conditions and Complications:  Dimension 3:  Description of emotional, behavioral, or cognitive  conditions and complications: Pt diagnosed with bipolar disorder  Dimension 4:  Readiness to Change:  Dimension 4:  Description of Readiness to Change criteria: Pt in pre-contemplation stage  Dimension 5:  Relapse, Continued use, or Continued Problem Potential:  Dimension 5:  Relapse, continued use, or continued problem potential critiera description: Pt has some understand of impact of substances on mental health symptoms  Dimension 6:  Recovery/Living Environment:  Dimension 6:  Recovery/Iiving environment criteria description: Pt is homeless on the streets  ASAM Severity Score: ASAM's Severity Rating Score: 11  ASAM Recommended Level of Treatment: ASAM Recommended Level of Treatment: Level III Residential Treatment   Substance use Disorder (SUD) Substance Use Disorder (SUD)  Checklist Symptoms of Substance Use: Continued use despite having a persistent/recurrent physical/psychological problem caused/exacerbated by use  Recommendations for Services/Supports/Treatments: Recommendations for Services/Supports/Treatments Recommendations For Services/Supports/Treatments: Inpatient Hospitalization  DSM5 Diagnoses: Patient Active Problem List   Diagnosis Date Noted   Bipolar 1 disorder (HCC)  11/14/2020   Bipolar 1 disorder, depressed, severe (HCC) 10/12/2020   Suicidal thoughts    Substance induced mood disorder (HCC) 10/07/2020   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    Patient Centered Plan: Patient is on the following Treatment Plan(s):  Depression   Referrals to Alternative Service(s): Referred to Alternative Service(s):   Place:   Date:   Time:    Referred to Alternative Service(s):   Place:   Date:   Time:    Referred to Alternative Service(s):   Place:   Date:   Time:    Referred to Alternative Service(s):   Place:   Date:   Time:     Pamalee Leyden, [facility]

## 2021-01-13 NOTE — Progress Notes (Signed)
Patient haas been awake for the majority of the shift.  He has been sitting in the day room watching tv.  He is calm and pleasant and without signs or symptoms of withdrawal.  Patient ciwa is 0.  He has been offered food and drink and has eaten adequately.  Patient is motivated for treatment and future oriented.  Denies avh shi or plan. Will continue to monitor and provide safe supportive  environment.

## 2021-01-14 DIAGNOSIS — R45851 Suicidal ideations: Secondary | ICD-10-CM | POA: Diagnosis not present

## 2021-01-14 DIAGNOSIS — F419 Anxiety disorder, unspecified: Secondary | ICD-10-CM | POA: Diagnosis not present

## 2021-01-14 DIAGNOSIS — F314 Bipolar disorder, current episode depressed, severe, without psychotic features: Secondary | ICD-10-CM | POA: Diagnosis not present

## 2021-01-14 DIAGNOSIS — F1290 Cannabis use, unspecified, uncomplicated: Secondary | ICD-10-CM | POA: Diagnosis not present

## 2021-01-14 MED ORDER — ONDANSETRON 4 MG PO TBDP: 4.0000 mg | ORAL_TABLET | Freq: Four times a day (QID) | ORAL | Status: DC | PRN

## 2021-01-14 MED ORDER — NAPROXEN 500 MG PO TABS: 500.0000 mg | ORAL_TABLET | Freq: Two times a day (BID) | ORAL | Status: DC | PRN

## 2021-01-14 MED ORDER — DICYCLOMINE HCL 20 MG PO TABS: 20.0000 mg | ORAL_TABLET | Freq: Four times a day (QID) | ORAL | Status: DC | PRN

## 2021-01-14 MED ORDER — METHOCARBAMOL 500 MG PO TABS: 500.0000 mg | ORAL_TABLET | Freq: Three times a day (TID) | ORAL | Status: DC | PRN

## 2021-01-14 MED ORDER — LOPERAMIDE HCL 2 MG PO CAPS
2.0000 mg | ORAL_CAPSULE | ORAL | Status: DC | PRN
  Administered 2021-01-15: 2 mg via ORAL

## 2021-01-14 MED ORDER — HYDROXYZINE HCL 25 MG PO TABS
25.0000 mg | ORAL_TABLET | Freq: Four times a day (QID) | ORAL | Status: DC | PRN
  Administered 2021-01-16: 25 mg via ORAL
  Filled 2021-01-14: qty 15
  Filled 2021-01-14: qty 1

## 2021-01-14 NOTE — Progress Notes (Signed)
Progress Note    Brett House   WCB:[id number]  DOB: 26-Nov-1985  DOA: 01/13/2021     0 Date of Service: 01/14/2021    Subjective:  Brett House is a 35 yo male with PMH significant for Bipolar Type I Disorder, substance use disorder, and alcohol use disorder at the [facility] for a suicide attempt via OD with Fentanyl and heroin on 11/12, as well as for substance and alcohol abuse. The pt states he was previously in a short-term alcohol treatment facility at [facility] and was discharged on 10/19, and from there he began staying at a long-term treatment facility at [facility], but that he was only there for 5 days before relapsing and leaving the facility. He states that he was triggered by seeing an old friend that he used to drink and use substances with, and that he started drinking and using opiates every day after seeing this friend. He states that he was depressed about relapsing, especially after his family told him that they did not want to speak to him anymore, so he ingested heroin and Fentanyl orally on 11/12 in an attempt to commit suicide.   Patient states that while he was relapsing, he was drinking 12-15 beers and a pint of liquor daily, beginning when he woke up and only ceasing when he went to bed. He states that during this time, he was couch-surfing with various friends but that he did not have a permanent place to live. He remarks that his only support person is his friend Brett House who he is able to talk about his situation with.  At this time, the patient is expressing a desire to go back to long-term treatment for substance and alcohol use. He says that his depression was mainly brought on by his shame over relapsing, and that he was feeling hopeless about his ability to not use opiates and alcohol. He describes current alcohol withdrawal symptoms of nausea without vomiting, diarrhea, shakiness, tremors, sweating, and headaches. He denies any chest pain, shortness of breath, suicidal  ideation, homicidal/harmful ideation, and AVH. He states that after he is discharged from the [facility], his safety plan is to call his friend Brett House if he begins to have any suicidal ideation.  Past Psychiatric History: Previous Medication Trials: yes, previous tx with Olanzapine 10mg , trazodone 50 mg, and fluoxetine 20mg . 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 Housing Status: Pt states he was living at long-term alcohol treatment facility before relapsing; since relapsing he has been staying with friends History of phys/sexual abuse: no Easy access to gun: no  Substance Use (with emphasis over the last 12 months) Recreational Drugs: Opiate use Use of Alcohol: heavy Tobacco Use: yes Rehab History: yes H/O Complicated Withdrawal: no  Legal History: Past Charges/Incarcerations: did not ask Pending charges: did not ask  Family Psychiatric History: not asked  Objective Vitals:   01/13/21 0621 01/13/21 1300 01/13/21 1800 01/14/21 0636  BP: 137/87 137/87 128/80 120/74  Pulse: 79 79 92 81  Resp: 16  20 18   Temp: 98.2 F (36.8 C)  97.9 F (36.6 C) 98.7 F (37.1 C)  TempSrc: Oral   Tympanic  SpO2: 97%  98% 98%  Vital signs were reviewed and unremarkable.   Exam Physical Exam Vitals reviewed.  Constitutional:      Appearance: He is normal weight.  HENT:     Head: Normocephalic and atraumatic.  Cardiovascular:     Rate and Rhythm: Normal rate and regular rhythm.     Pulses: Normal pulses.     Heart sounds: Normal heart sounds.  Pulmonary:     Effort: Pulmonary effort is normal.     Breath sounds: Normal breath sounds.  Skin:     General: Skin is warm and dry.  Neurological:     Mental Status: He is alert and oriented to person, place, and time.     Motor: Tremor present.     Labs / Other Information My review of labs, imaging, notes and other tests is significant for EtOH of 325mg /dL on , and K of 3.1 on 11/12    Results for orders placed or performed during the hospital encounter of 01/13/21 (from the past 48 hour(s))  Magnesium     Status: None   Collection Time: 01/12/21  3:08 PM  Result Value Ref Range   Magnesium 2.4 1.7 - 2.4 mg/dL    Comment: Performed at [facility], [address], [place], [place]  Lipid panel     Status: Abnormal   Collection Time: 01/12/21  3:08 PM  Result Value Ref Range   Cholesterol 198 0 - 200 mg/dL   Triglycerides 13/12/22 (H) <150 mg/dL   HDL 76 782 mg/dL   Total CHOL/HDL Ratio 2.6 RATIO   VLDL 56 (H) 0 - 40 mg/dL   LDL Cholesterol 66 0 - 99 mg/dL    Comment:        Total Cholesterol/HDL:CHD Risk Coronary Heart Disease Risk Table                     Men   Women  1/2 Average Risk   3.4   3.3  Average Risk       5.0   4.4  2 X Average Risk   9.6   7.1  3 X Average Risk  23.4   11.0        Use the calculated Patient Ratio above and the CHD Risk Table to determine the patient's CHD Risk.        ATP III CLASSIFICATION (LDL):  <100     mg/dL   Optimal  >95  mg/dL   Near or Above                    Optimal  130-159  mg/dL   Borderline  621-308  mg/dL   High  657-846     mg/dL   Very High Performed at [facility], [address] [place], [place]   TSH     Status: None   Collection Time: 01/12/21  3:08 PM  Result Value Ref Range   TSH 1.069 0.350 - 4.500 uIU/mL    Comment: Performed by a 3rd Generation assay with a functional sensitivity of <=0.01 uIU/mL. Performed at [facility], [address], [place], [place]       Treatment Plan Summary Brett House is a 35 yo male  w/ PMH of Bipolar Type I, substance use, and alcohol use in the [facility] following a suicide attempt via OD on 11/12 and alcohol detoxification. He states a desire to "get clean" and be placed in an  alcohol treatment facility following discharge from the [facility]. He is currently on a benzodiazepine taper for alcohol withdrawals that is scheduled to end 11/17. He denies any current SI, HI, thoughts of self-harm, but endorses that he does feel depressed about relapsing.  Alcohol Withdrawal -Continue lorazepam taper -Hydroxyzine 25mg  q6hrs PRN for anxiety/agitation -Ondansetron 4mg  PRN for nausea -Thiamine 100mg  daily -Trazodone 50mg  PRN for sleep  Bipolar Type I Disorder -Continue Olanzapine 10mg   -Fluoxetine 20 mg  Tobacco Use -Nicoderm CQ 21mg  patch   Hypokalemia -Continue KCl tablet daily  Disposition: Pt to continue alcohol withdrawal treatment through 11/17. Pt states desire to be placed into short-term alcohol treatment facility on discharge.    Time spent: 20 minutes , PA-S 01/14/2021, 10:46 AM

## 2021-01-14 NOTE — Progress Notes (Signed)
Patient was up shortly for breakfast and then returned to sleep where he remains.  Denies avh shi or plan.  Mood calm thought process organized and logical.  He is goal oriented and is tolerating detox without signs or symptoms of active withdrawal.  Will continue to monitor and provide support as needed.

## 2021-01-14 NOTE — ED Notes (Signed)
Pt given dinner  

## 2021-01-14 NOTE — ED Notes (Signed)
Patient was asked by staff to attended wrap up group and declined stating he was tired and did not want to attended. Staff continued to encouraged patient to attend wrap up group.

## 2021-01-14 NOTE — ED Notes (Signed)
Pt asleep in bed. Respirations even and unlabored. Will continue to monitor for safety. ?

## 2021-01-14 NOTE — ED Notes (Signed)
Pt is resting in the bed at present. Patient denies SI,HI,AVH.  Respiratory is even and unlabored. No distress noted. Patient stated no complaints at present. will continue to monitor for safety.

## 2021-01-14 NOTE — Clinical Social Work Psych Note (Signed)
CSW Initial Note   CSW met with Brett House for introduction and to begin discussions regarding treatment and potential discharge planning.   He presented drowsy and irritable, however he was cooperative with engaging with this Probation officer.   Brett House denied having any SI, HI, or AVH at this time.   Brett House shared that he recently relapsed after completing a short-term program at [facility] and a long term residential treatment program at [facility].  He reports he is unaware of any triggering situations or events that led to his relapse. He reports he would like to be referred to a short-term program, so that he can participate in a long-term treatment program.   Brett House shared that he has struggled with homelessness for many years, and has stayed between friends and in "the streets". He reports he only has one support, who is a close friend.    Brett House requested to be referred to [facility] or [facility] for short-term residential treatment.    CSW will continue to follow for possible placement.       Radonna Ricker, MSW, LCSW Clinical Education officer, museum ([place]) [facility]

## 2021-01-14 NOTE — ED Notes (Signed)
Patient is asleep in bed without issue or complaint.  Appears comfortable.  No evidence of withdrawal symptoms.

## 2021-01-14 NOTE — ED Notes (Signed)
Pt is in his room sleeping, no distress noted, will continue to monitor patient for safety.

## 2021-01-14 NOTE — ED Provider Notes (Signed)
Behavioral Health Progress Note  Date and Time: 01/14/2021 3:34 PM Name: Brett House MRN:  [id number]  Subjective:    Patient seen in conjunction with PA Student Mina Marble; Original note by PA student & edited by me as appropriate for completeness and accuracy.    Pt is a 35 year old single male who presents unaccompanied to [facility] ED on 01/12/2021 after being found unconscious lying on a sidewalk. Pt reports he drank two 40-ounce beers and intentionally ingested heroin and Fentanyl in a suicide attempt. Pt reports feeling increasingly depressed for the past week. He was transferred to the [facility] for further treatment on 01/13/21.   Brett House is a 35 yo male with PMH significant for Bipolar Type I Disorder, substance use disorder, and alcohol use disorder at the [facility] for a suicide attempt via OD with Fentanyl and heroin on 11/12, as well as for substance and alcohol abuse. The pt states he was previously in a short-term alcohol treatment facility at [facility] and was discharged on 10/19, and from there he began staying at a long-term treatment facility at [facility], but that he was only there for 5 days before relapsing and leaving the facility. He states that he was triggered by seeing an old friend that he used to drink and use substances with, and that he started drinking and using opiates every day after seeing this friend. He states that he was depressed about relapsing, especially after his family told him that they did not want to speak to him anymore, so he ingested heroin and Fentanyl orally on 11/12 in an attempt to commit suicide.    Patient states that while he was relapsing, he was drinking 12-15 beers and a pint of liquor daily, beginning when he woke up and only ceasing when he went to bed. He states that during this time, he was couch-surfing with various friends but that he did not have a permanent place to live. He remarks that his only support person is his friend Brett House who  he is able to talk about his situation with.   At this time, the patient is expressing a desire to go back to long-term treatment for substance and alcohol use. He says that his depression was mainly brought on by his shame over relapsing, and that he was feeling hopeless about his ability to not use opiates and alcohol. He describes current alcohol withdrawal symptoms of nausea without vomiting, diarrhea, shakiness, tremors, sweating, and headaches. He denies any chest pain, shortness of breath, suicidal ideation, homicidal/harmful ideation, and AVH. He states that after he is discharged from the [facility], his safety plan is to call his friend Brett House if he begins to have any suicidal ideation.   Obtained from patient and from chart review Past Psychiatric History: Previous Medication Trials: yes, previous tx with Olanzapine 10mg , trazodone 50 mg, and fluoxetine 20mg . Previous Psychiatric Hospitalizations: yes for alcohol treatment; last noted in Sept 2022. Patient has had several psychiatric hospitalizations in the past including hospitalizations in [place], [facility] & also [place].  His most recent psychiatric hospitalization at our facility was towards the end of July. Discharged from [facility] on 10-01-20. Was a patient here in November, 2021.  He has been discharged on lithium carbonate, Depakote & Seroquel in the past respectively. Previous Suicide Attempts: reports previous suicidal ideation with plan to throw self in front of a moving train History of Violence: denies Outpatient psychiatrist: no   Social History: Marital Status: single Children: 0 Source  of Income: Pt states he was working in [employer] Housing Status: Pt states he was living at long-term alcohol treatment facility before relapsing; since relapsing he has been staying with friends History of phys/sexual abuse: no Easy access to gun: no   Substance Use (with emphasis over the last 12 months) Recreational  Drugs: Opiate use, alcohol use Use of Alcohol: heavy Tobacco Use: yes Rehab History: yes H/O Complicated Withdrawal: no   Legal History: Past Charges/Incarcerations: did not ask Pending charges: did not ask   Family Psychiatric History: Per chart review, Patient reported to relatives with bipolar disorder and/or schizophrenia.     Diagnosis:  Final diagnoses:  Bipolar I disorder (HCC)  Suicide attempt (HCC)  Alcohol use disorder, severe, dependence (HCC)    Total Time spent with patient: 30 minutes  Past Psychiatric History: bipolar disorder, substance use Past Medical History:  Past Medical History:  Diagnosis Date   Anxiety    Asthma    Bipolar disorder (HCC)    Depression    No past surgical history on file. Family History: No family history on file. Family Psychiatric  History: Per chart review, Patient reported to relatives with bipolar disorder and/or schizophrenia.   Social History:  Social History   Substance and Sexual Activity  Alcohol Use Yes   Comment: 6 pack once a week for last 8-9 months     Social History   Substance and Sexual Activity  Drug Use Yes   Types: Marijuana   Comment: rarely    Social History   Socioeconomic History   Marital status: Single    Spouse name: Not on file   Number of children: 0   Years of education: Not on file   Highest education level: 12th grade  Occupational History   Occupation: Unemployed  Tobacco Use   Smoking status: Every Day    Packs/day: 1.50    Years: 15.00    Pack years: 22.50    Types: Cigarettes   Smokeless tobacco: Current  Vaping Use   Vaping Use: Never used  Substance and Sexual Activity   Alcohol use: Yes    Comment: 6 pack once a week for last 8-9 months   Drug use: Yes    Types: Marijuana    Comment: rarely   Sexual activity: Yes  Other Topics Concern   Not on file  Social History Narrative   Not on file   Social Determinants of Health   Financial Resource Strain: Not on  file  Food Insecurity: Not on file  Transportation Needs: Not on file  Physical Activity: Not on file  Stress: Not on file  Social Connections: Not on file   SDOH:  SDOH Screenings   Alcohol Screen: Low Risk    Last Alcohol Screening Score (AUDIT): 0  Depression (PHQ2-9): Not on file  Financial Resource Strain: Not on file  Food Insecurity: Not on file  Housing: Not on file  Physical Activity: Not on file  Social Connections: Not on file  Stress: Not on file  Tobacco Use: High Risk   Smoking Tobacco Use: Every Day   Smokeless Tobacco Use: Current   Passive Exposure: Not on file  Transportation Needs: Not on file   Additional Social History:                         Sleep: Fair  Appetite:  Fair  Current Medications:  Current Facility-Administered Medications  Medication Dose Route Frequency  Provider Last Rate Last Admin   acetaminophen (TYLENOL) tablet 650 mg  650 mg Oral Q6H PRN Bobbitt, Shalon E, NP       albuterol (VENTOLIN HFA) 108 (90 Base) MCG/ACT inhaler 1-2 puff  1-2 puff Inhalation Q4H PRN White, Patrice L, NP       alum & mag hydroxide-simeth (MAALOX/MYLANTA) 200-200-20 MG/5ML suspension 30 mL  30 mL Oral Q4H PRN Bobbitt, Shalon E, NP       FLUoxetine (PROZAC) capsule 20 mg  20 mg Oral Daily White, Patrice L, NP   20 mg at 01/14/21 1009   folic acid (FOLVITE) tablet 1 mg  1 mg Oral Daily Bobbitt, Shalon E, NP       hydrOXYzine (ATARAX/VISTARIL) tablet 25 mg  25 mg Oral Q6H PRN Bobbitt, Shalon E, NP       loperamide (IMODIUM) capsule 2-4 mg  2-4 mg Oral PRN Bobbitt, Shalon E, NP       LORazepam (ATIVAN) tablet 1 mg  1 mg Oral Q6H PRN Bobbitt, Shalon E, NP   1 mg at 01/13/21 0940   LORazepam (ATIVAN) tablet 1 mg  1 mg Oral TID Bobbitt, Shalon E, NP       Followed by   Melene Muller ON 01/15/2021] LORazepam (ATIVAN) tablet 1 mg  1 mg Oral BID Bobbitt, Shalon E, NP       Followed by   Melene Muller ON 01/17/2021] LORazepam (ATIVAN) tablet 1 mg  1 mg Oral Daily Bobbitt,  Shalon E, NP       magnesium hydroxide (MILK OF MAGNESIA) suspension 30 mL  30 mL Oral Daily PRN Bobbitt, Shalon E, NP       multivitamin with minerals tablet 1 tablet  1 tablet Oral Daily Bobbitt, Shalon E, NP   1 tablet at 01/14/21 1010   nicotine (NICODERM CQ - dosed in mg/24 hours) patch 21 mg  21 mg Transdermal Q0600 Bobbitt, Shalon E, NP   21 mg at 01/14/21 0630   OLANZapine (ZYPREXA) tablet 10 mg  10 mg Oral QHS White, Patrice L, NP   10 mg at 01/13/21 2103   ondansetron (ZOFRAN-ODT) disintegrating tablet 4 mg  4 mg Oral Q6H PRN Bobbitt, Shalon E, NP   4 mg at 01/13/21 0332   potassium chloride SA (KLOR-CON) CR tablet 20 mEq  20 mEq Oral Daily Bobbitt, Shalon E, NP   20 mEq at 01/14/21 1010   thiamine tablet 100 mg  100 mg Oral Daily Bobbitt, Shalon E, NP   100 mg at 01/14/21 1010   traZODone (DESYREL) tablet 50 mg  50 mg Oral QHS PRN Bobbitt, Shalon E, NP   50 mg at 01/13/21 2103   Current Outpatient Medications  Medication Sig Dispense Refill   albuterol (VENTOLIN HFA) 108 (90 Base) MCG/ACT inhaler Inhale 1-2 puffs into the lungs every 4 (four) hours as needed for wheezing or shortness of breath. 1 each 1   FLUoxetine (PROZAC) 20 MG capsule Take 1 capsule (20 mg total) by mouth daily. 30 capsule 1   hydrOXYzine (ATARAX/VISTARIL) 25 MG tablet Take 1 tablet (25 mg total) by mouth 3 (three) times daily as needed for anxiety (sleep). 30 tablet 1   nicotine (NICODERM CQ - DOSED IN MG/24 HOURS) 21 mg/24hr patch Place 1 patch (21 mg total) onto the skin daily. (Patient not taking: Reported on 01/12/2021) 28 patch 1   OLANZapine (ZYPREXA) 10 MG tablet Take 10 mg by mouth at bedtime.     OLANZapine zydis (ZYPREXA) 10  MG disintegrating tablet Take 1 tablet (10 mg total) by mouth at bedtime. 30 tablet 1   traZODone (DESYREL) 50 MG tablet Take 1 tablet (50 mg total) by mouth at bedtime. 30 tablet 1    Labs  Lab Results:  Admission on 01/13/2021  Component Date Value Ref Range Status   Magnesium  01/12/2021 2.4  1.7 - 2.4 mg/dL Final   Performed at [facility], [address], [place], [place] [address]   Cholesterol 01/12/2021 198  0 - 200 mg/dL Final   Triglycerides 81/19/1478 281 (A)  <150 mg/dL Final   HDL 29/56/2130 76  >40 mg/dL Final   Total CHOL/HDL Ratio 01/12/2021 2.6  RATIO Final   VLDL 01/12/2021 56 (A)  0 - 40 mg/dL Final   LDL Cholesterol 01/12/2021 66  0 - 99 mg/dL Final   Comment:        Total Cholesterol/HDL:CHD Risk Coronary Heart Disease Risk Table                     Men   Women  1/2 Average Risk   3.4   3.3  Average Risk       5.0   4.4  2 X Average Risk   9.6   7.1  3 X Average Risk  23.4   11.0        Use the calculated Patient Ratio above and the CHD Risk Table to determine the patient's CHD Risk.        ATP III CLASSIFICATION (LDL):  <100     mg/dL   Optimal  865-784  mg/dL   Near or Above                    Optimal  130-159  mg/dL   Borderline  696-295  mg/dL   High  >284     mg/dL   Very High Performed at [facility], [address] [place], [place] [address]    TSH 01/12/2021 1.069  0.350 - 4.500 uIU/mL Final   Comment: Performed by a 3rd Generation assay with a functional sensitivity of <=0.01 uIU/mL. Performed at [facility], [address] [place], [place] [address]   Admission on 01/12/2021, Discharged on 01/13/2021  Component Date Value Ref Range Status   Sodium 01/12/2021 139  135 - 145 mmol/L Final   Potassium 01/12/2021 3.1 (A)  3.5 - 5.1 mmol/L Final   Chloride 01/12/2021 107  98 - 111 mmol/L Final   CO2 01/12/2021 23  22 - 32 mmol/L Final   Glucose, Bld 01/12/2021 107 (A)  70 - 99 mg/dL Final   Glucose reference range applies only to samples taken after fasting for at least 8 hours.   BUN 01/12/2021 6  6 - 20 mg/dL Final   Creatinine, Ser 01/12/2021 0.62  0.61 - 1.24 mg/dL Final   Calcium 25/36/6440 8.3 (A)  8.9 - 10.3 mg/dL Final   Total Protein 34/74/2595 6.5  6.5  - 8.1 g/dL Final   Albumin 63/87/5643 4.1  3.5 - 5.0 g/dL Final   AST 32/95/1884 29  15 - 41 U/L Final   ALT 01/12/2021 22  0 - 44 U/L Final   Alkaline Phosphatase 01/12/2021 65  38 - 126 U/L Final   Total Bilirubin 01/12/2021 0.6  0.3 - 1.2 mg/dL Final   GFR, Estimated 01/12/2021 >60  >60 mL/min Final   Comment: (NOTE) Calculated using the CKD-EPI Creatinine Equation (2021)  Anion gap 01/12/2021 9  5 - 15 Final   Performed at [facility], [address], [place], [place] [address]   Alcohol, Ethyl (B) 01/12/2021 325 (A)  <10 mg/dL Final   Comment: CRITICAL RESULT CALLED TO, READ BACK BY AND VERIFIED WITH: SONYA, RN @ 1624 ON 01/12/2021 BY LBROOKS, MLT (NOTE) Lowest detectable limit for serum alcohol is 10 mg/dL.  For medical purposes only. Performed at [facility], [address], [place], [place] [address]    Salicylate Lvl 01/12/2021 <7.0 (A)  7.0 - 30.0 mg/dL Final   Performed at [facility], [address], [place], [place] [address]   Acetaminophen (Tylenol), Serum 01/12/2021 <10 (A)  10 - 30 ug/mL Final   Comment: (NOTE) Therapeutic concentrations vary significantly. A range of 10-30 ug/mL  may be an effective concentration for many patients. However, some  are best treated at concentrations outside of this range. Acetaminophen concentrations >150 ug/mL at 4 hours after ingestion  and >50 ug/mL at 12 hours after ingestion are often associated with  toxic reactions.  Performed at [facility], [address], [place], [place] [address]    WBC 01/12/2021 9.3  4.0 - 10.5 K/uL Final   RBC 01/12/2021 4.86  4.22 - 5.81 MIL/uL Final   Hemoglobin 01/12/2021 15.3  13.0 - 17.0 g/dL Final   HCT 95/32/0233 43.6  39.0 - 52.0 % Final   MCV 01/12/2021 89.7  80.0 - 100.0 fL Final   MCH 01/12/2021 31.5  26.0 - 34.0 pg Final   MCHC 01/12/2021 35.1  30.0 - 36.0 g/dL Final   RDW 43/56/8616 13.2  11.5 - 15.5  % Final   Platelets 01/12/2021 271  150 - 400 K/uL Final   nRBC 01/12/2021 0.0  0.0 - 0.2 % Final   Performed at [facility], [address], [place], [place] [address]   Opiates 01/12/2021 NONE DETECTED  NONE DETECTED Final   Cocaine 01/12/2021 NONE DETECTED  NONE DETECTED Final   Benzodiazepines 01/12/2021 NONE DETECTED  NONE DETECTED Final   Amphetamines 01/12/2021 NONE DETECTED  NONE DETECTED Final   Tetrahydrocannabinol 01/12/2021 NONE DETECTED  NONE DETECTED Final   Barbiturates 01/12/2021 NONE DETECTED  NONE DETECTED Final   Comment: (NOTE) DRUG SCREEN FOR MEDICAL PURPOSES ONLY.  IF CONFIRMATION IS NEEDED FOR ANY PURPOSE, NOTIFY LAB WITHIN 5 DAYS.  LOWEST DETECTABLE LIMITS FOR URINE DRUG SCREEN Drug Class                     Cutoff (ng/mL) Amphetamine and metabolites    1000 Barbiturate and metabolites    200 Benzodiazepine                 200 Tricyclics and metabolites     300 Opiates and metabolites        300 Cocaine and metabolites        300 THC                            50 Performed at [facility], [address], [place], [place] [address]    SARS Coronavirus 2 by RT PCR 01/12/2021 NEGATIVE  NEGATIVE Final   Comment: (NOTE) SARS-CoV-2 target nucleic acids are NOT DETECTED.  The SARS-CoV-2 RNA is generally detectable in upper respiratory specimens during the acute phase of infection. The lowest concentration of SARS-CoV-2 viral copies this assay can  detect is 138 copies/mL. A negative result does not preclude SARS-Cov-2 infection and should not be used as the sole basis for treatment or other patient management decisions. A negative result may occur with  improper specimen collection/handling, submission of specimen other than nasopharyngeal swab, presence of viral mutation(s) within the areas targeted by this assay, and inadequate number of viral copies(<138 copies/mL). A negative result must be combined with clinical  observations, patient history, and epidemiological information. The expected result is Negative.  Fact Sheet for Patients:  BloggerCourse.com  Fact Sheet for Healthcare Providers:  SeriousBroker.it  This test is no                          t yet approved or cleared by the [place] FDA and  has been authorized for detection and/or diagnosis of SARS-CoV-2 by FDA under an Emergency Use Authorization (EUA). This EUA will remain  in effect (meaning this test can be used) for the duration of the COVID-19 declaration under Section 564(b)(1) of the Act, 21 U.S.C.section 360bbb-3(b)(1), unless the authorization is terminated  or revoked sooner.       Influenza A by PCR 01/12/2021 NEGATIVE  NEGATIVE Final   Influenza B by PCR 01/12/2021 NEGATIVE  NEGATIVE Final   Comment: (NOTE) The Xpert Xpress SARS-CoV-2/FLU/RSV plus assay is intended as an aid in the diagnosis of influenza from Nasopharyngeal swab specimens and should not be used as a sole basis for treatment. Nasal washings and aspirates are unacceptable for Xpert Xpress SARS-CoV-2/FLU/RSV testing.  Fact Sheet for Patients: BloggerCourse.com  Fact Sheet for Healthcare Providers: SeriousBroker.it  This test is not yet approved or cleared by the [place] FDA and has been authorized for detection and/or diagnosis of SARS-CoV-2 by FDA under an Emergency Use Authorization (EUA). This EUA will remain in effect (meaning this test can be used) for the duration of the COVID-19 declaration under Section 564(b)(1) of the Act, 21 U.S.C. section 360bbb-3(b)(1), unless the authorization is terminated or revoked.  Performed at [facility], [address] [place], [place] [address]   Admission on 11/14/2020, Discharged on 11/19/2020  Component Date Value Ref Range Status   RPR Ser Ql 11/14/2020 NON REACTIVE  NON  REACTIVE Final   Performed at [facility], [address], [place], [place] [address]   Hepatitis B Surface Ag 11/14/2020 NON REACTIVE  NON REACTIVE Final   HCV Ab 11/14/2020 NON REACTIVE  NON REACTIVE Final   Comment: (NOTE) Nonreactive HCV antibody screen is consistent with no HCV infections,  unless recent infection is suspected or other evidence exists to indicate HCV infection.     Hep A IgM 11/14/2020 NON REACTIVE  NON REACTIVE Final   Hep B C IgM 11/14/2020 NON REACTIVE  NON REACTIVE Final   Performed at [facility], [address], [place], [place] [address]   HIV Screen 4th Generation wRfx 11/14/2020 Non Reactive  Non Reactive Final   Performed at [facility], [address] [place] [address] [place], [place] [address]  Admission on 11/13/2020, Discharged on 11/14/2020  Component Date Value Ref Range Status   Sodium 11/13/2020 136  135 - 145 mmol/L Final   Potassium 11/13/2020 3.8  3.5 - 5.1 mmol/L Final   Chloride 11/13/2020 101  98 - 111 mmol/L Final   CO2 11/13/2020 24  22 - 32 mmol/L Final   Glucose, Bld 11/13/2020 102 (A)  70 - 99 mg/dL Final  Glucose reference range applies only to samples taken after fasting for at least 8 hours.   BUN 11/13/2020 5 (A)  6 - 20 mg/dL Final   Creatinine, Ser 11/13/2020 0.69  0.61 - 1.24 mg/dL Final   Calcium 69/62/9528 9.3  8.9 - 10.3 mg/dL Final   Total Protein 41/32/4401 7.3  6.5 - 8.1 g/dL Final   Albumin 02/72/5366 4.5  3.5 - 5.0 g/dL Final   AST 44/05/4740 21  15 - 41 U/L Final   ALT 11/13/2020 14  0 - 44 U/L Final   Alkaline Phosphatase 11/13/2020 97  38 - 126 U/L Final   Total Bilirubin 11/13/2020 1.3 (A)  0.3 - 1.2 mg/dL Final   GFR, Estimated 11/13/2020 >60  >60 mL/min Final   Comment: (NOTE) Calculated using the CKD-EPI Creatinine Equation (2021)    Anion gap 11/13/2020 11  5 - 15 Final   Performed at [facility], [address], [place], [place] [address]   Alcohol, Ethyl (B) 11/13/2020 <10  <10 mg/dL Final    Comment: (NOTE) Lowest detectable limit for serum alcohol is 10 mg/dL.  For medical purposes only. Performed at [facility], [address], [place], [place] [address]    WBC 11/13/2020 7.1  4.0 - 10.5 K/uL Final   RBC 11/13/2020 5.02  4.22 - 5.81 MIL/uL Final   Hemoglobin 11/13/2020 16.0  13.0 - 17.0 g/dL Final   HCT 33/29/5188 46.2  39.0 - 52.0 % Final   MCV 11/13/2020 92.0  80.0 - 100.0 fL Final   MCH 11/13/2020 31.9  26.0 - 34.0 pg Final   MCHC 11/13/2020 34.6  30.0 - 36.0 g/dL Final   RDW 41/66/0630 13.6  11.5 - 15.5 % Final   Platelets 11/13/2020 269  150 - 400 K/uL Final   nRBC 11/13/2020 0.0  0.0 - 0.2 % Final   Performed at [facility], [address] [place], [place] [address]   Opiates 11/13/2020 NONE DETECTED  NONE DETECTED Final   Cocaine 11/13/2020 NONE DETECTED  NONE DETECTED Final   Benzodiazepines 11/13/2020 NONE DETECTED  NONE DETECTED Final   Amphetamines 11/13/2020 NONE DETECTED  NONE DETECTED Final   Tetrahydrocannabinol 11/13/2020 NONE DETECTED  NONE DETECTED Final   Barbiturates 11/13/2020 NONE DETECTED  NONE DETECTED Final   Comment: (NOTE) DRUG SCREEN FOR MEDICAL PURPOSES ONLY.  IF CONFIRMATION IS NEEDED FOR ANY PURPOSE, NOTIFY LAB WITHIN 5 DAYS.  LOWEST DETECTABLE LIMITS FOR URINE DRUG SCREEN Drug Class                     Cutoff (ng/mL) Amphetamine and metabolites    1000 Barbiturate and metabolites    200 Benzodiazepine                 200 Tricyclics and metabolites     300 Opiates and metabolites        300 Cocaine and metabolites        300 THC                            50 Performed at [facility], [address] [place], [place] [address]    Acetaminophen (Tylenol), Serum 11/13/2020 <10 (A)  10 - 30 ug/mL Final   Comment: (NOTE) Therapeutic concentrations vary significantly. A range of 10-30 ug/mL  may be an effective concentration for many patients. However, some  are best treated at  concentrations outside of  this range. Acetaminophen concentrations >150 ug/mL at 4 hours after ingestion  and >50 ug/mL at 12 hours after ingestion are often associated with  toxic reactions.  Performed at [facility], [address], [place], [place] [address]    Salicylate Lvl 11/13/2020 <7.0 (A)  7.0 - 30.0 mg/dL Final   Performed at [facility], [address] [place], [place] [address]   SARS Coronavirus 2 by RT PCR 11/13/2020 NEGATIVE  NEGATIVE Final   Comment: (NOTE) SARS-CoV-2 target nucleic acids are NOT DETECTED.  The SARS-CoV-2 RNA is generally detectable in upper respiratory specimens during the acute phase of infection. The lowest concentration of SARS-CoV-2 viral copies this assay can detect is 138 copies/mL. A negative result does not preclude SARS-Cov-2 infection and should not be used as the sole basis for treatment or other patient management decisions. A negative result may occur with  improper specimen collection/handling, submission of specimen other than nasopharyngeal swab, presence of viral mutation(s) within the areas targeted by this assay, and inadequate number of viral copies(<138 copies/mL). A negative result must be combined with clinical observations, patient history, and epidemiological information. The expected result is Negative.  Fact Sheet for Patients:  BloggerCourse.com  Fact Sheet for Healthcare Providers:  SeriousBroker.it  This test is no                          t yet approved or cleared by the [place] FDA and  has been authorized for detection and/or diagnosis of SARS-CoV-2 by FDA under an Emergency Use Authorization (EUA). This EUA will remain  in effect (meaning this test can be used) for the duration of the COVID-19 declaration under Section 564(b)(1) of the Act, 21 U.S.C.section 360bbb-3(b)(1), unless the authorization is terminated  or revoked sooner.       Influenza A by PCR  11/13/2020 NEGATIVE  NEGATIVE Final   Influenza B by PCR 11/13/2020 NEGATIVE  NEGATIVE Final   Comment: (NOTE) The Xpert Xpress SARS-CoV-2/FLU/RSV plus assay is intended as an aid in the diagnosis of influenza from Nasopharyngeal swab specimens and should not be used as a sole basis for treatment. Nasal washings and aspirates are unacceptable for Xpert Xpress SARS-CoV-2/FLU/RSV testing.  Fact Sheet for Patients: BloggerCourse.com  Fact Sheet for Healthcare Providers: SeriousBroker.it  This test is not yet approved or cleared by the [place] FDA and has been authorized for detection and/or diagnosis of SARS-CoV-2 by FDA under an Emergency Use Authorization (EUA). This EUA will remain in effect (meaning this test can be used) for the duration of the COVID-19 declaration under Section 564(b)(1) of the Act, 21 U.S.C. section 360bbb-3(b)(1), unless the authorization is terminated or revoked.  Performed at [facility], [address] [place], [place] [address]   Admission on 10/12/2020, Discharged on 10/16/2020  Component Date Value Ref Range Status   Valproic Acid Lvl 10/12/2020 29 (A)  50.0 - 100.0 ug/mL Final   Performed at [facility], [address] [place], [place] [address]  Admission on 10/11/2020, Discharged on 10/12/2020  Component Date Value Ref Range Status   SARS Coronavirus 2 by RT PCR 10/11/2020 NEGATIVE  NEGATIVE Final   Comment: (NOTE) SARS-CoV-2 target nucleic acids are NOT DETECTED.  The SARS-CoV-2 RNA is generally detectable in upper respiratory specimens during the acute phase of infection. The lowest concentration of SARS-CoV-2 viral copies this assay can detect is 138 copies/mL.  A negative result does not preclude SARS-Cov-2 infection and should not be used as the sole basis for treatment or other patient management decisions. A negative result may occur with  improper  specimen collection/handling, submission of specimen other than nasopharyngeal swab, presence of viral mutation(s) within the areas targeted by this assay, and inadequate number of viral copies(<138 copies/mL). A negative result must be combined with clinical observations, patient history, and epidemiological information. The expected result is Negative.  Fact Sheet for Patients:  BloggerCourse.com  Fact Sheet for Healthcare Providers:  SeriousBroker.it  This test is no                          t yet approved or cleared by the [place] FDA and  has been authorized for detection and/or diagnosis of SARS-CoV-2 by FDA under an Emergency Use Authorization (EUA). This EUA will remain  in effect (meaning this test can be used) for the duration of the COVID-19 declaration under Section 564(b)(1) of the Act, 21 U.S.C.section 360bbb-3(b)(1), unless the authorization is terminated  or revoked sooner.       Influenza A by PCR 10/11/2020 NEGATIVE  NEGATIVE Final   Influenza B by PCR 10/11/2020 NEGATIVE  NEGATIVE Final   Comment: (NOTE) The Xpert Xpress SARS-CoV-2/FLU/RSV plus assay is intended as an aid in the diagnosis of influenza from Nasopharyngeal swab specimens and should not be used as a sole basis for treatment. Nasal washings and aspirates are unacceptable for Xpert Xpress SARS-CoV-2/FLU/RSV testing.  Fact Sheet for Patients: BloggerCourse.com  Fact Sheet for Healthcare Providers: SeriousBroker.it  This test is not yet approved or cleared by the [place] FDA and has been authorized for detection and/or diagnosis of SARS-CoV-2 by FDA under an Emergency Use Authorization (EUA). This EUA will remain in effect (meaning this test can be used) for the duration of the COVID-19 declaration under Section 564(b)(1) of the Act, 21 U.S.C. section 360bbb-3(b)(1), unless the  authorization is terminated or revoked.  Performed at [facility], [address] [place], [place] [address]    Sodium 10/11/2020 132 (A)  135 - 145 mmol/L Final   Potassium 10/11/2020 3.5  3.5 - 5.1 mmol/L Final   Chloride 10/11/2020 99  98 - 111 mmol/L Final   CO2 10/11/2020 23  22 - 32 mmol/L Final   Glucose, Bld 10/11/2020 134 (A)  70 - 99 mg/dL Final   Glucose reference range applies only to samples taken after fasting for at least 8 hours.   BUN 10/11/2020 11  6 - 20 mg/dL Final   Creatinine, Ser 10/11/2020 0.78  0.61 - 1.24 mg/dL Final   Calcium 60/45/4098 9.0  8.9 - 10.3 mg/dL Final   Total Protein 11/91/4782 6.9  6.5 - 8.1 g/dL Final   Albumin 95/62/1308 4.2  3.5 - 5.0 g/dL Final   AST 65/78/4696 24  15 - 41 U/L Final   ALT 10/11/2020 21  0 - 44 U/L Final   Alkaline Phosphatase 10/11/2020 77  38 - 126 U/L Final   Total Bilirubin 10/11/2020 1.1  0.3 - 1.2 mg/dL Final   GFR, Estimated 10/11/2020 >60  >60 mL/min Final   Comment: (NOTE) Calculated using the CKD-EPI Creatinine Equation (2021)    Anion gap 10/11/2020 10  5 - 15 Final   Performed at [facility], [address], [place], [place] [address]   Alcohol, Ethyl (B) 10/11/2020 <10  <10 mg/dL Final   Comment: (  NOTE) Lowest detectable limit for serum alcohol is 10 mg/dL.  For medical purposes only. Performed at [facility], [address] [place], [place] [address]    Opiates 10/11/2020 NONE DETECTED  NONE DETECTED Final   Cocaine 10/11/2020 NONE DETECTED  NONE DETECTED Final   Benzodiazepines 10/11/2020 NONE DETECTED  NONE DETECTED Final   Amphetamines 10/11/2020 NONE DETECTED  NONE DETECTED Final   Tetrahydrocannabinol 10/11/2020 NONE DETECTED  NONE DETECTED Final   Barbiturates 10/11/2020 NONE DETECTED  NONE DETECTED Final   Comment: (NOTE) DRUG SCREEN FOR MEDICAL PURPOSES ONLY.  IF CONFIRMATION IS NEEDED FOR ANY PURPOSE, NOTIFY LAB WITHIN 5 DAYS.  LOWEST DETECTABLE LIMITS FOR URINE  DRUG SCREEN Drug Class                     Cutoff (ng/mL) Amphetamine and metabolites    1000 Barbiturate and metabolites    200 Benzodiazepine                 200 Tricyclics and metabolites     300 Opiates and metabolites        300 Cocaine and metabolites        300 THC                            50 Performed at [facility], [address] [place], [place] [address]    WBC 10/11/2020 10.3  4.0 - 10.5 K/uL Final   RBC 10/11/2020 4.48  4.22 - 5.81 MIL/uL Final   Hemoglobin 10/11/2020 13.7  13.0 - 17.0 g/dL Final   HCT 29/93/7169 39.6  39.0 - 52.0 % Final   MCV 10/11/2020 88.4  80.0 - 100.0 fL Final   MCH 10/11/2020 30.6  26.0 - 34.0 pg Final   MCHC 10/11/2020 34.6  30.0 - 36.0 g/dL Final   RDW 67/89/3810 12.6  11.5 - 15.5 % Final   Platelets 10/11/2020 362  150 - 400 K/uL Final   nRBC 10/11/2020 0.0  0.0 - 0.2 % Final   Neutrophils Relative % 10/11/2020 73  % Final   Neutro Abs 10/11/2020 7.4  1.7 - 7.7 K/uL Final   Lymphocytes Relative 10/11/2020 15  % Final   Lymphs Abs 10/11/2020 1.5  0.7 - 4.0 K/uL Final   Monocytes Relative 10/11/2020 10  % Final   Monocytes Absolute 10/11/2020 1.1 (A)  0.1 - 1.0 K/uL Final   Eosinophils Relative 10/11/2020 2  % Final   Eosinophils Absolute 10/11/2020 0.2  0.0 - 0.5 K/uL Final   Basophils Relative 10/11/2020 0  % Final   Basophils Absolute 10/11/2020 0.0  0.0 - 0.1 K/uL Final   Immature Granulocytes 10/11/2020 0  % Final   Abs Immature Granulocytes 10/11/2020 0.04  0.00 - 0.07 K/uL Final   Performed at [facility], [address] [place], [place] [address]   Salicylate Lvl 10/11/2020 <7.0 (A)  7.0 - 30.0 mg/dL Final   Performed at [facility], [address] [place], [place] [address]   Acetaminophen (Tylenol), Serum 10/11/2020 <10 (A)  10 - 30 ug/mL Final   Comment: (NOTE) Therapeutic concentrations vary significantly. A range of 10-30 ug/mL  may be an effective concentration for many patients. However, some  are  best treated at concentrations outside of this range. Acetaminophen concentrations >150 ug/mL at 4 hours after ingestion  and >50 ug/mL at 12 hours after ingestion are  often associated with  toxic reactions.  Performed at [facility], [address], [place], [place] [address]   Admission on 10/06/2020, Discharged on 10/07/2020  Component Date Value Ref Range Status   WBC 10/06/2020 7.1  4.0 - 10.5 K/uL Final   RBC 10/06/2020 4.45  4.22 - 5.81 MIL/uL Final   Hemoglobin 10/06/2020 13.7  13.0 - 17.0 g/dL Final   HCT 60/45/4098 40.3  39.0 - 52.0 % Final   MCV 10/06/2020 90.6  80.0 - 100.0 fL Final   MCH 10/06/2020 30.8  26.0 - 34.0 pg Final   MCHC 10/06/2020 34.0  30.0 - 36.0 g/dL Final   RDW 11/91/4782 12.7  11.5 - 15.5 % Final   Platelets 10/06/2020 286  150 - 400 K/uL Final   nRBC 10/06/2020 0.0  0.0 - 0.2 % Final   Performed at [facility], [address] [place], [place] [address]   Salicylate Lvl 10/06/2020 <7.0 (A)  7.0 - 30.0 mg/dL Final   Performed at [facility], [address], [place], [place] [address]   Acetaminophen (Tylenol), Serum 10/06/2020 <10 (A)  10 - 30 ug/mL Final   Comment: (NOTE) Therapeutic concentrations vary significantly. A range of 10-30 ug/mL  may be an effective concentration for many patients. However, some  are best treated at concentrations outside of this range. Acetaminophen concentrations >150 ug/mL at 4 hours after ingestion  and >50 ug/mL at 12 hours after ingestion are often associated with  toxic reactions.  Performed at [facility], [address] [place], [place] [address]    Sodium 10/06/2020 137  135 - 145 mmol/L Final   Potassium 10/06/2020 3.1 (A)  3.5 - 5.1 mmol/L Final   Chloride 10/06/2020 100  98 - 111 mmol/L Final   CO2 10/06/2020 25  22 - 32 mmol/L Final   Glucose, Bld 10/06/2020 104 (A)  70 - 99 mg/dL Final   Glucose reference range applies only to samples  taken after fasting for at least 8 hours.   BUN 10/06/2020 12  6 - 20 mg/dL Final   Creatinine, Ser 10/06/2020 0.62  0.61 - 1.24 mg/dL Final   Calcium 62/95/2841 8.9  8.9 - 10.3 mg/dL Final   Total Protein 32/44/0102 7.5  6.5 - 8.1 g/dL Final   Albumin 72/53/6644 4.9  3.5 - 5.0 g/dL Final   AST 03/47/4259 26  15 - 41 U/L Final   ALT 10/06/2020 31  0 - 44 U/L Final   Alkaline Phosphatase 10/06/2020 60  38 - 126 U/L Final   Total Bilirubin 10/06/2020 1.3 (A)  0.3 - 1.2 mg/dL Final   GFR, Estimated 10/06/2020 >60  >60 mL/min Final   Comment: (NOTE) Calculated using the CKD-EPI Creatinine Equation (2021)    Anion gap 10/06/2020 12  5 - 15 Final   Performed at [facility], [address], [place], [place] [address]   Alcohol, Ethyl (B) 10/06/2020 <10  <10 mg/dL Final   Comment: (NOTE) Lowest detectable limit for serum alcohol is 10 mg/dL.  For medical purposes only. Performed at [facility], [address], [place], [place] [address]    SARS Coronavirus 2 by RT PCR 10/06/2020 NEGATIVE  NEGATIVE Final   Comment: (NOTE) SARS-CoV-2 target nucleic acids are NOT DETECTED.  The SARS-CoV-2 RNA is generally detectable in upper respiratory specimens during the acute phase of infection. The lowest concentration of SARS-CoV-2 viral copies this assay can detect is  138 copies/mL. A negative result does not preclude SARS-Cov-2 infection and should not be used as the sole basis for treatment or other patient management decisions. A negative result may occur with  improper specimen collection/handling, submission of specimen other than nasopharyngeal swab, presence of viral mutation(s) within the areas targeted by this assay, and inadequate number of viral copies(<138 copies/mL). A negative result must be combined with clinical observations, patient history, and epidemiological information. The expected result is Negative.  Fact Sheet for Patients:   BloggerCourse.com  Fact Sheet for Healthcare Providers:  SeriousBroker.it  This test is no                          t yet approved or cleared by the [place] FDA and  has been authorized for detection and/or diagnosis of SARS-CoV-2 by FDA under an Emergency Use Authorization (EUA). This EUA will remain  in effect (meaning this test can be used) for the duration of the COVID-19 declaration under Section 564(b)(1) of the Act, 21 U.S.C.section 360bbb-3(b)(1), unless the authorization is terminated  or revoked sooner.       Influenza A by PCR 10/06/2020 NEGATIVE  NEGATIVE Final   Influenza B by PCR 10/06/2020 NEGATIVE  NEGATIVE Final   Comment: (NOTE) The Xpert Xpress SARS-CoV-2/FLU/RSV plus assay is intended as an aid in the diagnosis of influenza from Nasopharyngeal swab specimens and should not be used as a sole basis for treatment. Nasal washings and aspirates are unacceptable for Xpert Xpress SARS-CoV-2/FLU/RSV testing.  Fact Sheet for Patients: BloggerCourse.com  Fact Sheet for Healthcare Providers: SeriousBroker.it  This test is not yet approved or cleared by the [place] FDA and has been authorized for detection and/or diagnosis of SARS-CoV-2 by FDA under an Emergency Use Authorization (EUA). This EUA will remain in effect (meaning this test can be used) for the duration of the COVID-19 declaration under Section 564(b)(1) of the Act, 21 U.S.C. section 360bbb-3(b)(1), unless the authorization is terminated or revoked.  Performed at [facility], [address], [place], [place] [address]    Opiates 10/07/2020 NONE DETECTED  NONE DETECTED Final   Cocaine 10/07/2020 NONE DETECTED  NONE DETECTED Final   Benzodiazepines 10/07/2020 NONE DETECTED  NONE DETECTED Final   Amphetamines 10/07/2020 POSITIVE (A)  NONE DETECTED Final    Tetrahydrocannabinol 10/07/2020 NONE DETECTED  NONE DETECTED Final   Barbiturates 10/07/2020 NONE DETECTED  NONE DETECTED Final   Comment: (NOTE) DRUG SCREEN FOR MEDICAL PURPOSES ONLY.  IF CONFIRMATION IS NEEDED FOR ANY PURPOSE, NOTIFY LAB WITHIN 5 DAYS.  LOWEST DETECTABLE LIMITS FOR URINE DRUG SCREEN Drug Class                     Cutoff (ng/mL) Amphetamine and metabolites    1000 Barbiturate and metabolites    200 Benzodiazepine                 200 Tricyclics and metabolites     300 Opiates and metabolites        300 Cocaine and metabolites        300 THC                            50 Performed at [facility], [address] [place], [place] [address]   Admission on 09/25/2020, Discharged on 10/01/2020  Component Date Value Ref Range Status   Cholesterol 09/27/2020  161  0 - 200 mg/dL Final   Triglycerides 10/62/6948 156 (A)  <150 mg/dL Final   HDL 54/62/7035 52  >40 mg/dL Final   Total CHOL/HDL Ratio 09/27/2020 3.1  RATIO Final   VLDL 09/27/2020 31  0 - 40 mg/dL Final   LDL Cholesterol 09/27/2020 78  0 - 99 mg/dL Final   Comment:        Total Cholesterol/HDL:CHD Risk Coronary Heart Disease Risk Table                     Men   Women  1/2 Average Risk   3.4   3.3  Average Risk       5.0   4.4  2 X Average Risk   9.6   7.1  3 X Average Risk  23.4   11.0        Use the calculated Patient Ratio above and the CHD Risk Table to determine the patient's CHD Risk.        ATP III CLASSIFICATION (LDL):  <100     mg/dL   Optimal  009-381  mg/dL   Near or Above                    Optimal  130-159  mg/dL   Borderline  829-937  mg/dL   High  >169     mg/dL   Very High Performed at [facility], [address], [place], [place] [address]    Hgb A1c MFr Bld 09/27/2020 5.2  4.8 - 5.6 % Final   Comment: (NOTE)         Prediabetes: 5.7 - 6.4         Diabetes: >6.4         Glycemic control for adults with diabetes: <7.0    Mean Plasma  Glucose 09/27/2020 103  mg/dL Final   Comment: (NOTE) Performed At: [facility] [address] [place], [place] [id number] Jolene Schimke MD [id number]    WBC 10/01/2020 8.8  4.0 - 10.5 K/uL Final   RBC 10/01/2020 4.64  4.22 - 5.81 MIL/uL Final   Hemoglobin 10/01/2020 14.2  13.0 - 17.0 g/dL Final   HCT 36/14/4315 42.5  39.0 - 52.0 % Final   MCV 10/01/2020 91.6  80.0 - 100.0 fL Final   MCH 10/01/2020 30.6  26.0 - 34.0 pg Final   MCHC 10/01/2020 33.4  30.0 - 36.0 g/dL Final   RDW 40/10/6759 12.9  11.5 - 15.5 % Final   Platelets 10/01/2020 271  150 - 400 K/uL Final   nRBC 10/01/2020 0.0  0.0 - 0.2 % Final   Neutrophils Relative % 10/01/2020 56  % Final   Neutro Abs 10/01/2020 4.9  1.7 - 7.7 K/uL Final   Lymphocytes Relative 10/01/2020 24  % Final   Lymphs Abs 10/01/2020 2.1  0.7 - 4.0 K/uL Final   Monocytes Relative 10/01/2020 9  % Final   Monocytes Absolute 10/01/2020 0.8  0.1 - 1.0 K/uL Final   Eosinophils Relative 10/01/2020 6  % Final   Eosinophils Absolute 10/01/2020 0.5  0.0 - 0.5 K/uL Final   Basophils Relative 10/01/2020 1  % Final   Basophils Absolute 10/01/2020 0.1  0.0 - 0.1 K/uL Final   Immature Granulocytes 10/01/2020 4  % Final   Abs Immature Granulocytes 10/01/2020 0.33 (A)  0.00 - 0.07 K/uL Final   Performed at [facility], [address] [place], [place] [address]  Sodium 10/01/2020 140  135 - 145 mmol/L Final   Potassium 10/01/2020 4.0  3.5 - 5.1 mmol/L Final   Chloride 10/01/2020 105  98 - 111 mmol/L Final   CO2 10/01/2020 22  22 - 32 mmol/L Final   Glucose, Bld 10/01/2020 90  70 - 99 mg/dL Final   Glucose reference range applies only to samples taken after fasting for at least 8 hours.   BUN 10/01/2020 16  6 - 20 mg/dL Final   Creatinine, Ser 10/01/2020 0.67  0.61 - 1.24 mg/dL Final   Calcium 16/12/9602 9.2  8.9 - 10.3 mg/dL Final   Total Protein 54/11/8117 6.9  6.5 - 8.1 g/dL Final   Albumin 14/78/2956 4.4  3.5 - 5.0 g/dL  Final   AST 21/30/8657 32  15 - 41 U/L Final   ALT 10/01/2020 31  0 - 44 U/L Final   Alkaline Phosphatase 10/01/2020 51  38 - 126 U/L Final   Total Bilirubin 10/01/2020 0.5  0.3 - 1.2 mg/dL Final   GFR, Estimated 10/01/2020 >60  >60 mL/min Final   Comment: (NOTE) Calculated using the CKD-EPI Creatinine Equation (2021)    Anion gap 10/01/2020 13  5 - 15 Final   Performed at [facility], [address], [place], [place] [address]   Valproic Acid Lvl 10/01/2020 66  50.0 - 100.0 ug/mL Final   Performed at [facility], [address], [place], [place] [address]  Admission on 09/21/2020, Discharged on 09/25/2020  Component Date Value Ref Range Status   Sodium 09/21/2020 139  135 - 145 mmol/L Final   Potassium 09/21/2020 4.0  3.5 - 5.1 mmol/L Final   Chloride 09/21/2020 103  98 - 111 mmol/L Final   CO2 09/21/2020 25  22 - 32 mmol/L Final   Glucose, Bld 09/21/2020 111 (A)  70 - 99 mg/dL Final   Glucose reference range applies only to samples taken after fasting for at least 8 hours.   BUN 09/21/2020 15  6 - 20 mg/dL Final   Creatinine, Ser 09/21/2020 0.95  0.61 - 1.24 mg/dL Final   Calcium 41/32/4401 9.4  8.9 - 10.3 mg/dL Final   Total Protein 02/72/5366 6.8  6.5 - 8.1 g/dL Final   Albumin 44/05/4740 4.4  3.5 - 5.0 g/dL Final   AST 59/56/3875 15  15 - 41 U/L Final   ALT 09/21/2020 13  0 - 44 U/L Final   Alkaline Phosphatase 09/21/2020 62  38 - 126 U/L Final   Total Bilirubin 09/21/2020 0.6  0.3 - 1.2 mg/dL Final   GFR, Estimated 09/21/2020 >60  >60 mL/min Final   Comment: (NOTE) Calculated using the CKD-EPI Creatinine Equation (2021)    Anion gap 09/21/2020 11  5 - 15 Final   Performed at [facility], [address] [place], [place] [address]   Alcohol, Ethyl (B) 09/21/2020 <10  <10 mg/dL Final   Comment: (NOTE) Lowest detectable limit for serum alcohol is 10 mg/dL.  For medical purposes only. Performed at [facility], [address] [place], [place] [address]    Salicylate Lvl 09/21/2020 <7.0 (A)  7.0 - 30.0 mg/dL Final   Performed at [facility], [address], [place], [place] [address]   Acetaminophen (Tylenol), Serum 09/21/2020 <10 (A)  10 - 30 ug/mL Final   Comment: (NOTE) Therapeutic concentrations vary significantly. A range of 10-30 ug/mL  may be an effective concentration for many patients. However, some  are best treated at concentrations outside of this range. Acetaminophen concentrations >150 ug/mL at 4 hours after ingestion  and >50 ug/mL at 12 hours after ingestion are often associated with  toxic reactions.  Performed at [facility], [address], [place], [place] [address]    WBC 09/21/2020 10.5  4.0 - 10.5 K/uL Final   RBC 09/21/2020 4.58  4.22 - 5.81 MIL/uL Final   Hemoglobin 09/21/2020 13.9  13.0 - 17.0 g/dL Final   HCT 60/45/4098 40.0  39.0 - 52.0 % Final   MCV 09/21/2020 87.3  80.0 - 100.0 fL Final   MCH 09/21/2020 30.3  26.0 - 34.0 pg Final   MCHC 09/21/2020 34.8  30.0 - 36.0 g/dL Final   RDW 11/91/4782 12.3  11.5 - 15.5 % Final   Platelets 09/21/2020 292  150 - 400 K/uL Final   nRBC 09/21/2020 0.0  0.0 - 0.2 % Final   Performed at [facility], [address], [place], [place] [address]   Opiates 09/21/2020 NONE DETECTED  NONE DETECTED Final   Cocaine 09/21/2020 NONE DETECTED  NONE DETECTED Final   Benzodiazepines 09/21/2020 NONE DETECTED  NONE DETECTED Final   Amphetamines 09/21/2020 NONE DETECTED  NONE DETECTED Final   Tetrahydrocannabinol 09/21/2020 NONE DETECTED  NONE DETECTED Final   Barbiturates 09/21/2020 NONE DETECTED  NONE DETECTED Final   Comment: (NOTE) DRUG SCREEN FOR MEDICAL PURPOSES ONLY.  IF CONFIRMATION IS NEEDED FOR ANY PURPOSE, NOTIFY LAB WITHIN 5 DAYS.  LOWEST DETECTABLE LIMITS FOR URINE DRUG SCREEN Drug Class                     Cutoff (ng/mL) Amphetamine and metabolites    1000 Barbiturate and metabolites    200 Benzodiazepine                  200 Tricyclics and metabolites     300 Opiates and metabolites        300 Cocaine and metabolites        300 THC                            50 Performed at [facility], [address] [place], [place] [address]    SARS Coronavirus 2 by RT PCR 09/21/2020 NEGATIVE  NEGATIVE Final   Comment: (NOTE) SARS-CoV-2 target nucleic acids are NOT DETECTED.  The SARS-CoV-2 RNA is generally detectable in upper respiratory specimens during the acute phase of infection. The lowest concentration of SARS-CoV-2 viral copies this assay can detect is 138 copies/mL. A negative result does not preclude SARS-Cov-2 infection and should not be used as the sole basis for treatment or other patient management decisions. A negative result may occur with  improper specimen collection/handling, submission of specimen other than nasopharyngeal swab, presence of viral mutation(s) within the areas targeted by this assay, and inadequate number of viral copies(<138 copies/mL). A negative result must be combined with clinical observations, patient history, and epidemiological information. The expected result is Negative.  Fact Sheet for Patients:  BloggerCourse.com  Fact Sheet for Healthcare Providers:  SeriousBroker.it  This test is no                          t yet approved or cleared by the [place] FDA and  has been authorized for detection and/or diagnosis of SARS-CoV-2 by FDA under an Emergency Use  Authorization (EUA). This EUA will remain  in effect (meaning this test can be used) for the duration of the COVID-19 declaration under Section 564(b)(1) of the Act, 21 U.S.C.section 360bbb-3(b)(1), unless the authorization is terminated  or revoked sooner.       Influenza A by PCR 09/21/2020 NEGATIVE  NEGATIVE Final   Influenza B by PCR 09/21/2020 NEGATIVE  NEGATIVE Final   Comment: (NOTE) The Xpert Xpress SARS-CoV-2/FLU/RSV plus  assay is intended as an aid in the diagnosis of influenza from Nasopharyngeal swab specimens and should not be used as a sole basis for treatment. Nasal washings and aspirates are unacceptable for Xpert Xpress SARS-CoV-2/FLU/RSV testing.  Fact Sheet for Patients: BloggerCourse.com  Fact Sheet for Healthcare Providers: SeriousBroker.it  This test is not yet approved or cleared by the [place] FDA and has been authorized for detection and/or diagnosis of SARS-CoV-2 by FDA under an Emergency Use Authorization (EUA). This EUA will remain in effect (meaning this test can be used) for the duration of the COVID-19 declaration under Section 564(b)(1) of the Act, 21 U.S.C. section 360bbb-3(b)(1), unless the authorization is terminated or revoked.  Performed at [facility], [address], [place], [place] [address]    Color, Urine 09/21/2020 YELLOW  YELLOW Final   APPearance 09/21/2020 CLEAR  CLEAR Final   Specific Gravity, Urine 09/21/2020 1.014  1.005 - 1.030 Final   pH 09/21/2020 7.0  5.0 - 8.0 Final   Glucose, UA 09/21/2020 NEGATIVE  NEGATIVE mg/dL Final   Hgb urine dipstick 09/21/2020 NEGATIVE  NEGATIVE Final   Bilirubin Urine 09/21/2020 NEGATIVE  NEGATIVE Final   Ketones, ur 09/21/2020 NEGATIVE  NEGATIVE mg/dL Final   Protein, ur 78/29/5621 NEGATIVE  NEGATIVE mg/dL Final   Nitrite 30/86/5784 NEGATIVE  NEGATIVE Final   Leukocytes,Ua 09/21/2020 NEGATIVE  NEGATIVE Final   Performed at [facility], [address], [place], [place] [address]   Lithium Lvl 09/23/2020 0.39 (A)  0.60 - 1.20 mmol/L Final   Performed at [facility], [address], [place], [place] [address]   Sodium 09/23/2020 140  135 - 145 mmol/L Final   Potassium 09/23/2020 3.9  3.5 - 5.1 mmol/L Final   Chloride 09/23/2020 105  98 - 111 mmol/L Final   BUN 09/23/2020 14  6 - 20 mg/dL Final   Creatinine, Ser 09/23/2020 0.80  0.61 - 1.24 mg/dL  Final   Glucose, Bld 32/44/0102 96  70 - 99 mg/dL Final   Glucose reference range applies only to samples taken after fasting for at least 8 hours.   Calcium, Ion 09/23/2020 1.22  1.15 - 1.40 mmol/L Final   TCO2 09/23/2020 26  22 - 32 mmol/L Final   Hemoglobin 09/23/2020 14.3  13.0 - 17.0 g/dL Final   HCT 72/53/6644 42.0  39.0 - 52.0 % Final   Sodium 09/23/2020 138  135 - 145 mmol/L Final   Potassium 09/23/2020 4.1  3.5 - 5.1 mmol/L Final   Chloride 09/23/2020 105  98 - 111 mmol/L Final   CO2 09/23/2020 25  22 - 32 mmol/L Final   Glucose, Bld 09/23/2020 99  70 - 99 mg/dL Final   Glucose reference range applies only to samples taken after fasting for at least 8 hours.   BUN 09/23/2020 13  6 - 20 mg/dL Final   Creatinine, Ser 09/23/2020 0.91  0.61 - 1.24 mg/dL Final   Calcium 03/47/4259 9.2  8.9 - 10.3 mg/dL Final   Total Protein 56/38/7564 6.2 (  A)  6.5 - 8.1 g/dL Final   Albumin 16/12/9602 4.0  3.5 - 5.0 g/dL Final   AST 54/11/8117 13 (A)  15 - 41 U/L Final   ALT 09/23/2020 11  0 - 44 U/L Final   Alkaline Phosphatase 09/23/2020 49  38 - 126 U/L Final   Total Bilirubin 09/23/2020 0.7  0.3 - 1.2 mg/dL Final   GFR, Estimated 09/23/2020 >60  >60 mL/min Final   Comment: (NOTE) Calculated using the CKD-EPI Creatinine Equation (2021)    Anion gap 09/23/2020 8  5 - 15 Final   Performed at [facility], [address] [place], [place] [address]   WBC 09/23/2020 9.6  4.0 - 10.5 K/uL Final   RBC 09/23/2020 4.84  4.22 - 5.81 MIL/uL Final   Hemoglobin 09/23/2020 14.8  13.0 - 17.0 g/dL Final   HCT 95/62/1308 43.4  39.0 - 52.0 % Final   MCV 09/23/2020 89.7  80.0 - 100.0 fL Final   MCH 09/23/2020 30.6  26.0 - 34.0 pg Final   MCHC 09/23/2020 34.1  30.0 - 36.0 g/dL Final   RDW 65/78/4696 12.4  11.5 - 15.5 % Final   Platelets 09/23/2020 291  150 - 400 K/uL Final   nRBC 09/23/2020 0.0  0.0 - 0.2 % Final   Performed at [facility], [address] [place], [place] [address]    Glucose-Capillary 09/23/2020 100 (A)  70 - 99 mg/dL Final   Glucose reference range applies only to samples taken after fasting for at least 8 hours.   Comment 1 09/23/2020 Notify RN   Final   Troponin I (High Sensitivity) 09/23/2020 3  <18 ng/L Final   Comment: (NOTE) Elevated high sensitivity troponin I (hsTnI) values and significant  changes across serial measurements may suggest ACS but many other  chronic and acute conditions are known to elevate hsTnI results.  Refer to the "Links" section for chest pain algorithms and additional  guidance. Performed at [facility], [address], [place], [place] [address]    Magnesium 09/23/2020 2.3  1.7 - 2.4 mg/dL Final   Performed at [facility], [address], [place], [place] [address]   Phosphorus 09/23/2020 3.5  2.5 - 4.6 mg/dL Final   Performed at [facility], [address], [place], [place] [address]   TSH 09/23/2020 1.957  0.350 - 4.500 uIU/mL Final   Comment: Performed by a 3rd Generation assay with a functional sensitivity of <=0.01 uIU/mL. Performed at [facility], [address] [place], [place] [address]    T3, Free 09/23/2020 3.3  2.0 - 4.4 pg/mL Final   Comment: (NOTE) Performed At: [facility] [address] [place], [place] [id number] Jolene Schimke MD OV:[id number]    SARS Coronavirus 2 09/24/2020 NEGATIVE  NEGATIVE Final   Comment: (NOTE) SARS-CoV-2 target nucleic acids are NOT DETECTED.  The SARS-CoV-2 RNA is generally detectable in upper and lower respiratory specimens during the acute phase of infection. Negative results do not preclude SARS-CoV-2 infection, do not rule out co-infections with other pathogens, and should not be used as the sole basis for treatment or other patient management decisions. Negative results must be combined with clinical observations, patient history, and epidemiological information. The expected result is Negative.  Fact Sheet for  Patients: HairSlick.no  Fact Sheet for Healthcare Providers: quierodirigir.com  This test is not yet approved or cleared by the [place] and  has been authorized  for detection and/or diagnosis of SARS-CoV-2 by FDA under an Emergency Use Authorization (EUA). This EUA will remain  in effect (meaning this test can be used) for the duration of the COVID-19 declaration under Se                          ction 564(b)(1) of the Act, 21 U.S.C. section 360bbb-3(b)(1), unless the authorization is terminated or revoked sooner.  Performed at [facility], [address], [place], [place] [address]     Blood Alcohol level:  Lab Results  Component Value Date   ETH 325 [facility]) 01/12/2021   ETH <10 11/13/2020    Metabolic Disorder Labs: Lab Results  Component Value Date   HGBA1C 5.2 09/27/2020   MPG 103 09/27/2020   MPG 102.54 01/24/2020   No results found for: PROLACTIN Lab Results  Component Value Date   CHOL 198 01/12/2021   TRIG 281 (H) 01/12/2021   HDL 76 01/12/2021   CHOLHDL 2.6 01/12/2021   VLDL 56 (H) 01/12/2021   LDLCALC 66 01/12/2021   LDLCALC 78 09/27/2020    Therapeutic Lab Levels: Lab Results  Component Value Date   LITHIUM 0.39 (L) 09/23/2020   LITHIUM 0.38 (L) 01/27/2020   Lab Results  Component Value Date   VALPROATE 29 (L) 10/12/2020   VALPROATE 66 10/01/2020   No components found for:  CBMZ  Physical Findings   AIMS    Flowsheet Row [facility]  AIMS Total Score 0      AUDIT    Flowsheet Row [facility] Ad[facility]  Alcohol Use Disorder Identification Test Final Score (AUDIT) 0 8      Flowsheet Row ED from 01/13/2021 in [facility] ED from 01/12/2021 in [facility] from 11/14/2020 in [facility]  C-SSRS RISK CATEGORY High Risk High Risk High Risk        Musculoskeletal  Strength & Muscle Tone: within normal limits Gait & Station: normal Patient leans: N/A  Psychiatric Specialty Exam  Presentation  General Appearance: Appropriate for Environment; Other (comment) (dressed in hospital scrubs, well kempt beard, shaved head)  Eye Contact:Good  Speech:Clear and Coherent; Normal Rate  Speech Volume:Normal  Handedness:Right   Mood and Affect  Mood:Depressed  Affect:Appropriate; Congruent; Tearful; Depressed   Thought Process  Thought Processes:Coherent; Linear  Descriptions of Associations:Intact  Orientation:Full (Time, Place and Person)  Thought Content:Rumination; Logical (ruminating on substance use and desire to get sober)  Diagnosis of Schizophrenia or Schizoaffective disorder in past: No    Hallucinations:Hallucinations: None Description of Auditory Hallucinations: none  Ideas of Reference:None  Suicidal Thoughts:Suicidal Thoughts: No SI Active Intent and/or Plan: With Plan; With Means to Carry Out; With Intent  Homicidal Thoughts:Homicidal Thoughts: No   Sensorium  Memory:Immediate Good; Recent Good  Judgment:Good  Insight:Good (good insight into substance use)   Executive Functions  Concentration:Good  Attention Span:Good  Recall:Good  Fund of Knowledge:Good  Language:Good   Psychomotor Activity  Psychomotor Activity:Psychomotor Activity: Tremor (mild tremor in BUE with outstretched arms)   Assets  Assets:Desire for Improvement; Physical Health; Social Support; Resilience; Vocational/Educational   Sleep  Sleep:Sleep: Fair Number of Hours of Sleep: -1   Nutritional Assessment (For OBS and [facility] admissions only) Has the patient  had a weight loss or gain of 10 pounds or more in the last 3  months?: No Has the patient had a decrease in food intake/or appetite?: No Does the patient have dental problems?: No Does the patient have eating habits or behaviors that may be indicators of an eating disorder including binging or inducing vomiting?: No Has the patient recently lost weight without trying?: 0 Has the patient been eating poorly because of a decreased appetite?: 0 Malnutrition Screening Tool Score: 0   Physical Exam  Physical Exam Constitutional:      Appearance: Normal appearance. He is normal weight.  HENT:     Head: Normocephalic and atraumatic.  Eyes:     Extraocular Movements: Extraocular movements intact.  Cardiovascular:     Rate and Rhythm: Normal rate and regular rhythm.     Heart sounds: Normal heart sounds.  Pulmonary:     Effort: Pulmonary effort is normal.     Breath sounds: Normal breath sounds.  Neurological:     General: No focal deficit present.     Mental Status: He is alert and oriented to person, place, and time.  Psychiatric:        Attention and Perception: Attention and perception normal.        Speech: Speech normal.        Behavior: Behavior normal. Behavior is cooperative.        Thought Content: Thought content normal.   Review of Systems  Constitutional:  Negative for chills and fever.  HENT:  Negative for hearing loss.   Eyes:  Negative for discharge and redness.  Respiratory:  Negative for cough and shortness of breath.   Cardiovascular:  Negative for chest pain.  Gastrointestinal:  Positive for diarrhea and nausea. Negative for abdominal pain and vomiting.  Musculoskeletal:  Negative for myalgias.  Neurological:  Positive for tremors and headaches.  Psychiatric/Behavioral:  Positive for depression and substance abuse. Negative for hallucinations and suicidal ideas.   Blood pressure 120/74, pulse 81, temperature 98.7 F (37.1 C), temperature source Tympanic, resp. rate 18, SpO2 98 %. There is no height or weight on file to  calculate BMI.  Treatment Plan Summary: Niki Cosman is a 35 yo male w/ PMH of Bipolar Type I, substance use, and alcohol use in the [facility] following a suicide attempt via OD on 11/12 and alcohol detoxification. He was transferred to the [facility] on 11/13 for further treatment. Today, he reports a desire to "get clean" and be placed in an alcohol treatment facility following discharge from the [facility]. Etoh 325; UDS negative. He is currently on a benzodiazepine taper for alcohol withdrawals that is scheduled to end 11/17. He is reporting opioid use and will make PRNs available for comfort. He is reporting alcohol/opioid withdrawal sx of tremors, nausea, diarrhea, headaches, sweating. He denies any current SI, HI, thoughts of self-harm, but endorses that he does feel depressed about relapsing. He remains appropriate for admission to the [facility] for continued alcohol detox.    Alcohol Withdrawal -Continue lorazepam taper- ativan 1 mg QID, followed by 1 mg TID, 1 mg BID, and 1 mg daily x 1 -Hydroxyzine 25mg  q6hrs PRN for anxiety/agitation -Ondansetron 4mg  PRN for nausea -Thiamine 100mg  daily -Trazodone 50mg  PRN for sleep   Bipolar Type I Disorder -Continue Olanzapine 10mg   -Fluoxetine 20 mg   Tobacco Use -Nicoderm CQ 21mg  patch    Hypokalemia -Continue KCl tablet daily  Opioid use disorder -patient reported using fentanyl prior to admission as a SA;  however, UDS negative. -Opiate Withdrawal Protocol: -Clonidine 0.1mg  PO q4hr PRN for withdrawal associated HTN -Bentyl 10mg  PO q6hr PRN for abdominal muscle cramps -Loperamide 2mg  PO q6hr PRN for diarrhea -Robaxin 750mg  PO q8hr PRN for muscle spasm -Zofran 4mg  SL q8hr PRN for nausea -Vistaril 25mg  PO q8hr PRN for anxiety    Disposition: ongoing. Pt to continue alcohol withdrawal treatment through 11/17. Likely discharge to  short-term alcohol treatment facility on discharge-[facility] vs [facility]    , MD 01/14/2021 3:34 PM

## 2021-01-15 DIAGNOSIS — R45851 Suicidal ideations: Secondary | ICD-10-CM | POA: Diagnosis not present

## 2021-01-15 DIAGNOSIS — F419 Anxiety disorder, unspecified: Secondary | ICD-10-CM | POA: Diagnosis not present

## 2021-01-15 DIAGNOSIS — F1290 Cannabis use, unspecified, uncomplicated: Secondary | ICD-10-CM | POA: Diagnosis not present

## 2021-01-15 DIAGNOSIS — F314 Bipolar disorder, current episode depressed, severe, without psychotic features: Secondary | ICD-10-CM | POA: Diagnosis not present

## 2021-01-15 MED ORDER — FLUOXETINE HCL 20 MG PO CAPS
40.0000 mg | ORAL_CAPSULE | Freq: Every day | ORAL | Status: DC
  Administered 2021-01-16: 40 mg via ORAL
  Filled 2021-01-15: qty 28
  Filled 2021-01-15: qty 2

## 2021-01-15 NOTE — Progress Notes (Addendum)
Pt's COWS was a 2

## 2021-01-15 NOTE — Clinical Social Work Psych Note (Signed)
Self-Sabotaging Behaviors  Self-Sabotaging Behaviors & Self Awareness   Date: 01/15/21  Type of Therapy and Topic:   Participation Level: None   Objective: In this group, patients will learn how to identify obstacles, self-sabotaging and enabling behaviors, as well as: what are they, why do we do them and what needs these behaviors meet. Discuss unhealthy relationships and how to have positive healthy boundaries with those that sabotage and enable. Explore aspects of self-sabotage and enabling in yourself and how to limit these self-destructive behaviors in everyday life.  Therapeutic Goals:  Patient will identify one obstacle that relates to self-sabotage and enabling behaviors Patient will identify one personal self-sabotaging or enabling behavior they did prior to admission Patient will state a plan to change the above identified behavior Patient will demonstrate ability to communicate their needs through discussion and/or role play.   Summary of Patient's Progress: Iniko did not participate in the group session. He did not want to participate due to being asleep. He reported he was "tired".    CSW provided patient with group materials and worksheets that address the objective and therapeutic goals listed above to be reviewed and completed.

## 2021-01-15 NOTE — Progress Notes (Signed)
Pt is awake, alert and oriented. Pt did not voice any complaints of pain or discomfort. Administered scheduled meds with no incident. Pt denies current SI/HI/AVH. Staff will monitor for pt's safety.

## 2021-01-15 NOTE — Clinical Social Work Psych Note (Addendum)
CSW Update Note  Brett House reports he feels "a little better". Brett House shared that he did not sleep well, stating "I was tossing and turning".   Brett House denied having any SI, HI or AVH at this time.   Brett House reports he believes his medications are working "for now".   Brett House reports he continues to have interest in residential treatment services. Brett House was referred to [facility] and [facility] for review.    Brett House denied having any additional questions or concerns at this time.        CSW will continue to follow for placement.     [name], MSW, LCSW Clinical Child psychotherapist (Facility Based Crisis) [facility]

## 2021-01-15 NOTE — ED Provider Notes (Signed)
Behavioral Health Progress Note  Date and Time: 01/14/2021 3:34 PM Name: Brett House MRN:  [id number]  Subjective:    Pt is a 35 year old single male who presents unaccompanied to [facility] ED on 01/12/2021 after being found unconscious lying on a sidewalk. Pt reports he drank two 40-ounce beers and intentionally ingested heroin and Fentanyl in a suicide attempt. Pt reports feeling increasingly depressed for the past week. He was transferred to the [facility] for further treatment on 01/13/21.  Most recent CIWA was 0; most recent COWS 2.  Patient tolerating Ativan taper well.  Patient has been medication compliant.  Patient interviewed in his room this morning he describes his mood is "alright".  He reports a good appetite and poor sleep.  He attributes his sleep to anxiety and describes waking up frequently throughout the night in addition to withdrawal symptoms of sweating.  Patient reports current withdrawal symptoms of headache, tremors, sweating and diarrhea.  He denies nausea, vomiting, runny eyes watering nose.  He denies SI/HI/AVH.  Patient states that his mood has been persistently depressed despite taking medications and reports that he had previously been on 80 mg of Prozac which he felt worked well.  Patient inquires about medication adjustment and is agreeable to increasing Prozac to 40 mg.  Discussed with patient the importance of taking Prozac with Zyprexa as patient has a history of bipolar disorder and unopposed SSRI could flip him into mania-patient verbalized understanding regarding importance of medications.  Patient has been attending groups.  Discussed with patient disposition-patient continues to express interest in going to substance use treatment and is looking forward to hear back from day [facility] or [facility].  Patient denies all other physical complaints at this time.  Informed patient that he will be notified once treatment team hears back from rehab.  Past Psychiatric History: bipolar  disorder, substance use Past Medical History:  Past Medical History:  Diagnosis Date   Anxiety    Asthma    Bipolar disorder (HCC)    Depression    No past surgical history on file. Family History: No family history on file. Family Psychiatric  History: Per chart review, Patient reported to relatives with bipolar disorder and/or schizophrenia.   Social History:  Social History   Substance and Sexual Activity  Alcohol Use Yes   Comment: 6 pack once a week for last 8-9 months     Social History   Substance and Sexual Activity  Drug Use Yes   Types: Marijuana   Comment: rarely    Social History   Socioeconomic History   Marital status: Single    Spouse name: Not on file   Number of children: 0   Years of education: Not on file   Highest education level: 12th grade  Occupational History   Occupation: Unemployed  Tobacco Use   Smoking status: Every Day    Packs/day: 1.50    Years: 15.00    Pack years: 22.50    Types: Cigarettes   Smokeless tobacco: Current  Vaping Use   Vaping Use: Never used  Substance and Sexual Activity   Alcohol use: Yes    Comment: 6 pack once a week for last 8-9 months   Drug use: Yes    Types: Marijuana    Comment: rarely   Sexual activity: Yes  Other Topics Concern   Not on file  Social History Narrative   Not on file   Social Determinants of Health   Financial Resource Strain: Not  on file  Food Insecurity: Not on file  Transportation Needs: Not on file  Physical Activity: Not on file  Stress: Not on file  Social Connections: Not on file   SDOH:  SDOH Screenings   Alcohol Screen: Low Risk    Last Alcohol Screening Score (AUDIT): 0  Depression (PHQ2-9): Not on file  Financial Resource Strain: Not on file  Food Insecurity: Not on file  Housing: Not on file  Physical Activity: Not on file  Social Connections: Not on file  Stress: Not on file  Tobacco Use: High Risk   Smoking Tobacco Use: Every Day   Smokeless Tobacco  Use: Current   Passive Exposure: Not on file  Transportation Needs: Not on file   Additional Social History:                         Sleep: Fair  Appetite:  Fair  Current Medications:  Current Facility-Administered Medications  Medication Dose Route Frequency Provider Last Rate Last Admin   acetaminophen (TYLENOL) tablet 650 mg  650 mg Oral Q6H PRN Bobbitt, Shalon E, NP       albuterol (VENTOLIN HFA) 108 (90 Base) MCG/ACT inhaler 1-2 puff  1-2 puff Inhalation Q4H PRN White, Patrice L, NP       alum & mag hydroxide-simeth (MAALOX/MYLANTA) 200-200-20 MG/5ML suspension 30 mL  30 mL Oral Q4H PRN Bobbitt, Shalon E, NP       FLUoxetine (PROZAC) capsule 20 mg  20 mg Oral Daily White, Patrice L, NP   20 mg at 01/14/21 1009   folic acid (FOLVITE) tablet 1 mg  1 mg Oral Daily Bobbitt, Shalon E, NP       hydrOXYzine (ATARAX/VISTARIL) tablet 25 mg  25 mg Oral Q6H PRN Bobbitt, Shalon E, NP       loperamide (IMODIUM) capsule 2-4 mg  2-4 mg Oral PRN Bobbitt, Shalon E, NP       LORazepam (ATIVAN) tablet 1 mg  1 mg Oral Q6H PRN Bobbitt, Shalon E, NP   1 mg at 01/13/21 0940   LORazepam (ATIVAN) tablet 1 mg  1 mg Oral TID Bobbitt, Shalon E, NP       Followed by   Melene Muller ON 01/15/2021] LORazepam (ATIVAN) tablet 1 mg  1 mg Oral BID Bobbitt, Shalon E, NP       Followed by   Melene Muller ON 01/17/2021] LORazepam (ATIVAN) tablet 1 mg  1 mg Oral Daily Bobbitt, Shalon E, NP       magnesium hydroxide (MILK OF MAGNESIA) suspension 30 mL  30 mL Oral Daily PRN Bobbitt, Shalon E, NP       multivitamin with minerals tablet 1 tablet  1 tablet Oral Daily Bobbitt, Shalon E, NP   1 tablet at 01/14/21 1010   nicotine (NICODERM CQ - dosed in mg/24 hours) patch 21 mg  21 mg Transdermal Q0600 Bobbitt, Shalon E, NP   21 mg at 01/14/21 0630   OLANZapine (ZYPREXA) tablet 10 mg  10 mg Oral QHS White, Patrice L, NP   10 mg at 01/13/21 2103   ondansetron (ZOFRAN-ODT) disintegrating tablet 4 mg  4 mg Oral Q6H PRN Bobbitt,  Shalon E, NP   4 mg at 01/13/21 0332   potassium chloride SA (KLOR-CON) CR tablet 20 mEq  20 mEq Oral Daily Bobbitt, Shalon E, NP   20 mEq at 01/14/21 1010   thiamine tablet 100 mg  100 mg Oral Daily Bobbitt, Franchot Mimes, NP  100 mg at 01/14/21 1010   traZODone (DESYREL) tablet 50 mg  50 mg Oral QHS PRN Bobbitt, Shalon E, NP   50 mg at 01/13/21 2103   Current Outpatient Medications  Medication Sig Dispense Refill   albuterol (VENTOLIN HFA) 108 (90 Base) MCG/ACT inhaler Inhale 1-2 puffs into the lungs every 4 (four) hours as needed for wheezing or shortness of breath. 1 each 1   FLUoxetine (PROZAC) 20 MG capsule Take 1 capsule (20 mg total) by mouth daily. 30 capsule 1   hydrOXYzine (ATARAX/VISTARIL) 25 MG tablet Take 1 tablet (25 mg total) by mouth 3 (three) times daily as needed for anxiety (sleep). 30 tablet 1   nicotine (NICODERM CQ - DOSED IN MG/24 HOURS) 21 mg/24hr patch Place 1 patch (21 mg total) onto the skin daily. (Patient not taking: Reported on 01/12/2021) 28 patch 1   OLANZapine (ZYPREXA) 10 MG tablet Take 10 mg by mouth at bedtime.     OLANZapine zydis (ZYPREXA) 10 MG disintegrating tablet Take 1 tablet (10 mg total) by mouth at bedtime. 30 tablet 1   traZODone (DESYREL) 50 MG tablet Take 1 tablet (50 mg total) by mouth at bedtime. 30 tablet 1    Labs  Lab Results:  Admission on 01/13/2021  Component Date Value Ref Range Status   Magnesium 01/12/2021 2.4  1.7 - 2.4 mg/dL Final   Performed at [facility], [address] [place], [place] [address]   Cholesterol 01/12/2021 198  0 - 200 mg/dL Final   Triglycerides 87/86/7672 281 (A)  <150 mg/dL Final   HDL 09/47/0962 76  >40 mg/dL Final   Total CHOL/HDL Ratio 01/12/2021 2.6  RATIO Final   VLDL 01/12/2021 56 (A)  0 - 40 mg/dL Final   LDL Cholesterol 01/12/2021 66  0 - 99 mg/dL Final   Comment:        Total Cholesterol/HDL:CHD Risk Coronary Heart Disease Risk Table                     Men   Women  1/2  Average Risk   3.4   3.3  Average Risk       5.0   4.4  2 X Average Risk   9.6   7.1  3 X Average Risk  23.4   11.0        Use the calculated Patient Ratio above and the CHD Risk Table to determine the patient's CHD Risk.        ATP III CLASSIFICATION (LDL):  <100     mg/dL   Optimal  836-629  mg/dL   Near or Above                    Optimal  130-159  mg/dL   Borderline  476-546  mg/dL   High  >503     mg/dL   Very High Performed at [facility], [address], [place], [place] [address]    TSH 01/12/2021 1.069  0.350 - 4.500 uIU/mL Final   Comment: Performed by a 3rd Generation assay with a functional sensitivity of <=0.01 uIU/mL. Performed at [facility], [address] [place], [place] [address]   Admission on 01/12/2021, Discharged on 01/13/2021  Component Date Value Ref Range Status   Sodium 01/12/2021 139  135 - 145 mmol/L Final   Potassium 01/12/2021 3.1 (A)  3.5 - 5.1 mmol/L Final   Chloride 01/12/2021 107  98 -  111 mmol/L Final   CO2 01/12/2021 23  22 - 32 mmol/L Final   Glucose, Bld 01/12/2021 107 (A)  70 - 99 mg/dL Final   Glucose reference range applies only to samples taken after fasting for at least 8 hours.   BUN 01/12/2021 6  6 - 20 mg/dL Final   Creatinine, Ser 01/12/2021 0.62  0.61 - 1.24 mg/dL Final   Calcium 16/12/9602 8.3 (A)  8.9 - 10.3 mg/dL Final   Total Protein 54/11/8117 6.5  6.5 - 8.1 g/dL Final   Albumin 14/78/2956 4.1  3.5 - 5.0 g/dL Final   AST 21/30/8657 29  15 - 41 U/L Final   ALT 01/12/2021 22  0 - 44 U/L Final   Alkaline Phosphatase 01/12/2021 65  38 - 126 U/L Final   Total Bilirubin 01/12/2021 0.6  0.3 - 1.2 mg/dL Final   GFR, Estimated 01/12/2021 >60  >60 mL/min Final   Comment: (NOTE) Calculated using the CKD-EPI Creatinine Equation (2021)    Anion gap 01/12/2021 9  5 - 15 Final   Performed at [facility] [address], [place], [place] [address]   Alcohol, Ethyl (B)  01/12/2021 325 (A)  <10 mg/dL Final   Comment: CRITICAL RESULT CALLED TO, READ BACK BY AND VERIFIED WITH: SONYA, RN @ 1624 ON 01/12/2021 BY LBROOKS, MLT (NOTE) Lowest detectable limit for serum alcohol is 10 mg/dL.  For medical purposes only. Performed at [facility], [address], [place], [place] [address]    Salicylate Lvl 01/12/2021 <7.0 (A)  7.0 - 30.0 mg/dL Final   Performed at [facility], [address], [place], [place] [address]   Acetaminophen (Tylenol), Serum 01/12/2021 <10 (A)  10 - 30 ug/mL Final   Comment: (NOTE) Therapeutic concentrations vary significantly. A range of 10-30 ug/mL  may be an effective concentration for many patients. However, some  are best treated at concentrations outside of this range. Acetaminophen concentrations >150 ug/mL at 4 hours after ingestion  and >50 ug/mL at 12 hours after ingestion are often associated with  toxic reactions.  Performed at [facility], [address] [place], [place] [address]    WBC 01/12/2021 9.3  4.0 - 10.5 K/uL Final   RBC 01/12/2021 4.86  4.22 - 5.81 MIL/uL Final   Hemoglobin 01/12/2021 15.3  13.0 - 17.0 g/dL Final   HCT 72/53/6644 43.6  39.0 - 52.0 % Final   MCV 01/12/2021 89.7  80.0 - 100.0 fL Final   MCH 01/12/2021 31.5  26.0 - 34.0 pg Final   MCHC 01/12/2021 35.1  30.0 - 36.0 g/dL Final   RDW 03/47/4259 13.2  11.5 - 15.5 % Final   Platelets 01/12/2021 271  150 - 400 K/uL Final   nRBC 01/12/2021 0.0  0.0 - 0.2 % Final   Performed at [facility], [address] [place], [place] [address]   Opiates 01/12/2021 NONE DETECTED  NONE DETECTED Final   Cocaine 01/12/2021 NONE DETECTED  NONE DETECTED Final   Benzodiazepines 01/12/2021 NONE DETECTED  NONE DETECTED Final   Amphetamines 01/12/2021 NONE DETECTED  NONE DETECTED Final   Tetrahydrocannabinol 01/12/2021 NONE DETECTED  NONE DETECTED Final   Barbiturates 01/12/2021 NONE  DETECTED  NONE DETECTED Final   Comment: (NOTE) DRUG SCREEN FOR MEDICAL PURPOSES ONLY.  IF CONFIRMATION IS NEEDED FOR ANY PURPOSE, NOTIFY LAB WITHIN 5 DAYS.  LOWEST DETECTABLE LIMITS FOR URINE DRUG SCREEN Drug Class  Cutoff (ng/mL) Amphetamine and metabolites    1000 Barbiturate and metabolites    200 Benzodiazepine                 200 Tricyclics and metabolites     300 Opiates and metabolites        300 Cocaine and metabolites        300 THC                            50 Performed at [facility], [address] [place], [place] [address]    SARS Coronavirus 2 by RT PCR 01/12/2021 NEGATIVE  NEGATIVE Final   Comment: (NOTE) SARS-CoV-2 target nucleic acids are NOT DETECTED.  The SARS-CoV-2 RNA is generally detectable in upper respiratory specimens during the acute phase of infection. The lowest concentration of SARS-CoV-2 viral copies this assay can detect is 138 copies/mL. A negative result does not preclude SARS-Cov-2 infection and should not be used as the sole basis for treatment or other patient management decisions. A negative result may occur with  improper specimen collection/handling, submission of specimen other than nasopharyngeal swab, presence of viral mutation(s) within the areas targeted by this assay, and inadequate number of viral copies(<138 copies/mL). A negative result must be combined with clinical observations, patient history, and epidemiological information. The expected result is Negative.  Fact Sheet for Patients:  BloggerCourse.com  Fact Sheet for Healthcare Providers:  SeriousBroker.it  This test is no                          t yet approved or cleared by the [place] FDA and  has been authorized for detection and/or diagnosis of SARS-CoV-2 by FDA under an Emergency Use Authorization (EUA). This EUA will remain  in effect (meaning this test can be  used) for the duration of the COVID-19 declaration under Section 564(b)(1) of the Act, 21 U.S.C.section 360bbb-3(b)(1), unless the authorization is terminated  or revoked sooner.       Influenza A by PCR 01/12/2021 NEGATIVE  NEGATIVE Final   Influenza B by PCR 01/12/2021 NEGATIVE  NEGATIVE Final   Comment: (NOTE) The Xpert Xpress SARS-CoV-2/FLU/RSV plus assay is intended as an aid in the diagnosis of influenza from Nasopharyngeal swab specimens and should not be used as a sole basis for treatment. Nasal washings and aspirates are unacceptable for Xpert Xpress SARS-CoV-2/FLU/RSV testing.  Fact Sheet for Patients: BloggerCourse.com  Fact Sheet for Healthcare Providers: SeriousBroker.it  This test is not yet approved or cleared by the [place] FDA and has been authorized for detection and/or diagnosis of SARS-CoV-2 by FDA under an Emergency Use Authorization (EUA). This EUA will remain in effect (meaning this test can be used) for the duration of the COVID-19 declaration under Section 564(b)(1) of the Act, 21 U.S.C. section 360bbb-3(b)(1), unless the authorization is terminated or revoked.  Performed at [facility], [address], [place], [place] [address]   Admission on 11/14/2020, Discharged on 11/19/2020  Component Date Value Ref Range Status   RPR Ser Ql 11/14/2020 NON REACTIVE  NON REACTIVE Final   Performed at [facility], [address], [place], [place] [address]   Hepatitis B Surface Ag 11/14/2020 NON REACTIVE  NON REACTIVE Final   HCV Ab 11/14/2020 NON REACTIVE  NON REACTIVE Final   Comment: (NOTE) Nonreactive HCV antibody screen is consistent with no HCV infections,  unless recent infection is suspected or other evidence exists to indicate HCV infection.     Hep A IgM 11/14/2020 NON REACTIVE  NON REACTIVE Final   Hep B C IgM 11/14/2020 NON REACTIVE  NON REACTIVE Final   Performed  at [facility], [address], [place], [place] [address]   HIV Screen 4th Generation wRfx 11/14/2020 Non Reactive  Non Reactive Final   Performed at [facility], [address], [place], [place] [address]  Admission on 11/13/2020, Discharged on 11/14/2020  Component Date Value Ref Range Status   Sodium 11/13/2020 136  135 - 145 mmol/L Final   Potassium 11/13/2020 3.8  3.5 - 5.1 mmol/L Final   Chloride 11/13/2020 101  98 - 111 mmol/L Final   CO2 11/13/2020 24  22 - 32 mmol/L Final   Glucose, Bld 11/13/2020 102 (A)  70 - 99 mg/dL Final   Glucose reference range applies only to samples taken after fasting for at least 8 hours.   BUN 11/13/2020 5 (A)  6 - 20 mg/dL Final   Creatinine, Ser 11/13/2020 0.69  0.61 - 1.24 mg/dL Final   Calcium 09/81/1914 9.3  8.9 - 10.3 mg/dL Final   Total Protein 78/29/5621 7.3  6.5 - 8.1 g/dL Final   Albumin 30/86/5784 4.5  3.5 - 5.0 g/dL Final   AST 69/62/9528 21  15 - 41 U/L Final   ALT 11/13/2020 14  0 - 44 U/L Final   Alkaline Phosphatase 11/13/2020 97  38 - 126 U/L Final   Total Bilirubin 11/13/2020 1.3 (A)  0.3 - 1.2 mg/dL Final   GFR, Estimated 11/13/2020 >60  >60 mL/min Final   Comment: (NOTE) Calculated using the CKD-EPI Creatinine Equation (2021)    Anion gap 11/13/2020 11  5 - 15 Final   Performed at [facility], [address] [place], [place] [address]   Alcohol, Ethyl (B) 11/13/2020 <10  <10 mg/dL Final   Comment: (NOTE) Lowest detectable limit for serum alcohol is 10 mg/dL.  For medical purposes only. Performed at [facility], [address] [place], [place] [address]    WBC 11/13/2020 7.1  4.0 - 10.5 K/uL Final   RBC 11/13/2020 5.02  4.22 - 5.81 MIL/uL Final   Hemoglobin 11/13/2020 16.0  13.0 - 17.0 g/dL Final   HCT 72/53/6644 46.2  39.0 - 52.0 % Final   MCV 11/13/2020 92.0  80.0 - 100.0 fL Final   MCH 11/13/2020 31.9  26.0 - 34.0 pg Final   MCHC 11/13/2020 34.6  30.0 - 36.0 g/dL Final   RDW 03/47/4259  13.6  11.5 - 15.5 % Final   Platelets 11/13/2020 269  150 - 400 K/uL Final   nRBC 11/13/2020 0.0  0.0 - 0.2 % Final   Performed at [facility], [address], [place], [place] [address]   Opiates 11/13/2020 NONE DETECTED  NONE DETECTED Final   Cocaine 11/13/2020 NONE DETECTED  NONE DETECTED Final   Benzodiazepines 11/13/2020 NONE DETECTED  NONE DETECTED Final   Amphetamines 11/13/2020 NONE DETECTED  NONE DETECTED Final   Tetrahydrocannabinol 11/13/2020 NONE DETECTED  NONE DETECTED Final   Barbiturates 11/13/2020 NONE DETECTED  NONE DETECTED Final   Comment: (NOTE) DRUG SCREEN FOR MEDICAL PURPOSES ONLY.  IF CONFIRMATION IS NEEDED FOR ANY PURPOSE, NOTIFY LAB WITHIN 5 DAYS.  LOWEST DETECTABLE LIMITS FOR URINE DRUG SCREEN Drug Class  Cutoff (ng/mL) Amphetamine and metabolites    1000 Barbiturate and metabolites    200 Benzodiazepine                 200 Tricyclics and metabolites     300 Opiates and metabolites        300 Cocaine and metabolites        300 THC                            50 Performed at [facility], [address], [place], [place] [address]    Acetaminophen (Tylenol), Serum 11/13/2020 <10 (A)  10 - 30 ug/mL Final   Comment: (NOTE) Therapeutic concentrations vary significantly. A range of 10-30 ug/mL  may be an effective concentration for many patients. However, some  are best treated at concentrations outside of this range. Acetaminophen concentrations >150 ug/mL at 4 hours after ingestion  and >50 ug/mL at 12 hours after ingestion are often associated with  toxic reactions.  Performed at [facility], [address], [place], [place] [address]    Salicylate Lvl 11/13/2020 <7.0 (A)  7.0 - 30.0 mg/dL Final   Performed at [facility], [address], [place], [place] [address]   SARS Coronavirus 2 by RT PCR 11/13/2020 NEGATIVE  NEGATIVE Final   Comment: (NOTE) SARS-CoV-2 target nucleic acids are NOT DETECTED.  The  SARS-CoV-2 RNA is generally detectable in upper respiratory specimens during the acute phase of infection. The lowest concentration of SARS-CoV-2 viral copies this assay can detect is 138 copies/mL. A negative result does not preclude SARS-Cov-2 infection and should not be used as the sole basis for treatment or other patient management decisions. A negative result may occur with  improper specimen collection/handling, submission of specimen other than nasopharyngeal swab, presence of viral mutation(s) within the areas targeted by this assay, and inadequate number of viral copies(<138 copies/mL). A negative result must be combined with clinical observations, patient history, and epidemiological information. The expected result is Negative.  Fact Sheet for Patients:  BloggerCourse.com  Fact Sheet for Healthcare Providers:  SeriousBroker.it  This test is no                          t yet approved or cleared by the [place] FDA and  has been authorized for detection and/or diagnosis of SARS-CoV-2 by FDA under an Emergency Use Authorization (EUA). This EUA will remain  in effect (meaning this test can be used) for the duration of the COVID-19 declaration under Section 564(b)(1) of the Act, 21 U.S.C.section 360bbb-3(b)(1), unless the authorization is terminated  or revoked sooner.       Influenza A by PCR 11/13/2020 NEGATIVE  NEGATIVE Final   Influenza B by PCR 11/13/2020 NEGATIVE  NEGATIVE Final   Comment: (NOTE) The Xpert Xpress SARS-CoV-2/FLU/RSV plus assay is intended as an aid in the diagnosis of influenza from Nasopharyngeal swab specimens and should not be used as a sole basis for treatment. Nasal washings and aspirates are unacceptable for Xpert Xpress SARS-CoV-2/FLU/RSV testing.  Fact Sheet for Patients: BloggerCourse.com  Fact Sheet for Healthcare  Providers: SeriousBroker.it  This test is not yet approved or cleared by the [place] FDA and has been authorized for detection and/or diagnosis of SARS-CoV-2 by FDA under an Emergency Use Authorization (EUA). This EUA will remain in effect (meaning this test can be used) for  the duration of the COVID-19 declaration under Section 564(b)(1) of the Act, 21 U.S.C. section 360bbb-3(b)(1), unless the authorization is terminated or revoked.  Performed at [facility], [address] [place], [place] [address]   Admission on 10/12/2020, Discharged on 10/16/2020  Component Date Value Ref Range Status   Valproic Acid Lvl 10/12/2020 29 (A)  50.0 - 100.0 ug/mL Final   Performed at [facility], [address] [place], [place] [address]  Admission on 10/11/2020, Discharged on 10/12/2020  Component Date Value Ref Range Status   SARS Coronavirus 2 by RT PCR 10/11/2020 NEGATIVE  NEGATIVE Final   Comment: (NOTE) SARS-CoV-2 target nucleic acids are NOT DETECTED.  The SARS-CoV-2 RNA is generally detectable in upper respiratory specimens during the acute phase of infection. The lowest concentration of SARS-CoV-2 viral copies this assay can detect is 138 copies/mL. A negative result does not preclude SARS-Cov-2 infection and should not be used as the sole basis for treatment or other patient management decisions. A negative result may occur with  improper specimen collection/handling, submission of specimen other than nasopharyngeal swab, presence of viral mutation(s) within the areas targeted by this assay, and inadequate number of viral copies(<138 copies/mL). A negative result must be combined with clinical observations, patient history, and epidemiological information. The expected result is Negative.  Fact Sheet for Patients:  BloggerCourse.com  Fact Sheet for Healthcare Providers:   SeriousBroker.it  This test is no                          t yet approved or cleared by the [place] FDA and  has been authorized for detection and/or diagnosis of SARS-CoV-2 by FDA under an Emergency Use Authorization (EUA). This EUA will remain  in effect (meaning this test can be used) for the duration of the COVID-19 declaration under Section 564(b)(1) of the Act, 21 U.S.C.section 360bbb-3(b)(1), unless the authorization is terminated  or revoked sooner.       Influenza A by PCR 10/11/2020 NEGATIVE  NEGATIVE Final   Influenza B by PCR 10/11/2020 NEGATIVE  NEGATIVE Final   Comment: (NOTE) The Xpert Xpress SARS-CoV-2/FLU/RSV plus assay is intended as an aid in the diagnosis of influenza from Nasopharyngeal swab specimens and should not be used as a sole basis for treatment. Nasal washings and aspirates are unacceptable for Xpert Xpress SARS-CoV-2/FLU/RSV testing.  Fact Sheet for Patients: BloggerCourse.com  Fact Sheet for Healthcare Providers: SeriousBroker.it  This test is not yet approved or cleared by the [place] FDA and has been authorized for detection and/or diagnosis of SARS-CoV-2 by FDA under an Emergency Use Authorization (EUA). This EUA will remain in effect (meaning this test can be used) for the duration of the COVID-19 declaration under Section 564(b)(1) of the Act, 21 U.S.C. section 360bbb-3(b)(1), unless the authorization is terminated or revoked.  Performed at [facility], [address] [place], [place] [address]    Sodium 10/11/2020 132 (A)  135 - 145 mmol/L Final   Potassium 10/11/2020 3.5  3.5 - 5.1 mmol/L Final   Chloride 10/11/2020 99  98 - 111 mmol/L Final   CO2 10/11/2020 23  22 - 32 mmol/L Final   Glucose, Bld 10/11/2020 134 (A)  70 - 99 mg/dL Final   Glucose reference range applies only to samples taken after fasting for at least 8 hours.   BUN  10/11/2020 11  6 - 20 mg/dL Final   Creatinine, Ser  10/11/2020 0.78  0.61 - 1.24 mg/dL Final   Calcium 16/12/9602 9.0  8.9 - 10.3 mg/dL Final   Total Protein 54/11/8117 6.9  6.5 - 8.1 g/dL Final   Albumin 14/78/2956 4.2  3.5 - 5.0 g/dL Final   AST 21/30/8657 24  15 - 41 U/L Final   ALT 10/11/2020 21  0 - 44 U/L Final   Alkaline Phosphatase 10/11/2020 77  38 - 126 U/L Final   Total Bilirubin 10/11/2020 1.1  0.3 - 1.2 mg/dL Final   GFR, Estimated 10/11/2020 >60  >60 mL/min Final   Comment: (NOTE) Calculated using the CKD-EPI Creatinine Equation (2021)    Anion gap 10/11/2020 10  5 - 15 Final   Performed at [facility], [address], [place], [place] [address]   Alcohol, Ethyl (B) 10/11/2020 <10  <10 mg/dL Final   Comment: (NOTE) Lowest detectable limit for serum alcohol is 10 mg/dL.  For medical purposes only. Performed at [facility], [address] [place], [place] [address]    Opiates 10/11/2020 NONE DETECTED  NONE DETECTED Final   Cocaine 10/11/2020 NONE DETECTED  NONE DETECTED Final   Benzodiazepines 10/11/2020 NONE DETECTED  NONE DETECTED Final   Amphetamines 10/11/2020 NONE DETECTED  NONE DETECTED Final   Tetrahydrocannabinol 10/11/2020 NONE DETECTED  NONE DETECTED Final   Barbiturates 10/11/2020 NONE DETECTED  NONE DETECTED Final   Comment: (NOTE) DRUG SCREEN FOR MEDICAL PURPOSES ONLY.  IF CONFIRMATION IS NEEDED FOR ANY PURPOSE, NOTIFY LAB WITHIN 5 DAYS.  LOWEST DETECTABLE LIMITS FOR URINE DRUG SCREEN Drug Class                     Cutoff (ng/mL) Amphetamine and metabolites    1000 Barbiturate and metabolites    200 Benzodiazepine                 200 Tricyclics and metabolites     300 Opiates and metabolites        300 Cocaine and metabolites        300 THC                            50 Performed at [facility], [address], [place], [place] [address]    WBC 10/11/2020 10.3  4.0 - 10.5 K/uL Final   RBC 10/11/2020 4.48  4.22 - 5.81  MIL/uL Final   Hemoglobin 10/11/2020 13.7  13.0 - 17.0 g/dL Final   HCT 40/12/2723 39.6  39.0 - 52.0 % Final   MCV 10/11/2020 88.4  80.0 - 100.0 fL Final   MCH 10/11/2020 30.6  26.0 - 34.0 pg Final   MCHC 10/11/2020 34.6  30.0 - 36.0 g/dL Final   RDW 36/64/4034 12.6  11.5 - 15.5 % Final   Platelets 10/11/2020 362  150 - 400 K/uL Final   nRBC 10/11/2020 0.0  0.0 - 0.2 % Final   Neutrophils Relative % 10/11/2020 73  % Final   Neutro Abs 10/11/2020 7.4  1.7 - 7.7 K/uL Final   Lymphocytes Relative 10/11/2020 15  % Final   Lymphs Abs 10/11/2020 1.5  0.7 - 4.0 K/uL Final   Monocytes Relative 10/11/2020 10  % Final   Monocytes Absolute 10/11/2020 1.1 (A)  0.1 - 1.0 K/uL Final   Eosinophils Relative 10/11/2020 2  % Final   Eosinophils Absolute 10/11/2020 0.2  0.0 - 0.5 K/uL Final   Basophils Relative  10/11/2020 0  % Final   Basophils Absolute 10/11/2020 0.0  0.0 - 0.1 K/uL Final   Immature Granulocytes 10/11/2020 0  % Final   Abs Immature Granulocytes 10/11/2020 0.04  0.00 - 0.07 K/uL Final   Performed at [facility], [address] [place], [place] [address]   Salicylate Lvl 10/11/2020 <7.0 (A)  7.0 - 30.0 mg/dL Final   Performed at [facility], [address] [place], [place] [address]   Acetaminophen (Tylenol), Serum 10/11/2020 <10 (A)  10 - 30 ug/mL Final   Comment: (NOTE) Therapeutic concentrations vary significantly. A range of 10-30 ug/mL  may be an effective concentration for many patients. However, some  are best treated at concentrations outside of this range. Acetaminophen concentrations >150 ug/mL at 4 hours after ingestion  and >50 ug/mL at 12 hours after ingestion are often associated with  toxic reactions.  Performed at [facility], [address], [place], [place] [address]   Admission on 10/06/2020, Discharged on 10/07/2020  Component Date Value Ref Range Status   WBC 10/06/2020 7.1  4.0 - 10.5 K/uL Final   RBC 10/06/2020 4.45  4.22 - 5.81 MIL/uL  Final   Hemoglobin 10/06/2020 13.7  13.0 - 17.0 g/dL Final   HCT 18/33/5825 40.3  39.0 - 52.0 % Final   MCV 10/06/2020 90.6  80.0 - 100.0 fL Final   MCH 10/06/2020 30.8  26.0 - 34.0 pg Final   MCHC 10/06/2020 34.0  30.0 - 36.0 g/dL Final   RDW 18/98/4210 12.7  11.5 - 15.5 % Final   Platelets 10/06/2020 286  150 - 400 K/uL Final   nRBC 10/06/2020 0.0  0.0 - 0.2 % Final   Performed at [facility], [address] [place], [place] [address]   Salicylate Lvl 10/06/2020 <7.0 (A)  7.0 - 30.0 mg/dL Final   Performed at [facility], [address] [place], [place] [address]   Acetaminophen (Tylenol), Serum 10/06/2020 <10 (A)  10 - 30 ug/mL Final   Comment: (NOTE) Therapeutic concentrations vary significantly. A range of 10-30 ug/mL  may be an effective concentration for many patients. However, some  are best treated at concentrations outside of this range. Acetaminophen concentrations >150 ug/mL at 4 hours after ingestion  and >50 ug/mL at 12 hours after ingestion are often associated with  toxic reactions.  Performed at [facility], [address] [place], [place] [address]    Sodium 10/06/2020 137  135 - 145 mmol/L Final   Potassium 10/06/2020 3.1 (A)  3.5 - 5.1 mmol/L Final   Chloride 10/06/2020 100  98 - 111 mmol/L Final   CO2 10/06/2020 25  22 - 32 mmol/L Final   Glucose, Bld 10/06/2020 104 (A)  70 - 99 mg/dL Final   Glucose reference range applies only to samples taken after fasting for at least 8 hours.   BUN 10/06/2020 12  6 - 20 mg/dL Final   Creatinine, Ser 10/06/2020 0.62  0.61 - 1.24 mg/dL Final   Calcium 68/15/9470 8.9  8.9 - 10.3 mg/dL Final   Total Protein 76/15/1834 7.5  6.5 - 8.1 g/dL Final   Albumin 37/35/7897 4.9  3.5 - 5.0 g/dL Final   AST 84/78/4128 26  15 - 41 U/L Final   ALT 10/06/2020 31  0 - 44 U/L Final   Alkaline Phosphatase 10/06/2020 60  38 - 126 U/L Final   Total Bilirubin 10/06/2020 1.3 (A)  0.3  - 1.2 mg/dL Final   GFR, Estimated 10/06/2020 >60  >60 mL/min Final   Comment: (NOTE) Calculated using the CKD-EPI Creatinine Equation (2021)    Anion gap 10/06/2020 12  5 - 15 Final   Performed at [facility], [address], [place], [place] [address]   Alcohol, Ethyl (B) 10/06/2020 <10  <10 mg/dL Final   Comment: (NOTE) Lowest detectable limit for serum alcohol is 10 mg/dL.  For medical purposes only. Performed at [facility], [address], [place], [place] [address]    SARS Coronavirus 2 by RT PCR 10/06/2020 NEGATIVE  NEGATIVE Final   Comment: (NOTE) SARS-CoV-2 target nucleic acids are NOT DETECTED.  The SARS-CoV-2 RNA is generally detectable in upper respiratory specimens during the acute phase of infection. The lowest concentration of SARS-CoV-2 viral copies this assay can detect is 138 copies/mL. A negative result does not preclude SARS-Cov-2 infection and should not be used as the sole basis for treatment or other patient management decisions. A negative result may occur with  improper specimen collection/handling, submission of specimen other than nasopharyngeal swab, presence of viral mutation(s) within the areas targeted by this assay, and inadequate number of viral copies(<138 copies/mL). A negative result must be combined with clinical observations, patient history, and epidemiological information. The expected result is Negative.  Fact Sheet for Patients:  BloggerCourse.com  Fact Sheet for Healthcare Providers:  SeriousBroker.it  This test is no                          t yet approved or cleared by the [place] FDA and  has been authorized for detection and/or diagnosis of SARS-CoV-2 by FDA under an Emergency Use Authorization (EUA). This EUA will remain  in effect (meaning this test can be used) for the duration of the COVID-19 declaration under Section 564(b)(1)  of the Act, 21 U.S.C.section 360bbb-3(b)(1), unless the authorization is terminated  or revoked sooner.       Influenza A by PCR 10/06/2020 NEGATIVE  NEGATIVE Final   Influenza B by PCR 10/06/2020 NEGATIVE  NEGATIVE Final   Comment: (NOTE) The Xpert Xpress SARS-CoV-2/FLU/RSV plus assay is intended as an aid in the diagnosis of influenza from Nasopharyngeal swab specimens and should not be used as a sole basis for treatment. Nasal washings and aspirates are unacceptable for Xpert Xpress SARS-CoV-2/FLU/RSV testing.  Fact Sheet for Patients: BloggerCourse.com  Fact Sheet for Healthcare Providers: SeriousBroker.it  This test is not yet approved or cleared by the [place] FDA and has been authorized for detection and/or diagnosis of SARS-CoV-2 by FDA under an Emergency Use Authorization (EUA). This EUA will remain in effect (meaning this test can be used) for the duration of the COVID-19 declaration under Section 564(b)(1) of the Act, 21 U.S.C. section 360bbb-3(b)(1), unless the authorization is terminated or revoked.  Performed at [facility], [address] [place], [place] [address]    Opiates 10/07/2020 NONE DETECTED  NONE DETECTED Final   Cocaine 10/07/2020 NONE DETECTED  NONE DETECTED Final   Benzodiazepines 10/07/2020 NONE DETECTED  NONE DETECTED Final   Amphetamines 10/07/2020 POSITIVE (A)  NONE DETECTED Final   Tetrahydrocannabinol 10/07/2020 NONE DETECTED  NONE DETECTED Final   Barbiturates 10/07/2020 NONE DETECTED  NONE DETECTED Final   Comment: (NOTE) DRUG SCREEN FOR MEDICAL PURPOSES ONLY.  IF CONFIRMATION IS NEEDED FOR ANY PURPOSE, NOTIFY LAB WITHIN 5 DAYS.  LOWEST DETECTABLE LIMITS FOR URINE  DRUG SCREEN Drug Class                     Cutoff (ng/mL) Amphetamine and metabolites    1000 Barbiturate and metabolites    200 Benzodiazepine                 200 Tricyclics and metabolites      300 Opiates and metabolites        300 Cocaine and metabolites        300 THC                            50 Performed at [facility], [address], [place], [place] [address]   Admission on 09/25/2020, Discharged on 10/01/2020  Component Date Value Ref Range Status   Cholesterol 09/27/2020 161  0 - 200 mg/dL Final   Triglycerides 65/78/4696 156 (A)  <150 mg/dL Final   HDL 29/52/8413 52  >40 mg/dL Final   Total CHOL/HDL Ratio 09/27/2020 3.1  RATIO Final   VLDL 09/27/2020 31  0 - 40 mg/dL Final   LDL Cholesterol 09/27/2020 78  0 - 99 mg/dL Final   Comment:        Total Cholesterol/HDL:CHD Risk Coronary Heart Disease Risk Table                     Men   Women  1/2 Average Risk   3.4   3.3  Average Risk       5.0   4.4  2 X Average Risk   9.6   7.1  3 X Average Risk  23.4   11.0        Use the calculated Patient Ratio above and the CHD Risk Table to determine the patient's CHD Risk.        ATP III CLASSIFICATION (LDL):  <100     mg/dL   Optimal  244-010  mg/dL   Near or Above                    Optimal  130-159  mg/dL   Borderline  272-536  mg/dL   High  >644     mg/dL   Very High Performed at [facility], [address], [place], [place] [address]    Hgb A1c MFr Bld 09/27/2020 5.2  4.8 - 5.6 % Final   Comment: (NOTE)         Prediabetes: 5.7 - 6.4         Diabetes: >6.4         Glycemic control for adults with diabetes: <7.0    Mean Plasma Glucose 09/27/2020 103  mg/dL Final   Comment: (NOTE) Performed At: [facility] [address] [place], [place] [id number] Jolene Schimke MD [id number]    WBC 10/01/2020 8.8  4.0 - 10.5 K/uL Final   RBC 10/01/2020 4.64  4.22 - 5.81 MIL/uL Final   Hemoglobin 10/01/2020 14.2  13.0 - 17.0 g/dL Final   HCT 66/08/3014 42.5  39.0 - 52.0 % Final   MCV 10/01/2020 91.6  80.0 - 100.0 fL Final   MCH 10/01/2020 30.6  26.0 - 34.0 pg Final   MCHC 10/01/2020 33.4  30.0 - 36.0 g/dL Final    RDW 03/11/3233 12.9  11.5 - 15.5 % Final   Platelets 10/01/2020 271  150 - 400 K/uL Final   nRBC 10/01/2020 0.0  0.0 - 0.2 % Final   Neutrophils Relative % 10/01/2020 56  % Final   Neutro Abs 10/01/2020 4.9  1.7 - 7.7 K/uL Final   Lymphocytes Relative 10/01/2020 24  % Final   Lymphs Abs 10/01/2020 2.1  0.7 - 4.0 K/uL Final   Monocytes Relative 10/01/2020 9  % Final   Monocytes Absolute 10/01/2020 0.8  0.1 - 1.0 K/uL Final   Eosinophils Relative 10/01/2020 6  % Final   Eosinophils Absolute 10/01/2020 0.5  0.0 - 0.5 K/uL Final   Basophils Relative 10/01/2020 1  % Final   Basophils Absolute 10/01/2020 0.1  0.0 - 0.1 K/uL Final   Immature Granulocytes 10/01/2020 4  % Final   Abs Immature Granulocytes 10/01/2020 0.33 (A)  0.00 - 0.07 K/uL Final   Performed at [facility], [address], [place], [place] [address]   Sodium 10/01/2020 140  135 - 145 mmol/L Final   Potassium 10/01/2020 4.0  3.5 - 5.1 mmol/L Final   Chloride 10/01/2020 105  98 - 111 mmol/L Final   CO2 10/01/2020 22  22 - 32 mmol/L Final   Glucose, Bld 10/01/2020 90  70 - 99 mg/dL Final   Glucose reference range applies only to samples taken after fasting for at least 8 hours.   BUN 10/01/2020 16  6 - 20 mg/dL Final   Creatinine, Ser 10/01/2020 0.67  0.61 - 1.24 mg/dL Final   Calcium 60/45/4098 9.2  8.9 - 10.3 mg/dL Final   Total Protein 11/91/4782 6.9  6.5 - 8.1 g/dL Final   Albumin 95/62/1308 4.4  3.5 - 5.0 g/dL Final   AST 65/78/4696 32  15 - 41 U/L Final   ALT 10/01/2020 31  0 - 44 U/L Final   Alkaline Phosphatase 10/01/2020 51  38 - 126 U/L Final   Total Bilirubin 10/01/2020 0.5  0.3 - 1.2 mg/dL Final   GFR, Estimated 10/01/2020 >60  >60 mL/min Final   Comment: (NOTE) Calculated using the CKD-EPI Creatinine Equation (2021)    Anion gap 10/01/2020 13  5 - 15 Final   Performed at [facility], [address] [place], [place] [address]   Valproic Acid Lvl 10/01/2020 66  50.0 -  100.0 ug/mL Final   Performed at [facility], [address] [place], [place] [address]  Admission on 09/21/2020, Discharged on 09/25/2020  Component Date Value Ref Range Status   Sodium 09/21/2020 139  135 - 145 mmol/L Final   Potassium 09/21/2020 4.0  3.5 - 5.1 mmol/L Final   Chloride 09/21/2020 103  98 - 111 mmol/L Final   CO2 09/21/2020 25  22 - 32 mmol/L Final   Glucose, Bld 09/21/2020 111 (A)  70 - 99 mg/dL Final   Glucose reference range applies only to samples taken after fasting for at least 8 hours.   BUN 09/21/2020 15  6 - 20 mg/dL Final   Creatinine, Ser 09/21/2020 0.95  0.61 - 1.24 mg/dL Final   Calcium 40/12/2723 9.4  8.9 - 10.3 mg/dL Final   Total Protein 36/64/4034 6.8  6.5 - 8.1 g/dL Final   Albumin 74/25/9563 4.4  3.5 - 5.0 g/dL Final   AST 87/56/4332 15  15 - 41 U/L Final   ALT 09/21/2020 13  0 - 44 U/L Final   Alkaline Phosphatase 09/21/2020 62  38 - 126 U/L Final   Total Bilirubin 09/21/2020 0.6  0.3 - 1.2 mg/dL Final   GFR, Estimated 09/21/2020 >60  >  60 mL/min Final   Comment: (NOTE) Calculated using the CKD-EPI Creatinine Equation (2021)    Anion gap 09/21/2020 11  5 - 15 Final   Performed at [facility], [address] [place], [place] [address]   Alcohol, Ethyl (B) 09/21/2020 <10  <10 mg/dL Final   Comment: (NOTE) Lowest detectable limit for serum alcohol is 10 mg/dL.  For medical purposes only. Performed at [facility], [address] [place], [place] [address]    Salicylate Lvl 09/21/2020 <7.0 (A)  7.0 - 30.0 mg/dL Final   Performed at [facility], [address], [place], [place] [address]   Acetaminophen (Tylenol), Serum 09/21/2020 <10 (A)  10 - 30 ug/mL Final   Comment: (NOTE) Therapeutic concentrations vary significantly. A range of 10-30 ug/mL  may be an effective concentration for many patients. However, some  are best treated at concentrations outside of this range. Acetaminophen concentrations >150  ug/mL at 4 hours after ingestion  and >50 ug/mL at 12 hours after ingestion are often associated with  toxic reactions.  Performed at [facility], [address], [place], [place] [address]    WBC 09/21/2020 10.5  4.0 - 10.5 K/uL Final   RBC 09/21/2020 4.58  4.22 - 5.81 MIL/uL Final   Hemoglobin 09/21/2020 13.9  13.0 - 17.0 g/dL Final   HCT 29/56/2130 40.0  39.0 - 52.0 % Final   MCV 09/21/2020 87.3  80.0 - 100.0 fL Final   MCH 09/21/2020 30.3  26.0 - 34.0 pg Final   MCHC 09/21/2020 34.8  30.0 - 36.0 g/dL Final   RDW 86/57/8469 12.3  11.5 - 15.5 % Final   Platelets 09/21/2020 292  150 - 400 K/uL Final   nRBC 09/21/2020 0.0  0.0 - 0.2 % Final   Performed at [facility], [address] [place], [place] [address]   Opiates 09/21/2020 NONE DETECTED  NONE DETECTED Final   Cocaine 09/21/2020 NONE DETECTED  NONE DETECTED Final   Benzodiazepines 09/21/2020 NONE DETECTED  NONE DETECTED Final   Amphetamines 09/21/2020 NONE DETECTED  NONE DETECTED Final   Tetrahydrocannabinol 09/21/2020 NONE DETECTED  NONE DETECTED Final   Barbiturates 09/21/2020 NONE DETECTED  NONE DETECTED Final   Comment: (NOTE) DRUG SCREEN FOR MEDICAL PURPOSES ONLY.  IF CONFIRMATION IS NEEDED FOR ANY PURPOSE, NOTIFY LAB WITHIN 5 DAYS.  LOWEST DETECTABLE LIMITS FOR URINE DRUG SCREEN Drug Class                     Cutoff (ng/mL) Amphetamine and metabolites    1000 Barbiturate and metabolites    200 Benzodiazepine                 200 Tricyclics and metabolites     300 Opiates and metabolites        300 Cocaine and metabolites        300 THC                            50 Performed at [facility], [address] [place], [place] [address]    SARS Coronavirus 2 by RT PCR 09/21/2020 NEGATIVE  NEGATIVE Final   Comment: (NOTE) SARS-CoV-2 target nucleic acids are NOT DETECTED.  The SARS-CoV-2 RNA is generally detectable in upper respiratory specimens during the acute phase of infection. The  lowest concentration of SARS-CoV-2 viral copies this assay can detect is  138 copies/mL. A negative result does not preclude SARS-Cov-2 infection and should not be used as the sole basis for treatment or other patient management decisions. A negative result may occur with  improper specimen collection/handling, submission of specimen other than nasopharyngeal swab, presence of viral mutation(s) within the areas targeted by this assay, and inadequate number of viral copies(<138 copies/mL). A negative result must be combined with clinical observations, patient history, and epidemiological information. The expected result is Negative.  Fact Sheet for Patients:  BloggerCourse.com  Fact Sheet for Healthcare Providers:  SeriousBroker.it  This test is no                          t yet approved or cleared by the [place] FDA and  has been authorized for detection and/or diagnosis of SARS-CoV-2 by FDA under an Emergency Use Authorization (EUA). This EUA will remain  in effect (meaning this test can be used) for the duration of the COVID-19 declaration under Section 564(b)(1) of the Act, 21 U.S.C.section 360bbb-3(b)(1), unless the authorization is terminated  or revoked sooner.       Influenza A by PCR 09/21/2020 NEGATIVE  NEGATIVE Final   Influenza B by PCR 09/21/2020 NEGATIVE  NEGATIVE Final   Comment: (NOTE) The Xpert Xpress SARS-CoV-2/FLU/RSV plus assay is intended as an aid in the diagnosis of influenza from Nasopharyngeal swab specimens and should not be used as a sole basis for treatment. Nasal washings and aspirates are unacceptable for Xpert Xpress SARS-CoV-2/FLU/RSV testing.  Fact Sheet for Patients: BloggerCourse.com  Fact Sheet for Healthcare Providers: SeriousBroker.it  This test is not yet approved or cleared by the [place] FDA and has been authorized for  detection and/or diagnosis of SARS-CoV-2 by FDA under an Emergency Use Authorization (EUA). This EUA will remain in effect (meaning this test can be used) for the duration of the COVID-19 declaration under Section 564(b)(1) of the Act, 21 U.S.C. section 360bbb-3(b)(1), unless the authorization is terminated or revoked.  Performed at [facility], [address], [place], [place] [address]    Color, Urine 09/21/2020 YELLOW  YELLOW Final   APPearance 09/21/2020 CLEAR  CLEAR Final   Specific Gravity, Urine 09/21/2020 1.014  1.005 - 1.030 Final   pH 09/21/2020 7.0  5.0 - 8.0 Final   Glucose, UA 09/21/2020 NEGATIVE  NEGATIVE mg/dL Final   Hgb urine dipstick 09/21/2020 NEGATIVE  NEGATIVE Final   Bilirubin Urine 09/21/2020 NEGATIVE  NEGATIVE Final   Ketones, ur 09/21/2020 NEGATIVE  NEGATIVE mg/dL Final   Protein, ur 60/45/4098 NEGATIVE  NEGATIVE mg/dL Final   Nitrite 11/91/4782 NEGATIVE  NEGATIVE Final   Leukocytes,Ua 09/21/2020 NEGATIVE  NEGATIVE Final   Performed at [facility], [address], [place], [place] [address]   Lithium Lvl 09/23/2020 0.39 (A)  0.60 - 1.20 mmol/L Final   Performed at [facility], [address], [place], [place] [address]   Sodium 09/23/2020 140  135 - 145 mmol/L Final   Potassium 09/23/2020 3.9  3.5 - 5.1 mmol/L Final   Chloride 09/23/2020 105  98 - 111 mmol/L Final   BUN 09/23/2020 14  6 - 20 mg/dL Final   Creatinine, Ser 09/23/2020 0.80  0.61 - 1.24 mg/dL Final   Glucose, Bld 78/46/9629 96  70 - 99 mg/dL Final   Glucose reference range applies only to samples taken after fasting for at least 8 hours.   Calcium, Ion 09/23/2020 1.22  1.15 - 1.40 mmol/L Final   TCO2 09/23/2020 26  22 - 32 mmol/L Final   Hemoglobin 09/23/2020 14.3  13.0 - 17.0 g/dL Final   HCT 16/12/9602 42.0  39.0 - 52.0 % Final   Sodium 09/23/2020 138  135 - 145 mmol/L Final   Potassium 09/23/2020 4.1  3.5 - 5.1 mmol/L Final   Chloride 09/23/2020 105  98 - 111 mmol/L  Final   CO2 09/23/2020 25  22 - 32 mmol/L Final   Glucose, Bld 09/23/2020 99  70 - 99 mg/dL Final   Glucose reference range applies only to samples taken after fasting for at least 8 hours.   BUN 09/23/2020 13  6 - 20 mg/dL Final   Creatinine, Ser 09/23/2020 0.91  0.61 - 1.24 mg/dL Final   Calcium 54/11/8117 9.2  8.9 - 10.3 mg/dL Final   Total Protein 14/78/2956 6.2 (A)  6.5 - 8.1 g/dL Final   Albumin 21/30/8657 4.0  3.5 - 5.0 g/dL Final   AST 84/69/6295 13 (A)  15 - 41 U/L Final   ALT 09/23/2020 11  0 - 44 U/L Final   Alkaline Phosphatase 09/23/2020 49  38 - 126 U/L Final   Total Bilirubin 09/23/2020 0.7  0.3 - 1.2 mg/dL Final   GFR, Estimated 09/23/2020 >60  >60 mL/min Final   Comment: (NOTE) Calculated using the CKD-EPI Creatinine Equation (2021)    Anion gap 09/23/2020 8  5 - 15 Final   Performed at [facility], [address], [place], [place] [address]   WBC 09/23/2020 9.6  4.0 - 10.5 K/uL Final   RBC 09/23/2020 4.84  4.22 - 5.81 MIL/uL Final   Hemoglobin 09/23/2020 14.8  13.0 - 17.0 g/dL Final   HCT 24/40/1027 43.4  39.0 - 52.0 % Final   MCV 09/23/2020 89.7  80.0 - 100.0 fL Final   MCH 09/23/2020 30.6  26.0 - 34.0 pg Final   MCHC 09/23/2020 34.1  30.0 - 36.0 g/dL Final   RDW 25/36/6440 12.4  11.5 - 15.5 % Final   Platelets 09/23/2020 291  150 - 400 K/uL Final   nRBC 09/23/2020 0.0  0.0 - 0.2 % Final   Performed at [facility], [address] [place], [place] [address]   Glucose-Capillary 09/23/2020 100 (A)  70 - 99 mg/dL Final   Glucose reference range applies only to samples taken after fasting for at least 8 hours.   Comment 1 09/23/2020 Notify RN   Final   Troponin I (High Sensitivity) 09/23/2020 3  <18 ng/L Final   Comment: (NOTE) Elevated high sensitivity troponin I (hsTnI) values and significant  changes across serial measurements may suggest ACS but many other  chronic and acute conditions are known to elevate hsTnI results.  Refer to the "Links"  section for chest pain algorithms and additional  guidance. Performed at [facility], [address] [place], [place] [address]    Magnesium 09/23/2020 2.3  1.7 - 2.4 mg/dL Final   Performed at [facility], [address], [place], [place] [address]   Phosphorus 09/23/2020 3.5  2.5 - 4.6 mg/dL Final   Performed at [facility], [address] [place], [place] [address]   TSH 09/23/2020 1.957  0.350 - 4.500 uIU/mL Final   Comment: Performed by a 3rd Generation assay with a functional sensitivity of <=0.01 uIU/mL. Performed at [facility], [address] [place], [place] [address]  T3, Free 09/23/2020 3.3  2.0 - 4.4 pg/mL Final   Comment: (NOTE) Performed At: [facility] [address] [place], [place] [id number] Jolene Schimke MD JX:[id number]    SARS Coronavirus 2 09/24/2020 NEGATIVE  NEGATIVE Final   Comment: (NOTE) SARS-CoV-2 target nucleic acids are NOT DETECTED.  The SARS-CoV-2 RNA is generally detectable in upper and lower respiratory specimens during the acute phase of infection. Negative results do not preclude SARS-CoV-2 infection, do not rule out co-infections with other pathogens, and should not be used as the sole basis for treatment or other patient management decisions. Negative results must be combined with clinical observations, patient history, and epidemiological information. The expected result is Negative.  Fact Sheet for Patients: HairSlick.no  Fact Sheet for Healthcare Providers: quierodirigir.com  This test is not yet approved or cleared by the [place] FDA and  has been authorized for detection and/or diagnosis of SARS-CoV-2 by FDA under an Emergency Use Authorization (EUA). This EUA will remain  in effect (meaning this test can be used) for the duration of the COVID-19 declaration under Se                          ction 564(b)(1) of the Act, 21  U.S.C. section 360bbb-3(b)(1), unless the authorization is terminated or revoked sooner.  Performed at [facility], [address], [place], [place] [address]     Blood Alcohol level:  Lab Results  Component Value Date   ETH 325 [facility]) 01/12/2021   ETH <10 11/13/2020    Metabolic Disorder Labs: Lab Results  Component Value Date   HGBA1C 5.2 09/27/2020   MPG 103 09/27/2020   MPG 102.54 01/24/2020   No results found for: PROLACTIN Lab Results  Component Value Date   CHOL 198 01/12/2021   TRIG 281 (H) 01/12/2021   HDL 76 01/12/2021   CHOLHDL 2.6 01/12/2021   VLDL 56 (H) 01/12/2021   LDLCALC 66 01/12/2021   LDLCALC 78 09/27/2020    Therapeutic Lab Levels: Lab Results  Component Value Date   LITHIUM 0.39 (L) 09/23/2020   LITHIUM 0.38 (L) 01/27/2020   Lab Results  Component Value Date   VALPROATE 29 (L) 10/12/2020   VALPROATE 66 10/01/2020   No components found for:  CBMZ  Physical Findings   AIMS    Flowsheet Row [facility]  AIMS Total Score 0      AUDIT    Flowsheet Row [facility] Ad[facility]  Alcohol Use Disorder Identification Test Final Score (AUDIT) 0 8      Flowsheet Row ED from 01/13/2021 in [facility] from [facility]/12/2021 in [facility] from 11/14/2020 in [facility]  C-SSRS RISK CATEGORY High Risk High Risk High Risk        Musculoskeletal  Strength & Muscle Tone: within normal limits Gait & Station: normal Patient leans: N/A  Psychiatric Specialty Exam  Presentation  General Appearance: Appropriate for Environment; Other (comment) (dressed in hospital scrubs, well kempt beard, shaved head)  Eye  Contact:Good  Speech:Clear and Coherent; Normal Rate  Speech Volume:Normal  Handedness:Right   Mood and Affect  Mood:Depressed  Affect:Appropriate; Congruent; Tearful; Depressed   Thought Process  Thought Processes:Coherent; Linear  Descriptions of Associations:Intact  Orientation:Full (Time,  Place and Person)  Thought Content:Rumination; Logical (ruminating on substance use and desire to get sober)  Diagnosis of Schizophrenia or Schizoaffective disorder in past: No    Hallucinations:Hallucinations: None Description of Auditory Hallucinations: none  Ideas of Reference:None  Suicidal Thoughts:Suicidal Thoughts: No SI Active Intent and/or Plan: With Plan; With Means to Carry Out; With Intent  Homicidal Thoughts:Homicidal Thoughts: No   Sensorium  Memory:Immediate Good; Recent Good  Judgment:Good  Insight:Good (good insight into substance use)   Executive Functions  Concentration:Good  Attention Span:Good  Recall:Good  Fund of Knowledge:Good  Language:Good   Psychomotor Activity  Psychomotor Activity:Psychomotor Activity: Tremor (mild tremor in BUE with outstretched arms)   Assets  Assets:Desire for Improvement; Physical Health; Social Support; Resilience; Vocational/Educational   Sleep  Sleep:Sleep: Fair Number of Hours of Sleep: -1   Nutritional Assessment (For OBS and [facility] admissions only) Has the patient had a weight loss or gain of 10 pounds or more in the last 3 months?: No Has the patient had a decrease in food intake/or appetite?: No Does the patient have dental problems?: No Does the patient have eating habits or behaviors that may be indicators of an eating disorder including binging or inducing vomiting?: No Has the patient recently lost weight without trying?: 0 Has the patient been eating poorly because of a decreased appetite?: 0 Malnutrition Screening Tool Score: 0   Physical Exam  Physical Exam Constitutional:       Appearance: Normal appearance. He is normal weight.  HENT:     Head: Normocephalic and atraumatic.  Eyes:     Extraocular Movements: Extraocular movements intact.  Pulmonary:     Effort: Pulmonary effort is normal.  Neurological:     General: No focal deficit present.     Mental Status: He is alert and oriented to person, place, and time.  Psychiatric:        Attention and Perception: Attention and perception normal.        Speech: Speech normal.        Behavior: Behavior normal. Behavior is cooperative.        Thought Content: Thought content normal.   Review of Systems  Constitutional:  Positive for diaphoresis. Negative for chills and fever.  HENT:  Negative for hearing loss.   Eyes:  Negative for discharge and redness.  Respiratory:  Negative for cough and shortness of breath.   Cardiovascular:  Negative for chest pain.  Gastrointestinal:  Positive for diarrhea. Negative for abdominal pain, nausea and vomiting.  Musculoskeletal:  Negative for myalgias.  Neurological:  Positive for tremors and headaches.  Psychiatric/Behavioral:  Positive for depression and substance abuse. Negative for hallucinations and suicidal ideas. The patient has insomnia.   Blood pressure 120/74, pulse 81, temperature 98.7 F (37.1 C), temperature source Tympanic, resp. rate 18, SpO2 98 %. There is no height or weight on file to calculate BMI.  Treatment Plan Summary: Callum Wolf is a 35 yo male w/ PMH of Bipolar Type I, substance use, and alcohol use in the [facility] following a suicide attempt via OD on 11/12 and alcohol detoxification. He was transferred to the [facility] on 11/13 for further treatment.  COWS 2; CIWA was 0.  Patient tolerating Ativan taper well although reporting some opiate/alcohol withdrawal symptoms.  Will increase Prozac to 40 mg today.  Patient remains appropriate for continued treatment at the [facility] for continued detox.    Alcohol Withdrawal -Continue lorazepam taper- ativan 1 mg QID, followed  by 1 mg TID, 1 mg  BID, and 1 mg daily x 1 -Hydroxyzine 25mg  q6hrs PRN for anxiety/agitation -Ondansetron 4mg  PRN for nausea -Thiamine 100mg  daily -Trazodone 50mg  PRN for sleep   Bipolar Type I Disorder -Olanzapine 10mg   -Fluoxetine 20 mg increased to 40 mg starting 11/16   Tobacco Use -Nicoderm CQ 21mg  patch    Hypokalemia -K on admission 3.1 -started KCl <MEASUREM T> tablet daily x 5 days (on day 2) -will order CMP on 11/16 to monitor potassium   Opioid use disorder -patient reported using fentanyl prior to admission as a SA; however, UDS negative. -Opiate Withdrawal Protocol: -Clonidine 0.1mg  PO q4hr PRN for withdrawal associated HTN -Bentyl 10mg  PO q6hr PRN for abdominal muscle cramps -Loperamide 2mg  PO q6hr PRN for diarrhea -Robaxin 750mg  PO q8hr PRN for muscle spasm -Zofran 4mg  SL q8hr PRN for nausea -Vistaril 25mg  PO q8hr PRN for anxiety  Asthma -PRN albuterol inhaler    Disposition: ongoing. Pt to continue alcohol withdrawal treatment through 11/17. Likely discharge to  short-term alcohol treatment facility on discharge-[facility], MD 01/14/2021 3:34 PM

## 2021-01-15 NOTE — ED Notes (Signed)
Patient denies SI,HI,AVH. evening medication administered without difficulty to patient.  Respiratory is even and unlabored. No distress noted. Pt is resting in bed at present. Patient stated no complaints at present. will continue to monitor for safety.

## 2021-01-15 NOTE — Group Note (Signed)
Group Topic: Wellness  Group Date: 01/15/2021 Start Time: 1000 End Time: 1030 Facilitators: Levander Campion  Department: [facility]  Number of Participants: 1  Group Focus: daily focus and self-awareness Treatment Modality:  Individual Therapy Interventions utilized were patient education Purpose: enhance coping skills  Name: Brett House Date of Birth: [birth year]  MR: [id number]    Level of Participation: active Quality of Participation: attentive and cooperative Interactions with others: gave feedback Mood/Affect: appropriate Triggers (if applicable): n/a Cognition: coherent/clear Progress: Moderate Response: n/a Plan: follow-up needed  Patients Problems:  Patient Active Problem List   Diagnosis Date Noted   Bipolar 1 disorder (HCC) 11/14/2020   Bipolar 1 disorder, depressed, severe (HCC) 10/12/2020   Suicidal thoughts    Substance induced mood disorder (HCC) 10/07/2020   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

## 2021-01-15 NOTE — ED Notes (Signed)
Pt is in bed sleeping. Respirations are even and unlabored. No acute distress noted. Will continue to monitor for safety. 

## 2021-01-15 NOTE — Progress Notes (Signed)
Pt's COWS was 6

## 2021-01-15 NOTE — ED Notes (Signed)
Patient declined to attend wrap up group and AA group, due to him not wanting to get out of bed. Staff talked with patient on the importance of attending group. Patient still declined.

## 2021-01-15 NOTE — ED Notes (Signed)
Pt is currently sleeping, no distress noted, environmental check complete, will continue to monitor patient for safety. ? ?

## 2021-01-15 NOTE — ED Notes (Signed)
Pt is sleeping in bed at present. Respirations are even and unlabored. No acute distress noted. Will continue to monitor for safety.

## 2021-01-15 NOTE — Progress Notes (Signed)
Pt is asleep. Respirations are even and unlabored. No signs of acute distress noted. Monitoring for pt's safety.

## 2021-01-15 NOTE — ED Notes (Signed)
Snacks given 

## 2021-01-16 DIAGNOSIS — F419 Anxiety disorder, unspecified: Secondary | ICD-10-CM | POA: Diagnosis not present

## 2021-01-16 DIAGNOSIS — F1290 Cannabis use, unspecified, uncomplicated: Secondary | ICD-10-CM | POA: Diagnosis not present

## 2021-01-16 DIAGNOSIS — R45851 Suicidal ideations: Secondary | ICD-10-CM | POA: Diagnosis not present

## 2021-01-16 DIAGNOSIS — F314 Bipolar disorder, current episode depressed, severe, without psychotic features: Secondary | ICD-10-CM | POA: Diagnosis not present

## 2021-01-16 LAB — BASIC METABOLIC PANEL
Anion gap: 8 (ref 5–15)
BUN: 11 mg/dL (ref 6–20)
CO2: 27 mmol/L (ref 22–32)
Calcium: 9 mg/dL (ref 8.9–10.3)
Chloride: 101 mmol/L (ref 98–111)
Creatinine, Ser: 0.73 mg/dL (ref 0.61–1.24)
GFR, Estimated: >60 mL/min (ref >60)
Glucose, Bld: 114 mg/dL — ABNORMAL HIGH (ref 70–99)
Potassium: 4.5 mmol/L (ref 3.5–5.1)
Sodium: 136 mmol/L (ref 135–145)

## 2021-01-16 MED ORDER — ADULT MULTIVITAMIN W/MINERALS CH
1.0000 | ORAL_TABLET | Freq: Every day | ORAL | 1 refills | Status: DC
Start: 2021-01-17 — End: 2021-04-08

## 2021-01-16 MED ORDER — FLUOXETINE HCL 40 MG PO CAPS: 40.0000 mg | ORAL_CAPSULE | Freq: Every day | ORAL | 1 refills | Status: DC

## 2021-01-16 MED ORDER — TRAZODONE HCL 50 MG PO TABS: 50.0000 mg | ORAL_TABLET | Freq: Every evening | ORAL | 1 refills | Status: DC | PRN

## 2021-01-16 MED ORDER — NICOTINE 21 MG/24HR TD PT24
21.0000 mg | MEDICATED_PATCH | Freq: Once | TRANSDERMAL | Status: DC
  Administered 2021-01-16: 21 mg via TRANSDERMAL
  Filled 2021-01-16: qty 1

## 2021-01-16 MED ORDER — NICOTINE 21 MG/24HR TD PT24: 21.0000 mg | MEDICATED_PATCH | Freq: Every day | TRANSDERMAL | 1 refills | Status: DC

## 2021-01-16 MED ORDER — THIAMINE HCL 100 MG PO TABS: 100.0000 mg | ORAL_TABLET | Freq: Every day | ORAL | 1 refills | Status: DC

## 2021-01-16 MED ORDER — ALBUTEROL SULFATE HFA 108 (90 BASE) MCG/ACT IN AERS: 1.0000 | INHALATION_SPRAY | RESPIRATORY_TRACT | 1 refills | Status: DC | PRN

## 2021-01-16 MED ORDER — OLANZAPINE 10 MG PO TABS: 10.0000 mg | ORAL_TABLET | Freq: Every day | ORAL | 1 refills | Status: DC

## 2021-01-16 MED ORDER — HYDROXYZINE HCL 25 MG PO TABS
25.0000 mg | ORAL_TABLET | Freq: Four times a day (QID) | ORAL | 1 refills | Status: DC | PRN
Start: 2021-01-16 — End: 2021-04-08

## 2021-01-16 NOTE — Discharge Instructions (Addendum)
Patient is instructed prior to discharge to: Take all medications as prescribed by his/her mental healthcare provider. Report any adverse effects and or reactions from the medicines to his/her outpatient provider promptly. Patient has been instructed & cautioned: To not engage in alcohol and or illegal drug use while on prescription medicines. In the event of worsening symptoms, patient is instructed to call the crisis hotline, 911 and or go to the nearest ED for appropriate evaluation and treatment of symptoms. To follow-up with his/her primary care provider for your other medical issues, concerns and or health care needs.     Please come to [facility] (this facility) during walk in hours for appointment with psychiatrist for further medication management and for therapists for therapy.    Walk in hours are 8-11 AM Monday through Thursday for medication management. Therapy walk in hours are Monday-Wednesday 8 AM-1PM.   It is first come, first -serve; it is best to arrive by 7:00 AM.   On Friday from 1 pm to 4 pm for therapy intake only. Please arrive by 12:00 pm as it is  first come, first -serve.    When you arrive please go upstairs for your appointment. If you are unsure of where to go, inform the front desk that you are here for a walk in appointment and they will assist you with directions upstairs.  Address:  [address], in [place], [address] Ph: [phone]   

## 2021-01-16 NOTE — Group Note (Signed)
Group Topic: Balance in Life  Group Date: 01/16/2021 Start Time: 1000 End Time: 1045 Facilitators: Levander Campion  Department: [facility]  Number of Participants: 3  Group Focus: personal responsibility Treatment Modality:  Interpersonal Therapy Interventions utilized were patient education Purpose: enhance coping skills  Name: Brett House Date of Birth: [birth year]  MR: [id number]    Level of Participation: active Quality of Participation: attentive Interactions with others: gave feedback Mood/Affect: appropriate Triggers (if applicable): n/a Cognition: coherent/clear and concrete Progress: Moderate Response: n/a Plan: follow-up needed  Patients Problems:  Patient Active Problem List   Diagnosis Date Noted   Bipolar 1 disorder (HCC) 11/14/2020   Bipolar 1 disorder, depressed, severe (HCC) 10/12/2020   Suicidal thoughts    Substance induced mood disorder (HCC) 10/07/2020   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

## 2021-01-16 NOTE — ED Notes (Signed)
Patient denies SI,HI,AVH. Informed patient to notify staff of any impulses to hurt self. Morning medication administered without difficulty to patient.  Respiratory is even and unlabored. No distress noted. Pt is watching TV at present. Patient stated he feels a little jittery.  Will continue to monitor for safety.

## 2021-01-16 NOTE — ED Notes (Signed)
Patient received AVS. Patient questions answered. Patient understands he will report to [facility] on Friday 11/18.

## 2021-01-16 NOTE — ED Notes (Signed)
Pt requested to discharge today due to having court tomorrow and unable to change date. MD and SW made aware of pt's request to discharge. Pt states, "I can't get into [facility] until I resolve this matter so I need to go today". Support provided. Pt received Vistaril due to increased anxiety about discharging today and going to court tomorrow. BP 145/100. Denies SI/HI/AVH or withdrawal sx from opioids. Will continue to monitor for safety.

## 2021-01-16 NOTE — ED Notes (Signed)
Patient discharged with medication samples and prescriptions. Patient received personal belongings. Patient has been discharged home with travel provided by safe transport.

## 2021-01-16 NOTE — ED Notes (Signed)
Snacks given 

## 2021-01-16 NOTE — ED Notes (Signed)
Pt is in the bed sleeping. Respirations are even and unlabored. No acute distress noted. Will continue to monitor for safety. 

## 2021-01-16 NOTE — ED Provider Notes (Signed)
FBC/OBS ASAP Discharge Summary  Date and Time: 01/16/2021 3:00 PM  Name: Brett House  MRN:  [id number]   Discharge Diagnoses:  Final diagnoses:  Bipolar I disorder (HCC)  Suicide attempt (HCC)  Alcohol use disorder, severe, dependence (HCC)    Subjective: Patient seen and chart reviewed.  He has been medication compliant and has not been a management issue on the unit.  Patient interviewed in his room this afternoon.  Patient appears anxious and immediately states that he "may have to go today".  Patient goes on to state that he has a court date tomorrow at 8:30 AM which is already been pushed back once.  Patient states that his friend is reaching out to the DA to see if he can get it pushed back again; however, patient states that his court date was previously pushed back and does not think he will be pushed back again.  Patient denies SI/HI/AVH.  He describes his mood as "all right".  He reports sleeping well for the first time last night during his stay at the [facility] and states she got approximately 8 hours of sleep and feels well rested.  Patient denies alcohol withdrawal symptoms of tremors, nausea, vomiting, GI upset, headache.  Patient received a phone call shortly thereafter that informed him that his court date was unable to be moved back.  Patient requested discharge.  Patient has 1 remaining dose of his Ativan taper labs for tomorrow morning.  Discussed with patient that he has had 2 days left that he will not be discharged with.  Patient verbalized understanding and stated that he wanted to attend his court date tomorrow at 8:30 AM as if he did not, he would be arrested.  Declined to remain in the [facility] until tomorrow morning to receive last use then to be discharged to attend the court hearing.  Stay Summary:  Brett House is a 35 yo male w/ PMH of Bipolar Type I, substance use, and alcohol use in the [facility] following a suicide attempt via OD on 11/12 and alcohol detoxification. He was transferred  to the [facility] [place] on 11/13 for further treatment.  On admission to [facility], patient was started on Ativan taper-1 mg Ativan 4 times daily followed by 1 mg Ativan 3 times daily followed by 1 mg Ativan twice daily followed by 1 mg months.  Patient was also restarted on his home medications of Prozac 20 mg and olanzapine 10 mg as well as albuterol inhaler.  Prozac was increased to 40 mg on 11/16 as patient had reported that he did well previously on a dose as high as 80 mg.  Routine labs revealed hypokalemia with 3.1 on admission.  Patient received 20 mEq for 3 days.  BMP reordered on day of discharge and potassium had improved to 4.5.  Patient was medication compliant throughout stay, he was appropriate with staff and peers on the unit and mostly participating in group activities.  On 11/16 patient requested to leave as he had a court date on the morning of 11/17.  Patient had 1 remaining dose of 1 mg of Ativan left scheduled for the morning of 11/17.  Patient was denying alcohol withdrawal symptoms and CIWA had been persistently 0.  Discussed with patient that he would not receive last dose of Ativan taper, patient verbalized understanding and reported he was ready for discharge.  Patient did not meet criteria for IVC and therefore was discharged per his request.  Patient was accepted today [facility] on Friday on 11/18.  Patient was provided with 14-day samples as well as 30-day prescription with 1 refill in anticipation that he would present to rehab after attending court date.  Total Time spent with patient: 15 minutes  Past Psychiatric History: bipolar disorder, substance use Past Medical History:  Past Medical History:  Diagnosis Date   Anxiety    Asthma    Bipolar disorder (HCC)    Depression    No past surgical history on file. Family History: No family history on file. Family Psychiatric History: Per chart review, Patient reported to relatives with bipolar disorder and/or schizophrenia. Social History:  Social  History   Substance and Sexual Activity  Alcohol Use Yes   Comment: 6 pack once a week for last 8-9 months     Social History   Substance and Sexual Activity  Drug Use Yes   Types: Marijuana   Comment: rarely    Social History   Socioeconomic History   Marital status: Single    Spouse name: Not on file   Number of children: 0   Years of education: Not on file   Highest education level: 12th grade  Occupational History   Occupation: Unemployed  Tobacco Use   Smoking status: Every Day    Packs/day: 1.50    Years: 15.00    Pack years: 22.50    Types: Cigarettes   Smokeless tobacco: Current  Vaping Use   Vaping Use: Never used  Substance and Sexual Activity   Alcohol use: Yes    Comment: 6 pack once a week for last 8-9 months   Drug use: Yes    Types: Marijuana    Comment: rarely   Sexual activity: Yes  Other Topics Concern   Not on file  Social History Narrative   Not on file   Social Determinants of Health   Financial Resource Strain: Not on file  Food Insecurity: Not on file  Transportation Needs: Not on file  Physical Activity: Not on file  Stress: Not on file  Social Connections: Not on file   SDOH:  SDOH Screenings   Alcohol Screen: Low Risk    Last Alcohol Screening Score (AUDIT): 0  Depression (PHQ2-9): Not on file  Financial Resource Strain: Not on file  Food Insecurity: Not on file  Housing: Not on file  Physical Activity: Not on file  Social Connections: Not on file  Stress: Not on file  Tobacco Use: High Risk   Smoking Tobacco Use: Every Day   Smokeless Tobacco Use: Current   Passive Exposure: Not on file  Transportation Needs: Not on file    Tobacco Cessation:  A prescription for an FDA-approved tobacco cessation medication provided at discharge  Current Medications:  Current Facility-Administered Medications  Medication Dose Route Frequency Provider Last Rate Last Admin   acetaminophen (TYLENOL) tablet 650 mg  650 mg Oral Q6H PRN  Bobbitt, Shalon E, NP       albuterol (VENTOLIN HFA) 108 (90 Base) MCG/ACT inhaler 1-2 puff  1-2 puff Inhalation Q4H PRN White, Patrice L, NP       alum & mag hydroxide-simeth (MAALOX/MYLANTA) 200-200-20 MG/5ML suspension 30 mL  30 mL Oral Q4H PRN Bobbitt, Shalon E, NP       dicyclomine (BENTYL) tablet 20 mg  20 mg Oral Q6H PRN Estella Husk, MD       FLUoxetine (PROZAC) capsule 40 mg  40 mg Oral Daily Estella Husk, MD   40 mg at 01/16/21 0916   folic acid (FOLVITE)  tablet 1 mg  1 mg Oral Daily Bobbitt, Shalon E, NP   1 mg at 01/16/21 0916   hydrOXYzine (ATARAX/VISTARIL) tablet 25 mg  25 mg Oral Q6H PRN Estella Husk, MD   25 mg at 01/16/21 1407   loperamide (IMODIUM) capsule 2-4 mg  2-4 mg Oral PRN Estella Husk, MD   2 mg at 01/15/21 1518   [START ON 01/17/2021] LORazepam (ATIVAN) tablet 1 mg  1 mg Oral Daily Bobbitt, Shalon E, NP       magnesium hydroxide (MILK OF MAGNESIA) suspension 30 mL  30 mL Oral Daily PRN Bobbitt, Shalon E, NP       methocarbamol (ROBAXIN) tablet 500 mg  500 mg Oral Q8H PRN Estella Husk, MD       multivitamin with minerals tablet 1 tablet  1 tablet Oral Daily Bobbitt, Shalon E, NP   1 tablet at 01/16/21 0916   naproxen (NAPROSYN) tablet 500 mg  500 mg Oral BID PRN Estella Husk, MD       nicotine (NICODERM CQ - dosed in mg/24 hours) patch 21 mg  21 mg Transdermal Q0600 Bobbitt, Shalon E, NP   21 mg at 01/16/21 0555   nicotine (NICODERM CQ - dosed in mg/24 hours) patch 21 mg  21 mg Transdermal Once Estella Husk, MD   21 mg at 01/16/21 1253   OLANZapine (ZYPREXA) tablet 10 mg  10 mg Oral QHS White, Patrice L, NP   10 mg at 01/15/21 2133   ondansetron (ZOFRAN-ODT) disintegrating tablet 4 mg  4 mg Oral Q6H PRN Estella Husk, MD       thiamine tablet 100 mg  100 mg Oral Daily Bobbitt, Shalon E, NP   100 mg at 01/16/21 0916   traZODone (DESYREL) tablet 50 mg  50 mg Oral QHS PRN Bobbitt, Shalon E, NP   50 mg at 01/15/21  2143   Current Outpatient Medications  Medication Sig Dispense Refill   albuterol (VENTOLIN HFA) 108 (90 Base) MCG/ACT inhaler Inhale 1-2 puffs into the lungs every 4 (four) hours as needed for wheezing or shortness of breath. 1 each 1   [START ON 01/17/2021] FLUoxetine (PROZAC) 40 MG capsule Take 1 capsule (40 mg total) by mouth daily. 30 capsule 1   hydrOXYzine (ATARAX/VISTARIL) 25 MG tablet Take 1 tablet (25 mg total) by mouth every 6 (six) hours as needed for anxiety. 30 tablet 1   [START ON 01/17/2021] Multiple Vitamin (MULTIVITAMIN WITH MINERALS) TABS tablet Take 1 tablet by mouth daily. 30 tablet 1   [START ON 01/17/2021] nicotine (NICODERM CQ - DOSED IN MG/24 HOURS) 21 mg/24hr patch Place 1 patch (21 mg total) onto the skin daily at 6 (six) AM. 28 patch 1   OLANZapine (ZYPREXA) 10 MG tablet Take 1 tablet (10 mg total) by mouth at bedtime. 30 tablet 1   [START ON 01/17/2021] thiamine 100 MG tablet Take 1 tablet (100 mg total) by mouth daily. 30 tablet 1   traZODone (DESYREL) 50 MG tablet Take 1 tablet (50 mg total) by mouth at bedtime as needed for sleep. 30 tablet 1    PTA Medications: (Not in a hospital admission)   Musculoskeletal  Strength & Muscle Tone: within normal limits Gait & Station: normal Patient leans: N/A  Psychiatric Specialty Exam  Presentation  General Appearance: Appropriate for Environment; Casual  Eye Contact:Good  Speech:Clear and Coherent; Normal Rate  Speech Volume:Normal  Handedness:Right   Mood and Affect  Mood:-- ("alright")  Affect:Appropriate; Congruent   Thought Process  Thought Processes:Coherent; Goal Directed; Linear  Descriptions of Associations:Intact  Orientation:Full (Time, Place and Person)  Thought Content:WDL; Logical  Diagnosis of Schizophrenia or Schizoaffective disorder in past: No    Hallucinations:Hallucinations: None  Ideas of Reference:None  Suicidal Thoughts:Suicidal Thoughts: No  Homicidal  Thoughts:Homicidal Thoughts: No   Sensorium  Memory:Immediate Good; Recent Good; Remote Good  Judgment:Good  Insight:Good (good insight into substance use and seekign treatment)   Executive Functions  Concentration:Good  Attention Span:Good  Recall:Good  Fund of Knowledge:Good  Language:Good   Psychomotor Activity  Psychomotor Activity:Psychomotor Activity: Normal   Assets  Assets:Communication Skills; Desire for Improvement; Physical Health; Resilience   Sleep  Sleep:Sleep: Fair   No data recorded  Physical Exam  Physical Exam Constitutional:      Appearance: Normal appearance. He is normal weight.  HENT:     Head: Normocephalic and atraumatic.  Eyes:     Extraocular Movements: Extraocular movements intact.  Pulmonary:     Effort: Pulmonary effort is normal.  Neurological:     General: No focal deficit present.     Mental Status: He is alert and oriented to person, place, and time.  Psychiatric:        Attention and Perception: Attention and perception normal.        Speech: Speech normal.        Behavior: Behavior normal. Behavior is cooperative.        Thought Content: Thought content normal.   Review of Systems  Constitutional:  Negative for chills and fever.  HENT:  Negative for hearing loss.   Eyes:  Negative for discharge and redness.  Respiratory:  Negative for cough.   Cardiovascular:  Negative for chest pain.  Gastrointestinal:  Negative for abdominal pain.  Musculoskeletal:  Negative for myalgias.  Neurological:  Negative for headaches.  Psychiatric/Behavioral:  Positive for substance abuse. Negative for depression, hallucinations and suicidal ideas.   Blood pressure (!) 145/100, pulse 99, temperature 98.3 F (36.8 C), temperature source Oral, resp. rate 18, SpO2 99 %. There is no height or weight on file to calculate BMI.  Demographic Factors:  Male, Caucasian, and Low socioeconomic status  Loss Factors: Legal issues and Financial  problems/change in socioeconomic status  Historical Factors: Prior suicide attempts and Impulsivity  Risk Reduction Factors:   Positive coping skills or problem solving skills and future focused (looking forward to receiving substance treatment and then hoping to go to a half way house) , family  Continued Clinical Symptoms:  Bipolar Disorder:   Depressive phase Alcohol/Substance Abuse/Dependencies Previous Psychiatric Diagnoses and Treatments  Cognitive Features That Contribute To Risk:  None    Suicide Risk:  Minimal: No identifiable suicidal ideation.  Patients presenting with no risk factors but with morbid ruminations; may be classified as minimal risk based on the severity of the depressive symptoms  Plan Of Care/Follow-up recommendations:  Activity:  as tolerated Diet:  regular Other:     Patient is instructed prior to discharge to: Take all medications as prescribed by his/her mental healthcare provider. Report any adverse effects and or reactions from the medicines to his/her outpatient provider promptly. Patient has been instructed & cautioned: To not engage in alcohol and or illegal drug use while on prescription medicines. In the event of worsening symptoms, patient is instructed to call the crisis hotline, 911 and or go to the nearest ED for appropriate evaluation and treatment of symptoms. To follow-up with his/her primary care provider for your  other medical issues, concerns and or health care needs.    TAKE these medications    albuterol 108 (90 Base) MCG/ACT inhaler Commonly known as: VENTOLIN HFA Inhale 1-2 puffs into the lungs every 4 (four) hours as needed for wheezing or shortness of breath.   FLUoxetine 40 MG capsule Commonly known as: PROZAC Take 1 capsule (40 mg total) by mouth daily. Start taking on: January 17, 2021 What changed:  medication strength how much to take   hydrOXYzine 25 MG tablet Commonly known as: ATARAX/VISTARIL Take 1 tablet  (25 mg total) by mouth every 6 (six) hours as needed for anxiety. What changed:  when to take this reasons to take this   multivitamin with minerals Tabs tablet Take 1 tablet by mouth daily. Start taking on: January 17, 2021   nicotine 21 mg/24hr patch Commonly known as: NICODERM CQ - dosed in mg/24 hours Place 1 patch (21 mg total) onto the skin daily at 6 (six) AM. Start taking on: January 17, 2021 What changed: when to take this   OLANZapine 10 MG tablet Commonly known as: ZYPREXA Take 1 tablet (10 mg total) by mouth at bedtime. What changed: Another medication with the same name was removed. Continue taking this medication, and follow the directions you see here.   thiamine 100 MG tablet Take 1 tablet (100 mg total) by mouth daily. Start taking on: January 17, 2021   traZODone 50 MG tablet Commonly known as: DESYREL Take 1 tablet (50 mg total) by mouth at bedtime as needed for sleep. What changed:  when to take this reasons to take this       Patient provided with 14-day samples of above medications.  Patient was also provided paper scripts for 30 days worth with the above medications with 1 refill.  See AVS for additional resources.  Patient was acceped to day [facility] for treatment on 11/18.  Patient was informed of this by social work prior to his discharge.  Requested discharge today in order to attend court date tomorrow 11/17 at 8:30 AM  Disposition: self care  Estella Husk, MD 01/16/2021, 3:00 PM

## 2021-01-16 NOTE — ED Notes (Signed)
Received patient this AM. Patient in his bed sleeping. Patient respirations even and unlabored.

## 2021-01-17 ENCOUNTER — Telehealth (HOSPITAL_COMMUNITY): Payer: Self-pay

## 2021-01-17 NOTE — BH Assessment (Signed)
Care Management - Follow Up [facility] Discharges   Writer attempted to make contact with patient today and was unsuccessful.  Writer left a HIPPA compliant voice message.   Per chart review, pt was provided with substance abuse resources,

## 2021-04-04 ENCOUNTER — Emergency Department (HOSPITAL_COMMUNITY)
Admission: EM | Admit: 2021-04-04 | Discharge: 2021-04-04 | Disposition: A | Discharge status: Other Acute Inpatient Hospital | Payer: Self-pay | Attending: Emergency Medicine | Admitting: Emergency Medicine

## 2021-04-04 ENCOUNTER — Other Ambulatory Visit: Payer: Self-pay

## 2021-04-04 DIAGNOSIS — F14988 Cocaine use, unspecified with other cocaine-induced disorder: Secondary | ICD-10-CM | POA: Insufficient documentation

## 2021-04-04 DIAGNOSIS — F12988 Cannabis use, unspecified with other cannabis-induced disorder: Secondary | ICD-10-CM | POA: Insufficient documentation

## 2021-04-04 DIAGNOSIS — F319 Bipolar disorder, unspecified: Secondary | ICD-10-CM | POA: Insufficient documentation

## 2021-04-04 DIAGNOSIS — Z20822 Contact with and (suspected) exposure to covid-19: Secondary | ICD-10-CM | POA: Insufficient documentation

## 2021-04-04 DIAGNOSIS — R45851 Suicidal ideations: Secondary | ICD-10-CM

## 2021-04-04 DIAGNOSIS — F1910 Other psychoactive substance abuse, uncomplicated: Secondary | ICD-10-CM

## 2021-04-04 DIAGNOSIS — F418 Other specified anxiety disorders: Secondary | ICD-10-CM | POA: Insufficient documentation

## 2021-04-04 LAB — RESP PANEL BY RT-PCR (FLU A&B, COVID) ARPGX2
Influenza A by PCR: NEGATIVE
Influenza B by PCR: NEGATIVE
SARS Coronavirus 2 by RT PCR: NEGATIVE

## 2021-04-04 LAB — COMPREHENSIVE METABOLIC PANEL
ALT: 20 U/L (ref 0–44)
AST: 28 U/L (ref 15–41)
Albumin: 4.3 g/dL (ref 3.5–5.0)
Alkaline Phosphatase: 86 U/L (ref 38–126)
Anion gap: 12 (ref 5–15)
BUN: 6 mg/dL (ref 6–20)
CO2: 28 mmol/L (ref 22–32)
Calcium: 9.3 mg/dL (ref 8.9–10.3)
Chloride: 100 mmol/L (ref 98–111)
Creatinine, Ser: 0.82 mg/dL (ref 0.61–1.24)
GFR, Estimated: >60 mL/min (ref >60)
Glucose, Bld: 84 mg/dL (ref 70–99)
Potassium: 3.3 mmol/L — ABNORMAL LOW (ref 3.5–5.1)
Sodium: 140 mmol/L (ref 135–145)
Total Bilirubin: 1.2 mg/dL (ref 0.3–1.2)
Total Protein: 6.9 g/dL (ref 6.5–8.1)

## 2021-04-04 LAB — RAPID URINE DRUG SCREEN, HOSP PERFORMED
Amphetamines: POSITIVE — AB
Barbiturates: NOT DETECTED
Benzodiazepines: NOT DETECTED
Cocaine: POSITIVE — AB
Opiates: NOT DETECTED
Tetrahydrocannabinol: POSITIVE — AB

## 2021-04-04 LAB — CBC
HCT: 48.1 % (ref 39.0–52.0)
Hemoglobin: 15.8 g/dL (ref 13.0–17.0)
MCH: 31.2 pg (ref 26.0–34.0)
MCHC: 32.8 g/dL (ref 30.0–36.0)
MCV: 95.1 fL (ref 80.0–100.0)
Platelets: 283 10*3/uL (ref 150–400)
RBC: 5.06 MIL/uL (ref 4.22–5.81)
RDW: 13.2 % (ref 11.5–15.5)
WBC: 10 10*3/uL (ref 4.0–10.5)
nRBC: 0 % (ref 0.0–0.2)

## 2021-04-04 LAB — ACETAMINOPHEN LEVEL: Acetaminophen (Tylenol), Serum: <10 ug/mL — ABNORMAL LOW (ref 10–30)

## 2021-04-04 LAB — ETHANOL: Alcohol, Ethyl (B): 42 mg/dL — ABNORMAL HIGH (ref <10)

## 2021-04-04 LAB — SALICYLATE LEVEL: Salicylate Lvl: <7 mg/dL — ABNORMAL LOW (ref 7.0–30.0)

## 2021-04-04 MED ORDER — THIAMINE HCL 100 MG/ML IJ SOLN: 100.0000 mg | Freq: Every day | INTRAMUSCULAR | Status: DC

## 2021-04-04 MED ORDER — LORAZEPAM 2 MG/ML IJ SOLN: 1.0000 mg | INTRAMUSCULAR | Status: DC | PRN

## 2021-04-04 MED ORDER — HYDROXYZINE HCL 25 MG PO TABS: 25.0000 mg | ORAL_TABLET | Freq: Four times a day (QID) | ORAL | Status: DC | PRN

## 2021-04-04 MED ORDER — OLANZAPINE 10 MG PO TABS
10.0000 mg | ORAL_TABLET | Freq: Every day | ORAL | Status: DC
  Administered 2021-04-04: 10 mg via ORAL
  Filled 2021-04-04: qty 1

## 2021-04-04 MED ORDER — LORAZEPAM 1 MG PO TABS: 1.0000 mg | ORAL_TABLET | ORAL | Status: DC | PRN

## 2021-04-04 MED ORDER — FOLIC ACID 1 MG PO TABS
1.0000 mg | ORAL_TABLET | Freq: Every day | ORAL | Status: DC
  Administered 2021-04-04: 1 mg via ORAL
  Filled 2021-04-04: qty 1

## 2021-04-04 MED ORDER — ADULT MULTIVITAMIN W/MINERALS CH
1.0000 | ORAL_TABLET | Freq: Every day | ORAL | Status: DC
  Administered 2021-04-04: 1 via ORAL
  Filled 2021-04-04: qty 1

## 2021-04-04 MED ORDER — LORAZEPAM 1 MG PO TABS
0.0000 mg | ORAL_TABLET | Freq: Four times a day (QID) | ORAL | Status: DC
  Administered 2021-04-04: 1 mg via ORAL
  Filled 2021-04-04: qty 1

## 2021-04-04 MED ORDER — FLUOXETINE HCL 20 MG PO CAPS
40.0000 mg | ORAL_CAPSULE | Freq: Every day | ORAL | Status: DC
  Administered 2021-04-04: 40 mg via ORAL
  Filled 2021-04-04: qty 2

## 2021-04-04 MED ORDER — THIAMINE HCL 100 MG PO TABS
100.0000 mg | ORAL_TABLET | Freq: Every day | ORAL | Status: DC
  Administered 2021-04-04: 100 mg via ORAL
  Filled 2021-04-04: qty 1

## 2021-04-04 MED ORDER — LORAZEPAM 1 MG PO TABS: 0.0000 mg | ORAL_TABLET | Freq: Two times a day (BID) | ORAL | Status: DC

## 2021-04-04 NOTE — ED Notes (Signed)
Pt transferred to [facility] by [employer]

## 2021-04-04 NOTE — ED Provider Notes (Addendum)
[facility] EMERGENCY DEPARTMENT Provider Note   CSN: [id number] Arrival date & time: 04/04/21  1616     History  Chief Complaint  Patient presents with   Alcohol Problem   Suicidal    Brett House is a 36 y.o. male history of alcohol abuse, previous suicidal attempts here presenting with suicidal ideation.  Patient drinks alcohol daily and last drink was earlier today.  Patient also plans to hang himself.  Patient admits to using cocaine and marijuana as well.  Patient has multiple psych admissions after suicidal attempts.  Patient is voluntary currently.  The history is provided by the patient.      Home Medications Prior to Admission medications   Medication Sig Start Date End Date Taking? Authorizing Provider  albuterol (VENTOLIN HFA) 108 (90 Base) MCG/ACT inhaler Inhale 1-2 puffs into the lungs every 4 (four) hours as needed for wheezing or shortness of breath. 01/16/21 04/04/21 Yes Estella Husk, MD  FLUoxetine (PROZAC) 40 MG capsule Take 1 capsule (40 mg total) by mouth daily. 01/17/21  Yes Estella Husk, MD  hydrOXYzine (ATARAX/VISTARIL) 25 MG tablet Take 1 tablet (25 mg total) by mouth every 6 (six) hours as needed for anxiety. 01/16/21  Yes Estella Husk, MD  OLANZapine (ZYPREXA) 10 MG tablet Take 1 tablet (10 mg total) by mouth at bedtime. 01/16/21  Yes Estella Husk, MD  Multiple Vitamin (MULTIVITAMIN WITH MINERALS) TABS tablet Take 1 tablet by mouth daily. Patient not taking: Reported on 04/04/2021 01/17/21   Estella Husk, MD  nicotine (NICODERM CQ - DOSED IN MG/24 HOURS) 21 mg/24hr patch Place 1 patch (21 mg total) onto the skin daily at 6 (six) AM. Patient not taking: Reported on 04/04/2021 01/17/21   Estella Husk, MD  thiamine 100 MG tablet Take 1 tablet (100 mg total) by mouth daily. Patient not taking: Reported on 04/04/2021 01/17/21   Estella Husk, MD  traZODone (DESYREL) 50 MG tablet Take 1 tablet (50  mg total) by mouth at bedtime as needed for sleep. Patient not taking: Reported on 04/04/2021 01/16/21   Estella Husk, MD      Allergies    Erythromycin    Review of Systems   Review of Systems  Psychiatric/Behavioral:  Positive for suicidal ideas.   All other systems reviewed and are negative.  Physical Exam Updated Vital Signs BP 113/85    Pulse 92    Temp 97.7 F (36.5 C) (Oral)    Resp 18    SpO2 100%  Physical Exam Vitals and nursing note reviewed.  Constitutional:      Comments: Anxious  HENT:     Head: Normocephalic.     Nose: Nose normal.     Mouth/Throat:     Mouth: Mucous membranes are moist.  Eyes:     Extraocular Movements: Extraocular movements intact.     Pupils: Pupils are equal, round, and reactive to light.  Cardiovascular:     Rate and Rhythm: Regular rhythm.     Pulses: Normal pulses.     Heart sounds: Normal heart sounds.  Pulmonary:     Effort: Pulmonary effort is normal.     Breath sounds: Normal breath sounds.  Abdominal:     General: Abdomen is flat.     Palpations: Abdomen is soft.  Musculoskeletal:        General: Normal range of motion.     Cervical back: Normal range of motion and neck supple.  Skin:  General: Skin is warm.     Capillary Refill: Capillary refill takes less than 2 seconds.    ED Results / Procedures / Treatments   Labs (all labs ordered are listed, but only abnormal results are displayed) Labs Reviewed  COMPREHENSIVE METABOLIC PANEL - Abnormal; Notable for the following components:      Result Value   Potassium 3.3 (*)    All other components within normal limits  ETHANOL  SALICYLATE LEVEL  ACETAMINOPHEN LEVEL  CBC  RAPID URINE DRUG SCREEN, HOSP PERFORMED    EKG None  Radiology No results found.  Procedures Procedures    Medications Ordered in ED Medications  LORazepam (ATIVAN) tablet 1-4 mg (has no administration in time range)    Or  LORazepam (ATIVAN) injection 1-4 mg (has no  administration in time range)  thiamine tablet 100 mg (100 mg Oral Given 04/04/21 1822)    Or  thiamine (B-1) injection 100 mg ( Intravenous See Alternative 04/04/21 1822)  folic acid (FOLVITE) tablet 1 mg (1 mg Oral Given 04/04/21 1823)  multivitamin with minerals tablet 1 tablet (1 tablet Oral Given 04/04/21 1822)  LORazepam (ATIVAN) tablet 0-4 mg (has no administration in time range)    Followed by  LORazepam (ATIVAN) tablet 0-4 mg (has no administration in time range)    ED Course/ Medical Decision Making/ A&P                           Medical Decision Making Sean Malinowski is a 36 y.o. male history of previous suicidal attempts, polysubstance abuse here presenting with suicidal ideation.  Patient drinks alcohol at baseline and does marijuana and cocaine.  Patient is tachycardic initially.  Will start on CIWA and will check CBC and CMP and alcohol level and UDS  7:02 PM Patient's labs showed alcohol level of 42.  UDS positive for cocaine and amphetamines and marijuana.  Patient's tachycardia resolved with Ativan.  At this point, patient is medically cleared for psych eval  10:42 PM Patient accepted at [facility] under [name], NP.  Stable for transfer   Problems Addressed: Polysubstance abuse (HCC): acute illness or injury Suicidal ideation: acute illness or injury  Amount and/or Complexity of Data Reviewed External Data Reviewed: notes. Labs: ordered. Decision-making details documented in ED Course.  Risk OTC drugs. Prescription drug management.   Final Clinical Impression(s) / ED Diagnoses Final diagnoses:  None    Rx / DC Orders ED Discharge Orders     None         Charlynne Pander, MD 04/04/21 Julian Reil    Charlynne Pander, MD 04/04/21 [phone]

## 2021-04-04 NOTE — ED Notes (Signed)
Belongings placed in Locker #4  

## 2021-04-04 NOTE — BH Assessment (Addendum)
Comprehensive Clinical Assessment (CCA) Note  04/04/2021 Brett House [id number]  DISPOSITION: TTS completed. Disposition pending provider's disposition decision.   Flowsheet Row ED from 04/04/2021 in [facility] EMERGENCY DEPARTMENT ED from 01/13/2021 in [facility] ED from 01/12/2021 in [facility]  C-SSRS RISK CATEGORY High Risk High Risk High Risk      The patient demonstrates the following risk factors for suicide: Chronic risk factors for suicide include: psychiatric disorder of Bipolar Disorder; Anxiety Disorder; Substance Induced Mood Disorder; Substance Use Disorder, substance use disorder, previous suicide attempts cousin committed suicide and attempt was successful , and completed suicide in a family member. Acute risk factors for suicide include: family or marital conflict, social withdrawal/isolation, and loss (financial, interpersonal, professional). Protective factors for this patient include:  n/a . Considering these factors, the overall suicide risk at this point appears to be high. Patient is appropriate for outpatient follow up pending psych clearance by a [facility] provider..   Chief Complaint:  Chief Complaint  Patient presents with   Alcohol Problem   Suicidal   Psychiatric Evaluation   Depression   Anxiety   Addiction Problem   Visit Diagnosis: Bipolar I Disorder, Depressed Mood; Anxiety Disorder; Substance Induced Mood Disorder; Substance Use Disorder  is a 36 year old male with a reported past psychiatric history significant for bipolar disorder who presented to the [facility] emergency department on 04/04/2021 stating that he doesn't feel that his medication is working. He is currently prescribed Zyprexa, Visteril, Trazadone, and Prozac and prescribed all medications August 2022. The medications are prescribed by [facility]. He was compliant with medications up until 3 weeks ago.  Says that he stopped taking the medications because they weren't working for him. He has hx of depression and suicidal thoughts and continued to have symptoms even with medication management. He did not notify the provider at the [facility] that his medications were not effective. Previous hospitalizations: [facility], [address], [address], and [facility]. The last reported hospitalization was at [facility], November 2022.  Patient with current suicidal thoughts. He says that the thoughts are daily, I hate to wake up every day, I hate to open my eyes in the morning. He has a suicide plan to, hang himself. He has a hx of #4 suicide attempts. The last suicide attempt was October 07, 2019, overdosed on Percocet's. Patient is unaware of what triggered his last suicide attempt.  The other suicide attempts consist of jumping in front of a car (2018), patient hanging himself from a Handicap rail with a belt (2017), overdose (2015). Denies a hx of self-injurious behaviors.   Current stressors include discord with family, unemployed, finances. Current depressive symptoms: hopelessness, anger/irritability, guilt, isolating self from others, despondence, lack grooming/bathing, difficulty with motivating self to start his day, loss of interest in usual pleasures, difficulty getting out of the bed, and insomnia. He sleeps 5 hours per night. Appetite is poor. He reports significant weight loss of 15 pounds in the past 3 weeks. Severe anxiety symptoms reported.  Patient with current homicidal ideations x1 week. States that he had thoughts over the past week due to feeling increasingly short tempered and angry. The homicidal ideations have been toward anyone; no person. No plan/No intent to harm others. No access to firearms. Denies hx of aggressive and/or assaultive behaviors. Patient has current legal issues: trespassing charge. Explains that he was behind [employer] hanging with a friend, police pulled up, addressed his friend,  patient says that  he tried to defend his friend, and he obtained the trespassing charge for being on the property of [employer].  Court date is scheduled for 04/29/2021. He is also on probation for larceny.   Patient has a hx of AVH's. His auditory hallucinations are someone saying his name and whispering. The visual hallucinations are Faces in the wall, patterns, and faces in the wood grain of the floor.   Patient with hx of drug use (crack cocaine, methamphetamine, THC). Also, heavy alcohol use. See substance use section of the CCA for related details. He participated in substance abuse treatment: [facility] (2017), [facility] (2022), [facility] (2019). He reports a family hx of substance use both mother and father.   Patient has a family hx of mental health illnesses. States that he is unsure of the Dx's. Yet, recalls parents being hospitalized at psychiatric facilities. He also has a (paternal) cousin who made a successful suicide attempt. Patient is single. No children. Currently unemployed. Highest level of education is 1 year of college. Support system: 2 sisters, couple of cousins, and a couple of good friends of mine. Denies hx of trauma and/or abuse. He lives with a friend currently.   On evaluation, Pt is alert and oriented x4. Pt speaks in a clear tone, at moderate volume and normal pace. Motor behavior appears normal. Eye contact is good. Pt's mood is depressed and affect is congruent with mood. Thought process is coherent and relevant. There is no indication Pt is currently responding to internal stimuli or experiencing delusional thought content. Pt was cooperative throughout assessment. He says he is willing to sign voluntarily into a psychiatric facility if recommended by the [facility] provider.   CCA Screening, Triage and Referral (STR)  Patient Reported Information How did you hear about us? Self  What Is the Reason for Your Visit/Call Today? Pt presents for eval of SI with plan to hang  himself and also reports is withdrawing from alcohol. Last drink this morning and states he normally drinks "well over a fifth" daily. Takes Zyprexa and Proxac daily, but says the medication "doesn't work  How Long Has This Been Causing You Problems? 1-6 months  What Do You Feel Would Help You the Most Today? Treatment for Depression or other mood problem; Medication(s); Alcohol or Drug Use Treatment; Stress Management   Have You Recently Had Any Thoughts About Hurting Yourself? Yes  Are You Planning to Commit Suicide/Harm Yourself At This time? No   Have you Recently Had Thoughts About Hurting Someone [name]? No  Are You Planning to Harm Someone at This Time? No  Explanation: Pt reports thoughts of harming unknown people who he believes are trying to harm him   Have You Used Any Alcohol or Drugs in the Past 24 Hours? No  How Long Ago Did You Use Drugs or Alcohol? No data recorded What Did You Use and How Much? Unknown amount of heroin, Fentanyl and alcohol   Do You Currently Have a Therapist/Psychiatrist? No  Name of Therapist/Psychiatrist: No data recorded  Have You Been Recently Discharged From Any Office Practice or Programs? No  Explanation of Discharge From Practice/Program: [facility]     CCA Screening Triage Referral Assessment Type of Contact: Tele-Assessment  Telemedicine Service Delivery:   Is this Initial or Reassessment? Initial Assessment  Date Telepsych consult ordered in [facility]:  04/04/21  [facility]  [facility]  [facility]  [facility]   Collateral Involvement: none   Does Patient Have a Court Appointed Legal Guardian? No data recorded Name and Contact of Legal Guardian: No data recorded If Minor and Not Living with Parent(s), Who has Custody? NA  Is CPS involved or ever been involved? Never  Is APS involved or ever been  involved? Never   Patient Determined To Be At Risk for Harm To Self or Others Based on Review of Patient Reported Information or Presenting Complaint? Yes, for Self-Harm  Method: Plan without intent  Availability of Means: Has close by  Intent: Vague intent or NA (pt has homicidal ideation towards people he doesn't know)  Notification Required: No need or identified person  Additional Information for Danger to Others Potential: Active psychosis  Additional Comments for Danger to Others Potential: NA  Are There Guns or Other Weapons in Your Home? No (pt has friend with a firearm--pt wants to borrow it to kill himself)  Types of Guns/Weapons: No data recorded Are These Weapons Safely Secured?                            No data recorded Who Could Verify You Are Able To Have These Secured: No data recorded Do You Have any Outstanding Charges, Pending Court Dates, Parole/Probation? none  Contacted To Inform of Risk of Harm To Self or Others: Unable to Contact:    Does Patient Present under Involuntary Commitment? No  IVC Papers Initial File Date: No data recorded  [place]   Patient Currently Receiving the Following Services: Not Receiving Services   Determination of Need: Emergent (2 hours)   Options For Referral: Inpatient Hospitalization     CCA Biopsychosocial Patient Reported Schizophrenia/Schizoaffective Diagnosis in Past: No   Strengths: Motivated for treatment   Mental Health Symptoms Depression:   Change in energy/activity; Difficulty Concentrating; Fatigue; Hopelessness; Increase/decrease in appetite; Irritability; Sleep (too much or little); Worthlessness   Duration of Depressive symptoms:    Mania:   Change in energy/activity; Irritability; Racing thoughts   Anxiety:    Tension; Worrying; Difficulty concentrating; Sleep; Fatigue   Psychosis:   None   Duration of Psychotic symptoms:    Trauma:   None   Obsessions:    None   Compulsions:   None   Inattention:   None   Hyperactivity/Impulsivity:   N/A   Oppositional/Defiant Behaviors:   N/A   Emotional Irregularity:   Mood lability   Other Mood/Personality Symptoms:   None    Mental Status Exam Appearance and self-care  Stature:   Average   Weight:   Average weight   Clothing:   -- (Covered by blanket)   Grooming:   Normal   Cosmetic use:   None   Posture/gait:   Normal   Motor activity:   Not Remarkable   Sensorium  Attention:   Normal   Concentration:   Normal   Orientation:   X5   Recall/memory:   Defective in Recent   Affect and Mood  Affect:   Anxious; Depressed   Mood:   Anxious; Depressed; Hopeless; Worthless   Relating  Eye contact:   Fleeting   Facial expression:   Anxious; Depressed   Attitude toward examiner:   Cooperative   Thought and Language  Speech flow:  Normal   Thought content:   Appropriate to Mood and Circumstances   Preoccupation:   None  Hallucinations:   None   Organization:  No data recorded  [employer] of Knowledge:   Average   Intelligence:   Average   Abstraction:   Normal   Judgement:   Impaired   Reality Testing:   Adequate   Insight:   Poor   Decision Making:   Impulsive   Social Functioning  Social Maturity:   Isolates   Social Judgement:   "Street Smart"   Stress  Stressors:   Relationship; Work; Family conflict   Coping Ability:   Deficient supports; Overwhelmed   Skill Deficits:   Interpersonal; Self-control   Supports:   Support needed     Religion: Religion/Spirituality Are You A Religious Person?: Yes What is Your Religious Affiliation?: Baptist How Might This Affect Treatment?: NA  Leisure/Recreation: Leisure / Recreation Do You Have Hobbies?: Yes Leisure and Hobbies: "fishing, be outside and camping"  Exercise/Diet: Exercise/Diet Do You Exercise?: No Have You Gained or Lost A  Significant Amount of Weight in the Past Six Months?: Yes-Lost Number of Pounds Lost?:  (15 pounds) Do You Follow a Special Diet?: No Do You Have Any Trouble Sleeping?: Yes Explanation of Sleeping Difficulties: Pt reports poor sleep; 5 hrs per night   CCA Employment/Education Employment/Work Situation: Employment / Work Situation Employment Situation: Unemployed Patient's Job has Been Impacted by Current Illness: No Has Patient ever Been in Equities trader?: No  Education: Education Is Patient Currently Attending School?: No Did Theme park manager?: Yes What Type of College Degree Do you Have?: 1 year of college Did You Have An Individualized Education Program (IIEP): No Did You Have Any Difficulty At [employer]?: No Patient's Education Has Been Impacted by Current Illness: No   CCA Family/Childhood History Family and Relationship History: Family history Marital status: Single Does patient have children?: No  Childhood History:  Childhood History By whom was/is the patient raised?: Both parents Did patient suffer any verbal/emotional/physical/sexual abuse as a child?: No Did patient suffer from severe childhood neglect?: No Has patient ever been sexually abused/assaulted/raped as an adolescent or adult?: No Was the patient ever a victim of a crime or a disaster?: No Witnessed domestic violence?: No Has patient been affected by domestic violence as an adult?: No  Child/Adolescent Assessment:     CCA Substance Use Alcohol/Drug Use: Alcohol / Drug Use Pain Medications: see MAR Prescriptions: see MAR Over the Counter: see MAR History of alcohol / drug use?: Yes Longest period of sobriety (when/how long): 18 months Negative Consequences of Use: Personal relationships, Surveyor, quantity, Work / Programmer, multimedia Withdrawal Symptoms: Seizures, Agitation, Blackouts, Cramps, Delirium, Diarrhea, Fever / Chills, Irritability, Nausea / Vomiting, Weakness, Tremors, Sweats Onset of Seizures: 2015 "on  my cousins back porch"-first seizure Date of most recent seizure: 2015 "on my cousins back porch" Substance #1 Name of Substance 1: THC 1 - Age of First Use: 36 y/o 1 - Amount (size/oz): 1 blunt 1 - Frequency: daily in the past; now 1x per week 1 - Duration: on-going 1 - Last Use / Amount: Last week 1 - Method of Aquiring: unknown 1- Route of Use: inhalation Substance #2 Name of Substance 2: Methamphetamine 2 - Age of First Use: 36 yrs old 2 - Amount (size/oz): "Up to a 8 ball per day" 2 - Frequency: daily 7-8 yrs up until September 2 - Duration: Ongoing 2 - Last Use / Amount: "I don't remember the last time I used methamphetamine" 2 - Method of Aquiring: unknown 2 - Route of Substance Use: unknown  Substance #3 Name of Substance 3: Cocaine 3 - Age of First Use: 36 yrs old 3 - Amount (size/oz): 1 gram per day 3 - Frequency: daily up until 36 yrs old 3 - Duration: 20 yrs oldl to 36 yrs old 3 - Last Use / Amount: "I can't remember, maybe September" 3 - Method of Aquiring: unknown 3 - Route of Substance Use: unknown Substance #4 Name of Substance 4: Alcohol 4 - Age of First Use: 36 yrs old 4 - Amount (size/oz): more than a fifth a day and/or a case of beer per day 4 - Frequency: daily 4 - Duration: on-going since 36 y/o 4 - Last Use / Amount: 04/04/2021; "This morning"; He reports drinking 1 pint of Vodka this morning. 4 - Method of Aquiring: stores 4 - Route of Substance Use: oral                 ASAM's:  Six Dimensions of Multidimensional Assessment  Dimension 1:  Acute Intoxication and/or Withdrawal Potential:   Dimension 1:  Description of individual's past and current experiences of substance use and withdrawal: Pt reports drinking approximately 12 beers daily  Dimension 2:  Biomedical Conditions and Complications:      Dimension 3:  Emotional, Behavioral, or Cognitive Conditions and Complications:  Dimension 3:  Description of emotional, behavioral, or cognitive  conditions and complications: Pt diagnosed with bipolar disorder  Dimension 4:  Readiness to Change:  Dimension 4:  Description of Readiness to Change criteria: Pt in pre-contemplation stage  Dimension 5:  Relapse, Continued use, or Continued Problem Potential:  Dimension 5:  Relapse, continued use, or continued problem potential critiera description: Pt has some understand of impact of substances on mental health symptoms  Dimension 6:  Recovery/Living Environment:  Dimension 6:  Recovery/Iiving environment criteria description: Pt is homeless on the streets  ASAM Severity Score:    ASAM Recommended Level of Treatment: ASAM Recommended Level of Treatment: Level III Residential Treatment   Substance use Disorder (SUD) Substance Use Disorder (SUD)  Checklist Symptoms of Substance Use: Continued use despite having a persistent/recurrent physical/psychological problem caused/exacerbated by use, Continued use despite persistent or recurrent social, interpersonal problems, caused or exacerbated by use, Evidence of tolerance, Evidence of withdrawal (Comment), Large amounts of time spent to obtain, use or recover from the substance(s), Persistent desire or unsuccessful efforts to cut down or control use, Recurrent use that results in a failure to fulfill major role obligations (work, school, home), Presence of craving or strong urge to use, Repeated use in physically hazardous situations, Social, occupational, recreational activities given up or reduced due to use, Substance(s) often taken in larger amounts or over longer times than was intended  Recommendations for Services/Supports/Treatments: Recommendations for Services/Supports/Treatments Recommendations For Services/Supports/Treatments: Inpatient Hospitalization, CD-IOP Intensive Chemical Dependency Program, SAIOP (Substance Abuse Intensive Outpatient Program)  Discharge Disposition:    DSM5 Diagnoses: Patient Active Problem List   Diagnosis Date  Noted   Bipolar 1 disorder (HCC) 11/14/2020   Bipolar 1 disorder, depressed, severe (HCC) 10/12/2020   Suicidal thoughts    Substance induced mood disorder (HCC) 10/07/2020   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

## 2021-04-04 NOTE — ED Triage Notes (Signed)
Pt presents for eval of SI with plan to hang himself and also reports is withdrawing from alcohol. Last drink this morning and states he normally drinks "well over a fifth" daily. Takes Zyprexa and Proxac daily, but says the medication "doesn't work."

## 2021-04-04 NOTE — ED Notes (Signed)
Safe transport called 

## 2021-04-04 NOTE — ED Notes (Signed)
Report given to Bobby Brooks, RN at [facility]  

## 2021-04-04 NOTE — ED Provider Triage Note (Signed)
Emergency Medicine Provider Triage Evaluation Note  Brett House , a 36 y.o. male  was evaluated in triage.  Pt complains of alcohol use and SI.  He last had a drink this morning.  He drinks a fifth of vodka a day.   He also endorses marijuana, cocaine and meth, he doesn't inject.   He reports that he is suicidal with a plan to hang him self.   He has 4 prior attempts with overdoses.   Physical Exam  BP (!) 146/101 (BP Location: Right Arm)    Pulse (!) 110    Temp 97.7 F (36.5 C) (Oral)    Resp 16    SpO2 100%  Gen:   Awake, no distress   Resp:  Normal effort  MSK:   Moves extremities without difficulty  Other:  Normal speech.  Mildly tremulous.  Medical Decision Making  Medically screening exam initiated at 5:48 PM.  Appropriate orders placed.  Brett House was informed that the remainder of the evaluation will be completed by another provider, this initial triage assessment does not replace that evaluation, and the importance of remaining in the ED until their evaluation is complete.     Cristina Gong, [place] 04/04/21 1752

## 2021-04-04 NOTE — BH Assessment (Signed)
@  1854, requested patient's nurse Percival Spanish, RN) to place the TTS machine in patient's room for his initial TTS assessment.

## 2021-04-05 ENCOUNTER — Other Ambulatory Visit (HOSPITAL_COMMUNITY)
Admission: EM | Admit: 2021-04-05 | Discharge: 2021-04-09 | Disposition: A | Discharge status: Home / Self Care | Payer: No Payment, Other | Attending: Family | Admitting: Family

## 2021-04-05 DIAGNOSIS — Z560 Unemployment, unspecified: Secondary | ICD-10-CM | POA: Insufficient documentation

## 2021-04-05 DIAGNOSIS — Y902 Blood alcohol level of 40-59 mg/100 ml: Secondary | ICD-10-CM | POA: Insufficient documentation

## 2021-04-05 DIAGNOSIS — Z79899 Other long term (current) drug therapy: Secondary | ICD-10-CM | POA: Insufficient documentation

## 2021-04-05 DIAGNOSIS — F1910 Other psychoactive substance abuse, uncomplicated: Secondary | ICD-10-CM | POA: Diagnosis present

## 2021-04-05 DIAGNOSIS — F319 Bipolar disorder, unspecified: Secondary | ICD-10-CM | POA: Diagnosis not present

## 2021-04-05 DIAGNOSIS — F17210 Nicotine dependence, cigarettes, uncomplicated: Secondary | ICD-10-CM | POA: Insufficient documentation

## 2021-04-05 DIAGNOSIS — R45851 Suicidal ideations: Secondary | ICD-10-CM | POA: Diagnosis not present

## 2021-04-05 DIAGNOSIS — R45850 Homicidal ideations: Secondary | ICD-10-CM | POA: Insufficient documentation

## 2021-04-05 DIAGNOSIS — F1490 Cocaine use, unspecified, uncomplicated: Secondary | ICD-10-CM | POA: Insufficient documentation

## 2021-04-05 DIAGNOSIS — Z818 Family history of other mental and behavioral disorders: Secondary | ICD-10-CM | POA: Insufficient documentation

## 2021-04-05 DIAGNOSIS — F1994 Other psychoactive substance use, unspecified with psychoactive substance-induced mood disorder: Secondary | ICD-10-CM | POA: Diagnosis present

## 2021-04-05 DIAGNOSIS — F1020 Alcohol dependence, uncomplicated: Secondary | ICD-10-CM | POA: Insufficient documentation

## 2021-04-05 DIAGNOSIS — F3130 Bipolar disorder, current episode depressed, mild or moderate severity, unspecified: Secondary | ICD-10-CM

## 2021-04-05 DIAGNOSIS — F1590 Other stimulant use, unspecified, uncomplicated: Secondary | ICD-10-CM | POA: Insufficient documentation

## 2021-04-05 DIAGNOSIS — F1510 Other stimulant abuse, uncomplicated: Secondary | ICD-10-CM | POA: Diagnosis present

## 2021-04-05 MED ORDER — RISPERIDONE 2 MG PO TABS
2.0000 mg | ORAL_TABLET | Freq: Every day | ORAL | Status: DC
  Administered 2021-04-05 – 2021-04-08 (×4): 2 mg via ORAL
  Filled 2021-04-05 (×3): qty 1
  Filled 2021-04-05: qty 7
  Filled 2021-04-05: qty 1

## 2021-04-05 MED ORDER — ALUM & MAG HYDROXIDE-SIMETH 200-200-20 MG/5ML PO SUSP: 30.0000 mL | ORAL | Status: DC | PRN

## 2021-04-05 MED ORDER — LOPERAMIDE HCL 2 MG PO CAPS: 2.0000 mg | ORAL_CAPSULE | ORAL | Status: AC | PRN

## 2021-04-05 MED ORDER — FLUOXETINE HCL 20 MG PO CAPS
40.0000 mg | ORAL_CAPSULE | Freq: Every day | ORAL | Status: DC
  Administered 2021-04-05 – 2021-04-08 (×4): 40 mg via ORAL
  Filled 2021-04-05 (×2): qty 2
  Filled 2021-04-05: qty 14
  Filled 2021-04-05 (×2): qty 2

## 2021-04-05 MED ORDER — LORAZEPAM 1 MG PO TABS: 1.0000 mg | ORAL_TABLET | Freq: Four times a day (QID) | ORAL | Status: AC | PRN

## 2021-04-05 MED ORDER — ACETAMINOPHEN 325 MG PO TABS
650.0000 mg | ORAL_TABLET | Freq: Four times a day (QID) | ORAL | Status: DC | PRN
  Administered 2021-04-07: 650 mg via ORAL
  Filled 2021-04-05: qty 2

## 2021-04-05 MED ORDER — NICOTINE 21 MG/24HR TD PT24
21.0000 mg | MEDICATED_PATCH | Freq: Once | TRANSDERMAL | Status: AC
  Administered 2021-04-05: 21 mg via TRANSDERMAL
  Filled 2021-04-05: qty 1

## 2021-04-05 MED ORDER — ONDANSETRON 4 MG PO TBDP: 4.0000 mg | ORAL_TABLET | Freq: Four times a day (QID) | ORAL | Status: AC | PRN

## 2021-04-05 MED ORDER — MAGNESIUM HYDROXIDE 400 MG/5ML PO SUSP: 30.0000 mL | Freq: Every day | ORAL | Status: DC | PRN

## 2021-04-05 MED ORDER — FOLIC ACID 1 MG PO TABS
1.0000 mg | ORAL_TABLET | Freq: Every day | ORAL | Status: DC
  Administered 2021-04-05 – 2021-04-08 (×4): 1 mg via ORAL
  Filled 2021-04-05 (×4): qty 1

## 2021-04-05 MED ORDER — ADULT MULTIVITAMIN W/MINERALS CH
1.0000 | ORAL_TABLET | Freq: Every day | ORAL | Status: DC
  Administered 2021-04-05 – 2021-04-08 (×4): 1 via ORAL
  Filled 2021-04-05 (×4): qty 1

## 2021-04-05 MED ORDER — ALBUTEROL SULFATE HFA 108 (90 BASE) MCG/ACT IN AERS
1.0000 | INHALATION_SPRAY | RESPIRATORY_TRACT | Status: DC | PRN
  Filled 2021-04-05: qty 8

## 2021-04-05 MED ORDER — THIAMINE HCL 100 MG PO TABS
100.0000 mg | ORAL_TABLET | Freq: Every day | ORAL | Status: DC
  Administered 2021-04-06 – 2021-04-08 (×3): 100 mg via ORAL
  Filled 2021-04-05 (×3): qty 1

## 2021-04-05 MED ORDER — OLANZAPINE 10 MG PO TABS: 10.0000 mg | ORAL_TABLET | Freq: Every day | ORAL | Status: DC

## 2021-04-05 NOTE — ED Notes (Signed)
Pt has been resting. Getting walked over to [facility].

## 2021-04-05 NOTE — ED Provider Notes (Signed)
Behavioral Health Admission H&P [facility] & OBS)  Date: 04/05/21 Patient Name: Brett House MRN: [id number] Chief Complaint: No chief complaint on file.     Diagnoses:  Final diagnoses:  Bipolar I disorder, most recent episode depressed (HCC)  Alcohol use disorder, severe, dependence (HCC)    HPI: Brett House is a 36 y.o. male with a history of Bipolar I Disorder, alcohol use, cocaine use, amphetamine use, and marijuana use who presented to [facility] with SI. Patient was transferred to [facility] for continuous assessment.  On evaluation at [facility], patient is drowsy, easily aroused.  Patient had to be awakened multiple times during the assessment.  He provides limited information during the assessment.  Patient received Ativan and Zyprexa prior to transfer from the ED.  Please see TTS assessment for additional information.  Patient continues to report suicidal ideations with a plan to hang himself.  He reports passive homicidal ideations toward "multiple people."  He denies any homicidal intent or plans.  Patient reports daily use of alcohol.  He states that he drinks a case of beer daily.  He reports using marijuana 1 to 2 days/week.  He reports occasional use of cocaine, he states last use was probably a month ago.  He denies use of methamphetamines and other substances.  BAL 42 in the ED.  UDS positive for cocaine, amphetamines, and THC.  Patient denies auditory and visual hallucinations.  No delusions elicited during this assessment.  TTS Assessment: is a 36 year old male with a reported past psychiatric history significant for bipolar disorder who presented to the [facility] emergency department on 04/04/2021 stating that he doesn't feel that his medication is working. He is currently prescribed Zyprexa, Visteril, Trazadone, and Prozac and prescribed all medications August 2022. The medications are prescribed by [facility]. He was compliant with medications up until 3 weeks ago. Says that he stopped  taking the medications because they weren't working for him. He has hx of depression and suicidal thoughts and continued to have symptoms even with medication management. He did not notify the provider at the [facility] that his medications were not effective. Previous hospitalizations: [facility], [address], [address], and [place]. The last reported hospitalization was at [facility], November 2022.   Patient with current suicidal thoughts. He says that the thoughts are daily, I hate to wake up every day, I hate to open my eyes in the morning. He has a suicide plan to, hang himself. He has a hx of #4 suicide attempts. The last suicide attempt was October 07, 2019, overdosed on Percocet's. Patient is unaware of what triggered his last suicide attempt.  The other suicide attempts consist of jumping in front of a car (2018), patient hanging himself from a Handicap rail with a belt (2017), overdose (2015). Denies a hx of self-injurious behaviors.    Current stressors include discord with family, unemployed, finances. Current depressive symptoms: hopelessness, anger/irritability, guilt, isolating self from others, despondence, lack grooming/bathing, difficulty with motivating self to start his day, loss of interest in usual pleasures, difficulty getting out of the bed, and insomnia. He sleeps 5 hours per night. Appetite is poor. He reports significant weight loss of 15 pounds in the past 3 weeks. Severe anxiety symptoms reported.   Patient with current homicidal ideations x1 week. States that he had thoughts over the past week due to feeling increasingly short tempered and angry. The homicidal ideations have been toward anyone; no person. No plan/No intent to harm others. No access to firearms. Denies hx  of aggressive and/or assaultive behaviors. Patient has current legal issues: trespassing charge. Explains that he was behind [employer] hanging with a friend, police pulled up, addressed his friend, patient says that he  tried to defend his friend, and he obtained the trespassing charge for being on the property of [employer].  Court date is scheduled for 04/29/2021. He is also on probation for larceny.    Patient has a hx of AVH's. His auditory hallucinations are someone saying his name and whispering. The visual hallucinations are Faces in the wall, patterns, and faces in the wood grain of the floor.    Patient with hx of drug use (crack cocaine, methamphetamine, THC). Also, heavy alcohol use. See substance use section of the [facility] for related details. He participated in substance abuse treatment: [facility] (2017), [facility] (2022), [facility] (2019). He reports a family hx of substance use both mother and father.    Patient has a family hx of mental health illnesses. States that he is unsure of the Dx's. Yet, recalls parents being hospitalized at psychiatric facilities. He also has a (paternal) cousin who made a successful suicide attempt. Patient is single. No children. Currently unemployed. Highest level of education is 1 year of college. Support system: 2 sisters, couple of cousins, and a couple of good friends of mine. Denies hx of trauma and/or abuse. He lives with a friend currently.   PHQ 2-9:   Flowsheet Row ED from 04/04/2021 in [facility] EMERGENCY DEPARTMENT ED from 01/13/2021 in [facility] ED from 01/12/2021 in [facility]  C-SSRS RISK CATEGORY High Risk High Risk High Risk        Total Time spent with patient: 15 minutes  Musculoskeletal  Strength & Muscle Tone: within normal limits Gait & Station: normal Patient leans: N/A  Psychiatric Specialty Exam  Presentation General Appearance: Appropriate for Environment  Eye Contact:Minimal  Speech:Clear and Coherent; Normal Rate  Speech Volume:Decreased  Handedness:Right   Mood and Affect  Mood:Depressed  Affect:Congruent   Thought Process  Thought  Processes:Coherent  Descriptions of Associations:Intact  Orientation:Full (Time, Place and Person)  Thought Content:Logical  Diagnosis of Schizophrenia or Schizoaffective disorder in past: No   Hallucinations:Hallucinations: None  Ideas of Reference:None  Suicidal Thoughts:Suicidal Thoughts: Yes, Active SI Active Intent and/or Plan: With Intent; With Plan; With Means to Carry Out  Homicidal Thoughts:Homicidal Thoughts: Yes, Passive HI Passive Intent and/or Plan: Without Intent; Without Plan   Sensorium  Memory:Immediate Good  Judgment:Intact  Insight:Present   Executive Functions  Concentration:Fair  Attention Span:Fair  Recall:Fair  Fund of Knowledge:Good  Language:Good   Psychomotor Activity  Psychomotor Activity:Psychomotor Activity: Normal   Assets  Assets:Desire for Improvement; Physical Health   Sleep  Sleep:Sleep: Fair   Nutritional Assessment (For OBS and FBC admissions only) Has the patient had a weight loss or gain of 10 pounds or more in the last 3 months?: No Has the patient had a decrease in food intake/or appetite?: No Does the patient have dental problems?: No Does the patient have eating habits or behaviors that may be indicators of an eating disorder including binging or inducing vomiting?: No Has the patient recently lost weight without trying?: 0 Has the patient been eating poorly because of a decreased appetite?: 0 Malnutrition Screening Tool Score: 0    Physical Exam ROS  Blood pressure 119/76, pulse 67, temperature 98.1 F (36.7 C), temperature source Oral, resp. rate 16, SpO2 100 %. There is no  height or weight on file to calculate BMI.  Past Psychiatric History: Patient has had several psychiatric hospitalizations in the past including hospitalizations in [place], [facility] & also [facility].   Is the patient at risk to self? Yes  Has the patient been a risk to self in the past 6 months? Yes .    Has the  patient been a risk to self within the distant past? Yes   Is the patient a risk to others? No   Has the patient been a risk to others in the past 6 months? No   Has the patient been a risk to others within the distant past? No   Past Medical History:  Past Medical History:  Diagnosis Date   Anxiety    Asthma    Bipolar disorder (HCC)    Depression    No past surgical history on file.  Family History: No family history on file.  Social History:  Social History   Socioeconomic History   Marital status: Single    Spouse name: Not on file   Number of children: 0   Years of education: Not on file   Highest education level: 12th grade  Occupational History   Occupation: Unemployed  Tobacco Use   Smoking status: Every Day    Packs/day: 1.50    Years: 15.00    Pack years: 22.50    Types: Cigarettes   Smokeless tobacco: Current  Vaping Use   Vaping Use: Never used  Substance and Sexual Activity   Alcohol use: Yes    Comment: 6 pack once a week for last 8-9 months   Drug use: Yes    Types: Marijuana    Comment: rarely   Sexual activity: Yes  Other Topics Concern   Not on file  Social History Narrative   Not on file   Social Determinants of Health   Financial Resource Strain: Not on file  Food Insecurity: Not on file  Transportation Needs: Not on file  Physical Activity: Not on file  Stress: Not on file  Social Connections: Not on file  Intimate Partner Violence: Not on file    SDOH:  SDOH Screenings   Alcohol Screen: Low Risk    Last Alcohol Screening Score (AUDIT): 0  Depression (PHQ2-9): Not on file  Financial Resource Strain: Not on file  Food Insecurity: Not on file  Housing: Not on file  Physical Activity: Not on file  Social Connections: Not on file  Stress: Not on file  Tobacco Use: High Risk   Smoking Tobacco Use: Every Day   Smokeless Tobacco Use: Current   Passive Exposure: Not on file  Transportation Needs: Not on file    Last Labs:   Admission on 04/04/2021, Discharged on 04/04/2021  Component Date Value Ref Range Status   Sodium 04/04/2021 140  135 - 145 mmol/L Final   Potassium 04/04/2021 3.3 (L)  3.5 - 5.1 mmol/L Final   Chloride 04/04/2021 100  98 - 111 mmol/L Final   CO2 04/04/2021 28  22 - 32 mmol/L Final   Glucose, Bld 04/04/2021 84  70 - 99 mg/dL Final   Glucose reference range applies only to samples taken after fasting for at least 8 hours.   BUN 04/04/2021 6  6 - 20 mg/dL Final   Creatinine, Ser 04/04/2021 0.82  0.61 - 1.24 mg/dL Final   Calcium 16/12/9602 9.3  8.9 - 10.3 mg/dL Final   Total Protein 54/11/8117 6.9  6.5 - 8.1 g/dL Final  Albumin 04/04/2021 4.3  3.5 - 5.0 g/dL Final   AST 96/06/5407 28  15 - 41 U/L Final   ALT 04/04/2021 20  0 - 44 U/L Final   Alkaline Phosphatase 04/04/2021 86  38 - 126 U/L Final   Total Bilirubin 04/04/2021 1.2  0.3 - 1.2 mg/dL Final   GFR, Estimated 04/04/2021 >60  >60 mL/min Final   Comment: (NOTE) Calculated using the CKD-EPI Creatinine Equation (2021)    Anion gap 04/04/2021 12  5 - 15 Final   Performed at [facility], [address], [place], [place] [address]   Alcohol, Ethyl (B) 04/04/2021 42 (H)  <10 mg/dL Final   Comment: (NOTE) Lowest detectable limit for serum alcohol is 10 mg/dL.  For medical purposes only. Performed at [facility], [address] [place], [place] [address]    Salicylate Lvl 04/04/2021 <7.0 (L)  7.0 - 30.0 mg/dL Final   Performed at [facility], [address] [place], [place] [address]   Acetaminophen (Tylenol), Serum 04/04/2021 <10 (L)  10 - 30 ug/mL Final   Comment: (NOTE) Therapeutic concentrations vary significantly. A range of 10-30 ug/mL  may be an effective concentration for many patients. However, some  are best treated at concentrations outside of this range. Acetaminophen concentrations >150 ug/mL at 4 hours after ingestion  and >50 ug/mL at 12 hours after ingestion are often associated with   toxic reactions.  Performed at [facility], [address] [place], [place] [address]    WBC 04/04/2021 10.0  4.0 - 10.5 K/uL Final   RBC 04/04/2021 5.06  4.22 - 5.81 MIL/uL Final   Hemoglobin 04/04/2021 15.8  13.0 - 17.0 g/dL Final   HCT 84/69/6295 48.1  39.0 - 52.0 % Final   MCV 04/04/2021 95.1  80.0 - 100.0 fL Final   MCH 04/04/2021 31.2  26.0 - 34.0 pg Final   MCHC 04/04/2021 32.8  30.0 - 36.0 g/dL Final   RDW 28/41/3244 13.2  11.5 - 15.5 % Final   Platelets 04/04/2021 283  150 - 400 K/uL Final   nRBC 04/04/2021 0.0  0.0 - 0.2 % Final   Performed at [facility], [address] [place], [place] [address]   Opiates 04/04/2021 NONE DETECTED  NONE DETECTED Final   Cocaine 04/04/2021 POSITIVE (A)  NONE DETECTED Final   Benzodiazepines 04/04/2021 NONE DETECTED  NONE DETECTED Final   Amphetamines 04/04/2021 POSITIVE (A)  NONE DETECTED Final   Tetrahydrocannabinol 04/04/2021 POSITIVE (A)  NONE DETECTED Final   Barbiturates 04/04/2021 NONE DETECTED  NONE DETECTED Final   Comment: (NOTE) DRUG SCREEN FOR MEDICAL PURPOSES ONLY.  IF CONFIRMATION IS NEEDED FOR ANY PURPOSE, NOTIFY LAB WITHIN 5 DAYS.  LOWEST DETECTABLE LIMITS FOR URINE DRUG SCREEN Drug Class                     Cutoff (ng/mL) Amphetamine and metabolites    1000 Barbiturate and metabolites    200 Benzodiazepine                 200 Tricyclics and metabolites     300 Opiates and metabolites        300 Cocaine and metabolites        300 THC                            50 Performed at [facility], [address] [place], [place] [address]    SARS Coronavirus 2 by RT PCR 04/04/2021 NEGATIVE  NEGATIVE Final   Comment: (NOTE) SARS-CoV-2 target nucleic acids are NOT DETECTED.  The SARS-CoV-2 RNA is generally detectable in upper respiratory specimens during the acute phase of infection. The lowest concentration of SARS-CoV-2 viral copies this assay can detect is 138 copies/mL. A negative result does  not preclude SARS-Cov-2 infection and should not be used as the sole basis for treatment or other patient management decisions. A negative result may occur with  improper specimen collection/handling, submission of specimen other than nasopharyngeal swab, presence of viral mutation(s) within the areas targeted by this assay, and inadequate number of viral copies(<138 copies/mL). A negative result must be combined with clinical observations, patient history, and epidemiological information. The expected result is Negative.  Fact Sheet for Patients:  BloggerCourse.comhttps://www.fda.gov/media/152166/download  Fact Sheet for Healthcare Providers:  SeriousBroker.ithttps://www.fda.gov/media/152162/download  This test is no                          t yet approved or cleared by the [place] FDA and  has been authorized for detection and/or diagnosis of SARS-CoV-2 by FDA under an Emergency Use Authorization (EUA). This EUA will remain  in effect (meaning this test can be used) for the duration of the COVID-19 declaration under Section 564(b)(1) of the Act, 21 U.S.C.section 360bbb-3(b)(1), unless the authorization is terminated  or revoked sooner.       Influenza A by PCR 04/04/2021 NEGATIVE  NEGATIVE Final   Influenza B by PCR 04/04/2021 NEGATIVE  NEGATIVE Final   Comment: (NOTE) The Xpert Xpress SARS-CoV-2/FLU/RSV plus assay is intended as an aid in the diagnosis of influenza from Nasopharyngeal swab specimens and should not be used as a sole basis for treatment. Nasal washings and aspirates are unacceptable for Xpert Xpress SARS-CoV-2/FLU/RSV testing.  Fact Sheet for Patients: BloggerCourse.comhttps://www.fda.gov/media/152166/download  Fact Sheet for Healthcare Providers: SeriousBroker.ithttps://www.fda.gov/media/152162/download  This test is not yet approved or cleared by the [place] FDA and has been authorized for detection and/or diagnosis of SARS-CoV-2 by FDA under an Emergency Use Authorization (EUA). This EUA will remain in  effect (meaning this test can be used) for the duration of the COVID-19 declaration under Section 564(b)(1) of the Act, 21 U.S.C. section 360bbb-3(b)(1), unless the authorization is terminated or revoked.  Performed at [facility], [address] [place], [place] [address]   Admission on 01/13/2021, Discharged on 01/16/2021  Component Date Value Ref Range Status   Magnesium 01/12/2021 2.4  1.7 - 2.4 mg/dL Final   Performed at [facility], [address] [place], [place] [address]   Cholesterol 01/12/2021 198  0 - 200 mg/dL Final   Triglycerides 91/47/829511/01/2021 281 (H)  <150 mg/dL Final   HDL 62/13/086511/01/2021 76  >40 mg/dL Final   Total CHOL/HDL Ratio 01/12/2021 2.6  RATIO Final   VLDL 01/12/2021 56 (H)  0 - 40 mg/dL Final   LDL Cholesterol 01/12/2021 66  0 - 99 mg/dL Final   Comment:        Total Cholesterol/HDL:CHD Risk Coronary Heart Disease Risk Table                     Men   Women  1/2 Average Risk   3.4   3.3  Average Risk       5.0   4.4  2 X Average Risk  9.6   7.1  3 X Average Risk  23.4   11.0        Use the calculated Patient Ratio above and the CHD Risk Table to determine the patient's CHD Risk.        ATP III CLASSIFICATION (LDL):  <100     mg/dL   Optimal  469-629  mg/dL   Near or Above                    Optimal  130-159  mg/dL   Borderline  528-413  mg/dL   High  >244     mg/dL   Very High Performed at [facility], [address] [place], [place] [address]    TSH 01/12/2021 1.069  0.350 - 4.500 uIU/mL Final   Comment: Performed by a 3rd Generation assay with a functional sensitivity of <=0.01 uIU/mL. Performed at [facility], [address], [place], [place] [address]    Sodium 01/16/2021 136  135 - 145 mmol/L Final   Potassium 01/16/2021 4.5  3.5 - 5.1 mmol/L Final   Chloride 01/16/2021 101  98 - 111 mmol/L Final   CO2 01/16/2021 27  22 - 32 mmol/L Final   Glucose, Bld 01/16/2021 114 (H)  70 -  99 mg/dL Final   Glucose reference range applies only to samples taken after fasting for at least 8 hours.   BUN 01/16/2021 11  6 - 20 mg/dL Final   Creatinine, Ser 01/16/2021 0.73  0.61 - 1.24 mg/dL Final   Calcium 44/05/4740 9.0  8.9 - 10.3 mg/dL Final   GFR, Estimated 01/16/2021 >60  >60 mL/min Final   Comment: (NOTE) Calculated using the CKD-EPI Creatinine Equation (2021)    Anion gap 01/16/2021 8  5 - 15 Final   Performed at [facility], [address], [place], [place] [address]  Admission on 01/12/2021, Discharged on 01/13/2021  Component Date Value Ref Range Status   Sodium 01/12/2021 139  135 - 145 mmol/L Final   Potassium 01/12/2021 3.1 (L)  3.5 - 5.1 mmol/L Final   Chloride 01/12/2021 107  98 - 111 mmol/L Final   CO2 01/12/2021 23  22 - 32 mmol/L Final   Glucose, Bld 01/12/2021 107 (H)  70 - 99 mg/dL Final   Glucose reference range applies only to samples taken after fasting for at least 8 hours.   BUN 01/12/2021 6  6 - 20 mg/dL Final   Creatinine, Ser 01/12/2021 0.62  0.61 - 1.24 mg/dL Final   Calcium 87/56/4332 8.3 (L)  8.9 - 10.3 mg/dL Final   Total Protein 95/18/8416 6.5  6.5 - 8.1 g/dL Final   Albumin 60/63/0160 4.1  3.5 - 5.0 g/dL Final   AST 10/93/2355 29  15 - 41 U/L Final   ALT 01/12/2021 22  0 - 44 U/L Final   Alkaline Phosphatase 01/12/2021 65  38 - 126 U/L Final   Total Bilirubin 01/12/2021 0.6  0.3 - 1.2 mg/dL Final   GFR, Estimated 01/12/2021 >60  >60 mL/min Final   Comment: (NOTE) Calculated using the CKD-EPI Creatinine Equation (2021)    Anion gap 01/12/2021 9  5 - 15 Final   Performed at [facility], [address], [place], [place] [address]   Alcohol, Ethyl (B) 01/12/2021 325 (HH)  <10 mg/dL Final   Comment: CRITICAL RESULT CALLED TO, READ BACK BY AND VERIFIED WITH: SONYA, RN @ 1624 ON 01/12/2021 BY LBROOKS, MLT (  NOTE) Lowest detectable limit for serum alcohol is 10 mg/dL.  For medical purposes only. Performed at [facility], [address] [place], [place] [address]    Salicylate Lvl 01/12/2021 <7.0 (L)  7.0 - 30.0 mg/dL Final   Performed at [facility], [address] [place], [place] [address]   Acetaminophen (Tylenol), Serum 01/12/2021 <10 (L)  10 - 30 ug/mL Final   Comment: (NOTE) Therapeutic concentrations vary significantly. A range of 10-30 ug/mL  may be an effective concentration for many patients. However, some  are best treated at concentrations outside of this range. Acetaminophen concentrations >150 ug/mL at 4 hours after ingestion  and >50 ug/mL at 12 hours after ingestion are often associated with  toxic reactions.  Performed at [facility], [address] [place], [place] [address]    WBC 01/12/2021 9.3  4.0 - 10.5 K/uL Final   RBC 01/12/2021 4.86  4.22 - 5.81 MIL/uL Final   Hemoglobin 01/12/2021 15.3  13.0 - 17.0 g/dL Final   HCT 28/41/324411/01/2021 43.6  39.0 - 52.0 % Final   MCV 01/12/2021 89.7  80.0 - 100.0 fL Final   MCH 01/12/2021 31.5  26.0 - 34.0 pg Final   MCHC 01/12/2021 35.1  30.0 - 36.0 g/dL Final   RDW 01/02/725311/01/2021 13.2  11.5 - 15.5 % Final   Platelets 01/12/2021 271  150 - 400 K/uL Final   nRBC 01/12/2021 0.0  0.0 - 0.2 % Final   Performed at [facility], [address] [place], [place] [address]   Opiates 01/12/2021 NONE DETECTED  NONE DETECTED Final   Cocaine 01/12/2021 NONE DETECTED  NONE DETECTED Final   Benzodiazepines 01/12/2021 NONE DETECTED  NONE DETECTED Final   Amphetamines 01/12/2021 NONE DETECTED  NONE DETECTED Final   Tetrahydrocannabinol 01/12/2021 NONE DETECTED  NONE DETECTED Final   Barbiturates 01/12/2021 NONE DETECTED  NONE DETECTED Final   Comment: (NOTE) DRUG SCREEN FOR MEDICAL PURPOSES ONLY.  IF CONFIRMATION IS NEEDED FOR ANY PURPOSE, NOTIFY LAB WITHIN 5 DAYS.  LOWEST DETECTABLE LIMITS FOR URINE DRUG SCREEN Drug Class                     Cutoff (ng/mL) Amphetamine and  metabolites    1000 Barbiturate and metabolites    200 Benzodiazepine                 200 Tricyclics and metabolites     300 Opiates and metabolites        300 Cocaine and metabolites        300 THC                            50 Performed at [facility], [address] [place], [place] [address]    SARS Coronavirus 2 by RT PCR 01/12/2021 NEGATIVE  NEGATIVE Final   Comment: (NOTE) SARS-CoV-2 target nucleic acids are NOT DETECTED.  The SARS-CoV-2 RNA is generally detectable in upper respiratory specimens during the acute phase of infection. The lowest concentration of SARS-CoV-2 viral copies this assay can detect is 138 copies/mL. A negative result does not preclude SARS-Cov-2 infection and should not be used as the sole basis for treatment or other patient management decisions. A negative result may occur with  improper specimen collection/handling, submission of specimen other than nasopharyngeal swab, presence of viral mutation(s) within the areas targeted by this assay, and  inadequate number of viral copies(<138 copies/mL). A negative result must be combined with clinical observations, patient history, and epidemiological information. The expected result is Negative.  Fact Sheet for Patients:  BloggerCourse.com  Fact Sheet for Healthcare Providers:  SeriousBroker.it  This test is no                          t yet approved or cleared by the [place] FDA and  has been authorized for detection and/or diagnosis of SARS-CoV-2 by FDA under an Emergency Use Authorization (EUA). This EUA will remain  in effect (meaning this test can be used) for the duration of the COVID-19 declaration under Section 564(b)(1) of the Act, 21 U.S.C.section 360bbb-3(b)(1), unless the authorization is terminated  or revoked sooner.       Influenza A by PCR 01/12/2021 NEGATIVE  NEGATIVE Final   Influenza B by PCR 01/12/2021  NEGATIVE  NEGATIVE Final   Comment: (NOTE) The Xpert Xpress SARS-CoV-2/FLU/RSV plus assay is intended as an aid in the diagnosis of influenza from Nasopharyngeal swab specimens and should not be used as a sole basis for treatment. Nasal washings and aspirates are unacceptable for Xpert Xpress SARS-CoV-2/FLU/RSV testing.  Fact Sheet for Patients: BloggerCourse.com  Fact Sheet for Healthcare Providers: SeriousBroker.it  This test is not yet approved or cleared by the [place] FDA and has been authorized for detection and/or diagnosis of SARS-CoV-2 by FDA under an Emergency Use Authorization (EUA). This EUA will remain in effect (meaning this test can be used) for the duration of the COVID-19 declaration under Section 564(b)(1) of the Act, 21 U.S.C. section 360bbb-3(b)(1), unless the authorization is terminated or revoked.  Performed at [facility], [address], [place], [place] [address]   Admission on 11/14/2020, Discharged on 11/19/2020  Component Date Value Ref Range Status   RPR Ser Ql 11/14/2020 NON REACTIVE  NON REACTIVE Final   Performed at [facility], [address] [place], [place] [address]   Hepatitis B Surface Ag 11/14/2020 NON REACTIVE  NON REACTIVE Final   HCV Ab 11/14/2020 NON REACTIVE  NON REACTIVE Final   Comment: (NOTE) Nonreactive HCV antibody screen is consistent with no HCV infections,  unless recent infection is suspected or other evidence exists to indicate HCV infection.     Hep A IgM 11/14/2020 NON REACTIVE  NON REACTIVE Final   Hep B C IgM 11/14/2020 NON REACTIVE  NON REACTIVE Final   Performed at [facility], [address] [place], [place] [address]   HIV Screen 4th Generation wRfx 11/14/2020 Non Reactive  Non Reactive Final   Performed at [facility], [address], [place], [place] [address]  Admission on 11/13/2020, Discharged on 11/14/2020   Component Date Value Ref Range Status   Sodium 11/13/2020 136  135 - 145 mmol/L Final   Potassium 11/13/2020 3.8  3.5 - 5.1 mmol/L Final   Chloride 11/13/2020 101  98 - 111 mmol/L Final   CO2 11/13/2020 24  22 - 32 mmol/L Final   Glucose, Bld 11/13/2020 102 (H)  70 - 99 mg/dL Final   Glucose reference range applies only to samples taken after fasting for at least 8 hours.   BUN 11/13/2020 5 (L)  6 - 20 mg/dL Final   Creatinine, Ser 11/13/2020 0.69  0.61 - 1.24 mg/dL Final   Calcium 12/28/2534 9.3  8.9 - 10.3 mg/dL Final   Total Protein 64/40/3474 7.3  6.5 - 8.1 g/dL Final   Albumin 16/12/9602 4.5  3.5 - 5.0 g/dL Final   AST 54/11/8117 21  15 - 41 U/L Final   ALT 11/13/2020 14  0 - 44 U/L Final   Alkaline Phosphatase 11/13/2020 97  38 - 126 U/L Final   Total Bilirubin 11/13/2020 1.3 (H)  0.3 - 1.2 mg/dL Final   GFR, Estimated 11/13/2020 >60  >60 mL/min Final   Comment: (NOTE) Calculated using the CKD-EPI Creatinine Equation (2021)    Anion gap 11/13/2020 11  5 - 15 Final   Performed at [facility], [address], [place], [place] [address]   Alcohol, Ethyl (B) 11/13/2020 <10  <10 mg/dL Final   Comment: (NOTE) Lowest detectable limit for serum alcohol is 10 mg/dL.  For medical purposes only. Performed at [facility], [address] [place], [place] [address]    WBC 11/13/2020 7.1  4.0 - 10.5 K/uL Final   RBC 11/13/2020 5.02  4.22 - 5.81 MIL/uL Final   Hemoglobin 11/13/2020 16.0  13.0 - 17.0 g/dL Final   HCT 30/86/5784 46.2  39.0 - 52.0 % Final   MCV 11/13/2020 92.0  80.0 - 100.0 fL Final   MCH 11/13/2020 31.9  26.0 - 34.0 pg Final   MCHC 11/13/2020 34.6  30.0 - 36.0 g/dL Final   RDW 69/62/9528 13.6  11.5 - 15.5 % Final   Platelets 11/13/2020 269  150 - 400 K/uL Final   nRBC 11/13/2020 0.0  0.0 - 0.2 % Final   Performed at [facility], [address] [place], [place] [address]   Opiates 11/13/2020 NONE DETECTED  NONE DETECTED Final   Cocaine 11/13/2020  NONE DETECTED  NONE DETECTED Final   Benzodiazepines 11/13/2020 NONE DETECTED  NONE DETECTED Final   Amphetamines 11/13/2020 NONE DETECTED  NONE DETECTED Final   Tetrahydrocannabinol 11/13/2020 NONE DETECTED  NONE DETECTED Final   Barbiturates 11/13/2020 NONE DETECTED  NONE DETECTED Final   Comment: (NOTE) DRUG SCREEN FOR MEDICAL PURPOSES ONLY.  IF CONFIRMATION IS NEEDED FOR ANY PURPOSE, NOTIFY LAB WITHIN 5 DAYS.  LOWEST DETECTABLE LIMITS FOR URINE DRUG SCREEN Drug Class                     Cutoff (ng/mL) Amphetamine and metabolites    1000 Barbiturate and metabolites    200 Benzodiazepine                 200 Tricyclics and metabolites     300 Opiates and metabolites        300 Cocaine and metabolites        300 THC                            50 Performed at [facility], [address], [place], [place] [address]    Acetaminophen (Tylenol), Serum 11/13/2020 <10 (L)  10 - 30 ug/mL Final   Comment: (NOTE) Therapeutic concentrations vary significantly. A range of 10-30 ug/mL  may be an effective concentration for many patients. However, some  are best treated at concentrations outside of this range. Acetaminophen concentrations >150 ug/mL at 4 hours after ingestion  and >50 ug/mL at 12 hours after ingestion are often associated with  toxic reactions.  Performed at [facility], [address] [place], [place] [address]    Salicylate Lvl 11/13/2020 <7.0 (L)  7.0 - 30.0 mg/dL  Final   Performed at [facility], [address], [place], [place] [address]   SARS Coronavirus 2 by RT PCR 11/13/2020 NEGATIVE  NEGATIVE Final   Comment: (NOTE) SARS-CoV-2 target nucleic acids are NOT DETECTED.  The SARS-CoV-2 RNA is generally detectable in upper respiratory specimens during the acute phase of infection. The lowest concentration of SARS-CoV-2 viral copies this assay can detect is 138 copies/mL. A negative result does not preclude SARS-Cov-2 infection and should not  be used as the sole basis for treatment or other patient management decisions. A negative result may occur with  improper specimen collection/handling, submission of specimen other than nasopharyngeal swab, presence of viral mutation(s) within the areas targeted by this assay, and inadequate number of viral copies(<138 copies/mL). A negative result must be combined with clinical observations, patient history, and epidemiological information. The expected result is Negative.  Fact Sheet for Patients:  BloggerCourse.com  Fact Sheet for Healthcare Providers:  SeriousBroker.it  This test is no                          t yet approved or cleared by the [place] FDA and  has been authorized for detection and/or diagnosis of SARS-CoV-2 by FDA under an Emergency Use Authorization (EUA). This EUA will remain  in effect (meaning this test can be used) for the duration of the COVID-19 declaration under Section 564(b)(1) of the Act, 21 U.S.C.section 360bbb-3(b)(1), unless the authorization is terminated  or revoked sooner.       Influenza A by PCR 11/13/2020 NEGATIVE  NEGATIVE Final   Influenza B by PCR 11/13/2020 NEGATIVE  NEGATIVE Final   Comment: (NOTE) The Xpert Xpress SARS-CoV-2/FLU/RSV plus assay is intended as an aid in the diagnosis of influenza from Nasopharyngeal swab specimens and should not be used as a sole basis for treatment. Nasal washings and aspirates are unacceptable for Xpert Xpress SARS-CoV-2/FLU/RSV testing.  Fact Sheet for Patients: BloggerCourse.com  Fact Sheet for Healthcare Providers: SeriousBroker.it  This test is not yet approved or cleared by the [place] FDA and has been authorized for detection and/or diagnosis of SARS-CoV-2 by FDA under an Emergency Use Authorization (EUA). This EUA will remain in effect (meaning this test can be used) for the  duration of the COVID-19 declaration under Section 564(b)(1) of the Act, 21 U.S.C. section 360bbb-3(b)(1), unless the authorization is terminated or revoked.  Performed at [facility], [address] [place], [place] [address]   Admission on 10/12/2020, Discharged on 10/16/2020  Component Date Value Ref Range Status   Valproic Acid Lvl 10/12/2020 29 (L)  50.0 - 100.0 ug/mL Final   Performed at [facility], [address] [place], [place] [address]  Admission on 10/11/2020, Discharged on 10/12/2020  Component Date Value Ref Range Status   SARS Coronavirus 2 by RT PCR 10/11/2020 NEGATIVE  NEGATIVE Final   Comment: (NOTE) SARS-CoV-2 target nucleic acids are NOT DETECTED.  The SARS-CoV-2 RNA is generally detectable in upper respiratory specimens during the acute phase of infection. The lowest concentration of SARS-CoV-2 viral copies this assay can detect is 138 copies/mL. A negative result does not preclude SARS-Cov-2 infection and should not be used as the sole basis for treatment or other patient management decisions. A negative result may occur with  improper specimen collection/handling, submission of specimen other than nasopharyngeal swab, presence of viral mutation(s) within the areas targeted by this assay, and inadequate number of viral  copies(<138 copies/mL). A negative result must be combined with clinical observations, patient history, and epidemiological information. The expected result is Negative.  Fact Sheet for Patients:  BloggerCourse.com  Fact Sheet for Healthcare Providers:  SeriousBroker.it  This test is no                          t yet approved or cleared by the [place] FDA and  has been authorized for detection and/or diagnosis of SARS-CoV-2 by FDA under an Emergency Use Authorization (EUA). This EUA will remain  in effect (meaning this test can be used) for the duration of  the COVID-19 declaration under Section 564(b)(1) of the Act, 21 U.S.C.section 360bbb-3(b)(1), unless the authorization is terminated  or revoked sooner.       Influenza A by PCR 10/11/2020 NEGATIVE  NEGATIVE Final   Influenza B by PCR 10/11/2020 NEGATIVE  NEGATIVE Final   Comment: (NOTE) The Xpert Xpress SARS-CoV-2/FLU/RSV plus assay is intended as an aid in the diagnosis of influenza from Nasopharyngeal swab specimens and should not be used as a sole basis for treatment. Nasal washings and aspirates are unacceptable for Xpert Xpress SARS-CoV-2/FLU/RSV testing.  Fact Sheet for Patients: BloggerCourse.com  Fact Sheet for Healthcare Providers: SeriousBroker.it  This test is not yet approved or cleared by the [place] FDA and has been authorized for detection and/or diagnosis of SARS-CoV-2 by FDA under an Emergency Use Authorization (EUA). This EUA will remain in effect (meaning this test can be used) for the duration of the COVID-19 declaration under Section 564(b)(1) of the Act, 21 U.S.C. section 360bbb-3(b)(1), unless the authorization is terminated or revoked.  Performed at [facility], [address], [place], [place] [address]    Sodium 10/11/2020 132 (L)  135 - 145 mmol/L Final   Potassium 10/11/2020 3.5  3.5 - 5.1 mmol/L Final   Chloride 10/11/2020 99  98 - 111 mmol/L Final   CO2 10/11/2020 23  22 - 32 mmol/L Final   Glucose, Bld 10/11/2020 134 (H)  70 - 99 mg/dL Final   Glucose reference range applies only to samples taken after fasting for at least 8 hours.   BUN 10/11/2020 11  6 - 20 mg/dL Final   Creatinine, Ser 10/11/2020 0.78  0.61 - 1.24 mg/dL Final   Calcium 60/45/4098 9.0  8.9 - 10.3 mg/dL Final   Total Protein 11/91/4782 6.9  6.5 - 8.1 g/dL Final   Albumin 95/62/1308 4.2  3.5 - 5.0 g/dL Final   AST 65/78/4696 24  15 - 41 U/L Final   ALT 10/11/2020 21  0 - 44 U/L Final   Alkaline Phosphatase  10/11/2020 77  38 - 126 U/L Final   Total Bilirubin 10/11/2020 1.1  0.3 - 1.2 mg/dL Final   GFR, Estimated 10/11/2020 >60  >60 mL/min Final   Comment: (NOTE) Calculated using the CKD-EPI Creatinine Equation (2021)    Anion gap 10/11/2020 10  5 - 15 Final   Performed at [facility], [address] [place], [place] [address]   Alcohol, Ethyl (B) 10/11/2020 <10  <10 mg/dL Final   Comment: (NOTE) Lowest detectable limit for serum alcohol is 10 mg/dL.  For medical purposes only. Performed at [facility], [address] [place], [place] [address]    Opiates 10/11/2020 NONE DETECTED  NONE DETECTED Final   Cocaine 10/11/2020 NONE DETECTED  NONE DETECTED Final   Benzodiazepines 10/11/2020 NONE DETECTED  NONE  DETECTED Final   Amphetamines 10/11/2020 NONE DETECTED  NONE DETECTED Final   Tetrahydrocannabinol 10/11/2020 NONE DETECTED  NONE DETECTED Final   Barbiturates 10/11/2020 NONE DETECTED  NONE DETECTED Final   Comment: (NOTE) DRUG SCREEN FOR MEDICAL PURPOSES ONLY.  IF CONFIRMATION IS NEEDED FOR ANY PURPOSE, NOTIFY LAB WITHIN 5 DAYS.  LOWEST DETECTABLE LIMITS FOR URINE DRUG SCREEN Drug Class                     Cutoff (ng/mL) Amphetamine and metabolites    1000 Barbiturate and metabolites    200 Benzodiazepine                 200 Tricyclics and metabolites     300 Opiates and metabolites        300 Cocaine and metabolites        300 THC                            50 Performed at [facility], [address], [place], [place] [address]    WBC 10/11/2020 10.3  4.0 - 10.5 K/uL Final   RBC 10/11/2020 4.48  4.22 - 5.81 MIL/uL Final   Hemoglobin 10/11/2020 13.7  13.0 - 17.0 g/dL Final   HCT 60/45/4098 39.6  39.0 - 52.0 % Final   MCV 10/11/2020 88.4  80.0 - 100.0 fL Final   MCH 10/11/2020 30.6  26.0 - 34.0 pg Final   MCHC 10/11/2020 34.6  30.0 - 36.0 g/dL Final   RDW 11/91/4782 12.6  11.5 - 15.5 % Final   Platelets 10/11/2020 362  150 - 400 K/uL Final   nRBC  10/11/2020 0.0  0.0 - 0.2 % Final   Neutrophils Relative % 10/11/2020 73  % Final   Neutro Abs 10/11/2020 7.4  1.7 - 7.7 K/uL Final   Lymphocytes Relative 10/11/2020 15  % Final   Lymphs Abs 10/11/2020 1.5  0.7 - 4.0 K/uL Final   Monocytes Relative 10/11/2020 10  % Final   Monocytes Absolute 10/11/2020 1.1 (H)  0.1 - 1.0 K/uL Final   Eosinophils Relative 10/11/2020 2  % Final   Eosinophils Absolute 10/11/2020 0.2  0.0 - 0.5 K/uL Final   Basophils Relative 10/11/2020 0  % Final   Basophils Absolute 10/11/2020 0.0  0.0 - 0.1 K/uL Final   Immature Granulocytes 10/11/2020 0  % Final   Abs Immature Granulocytes 10/11/2020 0.04  0.00 - 0.07 K/uL Final   Performed at [facility], [address], [place], [place] [address]   Salicylate Lvl 10/11/2020 <7.0 (L)  7.0 - 30.0 mg/dL Final   Performed at [facility], [address] [place], [place] [address]   Acetaminophen (Tylenol), Serum 10/11/2020 <10 (L)  10 - 30 ug/mL Final   Comment: (NOTE) Therapeutic concentrations vary significantly. A range of 10-30 ug/mL  may be an effective concentration for many patients. However, some  are best treated at concentrations outside of this range. Acetaminophen concentrations >150 ug/mL at 4 hours after ingestion  and >50 ug/mL at 12 hours after ingestion are often associated with  toxic reactions.  Performed at [facility], [address] [place], [place] [address]   Admission on 10/06/2020, Discharged on 10/07/2020  Component Date Value Ref Range Status   WBC 10/06/2020 7.1  4.0 - 10.5 K/uL Final   RBC 10/06/2020 4.45  4.22 - 5.81 MIL/uL Final  Hemoglobin 10/06/2020 13.7  13.0 - 17.0 g/dL Final   HCT 16/96/7893 40.3  39.0 - 52.0 % Final   MCV 10/06/2020 90.6  80.0 - 100.0 fL Final   MCH 10/06/2020 30.8  26.0 - 34.0 pg Final   MCHC 10/06/2020 34.0  30.0 - 36.0 g/dL Final   RDW 81/03/7508 12.7  11.5 - 15.5 % Final   Platelets 10/06/2020 286  150 - 400 K/uL Final   nRBC  10/06/2020 0.0  0.0 - 0.2 % Final   Performed at [facility], [address] [place], [place] [address]   Salicylate Lvl 10/06/2020 <7.0 (L)  7.0 - 30.0 mg/dL Final   Performed at [facility], [address], [place], [place] [address]   Acetaminophen (Tylenol), Serum 10/06/2020 <10 (L)  10 - 30 ug/mL Final   Comment: (NOTE) Therapeutic concentrations vary significantly. A range of 10-30 ug/mL  may be an effective concentration for many patients. However, some  are best treated at concentrations outside of this range. Acetaminophen concentrations >150 ug/mL at 4 hours after ingestion  and >50 ug/mL at 12 hours after ingestion are often associated with  toxic reactions.  Performed at [facility], [address] [place], [place] [address]    Sodium 10/06/2020 137  135 - 145 mmol/L Final   Potassium 10/06/2020 3.1 (L)  3.5 - 5.1 mmol/L Final   Chloride 10/06/2020 100  98 - 111 mmol/L Final   CO2 10/06/2020 25  22 - 32 mmol/L Final   Glucose, Bld 10/06/2020 104 (H)  70 - 99 mg/dL Final   Glucose reference range applies only to samples taken after fasting for at least 8 hours.   BUN 10/06/2020 12  6 - 20 mg/dL Final   Creatinine, Ser 10/06/2020 0.62  0.61 - 1.24 mg/dL Final   Calcium 14/43/1540 8.9  8.9 - 10.3 mg/dL Final   Total Protein 08/67/6195 7.5  6.5 - 8.1 g/dL Final   Albumin 09/32/6712 4.9  3.5 - 5.0 g/dL Final   AST 45/80/9983 26  15 - 41 U/L Final   ALT 10/06/2020 31  0 - 44 U/L Final   Alkaline Phosphatase 10/06/2020 60  38 - 126 U/L Final   Total Bilirubin 10/06/2020 1.3 (H)  0.3 - 1.2 mg/dL Final   GFR, Estimated 10/06/2020 >60  >60 mL/min Final   Comment: (NOTE) Calculated using the CKD-EPI Creatinine Equation (2021)    Anion gap 10/06/2020 12  5 - 15 Final   Performed at [facility], [address], [place], [place] [address]   Alcohol, Ethyl (B) 10/06/2020 <10  <10 mg/dL Final    Comment: (NOTE) Lowest detectable limit for serum alcohol is 10 mg/dL.  For medical purposes only. Performed at [facility], [address], [place], [place] [address]    SARS Coronavirus 2 by RT PCR 10/06/2020 NEGATIVE  NEGATIVE Final   Comment: (NOTE) SARS-CoV-2 target nucleic acids are NOT DETECTED.  The SARS-CoV-2 RNA is generally detectable in upper respiratory specimens during the acute phase of infection. The lowest concentration of SARS-CoV-2 viral copies this assay can detect is 138 copies/mL. A negative result does not preclude SARS-Cov-2 infection and should not be used as the sole basis for treatment or other patient management decisions. A negative result may occur with  improper specimen collection/handling, submission of specimen other than nasopharyngeal swab, presence of viral mutation(s) within the areas targeted by this assay, and inadequate number of  viral copies(<138 copies/mL). A negative result must be combined with clinical observations, patient history, and epidemiological information. The expected result is Negative.  Fact Sheet for Patients:  BloggerCourse.com  Fact Sheet for Healthcare Providers:  SeriousBroker.it  This test is no                          t yet approved or cleared by the [place] FDA and  has been authorized for detection and/or diagnosis of SARS-CoV-2 by FDA under an Emergency Use Authorization (EUA). This EUA will remain  in effect (meaning this test can be used) for the duration of the COVID-19 declaration under Section 564(b)(1) of the Act, 21 U.S.C.section 360bbb-3(b)(1), unless the authorization is terminated  or revoked sooner.       Influenza A by PCR 10/06/2020 NEGATIVE  NEGATIVE Final   Influenza B by PCR 10/06/2020 NEGATIVE  NEGATIVE Final   Comment: (NOTE) The Xpert Xpress SARS-CoV-2/FLU/RSV plus assay is intended as an aid in the diagnosis of  influenza from Nasopharyngeal swab specimens and should not be used as a sole basis for treatment. Nasal washings and aspirates are unacceptable for Xpert Xpress SARS-CoV-2/FLU/RSV testing.  Fact Sheet for Patients: BloggerCourse.com  Fact Sheet for Healthcare Providers: SeriousBroker.it  This test is not yet approved or cleared by the [place] FDA and has been authorized for detection and/or diagnosis of SARS-CoV-2 by FDA under an Emergency Use Authorization (EUA). This EUA will remain in effect (meaning this test can be used) for the duration of the COVID-19 declaration under Section 564(b)(1) of the Act, 21 U.S.C. section 360bbb-3(b)(1), unless the authorization is terminated or revoked.  Performed at [facility], [address] [place], [place] [address]    Opiates 10/07/2020 NONE DETECTED  NONE DETECTED Final   Cocaine 10/07/2020 NONE DETECTED  NONE DETECTED Final   Benzodiazepines 10/07/2020 NONE DETECTED  NONE DETECTED Final   Amphetamines 10/07/2020 POSITIVE (A)  NONE DETECTED Final   Tetrahydrocannabinol 10/07/2020 NONE DETECTED  NONE DETECTED Final   Barbiturates 10/07/2020 NONE DETECTED  NONE DETECTED Final   Comment: (NOTE) DRUG SCREEN FOR MEDICAL PURPOSES ONLY.  IF CONFIRMATION IS NEEDED FOR ANY PURPOSE, NOTIFY LAB WITHIN 5 DAYS.  LOWEST DETECTABLE LIMITS FOR URINE DRUG SCREEN Drug Class                     Cutoff (ng/mL) Amphetamine and metabolites    1000 Barbiturate and metabolites    200 Benzodiazepine                 200 Tricyclics and metabolites     300 Opiates and metabolites        300 Cocaine and metabolites        300 THC                            50 Performed at [facility], [address], [place], [place] [address]     Allergies: Erythromycin  PTA Medications:  Prior to Admission medications   Medication Sig Start Date End Date Taking?  Authorizing Provider  albuterol (VENTOLIN HFA) 108 (90 Base) MCG/ACT inhaler Inhale 1-2 puffs into the lungs every 4 (four) hours as needed for wheezing or shortness of breath. 01/16/21 04/04/21  Estella Husk, MD  FLUoxetine (PROZAC) 40 MG capsule Take 1 capsule (40 mg total) by mouth daily. 01/17/21  Estella Husk, MD  hydrOXYzine (ATARAX/VISTARIL) 25 MG tablet Take 1 tablet (25 mg total) by mouth every 6 (six) hours as needed for anxiety. 01/16/21   Estella Husk, MD  Multiple Vitamin (MULTIVITAMIN WITH MINERALS) TABS tablet Take 1 tablet by mouth daily. Patient not taking: Reported on 04/04/2021 01/17/21   Estella Husk, MD  nicotine (NICODERM CQ - DOSED IN MG/24 HOURS) 21 mg/24hr patch Place 1 patch (21 mg total) onto the skin daily at 6 (six) AM. Patient not taking: Reported on 04/04/2021 01/17/21   Estella Husk, MD  OLANZapine (ZYPREXA) 10 MG tablet Take 1 tablet (10 mg total) by mouth at bedtime. 01/16/21   Estella Husk, MD  thiamine 100 MG tablet Take 1 tablet (100 mg total) by mouth daily. Patient not taking: Reported on 04/04/2021 01/17/21   Estella Husk, MD  traZODone (DESYREL) 50 MG tablet Take 1 tablet (50 mg total) by mouth at bedtime as needed for sleep. Patient not taking: Reported on 04/04/2021 01/16/21   Estella Husk, MD      Medical Decision Making  Patient admitted to continuous assessment for crisis stabilization.  Patient was medically cleared in the ED.  PTA medications were restarted in the emergency department. Continue fluoxetine 40 mg daily for depression Continue Zyprexa 10 mg nightly for mood stability  Initiate CIWA protocol -lorazepam 1 mg every 6 hours prn for CIWA >10 -thiamine 100 mg daily for nutritional supplementation -hydroxyzine 25 mg every 6 hours prn for anxiety, CIWA < or = 10 -ondansetron 4 mg ODT every 6 hours prn nausea/vomiting -loperamide 2-4 mg capsule prn diarrhea or loose stools         Recommendations  Based on my evaluation the patient does not appear to have an emergency medical condition.  Jackelyn Poling, NP 04/05/21  12:28 AM

## 2021-04-05 NOTE — ED Notes (Addendum)
°  Mr. Brett House was sent to [facility] from [facility] ED after medical clearance was completed for suicidal ideation and depression related to polysubstance abuse. Mr Brett House present with a flat affect that somewhat brightens on approach and a quiet but cooperative mood. After receiving a snack Mr Brett House went to bed and to sleep.

## 2021-04-05 NOTE — ED Notes (Signed)
Pt sleeping@this time. Breathing even and unlabored. Will continue to monitor for safety 

## 2021-04-05 NOTE — ED Notes (Signed)
Pt currently sleeping.  Breathing even and unlabored.  Will monitor for safety.

## 2021-04-05 NOTE — ED Notes (Signed)
PT is in room sleeping, respirations are even/unlabored, will continue to monitor patient for safety.

## 2021-04-05 NOTE — Progress Notes (Signed)
Patient has been asleep in bed all day.  He has visible fine tremors bilaterally and tongue vesiculation his ciwa at this time is 2.  He is calm and in good behavioral control.  No nausea or vomiting and no visual disturbance. Patient has been encouraged to increase PO fluids.   He presently denies avh shi or plan.  Will monitor and encourage him to seek out staff if overwhelmed.

## 2021-04-05 NOTE — Progress Notes (Signed)
Pt's CIWA was 4. 

## 2021-04-05 NOTE — ED Notes (Signed)
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  He has agreed to seek out staff if overwhelmed by thoughts or feelings or if he feels like self harming.  He was oriented to unit and shown to his room.  Skin check competed by Barrister's clerk.  He is on CIWA and presently it is at 4.  He has slight bilateral tremors and tongue vesiculation.  Patient was given breakfast and has now returned to his room to sleep.  Will continue to monitor and provide safety on unit.

## 2021-04-05 NOTE — BH Assessment (Signed)
LCSW Initial Note  Brett House shared that he came to the [facility] seeking assistance with medication adjustments and potential residential treatment services for substance abuse.  Brett House reports that he was experiencing worsening depression with suicidal ideation. Brett House endorsed drinking ETOH on a daily basis. He reports drinking a case of beer daily. He also shared that he uses cocaine occasionally, however has not used any "in a while". Brett House's UDS was positive for cocaine, amphetamines and THC.    Brett House shared that he had an upcoming court date on 04/29/21, however he continued to seek possible residential treatment for his substance abuse. LCSW utilized the [employer] website to verify the patient's court date.   LCSW informed Brett House that his actual court date was next Tuesday, 04/09/2021 for various charges. He also has a court date on 05/27/2021 for an additional charge. Brett House shared that he was unaware of his the dates presented, however was receptive.   LCSW informed Brett House that his upcoming court dates could be a barrier for placement. Brett House expressed understanding and shared that he would attempt to contact his Arts administrator. LCSW provided the patient with the contact information for his attorney.   Brett House's attorney/public Charity fundraiser is Brett House [phone]) with [employer].   Brett House shared that he would contact his attorney Landscape architect) to determine if he could have his court cases delayed, in order for him to participate in residential substance abuse treatment services.   LCSW will continue to follow for any updates, additional questions, concerns or identified needs.   [name], MSW, LCSW Clinical Child psychotherapist (Facility Based Crisis) [facility]

## 2021-04-05 NOTE — ED Notes (Signed)
Patient did not attend wrap up group due to him wanting to sleep, even after encouragement from staff.

## 2021-04-05 NOTE — Progress Notes (Signed)
Pt is transferred to [facility]. Report given to RN.

## 2021-04-05 NOTE — ED Provider Notes (Signed)
Behavioral Health Progress Note  Date and Time: 04/05/2021 8:51 AM Name: Brett House MRN:  [id number]  Subjective:  Patient states "I had decided to end it this time." Patient reports worsening depression for several weeks.  He reports feelings of hopelessness.  Patient reports he had plans of suicide and plan to hang himself however came for help.  He shares that he believes his medication should be updated.  Most recently his medications have been prescribed in urgent care at [facility] behavioral health and emergency departments.  He is not linked with outpatient psychiatry but would like to follow-up with outpatient psychiatry moving forward.  He endorses a desire to seek help for ongoing mental health concerns.  Brett House is reassessed, face-to-face, by nurse practitioner.  He is reclined in observation unit, appears asleep.  He is easily awakened.  He is alert and oriented, pleasant and cooperative during assessment.  He continues to endorse passive suicidal ideation.  He denies homicidal ideation at this time.  He contracts verbally for safety with this provider while on the unit.  He presents with depressed mood, congruent affect.  His speech and behavior are appropriate.  He denies auditory and visual hallucinations.  There is no evidence of delusional thought content and no indication that patient is responding to internal stimuli.  Patient offered support and encouragement.  He agrees with plan to transfer to facility based crisis unit at [facility] health for continued treatment and stabilization.  Patient remains voluntary at this time.  Diagnosis:  Final diagnoses:  Bipolar I disorder, most recent episode depressed (HCC)  Alcohol use disorder, severe, dependence (HCC)    Total Time spent with patient: 20 minutes  Past Psychiatric History: Bipolar 1 disorder, generalized anxiety disorder, major depressive disorder, alcohol use disorder, suicidal ideation Past  Medical History:  Past Medical History:  Diagnosis Date   Anxiety    Asthma    Bipolar disorder (HCC)    Depression    No past surgical history on file. Family History: No family history on file. Family Psychiatric  History: None reported Social History:  Social History   Substance and Sexual Activity  Alcohol Use Yes   Comment: 6 pack once a week for last 8-9 months     Social History   Substance and Sexual Activity  Drug Use Yes   Types: Marijuana   Comment: rarely    Social History   Socioeconomic History   Marital status: Single    Spouse name: Not on file   Number of children: 0   Years of education: Not on file   Highest education level: 12th grade  Occupational History   Occupation: Unemployed  Tobacco Use   Smoking status: Every Day    Packs/day: 1.50    Years: 15.00    Pack years: 22.50    Types: Cigarettes   Smokeless tobacco: Current  Vaping Use   Vaping Use: Never used  Substance and Sexual Activity   Alcohol use: Yes    Comment: 6 pack once a week for last 8-9 months   Drug use: Yes    Types: Marijuana    Comment: rarely   Sexual activity: Yes  Other Topics Concern   Not on file  Social History Narrative   Not on file   Social Determinants of Health   Financial Resource Strain: Not on file  Food Insecurity: Not on file  Transportation Needs: Not on file  Physical Activity: Not on file  Stress: Not on  file  Social Connections: Not on file   SDOH:  SDOH Screenings   Alcohol Screen: Low Risk    Last Alcohol Screening Score (AUDIT): 0  Depression (PHQ2-9): Not on file  Financial Resource Strain: Not on file  Food Insecurity: Not on file  Housing: Not on file  Physical Activity: Not on file  Social Connections: Not on file  Stress: Not on file  Tobacco Use: High Risk   Smoking Tobacco Use: Every Day   Smokeless Tobacco Use: Current   Passive Exposure: Not on file  Transportation Needs: Not on file   Additional Social History:                          Sleep: Fair  Appetite:  Fair  Current Medications:  Current Facility-Administered Medications  Medication Dose Route Frequency Provider Last Rate Last Admin   acetaminophen (TYLENOL) tablet 650 mg  650 mg Oral Q6H PRN Jackelyn Poling, NP       alum & mag hydroxide-simeth (MAALOX/MYLANTA) 200-200-20 MG/5ML suspension 30 mL  30 mL Oral Q4H PRN Jackelyn Poling, NP       FLUoxetine (PROZAC) capsule 40 mg  40 mg Oral Daily [name] A, NP   40 mg at 04/05/21 0847   folic acid (FOLVITE) tablet 1 mg  1 mg Oral Daily [name] A, NP   1 mg at 04/05/21 0847   loperamide (IMODIUM) capsule 2-4 mg  2-4 mg Oral PRN Jackelyn Poling, NP       LORazepam (ATIVAN) tablet 1 mg  1 mg Oral Q6H PRN Jackelyn Poling, NP       magnesium hydroxide (MILK OF MAGNESIA) suspension 30 mL  30 mL Oral Daily PRN Jackelyn Poling, NP       multivitamin with minerals tablet 1 tablet  1 tablet Oral Daily [name] A, NP   1 tablet at 04/05/21 0847   OLANZapine (ZYPREXA) tablet 10 mg  10 mg Oral QHS [name] A, NP       ondansetron (ZOFRAN-ODT) disintegrating tablet 4 mg  4 mg Oral Q6H PRN Jackelyn Poling, NP       [START ON 04/06/2021] thiamine tablet 100 mg  100 mg Oral Daily Jackelyn Poling, NP       Current Outpatient Medications  Medication Sig Dispense Refill   albuterol (VENTOLIN HFA) 108 (90 Base) MCG/ACT inhaler Inhale 1-2 puffs into the lungs every 4 (four) hours as needed for wheezing or shortness of breath. 1 each 1   FLUoxetine (PROZAC) 40 MG capsule Take 1 capsule (40 mg total) by mouth daily. 30 capsule 1   hydrOXYzine (ATARAX/VISTARIL) 25 MG tablet Take 1 tablet (25 mg total) by mouth every 6 (six) hours as needed for anxiety. 30 tablet 1   Multiple Vitamin (MULTIVITAMIN WITH MINERALS) TABS tablet Take 1 tablet by mouth daily. (Patient not taking: Reported on 04/04/2021) 30 tablet 1   nicotine (NICODERM CQ - DOSED IN MG/24 HOURS) 21 mg/24hr patch Place 1 patch (21 mg total)  onto the skin daily at 6 (six) AM. (Patient not taking: Reported on 04/04/2021) 28 patch 1   OLANZapine (ZYPREXA) 10 MG tablet Take 1 tablet (10 mg total) by mouth at bedtime. 30 tablet 1   thiamine 100 MG tablet Take 1 tablet (100 mg total) by mouth daily. (Patient not taking: Reported on 04/04/2021) 30 tablet 1   traZODone (DESYREL) 50 MG tablet Take 1  tablet (50 mg total) by mouth at bedtime as needed for sleep. (Patient not taking: Reported on 04/04/2021) 30 tablet 1    Labs  Lab Results:  Admission on 04/04/2021, Discharged on 04/04/2021  Component Date Value Ref Range Status   Sodium 04/04/2021 140  135 - 145 mmol/L Final   Potassium 04/04/2021 3.3 (L)  3.5 - 5.1 mmol/L Final   Chloride 04/04/2021 100  98 - 111 mmol/L Final   CO2 04/04/2021 28  22 - 32 mmol/L Final   Glucose, Bld 04/04/2021 84  70 - 99 mg/dL Final   Glucose reference range applies only to samples taken after fasting for at least 8 hours.   BUN 04/04/2021 6  6 - 20 mg/dL Final   Creatinine, Ser 04/04/2021 0.82  0.61 - 1.24 mg/dL Final   Calcium 40/98/1191 9.3  8.9 - 10.3 mg/dL Final   Total Protein 47/82/9562 6.9  6.5 - 8.1 g/dL Final   Albumin 13/10/6576 4.3  3.5 - 5.0 g/dL Final   AST 46/96/2952 28  15 - 41 U/L Final   ALT 04/04/2021 20  0 - 44 U/L Final   Alkaline Phosphatase 04/04/2021 86  38 - 126 U/L Final   Total Bilirubin 04/04/2021 1.2  0.3 - 1.2 mg/dL Final   GFR, Estimated 04/04/2021 >60  >60 mL/min Final   Comment: (NOTE) Calculated using the CKD-EPI Creatinine Equation (2021)    Anion gap 04/04/2021 12  5 - 15 Final   Performed at [facility], [address] [place], [place] [address]   Alcohol, Ethyl (B) 04/04/2021 42 (H)  <10 mg/dL Final   Comment: (NOTE) Lowest detectable limit for serum alcohol is 10 mg/dL.  For medical purposes only. Performed at [facility], [address], [place], [place] [address]    Salicylate Lvl 04/04/2021 <7.0 (L)  7.0 - 30.0 mg/dL Final   Performed at  [facility], [address], [place], [place] [address]   Acetaminophen (Tylenol), Serum 04/04/2021 <10 (L)  10 - 30 ug/mL Final   Comment: (NOTE) Therapeutic concentrations vary significantly. A range of 10-30 ug/mL  may be an effective concentration for many patients. However, some  are best treated at concentrations outside of this range. Acetaminophen concentrations >150 ug/mL at 4 hours after ingestion  and >50 ug/mL at 12 hours after ingestion are often associated with  toxic reactions.  Performed at [facility], [address], [place], [place] [address]    WBC 04/04/2021 10.0  4.0 - 10.5 K/uL Final   RBC 04/04/2021 5.06  4.22 - 5.81 MIL/uL Final   Hemoglobin 04/04/2021 15.8  13.0 - 17.0 g/dL Final   HCT 34/74/2595 48.1  39.0 - 52.0 % Final   MCV 04/04/2021 95.1  80.0 - 100.0 fL Final   MCH 04/04/2021 31.2  26.0 - 34.0 pg Final   MCHC 04/04/2021 32.8  30.0 - 36.0 g/dL Final   RDW 63/87/5643 13.2  11.5 - 15.5 % Final   Platelets 04/04/2021 283  150 - 400 K/uL Final   nRBC 04/04/2021 0.0  0.0 - 0.2 % Final   Performed at [facility], [address] [place], [place] [address]   Opiates 04/04/2021 NONE DETECTED  NONE DETECTED Final   Cocaine 04/04/2021 POSITIVE (A)  NONE DETECTED Final   Benzodiazepines 04/04/2021 NONE DETECTED  NONE DETECTED Final   Amphetamines 04/04/2021 POSITIVE (A)  NONE DETECTED Final   Tetrahydrocannabinol 04/04/2021 POSITIVE (A)  NONE DETECTED Final   Barbiturates 04/04/2021 NONE DETECTED  NONE DETECTED Final   Comment: (NOTE) DRUG SCREEN FOR MEDICAL PURPOSES ONLY.  IF CONFIRMATION IS NEEDED FOR ANY PURPOSE, NOTIFY LAB WITHIN 5 DAYS.  LOWEST DETECTABLE LIMITS FOR URINE DRUG SCREEN Drug Class                     Cutoff (ng/mL) Amphetamine and metabolites    1000 Barbiturate and metabolites    200 Benzodiazepine                 200 Tricyclics and metabolites     300 Opiates and metabolites        300 Cocaine and metabolites         300 THC                            50 Performed at [facility], [address], [place], [place] [address]    SARS Coronavirus 2 by RT PCR 04/04/2021 NEGATIVE  NEGATIVE Final   Comment: (NOTE) SARS-CoV-2 target nucleic acids are NOT DETECTED.  The SARS-CoV-2 RNA is generally detectable in upper respiratory specimens during the acute phase of infection. The lowest concentration of SARS-CoV-2 viral copies this assay can detect is 138 copies/mL. A negative result does not preclude SARS-Cov-2 infection and should not be used as the sole basis for treatment or other patient management decisions. A negative result may occur with  improper specimen collection/handling, submission of specimen other than nasopharyngeal swab, presence of viral mutation(s) within the areas targeted by this assay, and inadequate number of viral copies(<138 copies/mL). A negative result must be combined with clinical observations, patient history, and epidemiological information. The expected result is Negative.  Fact Sheet for Patients:  BloggerCourse.com  Fact Sheet for Healthcare Providers:  SeriousBroker.it  This test is no                          t yet approved or cleared by the [place] FDA and  has been authorized for detection and/or diagnosis of SARS-CoV-2 by FDA under an Emergency Use Authorization (EUA). This EUA will remain  in effect (meaning this test can be used) for the duration of the COVID-19 declaration under Section 564(b)(1) of the Act, 21 U.S.C.section 360bbb-3(b)(1), unless the authorization is terminated  or revoked sooner.       Influenza A by PCR 04/04/2021 NEGATIVE  NEGATIVE Final   Influenza B by PCR 04/04/2021 NEGATIVE  NEGATIVE Final   Comment: (NOTE) The Xpert Xpress SARS-CoV-2/FLU/RSV plus assay is intended as an aid in the diagnosis of influenza from Nasopharyngeal swab specimens and should not be used  as a sole basis for treatment. Nasal washings and aspirates are unacceptable for Xpert Xpress SARS-CoV-2/FLU/RSV testing.  Fact Sheet for Patients: BloggerCourse.com  Fact Sheet for Healthcare Providers: SeriousBroker.it  This test is not yet approved or cleared by the [place] FDA and has been authorized for detection and/or diagnosis of SARS-CoV-2 by FDA under an Emergency Use Authorization (EUA). This EUA will remain in effect (meaning this test can be used) for the duration of the COVID-19 declaration under Section 564(b)(1) of the Act, 21 U.S.C. section 360bbb-3(b)(1), unless the authorization is terminated or revoked.  Performed at [facility], [address] [place], [place] [address]   Admission on 01/13/2021, Discharged on 01/16/2021  Component Date Value Ref  Range Status   Magnesium 01/12/2021 2.4  1.7 - 2.4 mg/dL Final   Performed at [facility], [address] [place], [place] [address]   Cholesterol 01/12/2021 198  0 - 200 mg/dL Final   Triglycerides 60/45/4098 281 (H)  <150 mg/dL Final   HDL 11/91/4782 76  >40 mg/dL Final   Total CHOL/HDL Ratio 01/12/2021 2.6  RATIO Final   VLDL 01/12/2021 56 (H)  0 - 40 mg/dL Final   LDL Cholesterol 01/12/2021 66  0 - 99 mg/dL Final   Comment:        Total Cholesterol/HDL:CHD Risk Coronary Heart Disease Risk Table                     Men   Women  1/2 Average Risk   3.4   3.3  Average Risk       5.0   4.4  2 X Average Risk   9.6   7.1  3 X Average Risk  23.4   11.0        Use the calculated Patient Ratio above and the CHD Risk Table to determine the patient's CHD Risk.        ATP III CLASSIFICATION (LDL):  <100     mg/dL   Optimal  956-213  mg/dL   Near or Above                    Optimal  130-159  mg/dL   Borderline  086-578  mg/dL   High  >469     mg/dL   Very High Performed at [facility], [address]  [place], [place] [address]    TSH 01/12/2021 1.069  0.350 - 4.500 uIU/mL Final   Comment: Performed by a 3rd Generation assay with a functional sensitivity of <=0.01 uIU/mL. Performed at [facility], [address], [place], [place] [address]    Sodium 01/16/2021 136  135 - 145 mmol/L Final   Potassium 01/16/2021 4.5  3.5 - 5.1 mmol/L Final   Chloride 01/16/2021 101  98 - 111 mmol/L Final   CO2 01/16/2021 27  22 - 32 mmol/L Final   Glucose, Bld 01/16/2021 114 (H)  70 - 99 mg/dL Final   Glucose reference range applies only to samples taken after fasting for at least 8 hours.   BUN 01/16/2021 11  6 - 20 mg/dL Final   Creatinine, Ser 01/16/2021 0.73  0.61 - 1.24 mg/dL Final   Calcium 44/03/270 9.0  8.9 - 10.3 mg/dL Final   GFR, Estimated 01/16/2021 >60  >60 mL/min Final   Comment: (NOTE) Calculated using the CKD-EPI Creatinine Equation (2021)    Anion gap 01/16/2021 8  5 - 15 Final   Performed at [facility], [address], [place], [place] [address]  Admission on 01/12/2021, Discharged on 01/13/2021  Component Date Value Ref Range Status   Sodium 01/12/2021 139  135 - 145 mmol/L Final   Potassium 01/12/2021 3.1 (L)  3.5 - 5.1 mmol/L Final   Chloride 01/12/2021 107  98 - 111 mmol/L Final   CO2 01/12/2021 23  22 - 32 mmol/L Final   Glucose, Bld 01/12/2021 107 (H)  70 - 99 mg/dL Final   Glucose reference range applies only to samples taken after fasting for at least 8 hours.   BUN 01/12/2021 6  6 - 20 mg/dL Final   Creatinine, Ser 01/12/2021 0.62  0.61 - 1.24 mg/dL  Final   Calcium 01/12/2021 8.3 (L)  8.9 - 10.3 mg/dL Final   Total Protein 16/12/9602 6.5  6.5 - 8.1 g/dL Final   Albumin 54/11/8117 4.1  3.5 - 5.0 g/dL Final   AST 14/78/2956 29  15 - 41 U/L Final   ALT 01/12/2021 22  0 - 44 U/L Final   Alkaline Phosphatase 01/12/2021 65  38 - 126 U/L Final   Total Bilirubin 01/12/2021 0.6  0.3 - 1.2 mg/dL Final   GFR, Estimated 01/12/2021 >60  >60 mL/min Final    Comment: (NOTE) Calculated using the CKD-EPI Creatinine Equation (2021)    Anion gap 01/12/2021 9  5 - 15 Final   Performed at [facility], [address] [place], [place] [address]   Alcohol, Ethyl (B) 01/12/2021 325 (HH)  <10 mg/dL Final   Comment: CRITICAL RESULT CALLED TO, READ BACK BY AND VERIFIED WITH: SONYA, RN @ 1624 ON 01/12/2021 BY LBROOKS, MLT (NOTE) Lowest detectable limit for serum alcohol is 10 mg/dL.  For medical purposes only. Performed at [facility], [address] [place], [place] [address]    Salicylate Lvl 01/12/2021 <7.0 (L)  7.0 - 30.0 mg/dL Final   Performed at [facility], [address], [place], [place] [address]   Acetaminophen (Tylenol), Serum 01/12/2021 <10 (L)  10 - 30 ug/mL Final   Comment: (NOTE) Therapeutic concentrations vary significantly. A range of 10-30 ug/mL  may be an effective concentration for many patients. However, some  are best treated at concentrations outside of this range. Acetaminophen concentrations >150 ug/mL at 4 hours after ingestion  and >50 ug/mL at 12 hours after ingestion are often associated with  toxic reactions.  Performed at [facility], [address], [place], [place] [address]    WBC 01/12/2021 9.3  4.0 - 10.5 K/uL Final   RBC 01/12/2021 4.86  4.22 - 5.81 MIL/uL Final   Hemoglobin 01/12/2021 15.3  13.0 - 17.0 g/dL Final   HCT 32/44/0102 43.6  39.0 - 52.0 % Final   MCV 01/12/2021 89.7  80.0 - 100.0 fL Final   MCH 01/12/2021 31.5  26.0 - 34.0 pg Final   MCHC 01/12/2021 35.1  30.0 - 36.0 g/dL Final   RDW 72/53/6644 13.2  11.5 - 15.5 % Final   Platelets 01/12/2021 271  150 - 400 K/uL Final   nRBC 01/12/2021 0.0  0.0 - 0.2 % Final   Performed at [facility], [address] [place], [place] [address]   Opiates 01/12/2021 NONE DETECTED  NONE DETECTED Final   Cocaine 01/12/2021 NONE DETECTED  NONE DETECTED Final    Benzodiazepines 01/12/2021 NONE DETECTED  NONE DETECTED Final   Amphetamines 01/12/2021 NONE DETECTED  NONE DETECTED Final   Tetrahydrocannabinol 01/12/2021 NONE DETECTED  NONE DETECTED Final   Barbiturates 01/12/2021 NONE DETECTED  NONE DETECTED Final   Comment: (NOTE) DRUG SCREEN FOR MEDICAL PURPOSES ONLY.  IF CONFIRMATION IS NEEDED FOR ANY PURPOSE, NOTIFY LAB WITHIN 5 DAYS.  LOWEST DETECTABLE LIMITS FOR URINE DRUG SCREEN Drug Class                     Cutoff (ng/mL) Amphetamine and metabolites    1000 Barbiturate and metabolites    200 Benzodiazepine                 200 Tricyclics and metabolites     300 Opiates and metabolites  300 Cocaine and metabolites        300 THC                            50 Performed at [facility], [address], [place], [place] [address]    SARS Coronavirus 2 by RT PCR 01/12/2021 NEGATIVE  NEGATIVE Final   Comment: (NOTE) SARS-CoV-2 target nucleic acids are NOT DETECTED.  The SARS-CoV-2 RNA is generally detectable in upper respiratory specimens during the acute phase of infection. The lowest concentration of SARS-CoV-2 viral copies this assay can detect is 138 copies/mL. A negative result does not preclude SARS-Cov-2 infection and should not be used as the sole basis for treatment or other patient management decisions. A negative result may occur with  improper specimen collection/handling, submission of specimen other than nasopharyngeal swab, presence of viral mutation(s) within the areas targeted by this assay, and inadequate number of viral copies(<138 copies/mL). A negative result must be combined with clinical observations, patient history, and epidemiological information. The expected result is Negative.  Fact Sheet for Patients:  BloggerCourse.com  Fact Sheet for Healthcare Providers:  SeriousBroker.it  This test is no                          t yet  approved or cleared by the [place] FDA and  has been authorized for detection and/or diagnosis of SARS-CoV-2 by FDA under an Emergency Use Authorization (EUA). This EUA will remain  in effect (meaning this test can be used) for the duration of the COVID-19 declaration under Section 564(b)(1) of the Act, 21 U.S.C.section 360bbb-3(b)(1), unless the authorization is terminated  or revoked sooner.       Influenza A by PCR 01/12/2021 NEGATIVE  NEGATIVE Final   Influenza B by PCR 01/12/2021 NEGATIVE  NEGATIVE Final   Comment: (NOTE) The Xpert Xpress SARS-CoV-2/FLU/RSV plus assay is intended as an aid in the diagnosis of influenza from Nasopharyngeal swab specimens and should not be used as a sole basis for treatment. Nasal washings and aspirates are unacceptable for Xpert Xpress SARS-CoV-2/FLU/RSV testing.  Fact Sheet for Patients: BloggerCourse.com  Fact Sheet for Healthcare Providers: SeriousBroker.it  This test is not yet approved or cleared by the [place] FDA and has been authorized for detection and/or diagnosis of SARS-CoV-2 by FDA under an Emergency Use Authorization (EUA). This EUA will remain in effect (meaning this test can be used) for the duration of the COVID-19 declaration under Section 564(b)(1) of the Act, 21 U.S.C. section 360bbb-3(b)(1), unless the authorization is terminated or revoked.  Performed at [facility], [address] [place], [place] [address]   Admission on 11/14/2020, Discharged on 11/19/2020  Component Date Value Ref Range Status   RPR Ser Ql 11/14/2020 NON REACTIVE  NON REACTIVE Final   Performed at [facility], [address] [place], [place] [address]   Hepatitis B Surface Ag 11/14/2020 NON REACTIVE  NON REACTIVE Final   HCV Ab 11/14/2020 NON REACTIVE  NON REACTIVE Final   Comment: (NOTE) Nonreactive HCV antibody screen is consistent with no HCV  infections,  unless recent infection is suspected or other evidence exists to indicate HCV infection.     Hep A IgM 11/14/2020 NON REACTIVE  NON REACTIVE Final   Hep B C IgM 11/14/2020 NON REACTIVE  NON REACTIVE Final   Performed at [facility], [address] [place], [place] [address]   HIV Screen 4th Generation wRfx 11/14/2020 Non Reactive  Non Reactive Final   Performed at [facility], [address], [place], [place] [address]  Admission on 11/13/2020, Discharged on 11/14/2020  Component Date Value Ref Range Status   Sodium 11/13/2020 136  135 - 145 mmol/L Final   Potassium 11/13/2020 3.8  3.5 - 5.1 mmol/L Final   Chloride 11/13/2020 101  98 - 111 mmol/L Final   CO2 11/13/2020 24  22 - 32 mmol/L Final   Glucose, Bld 11/13/2020 102 (H)  70 - 99 mg/dL Final   Glucose reference range applies only to samples taken after fasting for at least 8 hours.   BUN 11/13/2020 5 (L)  6 - 20 mg/dL Final   Creatinine, Ser 11/13/2020 0.69  0.61 - 1.24 mg/dL Final   Calcium 82/95/6213 9.3  8.9 - 10.3 mg/dL Final   Total Protein 08/65/7846 7.3  6.5 - 8.1 g/dL Final   Albumin 96/29/5284 4.5  3.5 - 5.0 g/dL Final   AST 13/24/4010 21  15 - 41 U/L Final   ALT 11/13/2020 14  0 - 44 U/L Final   Alkaline Phosphatase 11/13/2020 97  38 - 126 U/L Final   Total Bilirubin 11/13/2020 1.3 (H)  0.3 - 1.2 mg/dL Final   GFR, Estimated 11/13/2020 >60  >60 mL/min Final   Comment: (NOTE) Calculated using the CKD-EPI Creatinine Equation (2021)    Anion gap 11/13/2020 11  5 - 15 Final   Performed at [facility], [address], [place], [place] [address]   Alcohol, Ethyl (B) 11/13/2020 <10  <10 mg/dL Final   Comment: (NOTE) Lowest detectable limit for serum alcohol is 10 mg/dL.  For medical purposes only. Performed at [facility], [address], [place], [place] [address]    WBC 11/13/2020 7.1  4.0 - 10.5 K/uL Final   RBC 11/13/2020 5.02  4.22 - 5.81 MIL/uL Final   Hemoglobin  11/13/2020 16.0  13.0 - 17.0 g/dL Final   HCT 34/74/2595 46.2  39.0 - 52.0 % Final   MCV 11/13/2020 92.0  80.0 - 100.0 fL Final   MCH 11/13/2020 31.9  26.0 - 34.0 pg Final   MCHC 11/13/2020 34.6  30.0 - 36.0 g/dL Final   RDW 63/87/5643 13.6  11.5 - 15.5 % Final   Platelets 11/13/2020 269  150 - 400 K/uL Final   nRBC 11/13/2020 0.0  0.0 - 0.2 % Final   Performed at [facility], [address] [place], [place] [address]   Opiates 11/13/2020 NONE DETECTED  NONE DETECTED Final   Cocaine 11/13/2020 NONE DETECTED  NONE DETECTED Final   Benzodiazepines 11/13/2020 NONE DETECTED  NONE DETECTED Final   Amphetamines 11/13/2020 NONE DETECTED  NONE DETECTED Final   Tetrahydrocannabinol 11/13/2020 NONE DETECTED  NONE DETECTED Final   Barbiturates 11/13/2020 NONE DETECTED  NONE DETECTED Final   Comment: (NOTE) DRUG SCREEN FOR MEDICAL PURPOSES ONLY.  IF CONFIRMATION IS NEEDED FOR ANY PURPOSE, NOTIFY LAB WITHIN 5 DAYS.  LOWEST DETECTABLE LIMITS FOR URINE DRUG SCREEN Drug Class                     Cutoff (ng/mL) Amphetamine and metabolites    1000 Barbiturate and metabolites    200 Benzodiazepine                 200 Tricyclics and metabolites     300 Opiates  and metabolites        300 Cocaine and metabolites        300 THC                            50 Performed at [facility], [address], [place], [place] [address]    Acetaminophen (Tylenol), Serum 11/13/2020 <10 (L)  10 - 30 ug/mL Final   Comment: (NOTE) Therapeutic concentrations vary significantly. A range of 10-30 ug/mL  may be an effective concentration for many patients. However, some  are best treated at concentrations outside of this range. Acetaminophen concentrations >150 ug/mL at 4 hours after ingestion  and >50 ug/mL at 12 hours after ingestion are often associated with  toxic reactions.  Performed at [facility], [address], [place], [place] [address]    Salicylate Lvl 11/13/2020 <7.0 (L)  7.0 -  30.0 mg/dL Final   Performed at [facility], [address] [place], [place] [address]   SARS Coronavirus 2 by RT PCR 11/13/2020 NEGATIVE  NEGATIVE Final   Comment: (NOTE) SARS-CoV-2 target nucleic acids are NOT DETECTED.  The SARS-CoV-2 RNA is generally detectable in upper respiratory specimens during the acute phase of infection. The lowest concentration of SARS-CoV-2 viral copies this assay can detect is 138 copies/mL. A negative result does not preclude SARS-Cov-2 infection and should not be used as the sole basis for treatment or other patient management decisions. A negative result may occur with  improper specimen collection/handling, submission of specimen other than nasopharyngeal swab, presence of viral mutation(s) within the areas targeted by this assay, and inadequate number of viral copies(<138 copies/mL). A negative result must be combined with clinical observations, patient history, and epidemiological information. The expected result is Negative.  Fact Sheet for Patients:  BloggerCourse.com  Fact Sheet for Healthcare Providers:  SeriousBroker.it  This test is no                          t yet approved or cleared by the [place] FDA and  has been authorized for detection and/or diagnosis of SARS-CoV-2 by FDA under an Emergency Use Authorization (EUA). This EUA will remain  in effect (meaning this test can be used) for the duration of the COVID-19 declaration under Section 564(b)(1) of the Act, 21 U.S.C.section 360bbb-3(b)(1), unless the authorization is terminated  or revoked sooner.       Influenza A by PCR 11/13/2020 NEGATIVE  NEGATIVE Final   Influenza B by PCR 11/13/2020 NEGATIVE  NEGATIVE Final   Comment: (NOTE) The Xpert Xpress SARS-CoV-2/FLU/RSV plus assay is intended as an aid in the diagnosis of influenza from Nasopharyngeal swab specimens and should not be used as a sole basis for  treatment. Nasal washings and aspirates are unacceptable for Xpert Xpress SARS-CoV-2/FLU/RSV testing.  Fact Sheet for Patients: BloggerCourse.com  Fact Sheet for Healthcare Providers: SeriousBroker.it  This test is not yet approved or cleared by the [place] FDA and has been authorized for detection and/or diagnosis of SARS-CoV-2 by FDA under an Emergency Use Authorization (EUA). This EUA will remain in effect (meaning this test can be used) for the duration of the COVID-19 declaration under Section 564(b)(1) of the Act, 21 U.S.C. section 360bbb-3(b)(1), unless the authorization is terminated or revoked.  Performed at [facility], [address], [place], [place] [address]   Admission on 10/12/2020, Discharged  on 10/16/2020  Component Date Value Ref Range Status   Valproic Acid Lvl 10/12/2020 29 (L)  50.0 - 100.0 ug/mL Final   Performed at [facility], [address] [place], [place] [address]  Admission on 10/11/2020, Discharged on 10/12/2020  Component Date Value Ref Range Status   SARS Coronavirus 2 by RT PCR 10/11/2020 NEGATIVE  NEGATIVE Final   Comment: (NOTE) SARS-CoV-2 target nucleic acids are NOT DETECTED.  The SARS-CoV-2 RNA is generally detectable in upper respiratory specimens during the acute phase of infection. The lowest concentration of SARS-CoV-2 viral copies this assay can detect is 138 copies/mL. A negative result does not preclude SARS-Cov-2 infection and should not be used as the sole basis for treatment or other patient management decisions. A negative result may occur with  improper specimen collection/handling, submission of specimen other than nasopharyngeal swab, presence of viral mutation(s) within the areas targeted by this assay, and inadequate number of viral copies(<138 copies/mL). A negative result must be combined with clinical observations, patient history, and  epidemiological information. The expected result is Negative.  Fact Sheet for Patients:  BloggerCourse.com  Fact Sheet for Healthcare Providers:  SeriousBroker.it  This test is no                          t yet approved or cleared by the [place] FDA and  has been authorized for detection and/or diagnosis of SARS-CoV-2 by FDA under an Emergency Use Authorization (EUA). This EUA will remain  in effect (meaning this test can be used) for the duration of the COVID-19 declaration under Section 564(b)(1) of the Act, 21 U.S.C.section 360bbb-3(b)(1), unless the authorization is terminated  or revoked sooner.       Influenza A by PCR 10/11/2020 NEGATIVE  NEGATIVE Final   Influenza B by PCR 10/11/2020 NEGATIVE  NEGATIVE Final   Comment: (NOTE) The Xpert Xpress SARS-CoV-2/FLU/RSV plus assay is intended as an aid in the diagnosis of influenza from Nasopharyngeal swab specimens and should not be used as a sole basis for treatment. Nasal washings and aspirates are unacceptable for Xpert Xpress SARS-CoV-2/FLU/RSV testing.  Fact Sheet for Patients: BloggerCourse.com  Fact Sheet for Healthcare Providers: SeriousBroker.it  This test is not yet approved or cleared by the [place] FDA and has been authorized for detection and/or diagnosis of SARS-CoV-2 by FDA under an Emergency Use Authorization (EUA). This EUA will remain in effect (meaning this test can be used) for the duration of the COVID-19 declaration under Section 564(b)(1) of the Act, 21 U.S.C. section 360bbb-3(b)(1), unless the authorization is terminated or revoked.  Performed at [facility], [address] [place], [place] [address]    Sodium 10/11/2020 132 (L)  135 - 145 mmol/L Final   Potassium 10/11/2020 3.5  3.5 - 5.1 mmol/L Final   Chloride 10/11/2020 99  98 - 111 mmol/L Final   CO2 10/11/2020 23  22 - 32  mmol/L Final   Glucose, Bld 10/11/2020 134 (H)  70 - 99 mg/dL Final   Glucose reference range applies only to samples taken after fasting for at least 8 hours.   BUN 10/11/2020 11  6 - 20 mg/dL Final   Creatinine, Ser 10/11/2020 0.78  0.61 - 1.24 mg/dL Final   Calcium 09/81/1914 9.0  8.9 - 10.3 mg/dL Final   Total Protein 78/29/5621 6.9  6.5 - 8.1 g/dL Final   Albumin 30/86/5784 4.2  3.5 - 5.0 g/dL Final  AST 10/11/2020 24  15 - 41 U/L Final   ALT 10/11/2020 21  0 - 44 U/L Final   Alkaline Phosphatase 10/11/2020 77  38 - 126 U/L Final   Total Bilirubin 10/11/2020 1.1  0.3 - 1.2 mg/dL Final   GFR, Estimated 10/11/2020 >60  >60 mL/min Final   Comment: (NOTE) Calculated using the CKD-EPI Creatinine Equation (2021)    Anion gap 10/11/2020 10  5 - 15 Final   Performed at [facility], [address] [place], [place] [address]   Alcohol, Ethyl (B) 10/11/2020 <10  <10 mg/dL Final   Comment: (NOTE) Lowest detectable limit for serum alcohol is 10 mg/dL.  For medical purposes only. Performed at [facility], [address], [place], [place] [address]    Opiates 10/11/2020 NONE DETECTED  NONE DETECTED Final   Cocaine 10/11/2020 NONE DETECTED  NONE DETECTED Final   Benzodiazepines 10/11/2020 NONE DETECTED  NONE DETECTED Final   Amphetamines 10/11/2020 NONE DETECTED  NONE DETECTED Final   Tetrahydrocannabinol 10/11/2020 NONE DETECTED  NONE DETECTED Final   Barbiturates 10/11/2020 NONE DETECTED  NONE DETECTED Final   Comment: (NOTE) DRUG SCREEN FOR MEDICAL PURPOSES ONLY.  IF CONFIRMATION IS NEEDED FOR ANY PURPOSE, NOTIFY LAB WITHIN 5 DAYS.  LOWEST DETECTABLE LIMITS FOR URINE DRUG SCREEN Drug Class                     Cutoff (ng/mL) Amphetamine and metabolites    1000 Barbiturate and metabolites    200 Benzodiazepine                 200 Tricyclics and metabolites     300 Opiates and metabolites        300 Cocaine and metabolites        300 THC                             50 Performed at [facility], [address] [place], [place] [address]    WBC 10/11/2020 10.3  4.0 - 10.5 K/uL Final   RBC 10/11/2020 4.48  4.22 - 5.81 MIL/uL Final   Hemoglobin 10/11/2020 13.7  13.0 - 17.0 g/dL Final   HCT 60/63/0160 39.6  39.0 - 52.0 % Final   MCV 10/11/2020 88.4  80.0 - 100.0 fL Final   MCH 10/11/2020 30.6  26.0 - 34.0 pg Final   MCHC 10/11/2020 34.6  30.0 - 36.0 g/dL Final   RDW 10/93/2355 12.6  11.5 - 15.5 % Final   Platelets 10/11/2020 362  150 - 400 K/uL Final   nRBC 10/11/2020 0.0  0.0 - 0.2 % Final   Neutrophils Relative % 10/11/2020 73  % Final   Neutro Abs 10/11/2020 7.4  1.7 - 7.7 K/uL Final   Lymphocytes Relative 10/11/2020 15  % Final   Lymphs Abs 10/11/2020 1.5  0.7 - 4.0 K/uL Final   Monocytes Relative 10/11/2020 10  % Final   Monocytes Absolute 10/11/2020 1.1 (H)  0.1 - 1.0 K/uL Final   Eosinophils Relative 10/11/2020 2  % Final   Eosinophils Absolute 10/11/2020 0.2  0.0 - 0.5 K/uL Final   Basophils Relative 10/11/2020 0  % Final   Basophils Absolute 10/11/2020 0.0  0.0 - 0.1 K/uL Final   Immature Granulocytes 10/11/2020 0  % Final   Abs Immature Granulocytes 10/11/2020 0.04  0.00 - 0.07 K/uL Final   Performed at [facility], [address], [place], [place] [address]   Salicylate Lvl 10/11/2020 <7.0 (L)  7.0 - 30.0 mg/dL Final   Performed at [facility], [address], [place], [place] [address]   Acetaminophen (Tylenol), Serum 10/11/2020 <10 (L)  10 - 30 ug/mL Final   Comment: (NOTE) Therapeutic concentrations vary significantly. A range of 10-30 ug/mL  may be an effective concentration for many patients. However, some  are best treated at concentrations outside of this range. Acetaminophen concentrations >150 ug/mL at 4 hours after ingestion  and >50 ug/mL at 12 hours after ingestion are often associated with  toxic reactions.  Performed at [facility], [address] [place], [place] [address]   Admission on  10/06/2020, Discharged on 10/07/2020  Component Date Value Ref Range Status   WBC 10/06/2020 7.1  4.0 - 10.5 K/uL Final   RBC 10/06/2020 4.45  4.22 - 5.81 MIL/uL Final   Hemoglobin 10/06/2020 13.7  13.0 - 17.0 g/dL Final   HCT 91/47/8295 40.3  39.0 - 52.0 % Final   MCV 10/06/2020 90.6  80.0 - 100.0 fL Final   MCH 10/06/2020 30.8  26.0 - 34.0 pg Final   MCHC 10/06/2020 34.0  30.0 - 36.0 g/dL Final   RDW 62/13/0865 12.7  11.5 - 15.5 % Final   Platelets 10/06/2020 286  150 - 400 K/uL Final   nRBC 10/06/2020 0.0  0.0 - 0.2 % Final   Performed at [facility], [address], [place], [place] [address]   Salicylate Lvl 10/06/2020 <7.0 (L)  7.0 - 30.0 mg/dL Final   Performed at [facility], [address] [place], [place] [address]   Acetaminophen (Tylenol), Serum 10/06/2020 <10 (L)  10 - 30 ug/mL Final   Comment: (NOTE) Therapeutic concentrations vary significantly. A range of 10-30 ug/mL  may be an effective concentration for many patients. However, some  are best treated at concentrations outside of this range. Acetaminophen concentrations >150 ug/mL at 4 hours after ingestion  and >50 ug/mL at 12 hours after ingestion are often associated with  toxic reactions.  Performed at [facility], [address] [place], [place] [address]    Sodium 10/06/2020 137  135 - 145 mmol/L Final   Potassium 10/06/2020 3.1 (L)  3.5 - 5.1 mmol/L Final   Chloride 10/06/2020 100  98 - 111 mmol/L Final   CO2 10/06/2020 25  22 - 32 mmol/L Final   Glucose, Bld 10/06/2020 104 (H)  70 - 99 mg/dL Final   Glucose reference range applies only to samples taken after fasting for at least 8 hours.   BUN 10/06/2020 12  6 - 20 mg/dL Final   Creatinine, Ser 10/06/2020 0.62  0.61 - 1.24 mg/dL Final   Calcium 44/03/270 8.9  8.9 - 10.3 mg/dL Final   Total Protein 53/66/4403 7.5  6.5 - 8.1 g/dL Final   Albumin 47/42/5956 4.9  3.5 - 5.0 g/dL Final   AST  38/75/6433 26  15 - 41 U/L Final   ALT 10/06/2020 31  0 - 44 U/L Final   Alkaline Phosphatase 10/06/2020 60  38 - 126 U/L Final   Total Bilirubin 10/06/2020 1.3 (H)  0.3 - 1.2 mg/dL Final   GFR, Estimated 10/06/2020 >60  >60 mL/min Final   Comment: (NOTE) Calculated using the CKD-EPI Creatinine Equation (2021)    Anion gap 10/06/2020 12  5 - 15 Final   Performed at [facility], [address] [place], [place] [address]   Alcohol, Ethyl (B) 10/06/2020 <10  <10 mg/dL Final   Comment: (NOTE) Lowest detectable limit for serum alcohol is 10 mg/dL.  For medical purposes only. Performed at [facility], [address] [place], [place] [address]    SARS Coronavirus 2 by RT PCR 10/06/2020 NEGATIVE  NEGATIVE Final   Comment: (NOTE) SARS-CoV-2 target nucleic acids are NOT DETECTED.  The SARS-CoV-2 RNA is generally detectable in upper respiratory specimens during the acute phase of infection. The lowest concentration of SARS-CoV-2 viral copies this assay can detect is 138 copies/mL. A negative result does not preclude SARS-Cov-2 infection and should not be used as the sole basis for treatment or other patient management decisions. A negative result may occur with  improper specimen collection/handling, submission of specimen other than nasopharyngeal swab, presence of viral mutation(s) within the areas targeted by this assay, and inadequate number of viral copies(<138 copies/mL). A negative result must be combined with clinical observations, patient history, and epidemiological information. The expected result is Negative.  Fact Sheet for Patients:  BloggerCourse.comhttps://www.fda.gov/media/152166/download  Fact Sheet for Healthcare Providers:  SeriousBroker.ithttps://www.fda.gov/media/152162/download  This test is no                          t yet approved or cleared by the [place] FDA and  has been authorized for detection and/or diagnosis of SARS-CoV-2 by FDA under  an Emergency Use Authorization (EUA). This EUA will remain  in effect (meaning this test can be used) for the duration of the COVID-19 declaration under Section 564(b)(1) of the Act, 21 U.S.C.section 360bbb-3(b)(1), unless the authorization is terminated  or revoked sooner.       Influenza A by PCR 10/06/2020 NEGATIVE  NEGATIVE Final   Influenza B by PCR 10/06/2020 NEGATIVE  NEGATIVE Final   Comment: (NOTE) The Xpert Xpress SARS-CoV-2/FLU/RSV plus assay is intended as an aid in the diagnosis of influenza from Nasopharyngeal swab specimens and should not be used as a sole basis for treatment. Nasal washings and aspirates are unacceptable for Xpert Xpress SARS-CoV-2/FLU/RSV testing.  Fact Sheet for Patients: BloggerCourse.comhttps://www.fda.gov/media/152166/download  Fact Sheet for Healthcare Providers: SeriousBroker.ithttps://www.fda.gov/media/152162/download  This test is not yet approved or cleared by the [place] FDA and has been authorized for detection and/or diagnosis of SARS-CoV-2 by FDA under an Emergency Use Authorization (EUA). This EUA will remain in effect (meaning this test can be used) for the duration of the COVID-19 declaration under Section 564(b)(1) of the Act, 21 U.S.C. section 360bbb-3(b)(1), unless the authorization is terminated or revoked.  Performed at [facility], [address] [place], [place] [address]    Opiates 10/07/2020 NONE DETECTED  NONE DETECTED Final   Cocaine 10/07/2020 NONE DETECTED  NONE DETECTED Final   Benzodiazepines 10/07/2020 NONE DETECTED  NONE DETECTED Final   Amphetamines 10/07/2020 POSITIVE (A)  NONE DETECTED Final   Tetrahydrocannabinol 10/07/2020 NONE DETECTED  NONE DETECTED Final   Barbiturates 10/07/2020 NONE DETECTED  NONE DETECTED Final   Comment: (NOTE) DRUG SCREEN FOR MEDICAL PURPOSES ONLY.  IF CONFIRMATION IS NEEDED FOR ANY PURPOSE, NOTIFY LAB WITHIN 5 DAYS.  LOWEST DETECTABLE LIMITS FOR URINE DRUG SCREEN Drug Class                      Cutoff (ng/mL) Amphetamine and metabolites    1000 Barbiturate and metabolites    200 Benzodiazepine  200 Tricyclics and metabolites     300 Opiates and metabolites        300 Cocaine and metabolites        300 THC                            50 Performed at [facility], [address] [place], [place] [address]     Blood Alcohol level:  Lab Results  Component Value Date   ETH 42 (H) 04/04/2021   ETH 325 (HH) 01/12/2021    Metabolic Disorder Labs: Lab Results  Component Value Date   HGBA1C 5.2 09/27/2020   MPG 103 09/27/2020   MPG 102.54 01/24/2020   No results found for: PROLACTIN Lab Results  Component Value Date   CHOL 198 01/12/2021   TRIG 281 (H) 01/12/2021   HDL 76 01/12/2021   CHOLHDL 2.6 01/12/2021   VLDL 56 (H) 01/12/2021   LDLCALC 66 01/12/2021   LDLCALC 78 09/27/2020    Therapeutic Lab Levels: Lab Results  Component Value Date   LITHIUM 0.39 (L) 09/23/2020   LITHIUM 0.38 (L) 01/27/2020   Lab Results  Component Value Date   VALPROATE 29 (L) 10/12/2020   VALPROATE 66 10/01/2020   No components found for:  CBMZ  Physical Findings   AIMS    Flowsheet Row [facility]  AIMS Total Score 0      AUDIT    Flowsheet Row Ad[facility] Admission (Discharged[facility]  Alcohol Use Disorder Identification Test Final Score (AUDIT) 0 8      Flowsheet Row [facility]  C-SSRS RISK CATEGORY High Risk High Risk High Risk        Musculoskeletal  Strength & Muscle Tone: within normal limits Gait & Station: normal Patient  leans: N/A  Psychiatric Specialty Exam  Presentation  General Appearance: Appropriate for Environment; Casual  Eye Contact:Fair  Speech:Clear and Coherent; Normal Rate  Speech Volume:Normal  Handedness:Right   Mood and Affect  Mood:Depressed; Hopeless  Affect:Congruent; Depressed   Thought Process  Thought Processes:Coherent; Goal Directed; Linear  Descriptions of Associations:Intact  Orientation:Full (Time, Place and Person)  Thought Content:Logical; WDL  Diagnosis of Schizophrenia or Schizoaffective disorder in past: No    Hallucinations:Hallucinations: None  Ideas of Reference:None  Suicidal Thoughts:Suicidal Thoughts: Yes, Passive SI Active Intent and/or Plan: Without Intent; With Plan  Homicidal Thoughts:Homicidal Thoughts: No HI Passive Intent and/or Plan: Without Intent; Without Plan   Sensorium  Memory:Immediate Good; Recent Good  Judgment:Fair  Insight:Present   Executive Functions  Concentration:Good  Attention Span:Good  Recall:Good  Fund of Knowledge:Good  Language:Good   Psychomotor Activity  Psychomotor Activity:Psychomotor Activity: Normal   Assets  Assets:Communication Skills; Desire for Improvement; Physical Health; Resilience; Social Support   Sleep  Sleep:Sleep: Fair   Nutritional Assessment (For OBS and [facility] admissions only) Has the patient had a weight loss or gain of 10 pounds or more in the last 3 months?: No Has the patient had a decrease in food intake/or appetite?: No Does the patient have dental problems?: No Does the patient have eating habits or behaviors that may be indicators of an eating disorder including  binging or inducing vomiting?: No Has the patient recently lost weight without trying?: 0 Has the patient been eating poorly because of a decreased appetite?: 0 Malnutrition Screening Tool Score: 0    Physical Exam  Physical Exam Vitals and nursing note reviewed.  Constitutional:      Appearance:  Normal appearance. He is well-developed and normal weight.  HENT:     Head: Normocephalic and atraumatic.     Nose: Nose normal.  Cardiovascular:     Rate and Rhythm: Normal rate.  Pulmonary:     Effort: Pulmonary effort is normal.  Musculoskeletal:        General: Normal range of motion.     Cervical back: Normal range of motion.  Skin:    General: Skin is warm and dry.  Neurological:     Mental Status: He is alert and oriented to person, place, and time.  Psychiatric:        Attention and Perception: Attention and perception normal.        Mood and Affect: Affect normal. Mood is depressed.        Speech: Speech normal.        Behavior: Behavior normal. Behavior is cooperative.        Thought Content: Thought content includes suicidal ideation.        Cognition and Memory: Cognition and memory normal.   Review of Systems  Constitutional: Negative.   HENT: Negative.    Eyes: Negative.   Respiratory: Negative.    Cardiovascular: Negative.   Gastrointestinal: Negative.   Genitourinary: Negative.   Musculoskeletal: Negative.   Skin: Negative.   Neurological: Negative.   Endo/Heme/Allergies: Negative.   Psychiatric/Behavioral:  Positive for depression, substance abuse and suicidal ideas.   Blood pressure 131/74, pulse 88, temperature 97.9 F (36.6 C), temperature source Oral, resp. rate 14, SpO2 100 %. There is no height or weight on file to calculate BMI.  Treatment Plan Summary: Daily contact with patient to assess and evaluate symptoms and progress in treatment Patient reviewed with Dr. Bronwen Betters. He will be admitted to facility based crisis unit for treatment and stabilization.  Remains voluntary at this time.   Lenard Lance, FNP 04/05/2021 8:51 AM

## 2021-04-05 NOTE — ED Notes (Signed)
Pt notified of lunch.  Pt remained in bed during lunch period. Breathing is even and unlabored.  Will continue to monitor for safety.

## 2021-04-05 NOTE — Progress Notes (Signed)
Remains asleep in bed.  Encouraged to come and eat lunch however patient refused.  He is experiencing mild etoh withdrawal.  Encouraged him to increase PO fluids.  Will monitor and provide safe supportive environment for him.

## 2021-04-05 NOTE — ED Notes (Signed)
Pt is currently sleeping, no distress noted, environmental check complete, will continue to monitor patient for safety. ? ?

## 2021-04-06 ENCOUNTER — Encounter (HOSPITAL_COMMUNITY): Payer: Self-pay | Admitting: Family

## 2021-04-06 DIAGNOSIS — F1910 Other psychoactive substance abuse, uncomplicated: Secondary | ICD-10-CM | POA: Diagnosis present

## 2021-04-06 DIAGNOSIS — R45851 Suicidal ideations: Secondary | ICD-10-CM | POA: Diagnosis not present

## 2021-04-06 DIAGNOSIS — Z79899 Other long term (current) drug therapy: Secondary | ICD-10-CM | POA: Diagnosis not present

## 2021-04-06 DIAGNOSIS — F319 Bipolar disorder, unspecified: Secondary | ICD-10-CM | POA: Diagnosis not present

## 2021-04-06 DIAGNOSIS — F1020 Alcohol dependence, uncomplicated: Secondary | ICD-10-CM | POA: Diagnosis not present

## 2021-04-06 MED ORDER — NICOTINE 21 MG/24HR TD PT24
MEDICATED_PATCH | TRANSDERMAL | Status: AC
  Administered 2021-04-06: 21 mg
  Filled 2021-04-06: qty 1

## 2021-04-06 NOTE — ED Notes (Signed)
Pt is in the bed sleeping. Respirations are even and unlabored. No acute distress noted. Will continue to monitor for safety. 

## 2021-04-06 NOTE — Progress Notes (Addendum)
Brett House took a shower earlier, ate lunch and returned to his room , napping.

## 2021-04-06 NOTE — Progress Notes (Signed)
Received Claudy this AM asleep in his bed, he woke up on his own, received breakfasr and returned to bed. He was awaken for medications and was compliant. He continues to endorsed felling anxious and depressed. He denied feeling suicidal at the present time. His affect is flat and avoidant.

## 2021-04-06 NOTE — ED Notes (Signed)
Pt is currently sleeping, no distress noted, environmental check complete, will continue to monitor patient for safety. ? ?

## 2021-04-06 NOTE — ED Notes (Signed)
Pt is in the bed sleeping at present. Respirations are even and unlabored. No acute distress noted. Will continue to monitor for safety. °

## 2021-04-06 NOTE — Progress Notes (Signed)
Byrant is OOB in the milieu watching TV in the day room with the MHT nearby.

## 2021-04-06 NOTE — ED Notes (Signed)
Patient denies [place]. Patient is cooperative and interacts well with staff.  Respiratory is even and unlabored. No distress noted. Patient resting in bed at present.  will continue to monitor for safety.

## 2021-04-06 NOTE — ED Notes (Signed)
Asked pt to join AA tonight he refused.

## 2021-04-06 NOTE — ED Provider Notes (Signed)
Behavioral Health Progress Note  Date and Time: 04/06/2021 12:55 PM Name: Constance HolsterBryan Colvin MRN:  [id number]  Subjective:  "[facility] to go to [facility].  The last time I was there I had a court day and they just faxed a piece of paper saying that I was there."  Constance HolsterBryan Mastandrea, 36 y.o., male patient seen face to face by this provider, consulted with Dr. Earlene PlaterKatherine Laubach; and chart reviewed on 04/06/21.  On evaluation Constance HolsterBryan Prohaska reports he continues to have passive suicidal thoughts.  Patient reported he would like to go to [facility] for rehab services.  Patient stating he was at [facility] once before and had a court date but they just sent in a formulating and when he was there.  When asked if he was having suicidal thoughts patient responded "Lil bit.  Reports he would be able to contract for safety if accepted into a rehab program.  Patient denies homicidal ideations, psychosis, and paranoia.  Patient reporting tolerating medications without any adverse reaction.  Also states he slept well last night.  During evaluation Constance HolsterBryan Charles is sitting up in bed in no acute distress.  He is alert, oriented x 4, calm, cooperative and attentive.  His mood is depressed with congruent affect.  He has normal speech, and behavior.  Objectively there is no evidence of psychosis/mania or delusional thinking.  Patient is able to converse coherently, goal directed thoughts, no distractibility, or pre-occupation.  He also denies psychosis, and paranoia.; but continue to endorse passive suicidal ideation stating he could contract for safety if accepted to a rehab program  Patient answered question appropriately.     Diagnosis:  Final diagnoses:  Bipolar I disorder, most recent episode depressed (HCC)  Alcohol use disorder, severe, dependence (HCC)    Total Time spent with patient: 20 minutes  Past Psychiatric History: Se above Past Medical History:  Past Medical History:  Diagnosis Date   Anxiety    Asthma    Bipolar disorder  (HCC)    Depression    History reviewed. No pertinent surgical history. Family History: History reviewed. No pertinent family history. Family Psychiatric  History: None noted Social History:  Social History   Substance and Sexual Activity  Alcohol Use Yes   Comment: 6 pack once a week for last 8-9 months     Social History   Substance and Sexual Activity  Drug Use Yes   Types: Marijuana   Comment: rarely    Social History   Socioeconomic History   Marital status: Single    Spouse name: Not on file   Number of children: 0   Years of education: Not on file   Highest education level: 12th grade  Occupational History   Occupation: Unemployed  Tobacco Use   Smoking status: Every Day    Packs/day: 1.50    Years: 15.00    Pack years: 22.50    Types: Cigarettes   Smokeless tobacco: Current  Vaping Use   Vaping Use: Never used  Substance and Sexual Activity   Alcohol use: Yes    Comment: 6 pack once a week for last 8-9 months   Drug use: Yes    Types: Marijuana    Comment: rarely   Sexual activity: Yes  Other Topics Concern   Not on file  Social History Narrative   Not on file   Social Determinants of Health   Financial Resource Strain: Not on file  Food Insecurity: Not on file  Transportation Needs: Not on file  Physical Activity: Not on file  Stress: Not on file  Social Connections: Not on file   SDOH:  SDOH Screenings   Alcohol Screen: Low Risk    Last Alcohol Screening Score (AUDIT): 0  Depression (PHQ2-9): Not on file  Financial Resource Strain: Not on file  Food Insecurity: Not on file  Housing: Not on file  Physical Activity: Not on file  Social Connections: Not on file  Stress: Not on file  Tobacco Use: High Risk   Smoking Tobacco Use: Every Day   Smokeless Tobacco Use: Current   Passive Exposure: Not on file  Transportation Needs: Not on file   Additional Social History:                         Sleep: Good  Appetite:   Good  Current Medications:  Current Facility-Administered Medications  Medication Dose Route Frequency Provider Last Rate Last Admin   acetaminophen (TYLENOL) tablet 650 mg  650 mg Oral Q6H PRN [name] A, NP       albuterol (VENTOLIN HFA) 108 (90 Base) MCG/ACT inhaler 1-2 puff  1-2 puff Inhalation Q4H PRN Estella Husk, MD       alum & mag hydroxide-simeth (MAALOX/MYLANTA) 200-200-20 MG/5ML suspension 30 mL  30 mL Oral Q4H PRN [name] A, NP       FLUoxetine (PROZAC) capsule 40 mg  40 mg Oral Daily [name] A, NP   40 mg at 04/06/21 1610   folic acid (FOLVITE) tablet 1 mg  1 mg Oral Daily [name] A, NP   1 mg at 04/06/21 9604   loperamide (IMODIUM) capsule 2-4 mg  2-4 mg Oral PRN Jackelyn Poling, NP       LORazepam (ATIVAN) tablet 1 mg  1 mg Oral Q6H PRN [name] A, NP       magnesium hydroxide (MILK OF MAGNESIA) suspension 30 mL  30 mL Oral Daily PRN Jackelyn Poling, NP       multivitamin with minerals tablet 1 tablet  1 tablet Oral Daily [name] A, NP   1 tablet at 04/06/21 0958   ondansetron (ZOFRAN-ODT) disintegrating tablet 4 mg  4 mg Oral Q6H PRN Jackelyn Poling, NP       risperiDONE (RISPERDAL) tablet 2 mg  2 mg Oral QHS Estella Husk, MD   2 mg at 04/05/21 2222   thiamine tablet 100 mg  100 mg Oral Daily [name] A, NP   100 mg at 04/06/21 5409   Current Outpatient Medications  Medication Sig Dispense Refill   albuterol (VENTOLIN HFA) 108 (90 Base) MCG/ACT inhaler Inhale 1-2 puffs into the lungs every 4 (four) hours as needed for wheezing or shortness of breath. 1 each 1   FLUoxetine (PROZAC) 40 MG capsule Take 1 capsule (40 mg total) by mouth daily. 30 capsule 1   hydrOXYzine (ATARAX/VISTARIL) 25 MG tablet Take 1 tablet (25 mg total) by mouth every 6 (six) hours as needed for anxiety. 30 tablet 1   Multiple Vitamin (MULTIVITAMIN WITH MINERALS) TABS tablet Take 1 tablet by mouth daily. (Patient not taking: Reported on 04/04/2021) 30 tablet 1    nicotine (NICODERM CQ - DOSED IN MG/24 HOURS) 21 mg/24hr patch Place 1 patch (21 mg total) onto the skin daily at 6 (six) AM. (Patient not taking: Reported on 04/04/2021) 28 patch 1   OLANZapine (ZYPREXA) 10 MG tablet Take 1 tablet (10 mg total) by mouth  at bedtime. 30 tablet 1   thiamine 100 MG tablet Take 1 tablet (100 mg total) by mouth daily. (Patient not taking: Reported on 04/04/2021) 30 tablet 1   traZODone (DESYREL) 50 MG tablet Take 1 tablet (50 mg total) by mouth at bedtime as needed for sleep. (Patient not taking: Reported on 04/04/2021) 30 tablet 1    Labs  Lab Results:  Admission on 04/04/2021, Discharged on 04/04/2021  Component Date Value Ref Range Status   Sodium 04/04/2021 140  135 - 145 mmol/L Final   Potassium 04/04/2021 3.3 (L)  3.5 - 5.1 mmol/L Final   Chloride 04/04/2021 100  98 - 111 mmol/L Final   CO2 04/04/2021 28  22 - 32 mmol/L Final   Glucose, Bld 04/04/2021 84  70 - 99 mg/dL Final   Glucose reference range applies only to samples taken after fasting for at least 8 hours.   BUN 04/04/2021 6  6 - 20 mg/dL Final   Creatinine, Ser 04/04/2021 0.82  0.61 - 1.24 mg/dL Final   Calcium 16/12/9602 9.3  8.9 - 10.3 mg/dL Final   Total Protein 54/11/8117 6.9  6.5 - 8.1 g/dL Final   Albumin 14/78/2956 4.3  3.5 - 5.0 g/dL Final   AST 21/30/8657 28  15 - 41 U/L Final   ALT 04/04/2021 20  0 - 44 U/L Final   Alkaline Phosphatase 04/04/2021 86  38 - 126 U/L Final   Total Bilirubin 04/04/2021 1.2  0.3 - 1.2 mg/dL Final   GFR, Estimated 04/04/2021 >60  >60 mL/min Final   Comment: (NOTE) Calculated using the CKD-EPI Creatinine Equation (2021)    Anion gap 04/04/2021 12  5 - 15 Final   Performed at [facility], [address] [place], [place] [address]   Alcohol, Ethyl (B) 04/04/2021 42 (H)  <10 mg/dL Final   Comment: (NOTE) Lowest detectable limit for serum alcohol is 10 mg/dL.  For medical purposes only. Performed at [facility], [address], [place],  [place] [address]    Salicylate Lvl 04/04/2021 <7.0 (L)  7.0 - 30.0 mg/dL Final   Performed at [facility], [address], [place], [place] [address]   Acetaminophen (Tylenol), Serum 04/04/2021 <10 (L)  10 - 30 ug/mL Final   Comment: (NOTE) Therapeutic concentrations vary significantly. A range of 10-30 ug/mL  may be an effective concentration for many patients. However, some  are best treated at concentrations outside of this range. Acetaminophen concentrations >150 ug/mL at 4 hours after ingestion  and >50 ug/mL at 12 hours after ingestion are often associated with  toxic reactions.  Performed at [facility], [address], [place], [place] [address]    WBC 04/04/2021 10.0  4.0 - 10.5 K/uL Final   RBC 04/04/2021 5.06  4.22 - 5.81 MIL/uL Final   Hemoglobin 04/04/2021 15.8  13.0 - 17.0 g/dL Final   HCT 72/53/6644 48.1  39.0 - 52.0 % Final   MCV 04/04/2021 95.1  80.0 - 100.0 fL Final   MCH 04/04/2021 31.2  26.0 - 34.0 pg Final   MCHC 04/04/2021 32.8  30.0 - 36.0 g/dL Final   RDW 03/47/4259 13.2  11.5 - 15.5 % Final   Platelets 04/04/2021 283  150 - 400 K/uL Final   nRBC 04/04/2021 0.0  0.0 - 0.2 % Final   Performed at [facility], [address] [place], [place] [address]   Opiates 04/04/2021 NONE DETECTED  NONE DETECTED  Final   Cocaine 04/04/2021 POSITIVE (A)  NONE DETECTED Final   Benzodiazepines 04/04/2021 NONE DETECTED  NONE DETECTED Final   Amphetamines 04/04/2021 POSITIVE (A)  NONE DETECTED Final   Tetrahydrocannabinol 04/04/2021 POSITIVE (A)  NONE DETECTED Final   Barbiturates 04/04/2021 NONE DETECTED  NONE DETECTED Final   Comment: (NOTE) DRUG SCREEN FOR MEDICAL PURPOSES ONLY.  IF CONFIRMATION IS NEEDED FOR ANY PURPOSE, NOTIFY LAB WITHIN 5 DAYS.  LOWEST DETECTABLE LIMITS FOR URINE DRUG SCREEN Drug Class                     Cutoff (ng/mL) Amphetamine and metabolites    1000 Barbiturate and metabolites    200 Benzodiazepine                  200 Tricyclics and metabolites     300 Opiates and metabolites        300 Cocaine and metabolites        300 THC                            50 Performed at [facility], [address] [place], [place] [address]    SARS Coronavirus 2 by RT PCR 04/04/2021 NEGATIVE  NEGATIVE Final   Comment: (NOTE) SARS-CoV-2 target nucleic acids are NOT DETECTED.  The SARS-CoV-2 RNA is generally detectable in upper respiratory specimens during the acute phase of infection. The lowest concentration of SARS-CoV-2 viral copies this assay can detect is 138 copies/mL. A negative result does not preclude SARS-Cov-2 infection and should not be used as the sole basis for treatment or other patient management decisions. A negative result may occur with  improper specimen collection/handling, submission of specimen other than nasopharyngeal swab, presence of viral mutation(s) within the areas targeted by this assay, and inadequate number of viral copies(<138 copies/mL). A negative result must be combined with clinical observations, patient history, and epidemiological information. The expected result is Negative.  Fact Sheet for Patients:  BloggerCourse.com  Fact Sheet for Healthcare Providers:  SeriousBroker.it  This test is no                          t yet approved or cleared by the [place] FDA and  has been authorized for detection and/or diagnosis of SARS-CoV-2 by FDA under an Emergency Use Authorization (EUA). This EUA will remain  in effect (meaning this test can be used) for the duration of the COVID-19 declaration under Section 564(b)(1) of the Act, 21 U.S.C.section 360bbb-3(b)(1), unless the authorization is terminated  or revoked sooner.       Influenza A by PCR 04/04/2021 NEGATIVE  NEGATIVE Final   Influenza B by PCR 04/04/2021 NEGATIVE  NEGATIVE Final   Comment: (NOTE) The Xpert Xpress SARS-CoV-2/FLU/RSV plus assay is  intended as an aid in the diagnosis of influenza from Nasopharyngeal swab specimens and should not be used as a sole basis for treatment. Nasal washings and aspirates are unacceptable for Xpert Xpress SARS-CoV-2/FLU/RSV testing.  Fact Sheet for Patients: BloggerCourse.com  Fact Sheet for Healthcare Providers: SeriousBroker.it  This test is not yet approved or cleared by the [place] FDA and has been authorized for detection and/or diagnosis of SARS-CoV-2 by FDA under an Emergency Use Authorization (EUA). This EUA will remain in effect (meaning this test can be used) for the duration of the COVID-19 declaration under Section 564(b)(1) of the Act,  21 U.S.C. section 360bbb-3(b)(1), unless the authorization is terminated or revoked.  Performed at [facility], [address], [place], [place] [address]   Admission on 01/13/2021, Discharged on 01/16/2021  Component Date Value Ref Range Status   Magnesium 01/12/2021 2.4  1.7 - 2.4 mg/dL Final   Performed at [facility], [address], [place], [place] [address]   Cholesterol 01/12/2021 198  0 - 200 mg/dL Final   Triglycerides 09/81/1914 281 (H)  <150 mg/dL Final   HDL 78/29/5621 76  >40 mg/dL Final   Total CHOL/HDL Ratio 01/12/2021 2.6  RATIO Final   VLDL 01/12/2021 56 (H)  0 - 40 mg/dL Final   LDL Cholesterol 01/12/2021 66  0 - 99 mg/dL Final   Comment:        Total Cholesterol/HDL:CHD Risk Coronary Heart Disease Risk Table                     Men   Women  1/2 Average Risk   3.4   3.3  Average Risk       5.0   4.4  2 X Average Risk   9.6   7.1  3 X Average Risk  23.4   11.0        Use the calculated Patient Ratio above and the CHD Risk Table to determine the patient's CHD Risk.        ATP III CLASSIFICATION (LDL):  <100     mg/dL   Optimal  308-657  mg/dL   Near or Above                    Optimal  130-159  mg/dL   Borderline  846-962  mg/dL    High  >952     mg/dL   Very High Performed at [facility], [address] [place], [place] [address]    TSH 01/12/2021 1.069  0.350 - 4.500 uIU/mL Final   Comment: Performed by a 3rd Generation assay with a functional sensitivity of <=0.01 uIU/mL. Performed at [facility], [address], [place], [place] [address]    Sodium 01/16/2021 136  135 - 145 mmol/L Final   Potassium 01/16/2021 4.5  3.5 - 5.1 mmol/L Final   Chloride 01/16/2021 101  98 - 111 mmol/L Final   CO2 01/16/2021 27  22 - 32 mmol/L Final   Glucose, Bld 01/16/2021 114 (H)  70 - 99 mg/dL Final   Glucose reference range applies only to samples taken after fasting for at least 8 hours.   BUN 01/16/2021 11  6 - 20 mg/dL Final   Creatinine, Ser 01/16/2021 0.73  0.61 - 1.24 mg/dL Final   Calcium 27/25/3664 9.0  8.9 - 10.3 mg/dL Final   GFR, Estimated 01/16/2021 >60  >60 mL/min Final   Comment: (NOTE) Calculated using the CKD-EPI Creatinine Equation (2021)    Anion gap 01/16/2021 8  5 - 15 Final   Performed at [facility], [address] [place], [place] [address]  Admission on 01/12/2021, Discharged on 01/13/2021  Component Date Value Ref Range Status   Sodium 01/12/2021 139  135 - 145 mmol/L Final   Potassium 01/12/2021 3.1 (L)  3.5 - 5.1 mmol/L Final   Chloride 01/12/2021 107  98 - 111 mmol/L Final   CO2 01/12/2021 23  22 - 32 mmol/L Final   Glucose, Bld 01/12/2021 107 (H)  70 - 99 mg/dL Final  Glucose reference range applies only to samples taken after fasting for at least 8 hours.   BUN 01/12/2021 6  6 - 20 mg/dL Final   Creatinine, Ser 01/12/2021 0.62  0.61 - 1.24 mg/dL Final   Calcium 16/12/9602 8.3 (L)  8.9 - 10.3 mg/dL Final   Total Protein 54/11/8117 6.5  6.5 - 8.1 g/dL Final   Albumin 14/78/2956 4.1  3.5 - 5.0 g/dL Final   AST 21/30/8657 29  15 - 41 U/L Final   ALT 01/12/2021 22  0 - 44 U/L Final   Alkaline Phosphatase 01/12/2021 65  38 - 126 U/L Final    Total Bilirubin 01/12/2021 0.6  0.3 - 1.2 mg/dL Final   GFR, Estimated 01/12/2021 >60  >60 mL/min Final   Comment: (NOTE) Calculated using the CKD-EPI Creatinine Equation (2021)    Anion gap 01/12/2021 9  5 - 15 Final   Performed at [facility], [address] [place], [place] [address]   Alcohol, Ethyl (B) 01/12/2021 325 (HH)  <10 mg/dL Final   Comment: CRITICAL RESULT CALLED TO, READ BACK BY AND VERIFIED WITH: SONYA, RN @ 1624 ON 01/12/2021 BY LBROOKS, MLT (NOTE) Lowest detectable limit for serum alcohol is 10 mg/dL.  For medical purposes only. Performed at [facility], [address] [place], [place] [address]    Salicylate Lvl 01/12/2021 <7.0 (L)  7.0 - 30.0 mg/dL Final   Performed at [facility], [address], [place], [place] [address]   Acetaminophen (Tylenol), Serum 01/12/2021 <10 (L)  10 - 30 ug/mL Final   Comment: (NOTE) Therapeutic concentrations vary significantly. A range of 10-30 ug/mL  may be an effective concentration for many patients. However, some  are best treated at concentrations outside of this range. Acetaminophen concentrations >150 ug/mL at 4 hours after ingestion  and >50 ug/mL at 12 hours after ingestion are often associated with  toxic reactions.  Performed at [facility], [address], [place], [place] [address]    WBC 01/12/2021 9.3  4.0 - 10.5 K/uL Final   RBC 01/12/2021 4.86  4.22 - 5.81 MIL/uL Final   Hemoglobin 01/12/2021 15.3  13.0 - 17.0 g/dL Final   HCT 72/53/6644 43.6  39.0 - 52.0 % Final   MCV 01/12/2021 89.7  80.0 - 100.0 fL Final   MCH 01/12/2021 31.5  26.0 - 34.0 pg Final   MCHC 01/12/2021 35.1  30.0 - 36.0 g/dL Final   RDW 03/47/4259 13.2  11.5 - 15.5 % Final   Platelets 01/12/2021 271  150 - 400 K/uL Final   nRBC 01/12/2021 0.0  0.0 - 0.2 % Final   Performed at [facility], [address], [place], [place] [address]   Opiates  01/12/2021 NONE DETECTED  NONE DETECTED Final   Cocaine 01/12/2021 NONE DETECTED  NONE DETECTED Final   Benzodiazepines 01/12/2021 NONE DETECTED  NONE DETECTED Final   Amphetamines 01/12/2021 NONE DETECTED  NONE DETECTED Final   Tetrahydrocannabinol 01/12/2021 NONE DETECTED  NONE DETECTED Final   Barbiturates 01/12/2021 NONE DETECTED  NONE DETECTED Final   Comment: (NOTE) DRUG SCREEN FOR MEDICAL PURPOSES ONLY.  IF CONFIRMATION IS NEEDED FOR ANY PURPOSE, NOTIFY LAB WITHIN 5 DAYS.  LOWEST DETECTABLE LIMITS FOR URINE DRUG SCREEN Drug Class                     Cutoff (ng/mL) Amphetamine and metabolites    1000 Barbiturate and metabolites  200 Benzodiazepine                 200 Tricyclics and metabolites     300 Opiates and metabolites        300 Cocaine and metabolites        300 THC                            50 Performed at [facility], [address], [place], [place] [address]    SARS Coronavirus 2 by RT PCR 01/12/2021 NEGATIVE  NEGATIVE Final   Comment: (NOTE) SARS-CoV-2 target nucleic acids are NOT DETECTED.  The SARS-CoV-2 RNA is generally detectable in upper respiratory specimens during the acute phase of infection. The lowest concentration of SARS-CoV-2 viral copies this assay can detect is 138 copies/mL. A negative result does not preclude SARS-Cov-2 infection and should not be used as the sole basis for treatment or other patient management decisions. A negative result may occur with  improper specimen collection/handling, submission of specimen other than nasopharyngeal swab, presence of viral mutation(s) within the areas targeted by this assay, and inadequate number of viral copies(<138 copies/mL). A negative result must be combined with clinical observations, patient history, and epidemiological information. The expected result is Negative.  Fact Sheet for Patients:  BloggerCourse.com  Fact Sheet for Healthcare  Providers:  SeriousBroker.it  This test is no                          t yet approved or cleared by the [place] FDA and  has been authorized for detection and/or diagnosis of SARS-CoV-2 by FDA under an Emergency Use Authorization (EUA). This EUA will remain  in effect (meaning this test can be used) for the duration of the COVID-19 declaration under Section 564(b)(1) of the Act, 21 U.S.C.section 360bbb-3(b)(1), unless the authorization is terminated  or revoked sooner.       Influenza A by PCR 01/12/2021 NEGATIVE  NEGATIVE Final   Influenza B by PCR 01/12/2021 NEGATIVE  NEGATIVE Final   Comment: (NOTE) The Xpert Xpress SARS-CoV-2/FLU/RSV plus assay is intended as an aid in the diagnosis of influenza from Nasopharyngeal swab specimens and should not be used as a sole basis for treatment. Nasal washings and aspirates are unacceptable for Xpert Xpress SARS-CoV-2/FLU/RSV testing.  Fact Sheet for Patients: BloggerCourse.com  Fact Sheet for Healthcare Providers: SeriousBroker.it  This test is not yet approved or cleared by the [place] FDA and has been authorized for detection and/or diagnosis of SARS-CoV-2 by FDA under an Emergency Use Authorization (EUA). This EUA will remain in effect (meaning this test can be used) for the duration of the COVID-19 declaration under Section 564(b)(1) of the Act, 21 U.S.C. section 360bbb-3(b)(1), unless the authorization is terminated or revoked.  Performed at [facility], [address], [place], [place] [address]   Admission on 11/14/2020, Discharged on 11/19/2020  Component Date Value Ref Range Status   RPR Ser Ql 11/14/2020 NON REACTIVE  NON REACTIVE Final   Performed at [facility], [address], [place], [place] [address]   Hepatitis B Surface Ag 11/14/2020 NON REACTIVE  NON REACTIVE Final   HCV Ab 11/14/2020 NON REACTIVE   NON REACTIVE Final   Comment: (NOTE) Nonreactive HCV antibody screen is consistent with no HCV infections,  unless recent infection is suspected or other evidence exists to indicate HCV infection.  Hep A IgM 11/14/2020 NON REACTIVE  NON REACTIVE Final   Hep B C IgM 11/14/2020 NON REACTIVE  NON REACTIVE Final   Performed at [facility], [address] [place], [place] [address]   HIV Screen 4th Generation wRfx 11/14/2020 Non Reactive  Non Reactive Final   Performed at [facility], [address], [place], [place] [address]  Admission on 11/13/2020, Discharged on 11/14/2020  Component Date Value Ref Range Status   Sodium 11/13/2020 136  135 - 145 mmol/L Final   Potassium 11/13/2020 3.8  3.5 - 5.1 mmol/L Final   Chloride 11/13/2020 101  98 - 111 mmol/L Final   CO2 11/13/2020 24  22 - 32 mmol/L Final   Glucose, Bld 11/13/2020 102 (H)  70 - 99 mg/dL Final   Glucose reference range applies only to samples taken after fasting for at least 8 hours.   BUN 11/13/2020 5 (L)  6 - 20 mg/dL Final   Creatinine, Ser 11/13/2020 0.69  0.61 - 1.24 mg/dL Final   Calcium 25/42/7062 9.3  8.9 - 10.3 mg/dL Final   Total Protein 37/62/8315 7.3  6.5 - 8.1 g/dL Final   Albumin 17/61/6073 4.5  3.5 - 5.0 g/dL Final   AST 71/08/2692 21  15 - 41 U/L Final   ALT 11/13/2020 14  0 - 44 U/L Final   Alkaline Phosphatase 11/13/2020 97  38 - 126 U/L Final   Total Bilirubin 11/13/2020 1.3 (H)  0.3 - 1.2 mg/dL Final   GFR, Estimated 11/13/2020 >60  >60 mL/min Final   Comment: (NOTE) Calculated using the CKD-EPI Creatinine Equation (2021)    Anion gap 11/13/2020 11  5 - 15 Final   Performed at [facility], [address], [place], [place] [address]   Alcohol, Ethyl (B) 11/13/2020 <10  <10 mg/dL Final   Comment: (NOTE) Lowest detectable limit for serum alcohol is 10 mg/dL.  For medical purposes only. Performed at [facility], [address], [place], [place] [address]    WBC  11/13/2020 7.1  4.0 - 10.5 K/uL Final   RBC 11/13/2020 5.02  4.22 - 5.81 MIL/uL Final   Hemoglobin 11/13/2020 16.0  13.0 - 17.0 g/dL Final   HCT 09/38/1829 46.2  39.0 - 52.0 % Final   MCV 11/13/2020 92.0  80.0 - 100.0 fL Final   MCH 11/13/2020 31.9  26.0 - 34.0 pg Final   MCHC 11/13/2020 34.6  30.0 - 36.0 g/dL Final   RDW 93/71/6967 13.6  11.5 - 15.5 % Final   Platelets 11/13/2020 269  150 - 400 K/uL Final   nRBC 11/13/2020 0.0  0.0 - 0.2 % Final   Performed at [facility], [address] [place], [place] [address]   Opiates 11/13/2020 NONE DETECTED  NONE DETECTED Final   Cocaine 11/13/2020 NONE DETECTED  NONE DETECTED Final   Benzodiazepines 11/13/2020 NONE DETECTED  NONE DETECTED Final   Amphetamines 11/13/2020 NONE DETECTED  NONE DETECTED Final   Tetrahydrocannabinol 11/13/2020 NONE DETECTED  NONE DETECTED Final   Barbiturates 11/13/2020 NONE DETECTED  NONE DETECTED Final   Comment: (NOTE) DRUG SCREEN FOR MEDICAL PURPOSES ONLY.  IF CONFIRMATION IS NEEDED FOR ANY PURPOSE, NOTIFY LAB WITHIN 5 DAYS.  LOWEST DETECTABLE LIMITS FOR URINE DRUG SCREEN Drug Class                     Cutoff (ng/mL) Amphetamine and metabolites    1000 Barbiturate  and metabolites    200 Benzodiazepine                 200 Tricyclics and metabolites     300 Opiates and metabolites        300 Cocaine and metabolites        300 THC                            50 Performed at [facility], [address], [place], [place] [address]    Acetaminophen (Tylenol), Serum 11/13/2020 <10 (L)  10 - 30 ug/mL Final   Comment: (NOTE) Therapeutic concentrations vary significantly. A range of 10-30 ug/mL  may be an effective concentration for many patients. However, some  are best treated at concentrations outside of this range. Acetaminophen concentrations >150 ug/mL at 4 hours after ingestion  and >50 ug/mL at 12 hours after ingestion are often associated with  toxic reactions.  Performed at [facility], [address], [place], [place] [address]    Salicylate Lvl 11/13/2020 <7.0 (L)  7.0 - 30.0 mg/dL Final   Performed at [facility], [address] [place], [place] [address]   SARS Coronavirus 2 by RT PCR 11/13/2020 NEGATIVE  NEGATIVE Final   Comment: (NOTE) SARS-CoV-2 target nucleic acids are NOT DETECTED.  The SARS-CoV-2 RNA is generally detectable in upper respiratory specimens during the acute phase of infection. The lowest concentration of SARS-CoV-2 viral copies this assay can detect is 138 copies/mL. A negative result does not preclude SARS-Cov-2 infection and should not be used as the sole basis for treatment or other patient management decisions. A negative result may occur with  improper specimen collection/handling, submission of specimen other than nasopharyngeal swab, presence of viral mutation(s) within the areas targeted by this assay, and inadequate number of viral copies(<138 copies/mL). A negative result must be combined with clinical observations, patient history, and epidemiological information. The expected result is Negative.  Fact Sheet for Patients:  BloggerCourse.com  Fact Sheet for Healthcare Providers:  SeriousBroker.it  This test is no                          t yet approved or cleared by the [place] FDA and  has been authorized for detection and/or diagnosis of SARS-CoV-2 by FDA under an Emergency Use Authorization (EUA). This EUA will remain  in effect (meaning this test can be used) for the duration of the COVID-19 declaration under Section 564(b)(1) of the Act, 21 U.S.C.section 360bbb-3(b)(1), unless the authorization is terminated  or revoked sooner.       Influenza A by PCR 11/13/2020 NEGATIVE  NEGATIVE Final   Influenza B by PCR 11/13/2020 NEGATIVE  NEGATIVE Final   Comment: (NOTE) The Xpert Xpress SARS-CoV-2/FLU/RSV plus assay is intended as an aid in the  diagnosis of influenza from Nasopharyngeal swab specimens and should not be used as a sole basis for treatment. Nasal washings and aspirates are unacceptable for Xpert Xpress SARS-CoV-2/FLU/RSV testing.  Fact Sheet for Patients: BloggerCourse.com  Fact Sheet for Healthcare Providers: SeriousBroker.it  This test is not yet approved or cleared by the [place] FDA and has been authorized for detection and/or diagnosis of SARS-CoV-2 by FDA under an Emergency Use Authorization (EUA). This EUA will remain in effect (meaning this test can be used) for the duration of the COVID-19 declaration under Section 564(b)(1) of  the Act, 21 U.S.C. section 360bbb-3(b)(1), unless the authorization is terminated or revoked.  Performed at [facility], [address], [place], [place] [address]   Admission on 10/12/2020, Discharged on 10/16/2020  Component Date Value Ref Range Status   Valproic Acid Lvl 10/12/2020 29 (L)  50.0 - 100.0 ug/mL Final   Performed at [facility], [address] [place], [place] [address]  Admission on 10/11/2020, Discharged on 10/12/2020  Component Date Value Ref Range Status   SARS Coronavirus 2 by RT PCR 10/11/2020 NEGATIVE  NEGATIVE Final   Comment: (NOTE) SARS-CoV-2 target nucleic acids are NOT DETECTED.  The SARS-CoV-2 RNA is generally detectable in upper respiratory specimens during the acute phase of infection. The lowest concentration of SARS-CoV-2 viral copies this assay can detect is 138 copies/mL. A negative result does not preclude SARS-Cov-2 infection and should not be used as the sole basis for treatment or other patient management decisions. A negative result may occur with  improper specimen collection/handling, submission of specimen other than nasopharyngeal swab, presence of viral mutation(s) within the areas targeted by this assay, and inadequate number of  viral copies(<138 copies/mL). A negative result must be combined with clinical observations, patient history, and epidemiological information. The expected result is Negative.  Fact Sheet for Patients:  BloggerCourse.com  Fact Sheet for Healthcare Providers:  SeriousBroker.it  This test is no                          t yet approved or cleared by the [place] FDA and  has been authorized for detection and/or diagnosis of SARS-CoV-2 by FDA under an Emergency Use Authorization (EUA). This EUA will remain  in effect (meaning this test can be used) for the duration of the COVID-19 declaration under Section 564(b)(1) of the Act, 21 U.S.C.section 360bbb-3(b)(1), unless the authorization is terminated  or revoked sooner.       Influenza A by PCR 10/11/2020 NEGATIVE  NEGATIVE Final   Influenza B by PCR 10/11/2020 NEGATIVE  NEGATIVE Final   Comment: (NOTE) The Xpert Xpress SARS-CoV-2/FLU/RSV plus assay is intended as an aid in the diagnosis of influenza from Nasopharyngeal swab specimens and should not be used as a sole basis for treatment. Nasal washings and aspirates are unacceptable for Xpert Xpress SARS-CoV-2/FLU/RSV testing.  Fact Sheet for Patients: BloggerCourse.com  Fact Sheet for Healthcare Providers: SeriousBroker.it  This test is not yet approved or cleared by the [place] FDA and has been authorized for detection and/or diagnosis of SARS-CoV-2 by FDA under an Emergency Use Authorization (EUA). This EUA will remain in effect (meaning this test can be used) for the duration of the COVID-19 declaration under Section 564(b)(1) of the Act, 21 U.S.C. section 360bbb-3(b)(1), unless the authorization is terminated or revoked.  Performed at [facility], [address], [place], [place] [address]    Sodium 10/11/2020 132 (L)  135 - 145 mmol/L Final   Potassium  10/11/2020 3.5  3.5 - 5.1 mmol/L Final   Chloride 10/11/2020 99  98 - 111 mmol/L Final   CO2 10/11/2020 23  22 - 32 mmol/L Final   Glucose, Bld 10/11/2020 134 (H)  70 - 99 mg/dL Final   Glucose reference range applies only to samples taken after fasting for at least 8 hours.   BUN 10/11/2020 11  6 - 20 mg/dL Final   Creatinine, Ser 10/11/2020 0.78  0.61 - 1.24 mg/dL Final  Calcium 10/11/2020 9.0  8.9 - 10.3 mg/dL Final   Total Protein 16/10/960408/01/2021 6.9  6.5 - 8.1 g/dL Final   Albumin 54/09/811908/01/2021 4.2  3.5 - 5.0 g/dL Final   AST 14/78/295608/01/2021 24  15 - 41 U/L Final   ALT 10/11/2020 21  0 - 44 U/L Final   Alkaline Phosphatase 10/11/2020 77  38 - 126 U/L Final   Total Bilirubin 10/11/2020 1.1  0.3 - 1.2 mg/dL Final   GFR, Estimated 10/11/2020 >60  >60 mL/min Final   Comment: (NOTE) Calculated using the CKD-EPI Creatinine Equation (2021)    Anion gap 10/11/2020 10  5 - 15 Final   Performed at [facility], [address] [place], [place] [address]   Alcohol, Ethyl (B) 10/11/2020 <10  <10 mg/dL Final   Comment: (NOTE) Lowest detectable limit for serum alcohol is 10 mg/dL.  For medical purposes only. Performed at [facility], [address] [place], [place] [address]    Opiates 10/11/2020 NONE DETECTED  NONE DETECTED Final   Cocaine 10/11/2020 NONE DETECTED  NONE DETECTED Final   Benzodiazepines 10/11/2020 NONE DETECTED  NONE DETECTED Final   Amphetamines 10/11/2020 NONE DETECTED  NONE DETECTED Final   Tetrahydrocannabinol 10/11/2020 NONE DETECTED  NONE DETECTED Final   Barbiturates 10/11/2020 NONE DETECTED  NONE DETECTED Final   Comment: (NOTE) DRUG SCREEN FOR MEDICAL PURPOSES ONLY.  IF CONFIRMATION IS NEEDED FOR ANY PURPOSE, NOTIFY LAB WITHIN 5 DAYS.  LOWEST DETECTABLE LIMITS FOR URINE DRUG SCREEN Drug Class                     Cutoff (ng/mL) Amphetamine and metabolites    1000 Barbiturate and metabolites    200 Benzodiazepine                 200 Tricyclics and  metabolites     300 Opiates and metabolites        300 Cocaine and metabolites        300 THC                            50 Performed at [facility], [address] [place], [place] [address]    WBC 10/11/2020 10.3  4.0 - 10.5 K/uL Final   RBC 10/11/2020 4.48  4.22 - 5.81 MIL/uL Final   Hemoglobin 10/11/2020 13.7  13.0 - 17.0 g/dL Final   HCT 52/84/132408/01/2021 39.6  39.0 - 52.0 % Final   MCV 10/11/2020 88.4  80.0 - 100.0 fL Final   MCH 10/11/2020 30.6  26.0 - 34.0 pg Final   MCHC 10/11/2020 34.6  30.0 - 36.0 g/dL Final   RDW 40/10/272508/01/2021 12.6  11.5 - 15.5 % Final   Platelets 10/11/2020 362  150 - 400 K/uL Final   nRBC 10/11/2020 0.0  0.0 - 0.2 % Final   Neutrophils Relative % 10/11/2020 73  % Final   Neutro Abs 10/11/2020 7.4  1.7 - 7.7 K/uL Final   Lymphocytes Relative 10/11/2020 15  % Final   Lymphs Abs 10/11/2020 1.5  0.7 - 4.0 K/uL Final   Monocytes Relative 10/11/2020 10  % Final   Monocytes Absolute 10/11/2020 1.1 (H)  0.1 - 1.0 K/uL Final   Eosinophils Relative 10/11/2020 2  % Final   Eosinophils Absolute 10/11/2020 0.2  0.0 - 0.5 K/uL Final   Basophils Relative 10/11/2020 0  % Final   Basophils Absolute 10/11/2020  0.0  0.0 - 0.1 K/uL Final   Immature Granulocytes 10/11/2020 0  % Final   Abs Immature Granulocytes 10/11/2020 0.04  0.00 - 0.07 K/uL Final   Performed at [facility], [address] [place], [place] [address]   Salicylate Lvl 10/11/2020 <7.0 (L)  7.0 - 30.0 mg/dL Final   Performed at [facility], [address], [place], [place] [address]   Acetaminophen (Tylenol), Serum 10/11/2020 <10 (L)  10 - 30 ug/mL Final   Comment: (NOTE) Therapeutic concentrations vary significantly. A range of 10-30 ug/mL  may be an effective concentration for many patients. However, some  are best treated at concentrations outside of this range. Acetaminophen concentrations >150 ug/mL at 4 hours after ingestion  and >50 ug/mL at 12 hours after ingestion are often  associated with  toxic reactions.  Performed at [facility], [address], [place], [place] [address]   Admission on 10/06/2020, Discharged on 10/07/2020  Component Date Value Ref Range Status   WBC 10/06/2020 7.1  4.0 - 10.5 K/uL Final   RBC 10/06/2020 4.45  4.22 - 5.81 MIL/uL Final   Hemoglobin 10/06/2020 13.7  13.0 - 17.0 g/dL Final   HCT 91/47/8295 40.3  39.0 - 52.0 % Final   MCV 10/06/2020 90.6  80.0 - 100.0 fL Final   MCH 10/06/2020 30.8  26.0 - 34.0 pg Final   MCHC 10/06/2020 34.0  30.0 - 36.0 g/dL Final   RDW 62/13/0865 12.7  11.5 - 15.5 % Final   Platelets 10/06/2020 286  150 - 400 K/uL Final   nRBC 10/06/2020 0.0  0.0 - 0.2 % Final   Performed at [facility], [address], [place], [place] [address]   Salicylate Lvl 10/06/2020 <7.0 (L)  7.0 - 30.0 mg/dL Final   Performed at [facility], [address] [place], [place] [address]   Acetaminophen (Tylenol), Serum 10/06/2020 <10 (L)  10 - 30 ug/mL Final   Comment: (NOTE) Therapeutic concentrations vary significantly. A range of 10-30 ug/mL  may be an effective concentration for many patients. However, some  are best treated at concentrations outside of this range. Acetaminophen concentrations >150 ug/mL at 4 hours after ingestion  and >50 ug/mL at 12 hours after ingestion are often associated with  toxic reactions.  Performed at [facility], [address] [place], [place] [address]    Sodium 10/06/2020 137  135 - 145 mmol/L Final   Potassium 10/06/2020 3.1 (L)  3.5 - 5.1 mmol/L Final   Chloride 10/06/2020 100  98 - 111 mmol/L Final   CO2 10/06/2020 25  22 - 32 mmol/L Final   Glucose, Bld 10/06/2020 104 (H)  70 - 99 mg/dL Final   Glucose reference range applies only to samples taken after fasting for at least 8 hours.   BUN 10/06/2020 12  6 - 20 mg/dL Final   Creatinine, Ser 10/06/2020 0.62  0.61 - 1.24 mg/dL Final   Calcium 44/03/270 8.9  8.9 -  10.3 mg/dL Final   Total Protein 53/66/4403 7.5  6.5 - 8.1 g/dL Final   Albumin 47/42/5956 4.9  3.5 - 5.0 g/dL Final   AST 38/75/6433 26  15 - 41 U/L Final   ALT 10/06/2020 31  0 - 44 U/L Final   Alkaline Phosphatase 10/06/2020 60  38 - 126 U/L Final   Total Bilirubin 10/06/2020 1.3 (H)  0.3 - 1.2 mg/dL Final   GFR, Estimated  10/06/2020 >60  >60 mL/min Final   Comment: (NOTE) Calculated using the CKD-EPI Creatinine Equation (2021)    Anion gap 10/06/2020 12  5 - 15 Final   Performed at [facility], [address], [place], [place] [address]   Alcohol, Ethyl (B) 10/06/2020 <10  <10 mg/dL Final   Comment: (NOTE) Lowest detectable limit for serum alcohol is 10 mg/dL.  For medical purposes only. Performed at [facility], [address] [place], [place] [address]    SARS Coronavirus 2 by RT PCR 10/06/2020 NEGATIVE  NEGATIVE Final   Comment: (NOTE) SARS-CoV-2 target nucleic acids are NOT DETECTED.  The SARS-CoV-2 RNA is generally detectable in upper respiratory specimens during the acute phase of infection. The lowest concentration of SARS-CoV-2 viral copies this assay can detect is 138 copies/mL. A negative result does not preclude SARS-Cov-2 infection and should not be used as the sole basis for treatment or other patient management decisions. A negative result may occur with  improper specimen collection/handling, submission of specimen other than nasopharyngeal swab, presence of viral mutation(s) within the areas targeted by this assay, and inadequate number of viral copies(<138 copies/mL). A negative result must be combined with clinical observations, patient history, and epidemiological information. The expected result is Negative.  Fact Sheet for Patients:  BloggerCourse.com  Fact Sheet for Healthcare Providers:  SeriousBroker.it  This test is no                          t yet  approved or cleared by the [place] FDA and  has been authorized for detection and/or diagnosis of SARS-CoV-2 by FDA under an Emergency Use Authorization (EUA). This EUA will remain  in effect (meaning this test can be used) for the duration of the COVID-19 declaration under Section 564(b)(1) of the Act, 21 U.S.C.section 360bbb-3(b)(1), unless the authorization is terminated  or revoked sooner.       Influenza A by PCR 10/06/2020 NEGATIVE  NEGATIVE Final   Influenza B by PCR 10/06/2020 NEGATIVE  NEGATIVE Final   Comment: (NOTE) The Xpert Xpress SARS-CoV-2/FLU/RSV plus assay is intended as an aid in the diagnosis of influenza from Nasopharyngeal swab specimens and should not be used as a sole basis for treatment. Nasal washings and aspirates are unacceptable for Xpert Xpress SARS-CoV-2/FLU/RSV testing.  Fact Sheet for Patients: BloggerCourse.com  Fact Sheet for Healthcare Providers: SeriousBroker.it  This test is not yet approved or cleared by the [place] FDA and has been authorized for detection and/or diagnosis of SARS-CoV-2 by FDA under an Emergency Use Authorization (EUA). This EUA will remain in effect (meaning this test can be used) for the duration of the COVID-19 declaration under Section 564(b)(1) of the Act, 21 U.S.C. section 360bbb-3(b)(1), unless the authorization is terminated or revoked.  Performed at [facility], [address], [place], [place] [address]    Opiates 10/07/2020 NONE DETECTED  NONE DETECTED Final   Cocaine 10/07/2020 NONE DETECTED  NONE DETECTED Final   Benzodiazepines 10/07/2020 NONE DETECTED  NONE DETECTED Final   Amphetamines 10/07/2020 POSITIVE (A)  NONE DETECTED Final   Tetrahydrocannabinol 10/07/2020 NONE DETECTED  NONE DETECTED Final   Barbiturates 10/07/2020 NONE DETECTED  NONE DETECTED Final   Comment: (NOTE) DRUG SCREEN FOR MEDICAL PURPOSES ONLY.  IF  CONFIRMATION IS NEEDED FOR ANY PURPOSE, NOTIFY LAB WITHIN 5 DAYS.  LOWEST DETECTABLE LIMITS FOR URINE DRUG SCREEN Drug Class  Cutoff (ng/mL) Amphetamine and metabolites    1000 Barbiturate and metabolites    200 Benzodiazepine                 200 Tricyclics and metabolites     300 Opiates and metabolites        300 Cocaine and metabolites        300 THC                            50 Performed at [facility], [address], [place], [place] [address]     Blood Alcohol level:  Lab Results  Component Value Date   ETH 42 (H) 04/04/2021   ETH 325 (HH) 01/12/2021    Metabolic Disorder Labs: Lab Results  Component Value Date   HGBA1C 5.2 09/27/2020   MPG 103 09/27/2020   MPG 102.54 01/24/2020   No results found for: PROLACTIN Lab Results  Component Value Date   CHOL 198 01/12/2021   TRIG 281 (H) 01/12/2021   HDL 76 01/12/2021   CHOLHDL 2.6 01/12/2021   VLDL 56 (H) 01/12/2021   LDLCALC 66 01/12/2021   LDLCALC 78 09/27/2020    Therapeutic Lab Levels: Lab Results  Component Value Date   LITHIUM 0.39 (L) 09/23/2020   LITHIUM 0.38 (L) 01/27/2020   Lab Results  Component Value Date   VALPROATE 29 (L) 10/12/2020   VALPROATE 66 10/01/2020   No components found for:  CBMZ  Physical Findings   AIMS    Flowsheet Row [facility]  AIMS Total Score 0      AUDIT    Flowsheet Row [facility] Ad[facility] ([facility]  Alcohol Use Disorder Identification Test Final Score (AUDIT) 0 8      Flowsheet Row ED from 04/05/2021 in [facility] ED from 04/04/2021 in [facility] ED from 01/13/2021 in [facility]  C-SSRS RISK CATEGORY High  Risk High Risk High Risk        Musculoskeletal  Strength & Muscle Tone: within normal limits Gait & Station: normal Patient leans: N/A  Psychiatric Specialty Exam  Presentation  General Appearance: Appropriate for Environment  Eye Contact:Good  Speech:Clear and Coherent; Normal Rate  Speech Volume:Normal  Handedness:Right   Mood and Affect  Mood:Anxious; Depressed  Affect:Congruent   Thought Process  Thought Processes:Coherent; Goal Directed  Descriptions of Associations:Intact  Orientation:Full (Time, Place and Person)  Thought Content:WDL; Logical  Diagnosis of Schizophrenia or Schizoaffective disorder in past: No    Hallucinations:Hallucinations: None  Ideas of Reference:None  Suicidal Thoughts:Suicidal Thoughts: Yes, Passive SI Active Intent and/or Plan: Without Intent; Without Plan SI Passive Intent and/or Plan: With Intent  Homicidal Thoughts:Homicidal Thoughts: No HI Passive Intent and/or Plan: Without Intent; Without Plan   Sensorium  Memory:Immediate Good; Recent Good  Judgment:Fair  Insight:Present   Executive Functions  Concentration:Good  Attention Span:Good  Recall:Good  Fund of Knowledge:Good  Language:Good   Psychomotor Activity  Psychomotor Activity:Psychomotor Activity: Normal   Assets  Assets:Communication Skills; Desire for Improvement; Housing   Sleep  Sleep:Sleep: Good   Nutritional Assessment (For OBS and [facility] admissions only) Has the patient had a weight loss or gain of 10 pounds or more in the last 3 months?: No Has the  patient had a decrease in food intake/or appetite?: No Does the patient have dental problems?: No Does the patient have eating habits or behaviors that may be indicators of an eating disorder including binging or inducing vomiting?: No Has the patient recently lost weight without trying?: 0 Has the patient been eating poorly because of a decreased appetite?: 0 Malnutrition Screening Tool  Score: 0    Physical Exam  Physical Exam Vitals and nursing note reviewed.  Constitutional:      Appearance: Normal appearance. He is well-developed and normal weight.  Cardiovascular:     Rate and Rhythm: Normal rate.  Pulmonary:     Effort: Pulmonary effort is normal.  Musculoskeletal:        General: Normal range of motion.     Cervical back: Normal range of motion.  Skin:    General: Skin is warm and dry.  Neurological:     Mental Status: He is alert and oriented to person, place, and time.  Psychiatric:        Attention and Perception: Attention and perception normal.        Mood and Affect: Affect normal. Mood is depressed.        Speech: Speech normal.        Behavior: Behavior normal. Behavior is cooperative.        Thought Content: Thought content includes suicidal ideation.        Cognition and Memory: Cognition and memory normal.   Review of Systems  Constitutional: Negative.   HENT: Negative.    Eyes: Negative.   Respiratory: Negative.    Cardiovascular: Negative.   Gastrointestinal: Negative.   Genitourinary: Negative.   Musculoskeletal: Negative.   Skin: Negative.   Neurological: Negative.   Endo/Heme/Allergies: Negative.   Psychiatric/Behavioral:  Positive for depression, substance abuse and suicidal ideas.   Blood pressure 118/77, pulse 87, temperature (!) 97.5 F (36.4 C), temperature source Temporal, resp. rate 18, SpO2 100 %. There is no height or weight on file to calculate BMI.  Treatment Plan Summary: Daily contact with patient to assess and evaluate symptoms and progress in treatment and Medication management Social work to continue with patient for rehab program  Assunta Found, NP 04/06/2021 12:55 PM

## 2021-04-06 NOTE — Group Note (Signed)
Group Topic: Change and Accountability  Group Date: 04/06/2021 Start Time: 1010 End Time: 1050 Facilitators: Levander Campion  Department: [facility]  Number of Participants: 3  Group Focus: anger management Treatment Modality:  Behavior Modification Therapy Interventions utilized were patient education Purpose: enhance coping skills  Name: Ronrico Dupin Date of Birth: 09/02/1985  MR: [id number]    Level of Participation: active Quality of Participation: attention seeking, attentive, and cooperative Interactions with others: gave feedback Mood/Affect: appropriate Triggers (if applicable): n/a Cognition: coherent/clear Progress: Moderate Response: n/a Plan: follow-up needed  Patients Problems:  Patient Active Problem List   Diagnosis Date Noted   Bipolar 1 disorder, depressed (HCC) 04/05/2021   Bipolar 1 disorder (HCC) 11/14/2020   Bipolar 1 disorder, depressed, severe (HCC) 10/12/2020   Suicidal thoughts    Substance induced mood disorder (HCC) 10/07/2020   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

## 2021-04-07 DIAGNOSIS — R45851 Suicidal ideations: Secondary | ICD-10-CM | POA: Diagnosis not present

## 2021-04-07 DIAGNOSIS — F1020 Alcohol dependence, uncomplicated: Secondary | ICD-10-CM | POA: Diagnosis not present

## 2021-04-07 DIAGNOSIS — Z79899 Other long term (current) drug therapy: Secondary | ICD-10-CM | POA: Diagnosis not present

## 2021-04-07 DIAGNOSIS — F319 Bipolar disorder, unspecified: Secondary | ICD-10-CM | POA: Diagnosis not present

## 2021-04-07 MED ORDER — NICOTINE 21 MG/24HR TD PT24
MEDICATED_PATCH | TRANSDERMAL | Status: AC
  Filled 2021-04-07: qty 1

## 2021-04-07 NOTE — ED Notes (Signed)
Patient did attended group due to him wanting to rest, even after encouragement from staff.

## 2021-04-07 NOTE — ED Notes (Addendum)
Group handout given °

## 2021-04-07 NOTE — ED Notes (Signed)
Patient is awake and alert on unit.  He is calm and cooperative with care.  Able to make needs known.  At present watching tv in dayroom eating breakfast.  Currently denies avh shi or plan.  Will monitor and provide support as needed.

## 2021-04-07 NOTE — Progress Notes (Signed)
Patient offered and accepted double portion of dinner.  He is eating now.

## 2021-04-07 NOTE — ED Notes (Signed)
Pt is currently sleeping, no distress noted, environmental check complete, will continue to monitor patient for safety. ? ?

## 2021-04-07 NOTE — Progress Notes (Signed)
Patient watched tv for a while and is now resting in bed in room.  No acute distress.  Denied avh shi or plan.  Will monitor.

## 2021-04-07 NOTE — ED Provider Notes (Signed)
Behavioral Health Progress Note  Date and Time: 04/07/2021 9:49 AM Name: Brett House MRN:  [id number]  Subjective: Patient presented to the [facility] stating that he wants to go to [facility] for substance abuse treatment and complains of suicidal ideations.  Patient seen and reevaluated face-to-face by this provider, chart reviewed and case discussed with Dr. Bronwen Betters. On evaluation, patient is alert and oriented x4. His thought process logical and he is goal oriented. Speech is clear and coherent. Mood is dysphoric and affect is congruent.  Today, patient is currently denying suicidal and homicidal ideations. He denies auditory or visual hallucinations. There is no objective evidence that he is currently responding to internal or external stimuli. He denies depressive and anxiety symptoms at this time and states "not so far." He denies alcohol withdrawal symptoms. BAL was 42 on arrival and UDS pos for Amphetamines, cocaine and THC. He states that he is compliant with taking his medications and denies any medication side effects at this time.  He states that he would like to go to [facility] for substance abuse treatment. He states that he had a brief conversation with the social worker on Friday. He states that he called his lawyer in regards to his upcoming court date on Tuesday and is waiting for his lawyer to get it continued on Tuesday so he can go to [facility] for treatment.  Diagnosis:  Final diagnoses:  Bipolar I disorder, most recent episode depressed (HCC)  Alcohol use disorder, severe, dependence (HCC)    Total Time spent with patient: 15 minutes  Past Psychiatric History: Hx of anxiety, bipolar, depression, substance abuse  Past Medical History:  Past Medical History:  Diagnosis Date   Anxiety    Asthma    Bipolar disorder (HCC)    Depression    History reviewed. No pertinent surgical history. Family History: History reviewed. No pertinent family  history. Family Psychiatric  History: No hx reported  Social History:  Social History   Substance and Sexual Activity  Alcohol Use Yes   Comment: 6 pack once a week for last 8-9 months     Social History   Substance and Sexual Activity  Drug Use Yes   Types: Marijuana   Comment: rarely    Social History   Socioeconomic History   Marital status: Single    Spouse name: Not on file   Number of children: 0   Years of education: Not on file   Highest education level: 12th grade  Occupational History   Occupation: Unemployed  Tobacco Use   Smoking status: Every Day    Packs/day: 1.50    Years: 15.00    Pack years: 22.50    Types: Cigarettes   Smokeless tobacco: Current  Vaping Use   Vaping Use: Never used  Substance and Sexual Activity   Alcohol use: Yes    Comment: 6 pack once a week for last 8-9 months   Drug use: Yes    Types: Marijuana    Comment: rarely   Sexual activity: Yes  Other Topics Concern   Not on file  Social History Narrative   Not on file   Social Determinants of Health   Financial Resource Strain: Not on file  Food Insecurity: Not on file  Transportation Needs: Not on file  Physical Activity: Not on file  Stress: Not on file  Social Connections: Not on file   SDOH:  SDOH Screenings   Alcohol Screen: Low Risk    Last  Alcohol Screening Score (AUDIT): 0  Depression (PHQ2-9): Not on file  Financial Resource Strain: Not on file  Food Insecurity: Not on file  Housing: Not on file  Physical Activity: Not on file  Social Connections: Not on file  Stress: Not on file  Tobacco Use: High Risk   Smoking Tobacco Use: Every Day   Smokeless Tobacco Use: Current   Passive Exposure: Not on file  Transportation Needs: Not on file   Additional Social History:   Current Medications:  Current Facility-Administered Medications  Medication Dose Route Frequency Provider Last Rate Last Admin   acetaminophen (TYLENOL) tablet 650 mg  650 mg Oral Q6H PRN  [name] A, NP       albuterol (VENTOLIN HFA) 108 (90 Base) MCG/ACT inhaler 1-2 puff  1-2 puff Inhalation Q4H PRN Estella Husk, MD       alum & mag hydroxide-simeth (MAALOX/MYLANTA) 200-200-20 MG/5ML suspension 30 mL  30 mL Oral Q4H PRN [name] A, NP       FLUoxetine (PROZAC) capsule 40 mg  40 mg Oral Daily [name] A, NP   40 mg at 04/06/21 1610   folic acid (FOLVITE) tablet 1 mg  1 mg Oral Daily [name] A, NP   1 mg at 04/06/21 9604   loperamide (IMODIUM) capsule 2-4 mg  2-4 mg Oral PRN Jackelyn Poling, NP       LORazepam (ATIVAN) tablet 1 mg  1 mg Oral Q6H PRN [name] A, NP       magnesium hydroxide (MILK OF MAGNESIA) suspension 30 mL  30 mL Oral Daily PRN Jackelyn Poling, NP       multivitamin with minerals tablet 1 tablet  1 tablet Oral Daily [name] A, NP   1 tablet at 04/06/21 0958   ondansetron (ZOFRAN-ODT) disintegrating tablet 4 mg  4 mg Oral Q6H PRN Jackelyn Poling, NP       risperiDONE (RISPERDAL) tablet 2 mg  2 mg Oral QHS Estella Husk, MD   2 mg at 04/06/21 2132   thiamine tablet 100 mg  100 mg Oral Daily [name] A, NP   100 mg at 04/06/21 5409   Current Outpatient Medications  Medication Sig Dispense Refill   albuterol (VENTOLIN HFA) 108 (90 Base) MCG/ACT inhaler Inhale 1-2 puffs into the lungs every 4 (four) hours as needed for wheezing or shortness of breath. 1 each 1   FLUoxetine (PROZAC) 40 MG capsule Take 1 capsule (40 mg total) by mouth daily. 30 capsule 1   hydrOXYzine (ATARAX/VISTARIL) 25 MG tablet Take 1 tablet (25 mg total) by mouth every 6 (six) hours as needed for anxiety. 30 tablet 1   Multiple Vitamin (MULTIVITAMIN WITH MINERALS) TABS tablet Take 1 tablet by mouth daily. (Patient not taking: Reported on 04/04/2021) 30 tablet 1   nicotine (NICODERM CQ - DOSED IN MG/24 HOURS) 21 mg/24hr patch Place 1 patch (21 mg total) onto the skin daily at 6 (six) AM. (Patient not taking: Reported on 04/04/2021) 28 patch 1   OLANZapine  (ZYPREXA) 10 MG tablet Take 1 tablet (10 mg total) by mouth at bedtime. 30 tablet 1   thiamine 100 MG tablet Take 1 tablet (100 mg total) by mouth daily. (Patient not taking: Reported on 04/04/2021) 30 tablet 1   traZODone (DESYREL) 50 MG tablet Take 1 tablet (50 mg total) by mouth at bedtime as needed for sleep. (Patient not taking: Reported on 04/04/2021) 30 tablet 1  Labs  Lab Results:  Admission on 04/04/2021, Discharged on 04/04/2021  Component Date Value Ref Range Status   Sodium 04/04/2021 140  135 - 145 mmol/L Final   Potassium 04/04/2021 3.3 (L)  3.5 - 5.1 mmol/L Final   Chloride 04/04/2021 100  98 - 111 mmol/L Final   CO2 04/04/2021 28  22 - 32 mmol/L Final   Glucose, Bld 04/04/2021 84  70 - 99 mg/dL Final   Glucose reference range applies only to samples taken after fasting for at least 8 hours.   BUN 04/04/2021 6  6 - 20 mg/dL Final   Creatinine, Ser 04/04/2021 0.82  0.61 - 1.24 mg/dL Final   Calcium 16/10/960402/04/2021 9.3  8.9 - 10.3 mg/dL Final   Total Protein 54/09/811902/04/2021 6.9  6.5 - 8.1 g/dL Final   Albumin 14/78/295602/04/2021 4.3  3.5 - 5.0 g/dL Final   AST 21/30/865702/04/2021 28  15 - 41 U/L Final   ALT 04/04/2021 20  0 - 44 U/L Final   Alkaline Phosphatase 04/04/2021 86  38 - 126 U/L Final   Total Bilirubin 04/04/2021 1.2  0.3 - 1.2 mg/dL Final   GFR, Estimated 04/04/2021 >60  >60 mL/min Final   Comment: (NOTE) Calculated using the CKD-EPI Creatinine Equation (2021)    Anion gap 04/04/2021 12  5 - 15 Final   Performed at [facility], [address] [place], [place] [address]   Alcohol, Ethyl (B) 04/04/2021 42 (H)  <10 mg/dL Final   Comment: (NOTE) Lowest detectable limit for serum alcohol is 10 mg/dL.  For medical purposes only. Performed at [facility], [address] [place], [place] [address]    Salicylate Lvl 04/04/2021 <7.0 (L)  7.0 - 30.0 mg/dL Final   Performed at [facility], [address] [place], [place] [address]   Acetaminophen (Tylenol), Serum  04/04/2021 <10 (L)  10 - 30 ug/mL Final   Comment: (NOTE) Therapeutic concentrations vary significantly. A range of 10-30 ug/mL  may be an effective concentration for many patients. However, some  are best treated at concentrations outside of this range. Acetaminophen concentrations >150 ug/mL at 4 hours after ingestion  and >50 ug/mL at 12 hours after ingestion are often associated with  toxic reactions.  Performed at [facility], [address] [place], [place] [address]    WBC 04/04/2021 10.0  4.0 - 10.5 K/uL Final   RBC 04/04/2021 5.06  4.22 - 5.81 MIL/uL Final   Hemoglobin 04/04/2021 15.8  13.0 - 17.0 g/dL Final   HCT 72/53/664402/04/2021 48.1  39.0 - 52.0 % Final   MCV 04/04/2021 95.1  80.0 - 100.0 fL Final   MCH 04/04/2021 31.2  26.0 - 34.0 pg Final   MCHC 04/04/2021 32.8  30.0 - 36.0 g/dL Final   RDW 03/47/425902/04/2021 13.2  11.5 - 15.5 % Final   Platelets 04/04/2021 283  150 - 400 K/uL Final   nRBC 04/04/2021 0.0  0.0 - 0.2 % Final   Performed at [facility], [address] [place], [place] [address]   Opiates 04/04/2021 NONE DETECTED  NONE DETECTED Final   Cocaine 04/04/2021 POSITIVE (A)  NONE DETECTED Final   Benzodiazepines 04/04/2021 NONE DETECTED  NONE DETECTED Final   Amphetamines 04/04/2021 POSITIVE (A)  NONE DETECTED Final   Tetrahydrocannabinol 04/04/2021 POSITIVE (A)  NONE DETECTED Final   Barbiturates 04/04/2021 NONE DETECTED  NONE DETECTED Final   Comment: (NOTE) DRUG SCREEN FOR MEDICAL PURPOSES ONLY.  IF CONFIRMATION IS NEEDED FOR ANY PURPOSE, NOTIFY LAB WITHIN 5 DAYS.  LOWEST DETECTABLE LIMITS FOR URINE DRUG SCREEN Drug Class                     Cutoff (ng/mL) Amphetamine and metabolites    1000 Barbiturate and metabolites    200 Benzodiazepine                 200 Tricyclics and metabolites     300 Opiates and metabolites        300 Cocaine and metabolites        300 THC                            50 Performed at [facility], [address], [place], [place] [address]    SARS Coronavirus 2 by RT PCR 04/04/2021 NEGATIVE  NEGATIVE Final   Comment: (NOTE) SARS-CoV-2 target nucleic acids are NOT DETECTED.  The SARS-CoV-2 RNA is generally detectable in upper respiratory specimens during the acute phase of infection. The lowest concentration of SARS-CoV-2 viral copies this assay can detect is 138 copies/mL. A negative result does not preclude SARS-Cov-2 infection and should not be used as the sole basis for treatment or other patient management decisions. A negative result may occur with  improper specimen collection/handling, submission of specimen other than nasopharyngeal swab, presence of viral mutation(s) within the areas targeted by this assay, and inadequate number of viral copies(<138 copies/mL). A negative result must be combined with clinical observations, patient history, and epidemiological information. The expected result is Negative.  Fact Sheet for Patients:  BloggerCourse.com  Fact Sheet for Healthcare Providers:  SeriousBroker.it  This test is no                          t yet approved or cleared by the [place] FDA and  has been authorized for detection and/or diagnosis of SARS-CoV-2 by FDA under an Emergency Use Authorization (EUA). This EUA will remain  in effect (meaning this test can be used) for the duration of the COVID-19 declaration under Section 564(b)(1) of the Act, 21 U.S.C.section 360bbb-3(b)(1), unless the authorization is terminated  or revoked sooner.       Influenza A by PCR 04/04/2021 NEGATIVE  NEGATIVE Final   Influenza B by PCR 04/04/2021 NEGATIVE  NEGATIVE Final   Comment: (NOTE) The Xpert Xpress SARS-CoV-2/FLU/RSV plus assay is intended as an aid in the diagnosis of influenza from Nasopharyngeal swab specimens and should not be used as a sole basis for treatment. Nasal washings and aspirates are unacceptable for Xpert Xpress  SARS-CoV-2/FLU/RSV testing.  Fact Sheet for Patients: BloggerCourse.com  Fact Sheet for Healthcare Providers: SeriousBroker.it  This test is not yet approved or cleared by the [place] FDA and has been authorized for detection and/or diagnosis of SARS-CoV-2 by FDA under an Emergency Use Authorization (EUA). This EUA will remain in effect (meaning this test can be used) for the duration of the COVID-19 declaration under Section 564(b)(1) of the Act, 21 U.S.C. section 360bbb-3(b)(1), unless the authorization is terminated or revoked.  Performed at [facility], [address], [place], [place] [address]   Admission on 01/13/2021, Discharged on 01/16/2021  Component Date Value Ref Range Status   Magnesium 01/12/2021 2.4  1.7 - 2.4 mg/dL Final   Performed at [facility], [address] [place], [place] [address]   Cholesterol 01/12/2021 198  0 - 200 mg/dL Final   Triglycerides 95/28/4132 281 (H)  <150 mg/dL Final   HDL 44/03/270 76  >40 mg/dL Final   Total CHOL/HDL Ratio 01/12/2021 2.6  RATIO Final   VLDL 01/12/2021 56 (H)  0 - 40 mg/dL Final   LDL Cholesterol 01/12/2021 66  0 - 99 mg/dL Final   Comment:        Total Cholesterol/HDL:CHD Risk Coronary Heart Disease Risk Table                     Men   Women  1/2 Average Risk   3.4   3.3  Average Risk       5.0   4.4  2 X Average Risk   9.6   7.1  3 X Average Risk  23.4   11.0        Use the calculated Patient Ratio above and the CHD Risk Table to determine the patient's CHD Risk.        ATP III CLASSIFICATION (LDL):  <100     mg/dL   Optimal  536-644  mg/dL   Near or Above                    Optimal  130-159  mg/dL   Borderline  034-742  mg/dL   High  >595     mg/dL   Very High Performed at [facility], [address] [place], [place] [address]    TSH 01/12/2021 1.069  0.350 - 4.500 uIU/mL Final   Comment: Performed  by a 3rd Generation assay with a functional sensitivity of <=0.01 uIU/mL. Performed at [facility], [address] [place], [place] [address]    Sodium 01/16/2021 136  135 - 145 mmol/L Final   Potassium 01/16/2021 4.5  3.5 - 5.1 mmol/L Final   Chloride 01/16/2021 101  98 - 111 mmol/L Final   CO2 01/16/2021 27  22 - 32 mmol/L Final   Glucose, Bld 01/16/2021 114 (H)  70 - 99 mg/dL Final   Glucose reference range applies only to samples taken after fasting for at least 8 hours.   BUN 01/16/2021 11  6 - 20 mg/dL Final   Creatinine, Ser 01/16/2021 0.73  0.61 - 1.24 mg/dL Final   Calcium 95/18/8416 9.0  8.9 - 10.3 mg/dL Final   GFR, Estimated 01/16/2021 >60  >60 mL/min Final   Comment: (NOTE) Calculated using the CKD-EPI Creatinine Equation (2021)    Anion gap 01/16/2021 8  5 - 15 Final   Performed at [facility], [address] [place], [place] [address]  Admission on 01/12/2021, Discharged on 01/13/2021  Component Date Value Ref Range Status   Sodium 01/12/2021 139  135 - 145 mmol/L Final   Potassium 01/12/2021 3.1 (L)  3.5 - 5.1 mmol/L Final   Chloride 01/12/2021 107  98 - 111 mmol/L Final   CO2 01/12/2021 23  22 - 32 mmol/L Final   Glucose, Bld 01/12/2021 107 (H)  70 - 99 mg/dL Final   Glucose reference range applies only to samples taken after fasting for at least 8 hours.   BUN 01/12/2021 6  6 - 20 mg/dL Final   Creatinine, Ser 01/12/2021 0.62  0.61 - 1.24 mg/dL Final   Calcium 16/03/930 8.3 (L)  8.9 - 10.3 mg/dL Final   Total Protein 35/57/3220 6.5  6.5 - 8.1 g/dL  Final   Albumin 01/12/2021 4.1  3.5 - 5.0 g/dL Final   AST 16/12/9602 29  15 - 41 U/L Final   ALT 01/12/2021 22  0 - 44 U/L Final   Alkaline Phosphatase 01/12/2021 65  38 - 126 U/L Final   Total Bilirubin 01/12/2021 0.6  0.3 - 1.2 mg/dL Final   GFR, Estimated 01/12/2021 >60  >60 mL/min Final   Comment: (NOTE) Calculated using the CKD-EPI Creatinine Equation (2021)    Anion gap 01/12/2021  9  5 - 15 Final   Performed at [facility], [address], [place], [place] [address]   Alcohol, Ethyl (B) 01/12/2021 325 (HH)  <10 mg/dL Final   Comment: CRITICAL RESULT CALLED TO, READ BACK BY AND VERIFIED WITH: SONYA, RN @ 1624 ON 01/12/2021 BY LBROOKS, MLT (NOTE) Lowest detectable limit for serum alcohol is 10 mg/dL.  For medical purposes only. Performed at [facility], [address] [place], [place] [address]    Salicylate Lvl 01/12/2021 <7.0 (L)  7.0 - 30.0 mg/dL Final   Performed at [facility], [address], [place], [place] [address]   Acetaminophen (Tylenol), Serum 01/12/2021 <10 (L)  10 - 30 ug/mL Final   Comment: (NOTE) Therapeutic concentrations vary significantly. A range of 10-30 ug/mL  may be an effective concentration for many patients. However, some  are best treated at concentrations outside of this range. Acetaminophen concentrations >150 ug/mL at 4 hours after ingestion  and >50 ug/mL at 12 hours after ingestion are often associated with  toxic reactions.  Performed at [facility], [address], [place], [place] [address]    WBC 01/12/2021 9.3  4.0 - 10.5 K/uL Final   RBC 01/12/2021 4.86  4.22 - 5.81 MIL/uL Final   Hemoglobin 01/12/2021 15.3  13.0 - 17.0 g/dL Final   HCT 86/57/8469 43.6  39.0 - 52.0 % Final   MCV 01/12/2021 89.7  80.0 - 100.0 fL Final   MCH 01/12/2021 31.5  26.0 - 34.0 pg Final   MCHC 01/12/2021 35.1  30.0 - 36.0 g/dL Final   RDW 62/95/2841 13.2  11.5 - 15.5 % Final   Platelets 01/12/2021 271  150 - 400 K/uL Final   nRBC 01/12/2021 0.0  0.0 - 0.2 % Final   Performed at [facility], [address] [place], [place] [address]   Opiates 01/12/2021 NONE DETECTED  NONE DETECTED Final   Cocaine 01/12/2021 NONE DETECTED  NONE DETECTED Final   Benzodiazepines 01/12/2021 NONE DETECTED  NONE DETECTED Final   Amphetamines 01/12/2021 NONE  DETECTED  NONE DETECTED Final   Tetrahydrocannabinol 01/12/2021 NONE DETECTED  NONE DETECTED Final   Barbiturates 01/12/2021 NONE DETECTED  NONE DETECTED Final   Comment: (NOTE) DRUG SCREEN FOR MEDICAL PURPOSES ONLY.  IF CONFIRMATION IS NEEDED FOR ANY PURPOSE, NOTIFY LAB WITHIN 5 DAYS.  LOWEST DETECTABLE LIMITS FOR URINE DRUG SCREEN Drug Class                     Cutoff (ng/mL) Amphetamine and metabolites    1000 Barbiturate and metabolites    200 Benzodiazepine                 200 Tricyclics and metabolites     300 Opiates and metabolites        300 Cocaine and metabolites        300 THC  50 Performed at [facility], [address], [place], [place] [address]    SARS Coronavirus 2 by RT PCR 01/12/2021 NEGATIVE  NEGATIVE Final   Comment: (NOTE) SARS-CoV-2 target nucleic acids are NOT DETECTED.  The SARS-CoV-2 RNA is generally detectable in upper respiratory specimens during the acute phase of infection. The lowest concentration of SARS-CoV-2 viral copies this assay can detect is 138 copies/mL. A negative result does not preclude SARS-Cov-2 infection and should not be used as the sole basis for treatment or other patient management decisions. A negative result may occur with  improper specimen collection/handling, submission of specimen other than nasopharyngeal swab, presence of viral mutation(s) within the areas targeted by this assay, and inadequate number of viral copies(<138 copies/mL). A negative result must be combined with clinical observations, patient history, and epidemiological information. The expected result is Negative.  Fact Sheet for Patients:  BloggerCourse.com  Fact Sheet for Healthcare Providers:  SeriousBroker.it  This test is no                          t yet approved or cleared by the [place] FDA and  has been authorized for detection and/or  diagnosis of SARS-CoV-2 by FDA under an Emergency Use Authorization (EUA). This EUA will remain  in effect (meaning this test can be used) for the duration of the COVID-19 declaration under Section 564(b)(1) of the Act, 21 U.S.C.section 360bbb-3(b)(1), unless the authorization is terminated  or revoked sooner.       Influenza A by PCR 01/12/2021 NEGATIVE  NEGATIVE Final   Influenza B by PCR 01/12/2021 NEGATIVE  NEGATIVE Final   Comment: (NOTE) The Xpert Xpress SARS-CoV-2/FLU/RSV plus assay is intended as an aid in the diagnosis of influenza from Nasopharyngeal swab specimens and should not be used as a sole basis for treatment. Nasal washings and aspirates are unacceptable for Xpert Xpress SARS-CoV-2/FLU/RSV testing.  Fact Sheet for Patients: BloggerCourse.com  Fact Sheet for Healthcare Providers: SeriousBroker.it  This test is not yet approved or cleared by the [place] FDA and has been authorized for detection and/or diagnosis of SARS-CoV-2 by FDA under an Emergency Use Authorization (EUA). This EUA will remain in effect (meaning this test can be used) for the duration of the COVID-19 declaration under Section 564(b)(1) of the Act, 21 U.S.C. section 360bbb-3(b)(1), unless the authorization is terminated or revoked.  Performed at [facility], [address], [place], [place] [address]   Admission on 11/14/2020, Discharged on 11/19/2020  Component Date Value Ref Range Status   RPR Ser Ql 11/14/2020 NON REACTIVE  NON REACTIVE Final   Performed at [facility], [address], [place], [place] [address]   Hepatitis B Surface Ag 11/14/2020 NON REACTIVE  NON REACTIVE Final   HCV Ab 11/14/2020 NON REACTIVE  NON REACTIVE Final   Comment: (NOTE) Nonreactive HCV antibody screen is consistent with no HCV infections,  unless recent infection is suspected or other evidence exists to indicate HCV  infection.     Hep A IgM 11/14/2020 NON REACTIVE  NON REACTIVE Final   Hep B C IgM 11/14/2020 NON REACTIVE  NON REACTIVE Final   Performed at [facility], [address], [place], [place] [address]   HIV Screen 4th Generation wRfx 11/14/2020 Non Reactive  Non Reactive Final   Performed at [facility], [address] [place], [place] [address]  Admission on 11/13/2020, Discharged on  11/14/2020  Component Date Value Ref Range Status   Sodium 11/13/2020 136  135 - 145 mmol/L Final   Potassium 11/13/2020 3.8  3.5 - 5.1 mmol/L Final   Chloride 11/13/2020 101  98 - 111 mmol/L Final   CO2 11/13/2020 24  22 - 32 mmol/L Final   Glucose, Bld 11/13/2020 102 (H)  70 - 99 mg/dL Final   Glucose reference range applies only to samples taken after fasting for at least 8 hours.   BUN 11/13/2020 5 (L)  6 - 20 mg/dL Final   Creatinine, Ser 11/13/2020 0.69  0.61 - 1.24 mg/dL Final   Calcium 16/12/9602 9.3  8.9 - 10.3 mg/dL Final   Total Protein 54/11/8117 7.3  6.5 - 8.1 g/dL Final   Albumin 14/78/2956 4.5  3.5 - 5.0 g/dL Final   AST 21/30/8657 21  15 - 41 U/L Final   ALT 11/13/2020 14  0 - 44 U/L Final   Alkaline Phosphatase 11/13/2020 97  38 - 126 U/L Final   Total Bilirubin 11/13/2020 1.3 (H)  0.3 - 1.2 mg/dL Final   GFR, Estimated 11/13/2020 >60  >60 mL/min Final   Comment: (NOTE) Calculated using the CKD-EPI Creatinine Equation (2021)    Anion gap 11/13/2020 11  5 - 15 Final   Performed at [facility], [address], [place], [place] [address]   Alcohol, Ethyl (B) 11/13/2020 <10  <10 mg/dL Final   Comment: (NOTE) Lowest detectable limit for serum alcohol is 10 mg/dL.  For medical purposes only. Performed at [facility], [address], [place], [place] [address]    WBC 11/13/2020 7.1  4.0 - 10.5 K/uL Final   RBC 11/13/2020 5.02  4.22 - 5.81 MIL/uL Final   Hemoglobin 11/13/2020 16.0  13.0 - 17.0 g/dL Final   HCT 41/32/4401 46.2  39.0 - 52.0 % Final   MCV  11/13/2020 92.0  80.0 - 100.0 fL Final   MCH 11/13/2020 31.9  26.0 - 34.0 pg Final   MCHC 11/13/2020 34.6  30.0 - 36.0 g/dL Final   RDW 02/72/5366 13.6  11.5 - 15.5 % Final   Platelets 11/13/2020 269  150 - 400 K/uL Final   nRBC 11/13/2020 0.0  0.0 - 0.2 % Final   Performed at [facility], [address] [place], [place] [address]   Opiates 11/13/2020 NONE DETECTED  NONE DETECTED Final   Cocaine 11/13/2020 NONE DETECTED  NONE DETECTED Final   Benzodiazepines 11/13/2020 NONE DETECTED  NONE DETECTED Final   Amphetamines 11/13/2020 NONE DETECTED  NONE DETECTED Final   Tetrahydrocannabinol 11/13/2020 NONE DETECTED  NONE DETECTED Final   Barbiturates 11/13/2020 NONE DETECTED  NONE DETECTED Final   Comment: (NOTE) DRUG SCREEN FOR MEDICAL PURPOSES ONLY.  IF CONFIRMATION IS NEEDED FOR ANY PURPOSE, NOTIFY LAB WITHIN 5 DAYS.  LOWEST DETECTABLE LIMITS FOR URINE DRUG SCREEN Drug Class                     Cutoff (ng/mL) Amphetamine and metabolites    1000 Barbiturate and metabolites    200 Benzodiazepine                 200 Tricyclics and metabolites     300 Opiates and metabolites        300 Cocaine and metabolites        300 THC  50 Performed at [facility], [address] [place], [place] [address]    Acetaminophen (Tylenol), Serum 11/13/2020 <10 (L)  10 - 30 ug/mL Final   Comment: (NOTE) Therapeutic concentrations vary significantly. A range of 10-30 ug/mL  may be an effective concentration for many patients. However, some  are best treated at concentrations outside of this range. Acetaminophen concentrations >150 ug/mL at 4 hours after ingestion  and >50 ug/mL at 12 hours after ingestion are often associated with  toxic reactions.  Performed at [facility], [address], [place], [place] [address]    Salicylate Lvl 11/13/2020 <7.0 (L)  7.0 - 30.0 mg/dL Final   Performed at [facility], [address], [place], [place]  [address]   SARS Coronavirus 2 by RT PCR 11/13/2020 NEGATIVE  NEGATIVE Final   Comment: (NOTE) SARS-CoV-2 target nucleic acids are NOT DETECTED.  The SARS-CoV-2 RNA is generally detectable in upper respiratory specimens during the acute phase of infection. The lowest concentration of SARS-CoV-2 viral copies this assay can detect is 138 copies/mL. A negative result does not preclude SARS-Cov-2 infection and should not be used as the sole basis for treatment or other patient management decisions. A negative result may occur with  improper specimen collection/handling, submission of specimen other than nasopharyngeal swab, presence of viral mutation(s) within the areas targeted by this assay, and inadequate number of viral copies(<138 copies/mL). A negative result must be combined with clinical observations, patient history, and epidemiological information. The expected result is Negative.  Fact Sheet for Patients:  BloggerCourse.com  Fact Sheet for Healthcare Providers:  SeriousBroker.it  This test is no                          t yet approved or cleared by the [place] FDA and  has been authorized for detection and/or diagnosis of SARS-CoV-2 by FDA under an Emergency Use Authorization (EUA). This EUA will remain  in effect (meaning this test can be used) for the duration of the COVID-19 declaration under Section 564(b)(1) of the Act, 21 U.S.C.section 360bbb-3(b)(1), unless the authorization is terminated  or revoked sooner.       Influenza A by PCR 11/13/2020 NEGATIVE  NEGATIVE Final   Influenza B by PCR 11/13/2020 NEGATIVE  NEGATIVE Final   Comment: (NOTE) The Xpert Xpress SARS-CoV-2/FLU/RSV plus assay is intended as an aid in the diagnosis of influenza from Nasopharyngeal swab specimens and should not be used as a sole basis for treatment. Nasal washings and aspirates are unacceptable for Xpert Xpress  SARS-CoV-2/FLU/RSV testing.  Fact Sheet for Patients: BloggerCourse.com  Fact Sheet for Healthcare Providers: SeriousBroker.it  This test is not yet approved or cleared by the [place] FDA and has been authorized for detection and/or diagnosis of SARS-CoV-2 by FDA under an Emergency Use Authorization (EUA). This EUA will remain in effect (meaning this test can be used) for the duration of the COVID-19 declaration under Section 564(b)(1) of the Act, 21 U.S.C. section 360bbb-3(b)(1), unless the authorization is terminated or revoked.  Performed at [facility], [address], [place], [place] [address]   Admission on 10/12/2020, Discharged on 10/16/2020  Component Date Value Ref Range Status   Valproic Acid Lvl 10/12/2020 29 (L)  50.0 - 100.0 ug/mL Final   Performed at [facility], [address], [place], [place] [address]  Admission on 10/11/2020, Discharged on 10/12/2020  Component Date Value  Ref Range Status   SARS Coronavirus 2 by RT PCR 10/11/2020 NEGATIVE  NEGATIVE Final   Comment: (NOTE) SARS-CoV-2 target nucleic acids are NOT DETECTED.  The SARS-CoV-2 RNA is generally detectable in upper respiratory specimens during the acute phase of infection. The lowest concentration of SARS-CoV-2 viral copies this assay can detect is 138 copies/mL. A negative result does not preclude SARS-Cov-2 infection and should not be used as the sole basis for treatment or other patient management decisions. A negative result may occur with  improper specimen collection/handling, submission of specimen other than nasopharyngeal swab, presence of viral mutation(s) within the areas targeted by this assay, and inadequate number of viral copies(<138 copies/mL). A negative result must be combined with clinical observations, patient history, and epidemiological information. The expected result is Negative.  Fact Sheet  for Patients:  BloggerCourse.com  Fact Sheet for Healthcare Providers:  SeriousBroker.it  This test is no                          t yet approved or cleared by the [place] FDA and  has been authorized for detection and/or diagnosis of SARS-CoV-2 by FDA under an Emergency Use Authorization (EUA). This EUA will remain  in effect (meaning this test can be used) for the duration of the COVID-19 declaration under Section 564(b)(1) of the Act, 21 U.S.C.section 360bbb-3(b)(1), unless the authorization is terminated  or revoked sooner.       Influenza A by PCR 10/11/2020 NEGATIVE  NEGATIVE Final   Influenza B by PCR 10/11/2020 NEGATIVE  NEGATIVE Final   Comment: (NOTE) The Xpert Xpress SARS-CoV-2/FLU/RSV plus assay is intended as an aid in the diagnosis of influenza from Nasopharyngeal swab specimens and should not be used as a sole basis for treatment. Nasal washings and aspirates are unacceptable for Xpert Xpress SARS-CoV-2/FLU/RSV testing.  Fact Sheet for Patients: BloggerCourse.com  Fact Sheet for Healthcare Providers: SeriousBroker.it  This test is not yet approved or cleared by the [place] FDA and has been authorized for detection and/or diagnosis of SARS-CoV-2 by FDA under an Emergency Use Authorization (EUA). This EUA will remain in effect (meaning this test can be used) for the duration of the COVID-19 declaration under Section 564(b)(1) of the Act, 21 U.S.C. section 360bbb-3(b)(1), unless the authorization is terminated or revoked.  Performed at [facility], [address], [place], [place] [address]    Sodium 10/11/2020 132 (L)  135 - 145 mmol/L Final   Potassium 10/11/2020 3.5  3.5 - 5.1 mmol/L Final   Chloride 10/11/2020 99  98 - 111 mmol/L Final   CO2 10/11/2020 23  22 - 32 mmol/L Final   Glucose, Bld 10/11/2020 134 (H)  70 - 99 mg/dL Final    Glucose reference range applies only to samples taken after fasting for at least 8 hours.   BUN 10/11/2020 11  6 - 20 mg/dL Final   Creatinine, Ser 10/11/2020 0.78  0.61 - 1.24 mg/dL Final   Calcium 60/45/4098 9.0  8.9 - 10.3 mg/dL Final   Total Protein 11/91/4782 6.9  6.5 - 8.1 g/dL Final   Albumin 95/62/1308 4.2  3.5 - 5.0 g/dL Final   AST 65/78/4696 24  15 - 41 U/L Final   ALT 10/11/2020 21  0 - 44 U/L Final   Alkaline Phosphatase 10/11/2020 77  38 - 126 U/L Final   Total Bilirubin 10/11/2020 1.1  0.3 - 1.2 mg/dL Final   GFR, Estimated  10/11/2020 >60  >60 mL/min Final   Comment: (NOTE) Calculated using the CKD-EPI Creatinine Equation (2021)    Anion gap 10/11/2020 10  5 - 15 Final   Performed at [facility], [address] [place], [place] [address]   Alcohol, Ethyl (B) 10/11/2020 <10  <10 mg/dL Final   Comment: (NOTE) Lowest detectable limit for serum alcohol is 10 mg/dL.  For medical purposes only. Performed at [facility], [address] [place], [place] [address]    Opiates 10/11/2020 NONE DETECTED  NONE DETECTED Final   Cocaine 10/11/2020 NONE DETECTED  NONE DETECTED Final   Benzodiazepines 10/11/2020 NONE DETECTED  NONE DETECTED Final   Amphetamines 10/11/2020 NONE DETECTED  NONE DETECTED Final   Tetrahydrocannabinol 10/11/2020 NONE DETECTED  NONE DETECTED Final   Barbiturates 10/11/2020 NONE DETECTED  NONE DETECTED Final   Comment: (NOTE) DRUG SCREEN FOR MEDICAL PURPOSES ONLY.  IF CONFIRMATION IS NEEDED FOR ANY PURPOSE, NOTIFY LAB WITHIN 5 DAYS.  LOWEST DETECTABLE LIMITS FOR URINE DRUG SCREEN Drug Class                     Cutoff (ng/mL) Amphetamine and metabolites    1000 Barbiturate and metabolites    200 Benzodiazepine                 200 Tricyclics and metabolites     300 Opiates and metabolites        300 Cocaine and metabolites        300 THC                            50 Performed at [facility], [address] [place],  [place] [address]    WBC 10/11/2020 10.3  4.0 - 10.5 K/uL Final   RBC 10/11/2020 4.48  4.22 - 5.81 MIL/uL Final   Hemoglobin 10/11/2020 13.7  13.0 - 17.0 g/dL Final   HCT 91/47/8295 39.6  39.0 - 52.0 % Final   MCV 10/11/2020 88.4  80.0 - 100.0 fL Final   MCH 10/11/2020 30.6  26.0 - 34.0 pg Final   MCHC 10/11/2020 34.6  30.0 - 36.0 g/dL Final   RDW 62/13/0865 12.6  11.5 - 15.5 % Final   Platelets 10/11/2020 362  150 - 400 K/uL Final   nRBC 10/11/2020 0.0  0.0 - 0.2 % Final   Neutrophils Relative % 10/11/2020 73  % Final   Neutro Abs 10/11/2020 7.4  1.7 - 7.7 K/uL Final   Lymphocytes Relative 10/11/2020 15  % Final   Lymphs Abs 10/11/2020 1.5  0.7 - 4.0 K/uL Final   Monocytes Relative 10/11/2020 10  % Final   Monocytes Absolute 10/11/2020 1.1 (H)  0.1 - 1.0 K/uL Final   Eosinophils Relative 10/11/2020 2  % Final   Eosinophils Absolute 10/11/2020 0.2  0.0 - 0.5 K/uL Final   Basophils Relative 10/11/2020 0  % Final   Basophils Absolute 10/11/2020 0.0  0.0 - 0.1 K/uL Final   Immature Granulocytes 10/11/2020 0  % Final   Abs Immature Granulocytes 10/11/2020 0.04  0.00 - 0.07 K/uL Final   Performed at [facility], [address] [place], [place] [address]   Salicylate Lvl 10/11/2020 <7.0 (L)  7.0 - 30.0 mg/dL Final   Performed at [facility], [address], [place], [place] [address]   Acetaminophen (Tylenol), Serum 10/11/2020 <10 (L)  10 -  30 ug/mL Final   Comment: (NOTE) Therapeutic concentrations vary significantly. A range of 10-30 ug/mL  may be an effective concentration for many patients. However, some  are best treated at concentrations outside of this range. Acetaminophen concentrations >150 ug/mL at 4 hours after ingestion  and >50 ug/mL at 12 hours after ingestion are often associated with  toxic reactions.  Performed at [facility], [address] [place], [place] [address]   Admission on 10/06/2020, Discharged on 10/07/2020  Component Date Value Ref Range  Status   WBC 10/06/2020 7.1  4.0 - 10.5 K/uL Final   RBC 10/06/2020 4.45  4.22 - 5.81 MIL/uL Final   Hemoglobin 10/06/2020 13.7  13.0 - 17.0 g/dL Final   HCT 84/69/629508/08/2020 40.3  39.0 - 52.0 % Final   MCV 10/06/2020 90.6  80.0 - 100.0 fL Final   MCH 10/06/2020 30.8  26.0 - 34.0 pg Final   MCHC 10/06/2020 34.0  30.0 - 36.0 g/dL Final   RDW 28/41/324408/08/2020 12.7  11.5 - 15.5 % Final   Platelets 10/06/2020 286  150 - 400 K/uL Final   nRBC 10/06/2020 0.0  0.0 - 0.2 % Final   Performed at [facility], [address] [place], [place] [address]   Salicylate Lvl 10/06/2020 <7.0 (L)  7.0 - 30.0 mg/dL Final   Performed at [facility], [address] [place], [place] [address]   Acetaminophen (Tylenol), Serum 10/06/2020 <10 (L)  10 - 30 ug/mL Final   Comment: (NOTE) Therapeutic concentrations vary significantly. A range of 10-30 ug/mL  may be an effective concentration for many patients. However, some  are best treated at concentrations outside of this range. Acetaminophen concentrations >150 ug/mL at 4 hours after ingestion  and >50 ug/mL at 12 hours after ingestion are often associated with  toxic reactions.  Performed at [facility], [address] [place], [place] [address]    Sodium 10/06/2020 137  135 - 145 mmol/L Final   Potassium 10/06/2020 3.1 (L)  3.5 - 5.1 mmol/L Final   Chloride 10/06/2020 100  98 - 111 mmol/L Final   CO2 10/06/2020 25  22 - 32 mmol/L Final   Glucose, Bld 10/06/2020 104 (H)  70 - 99 mg/dL Final   Glucose reference range applies only to samples taken after fasting for at least 8 hours.   BUN 10/06/2020 12  6 - 20 mg/dL Final   Creatinine, Ser 10/06/2020 0.62  0.61 - 1.24 mg/dL Final   Calcium 74/25/956308/08/2020 8.9  8.9 - 10.3 mg/dL Final   Total Protein 87/56/433208/08/2020 7.5  6.5 - 8.1 g/dL Final   Albumin 95/18/841608/08/2020 4.9  3.5 - 5.0 g/dL Final   AST 60/63/016008/08/2020 26  15 - 41 U/L Final   ALT 10/06/2020 31  0 - 44 U/L Final    Alkaline Phosphatase 10/06/2020 60  38 - 126 U/L Final   Total Bilirubin 10/06/2020 1.3 (H)  0.3 - 1.2 mg/dL Final   GFR, Estimated 10/06/2020 >60  >60 mL/min Final   Comment: (NOTE) Calculated using the CKD-EPI Creatinine Equation (2021)    Anion gap 10/06/2020 12  5 - 15 Final   Performed at [facility], [address] [place], [place] [address]   Alcohol, Ethyl (B) 10/06/2020 <10  <10 mg/dL Final   Comment: (NOTE) Lowest detectable limit for serum alcohol is 10 mg/dL.  For medical purposes only. Performed at [facility], [address]  [place], [place] [address]    SARS Coronavirus 2 by RT PCR 10/06/2020 NEGATIVE  NEGATIVE Final   Comment: (NOTE) SARS-CoV-2 target nucleic acids are NOT DETECTED.  The SARS-CoV-2 RNA is generally detectable in upper respiratory specimens during the acute phase of infection. The lowest concentration of SARS-CoV-2 viral copies this assay can detect is 138 copies/mL. A negative result does not preclude SARS-Cov-2 infection and should not be used as the sole basis for treatment or other patient management decisions. A negative result may occur with  improper specimen collection/handling, submission of specimen other than nasopharyngeal swab, presence of viral mutation(s) within the areas targeted by this assay, and inadequate number of viral copies(<138 copies/mL). A negative result must be combined with clinical observations, patient history, and epidemiological information. The expected result is Negative.  Fact Sheet for Patients:  BloggerCourse.com  Fact Sheet for Healthcare Providers:  SeriousBroker.it  This test is no                          t yet approved or cleared by the [place] FDA and  has been authorized for detection and/or diagnosis of SARS-CoV-2 by FDA under an Emergency Use Authorization (EUA). This EUA will remain  in effect  (meaning this test can be used) for the duration of the COVID-19 declaration under Section 564(b)(1) of the Act, 21 U.S.C.section 360bbb-3(b)(1), unless the authorization is terminated  or revoked sooner.       Influenza A by PCR 10/06/2020 NEGATIVE  NEGATIVE Final   Influenza B by PCR 10/06/2020 NEGATIVE  NEGATIVE Final   Comment: (NOTE) The Xpert Xpress SARS-CoV-2/FLU/RSV plus assay is intended as an aid in the diagnosis of influenza from Nasopharyngeal swab specimens and should not be used as a sole basis for treatment. Nasal washings and aspirates are unacceptable for Xpert Xpress SARS-CoV-2/FLU/RSV testing.  Fact Sheet for Patients: BloggerCourse.com  Fact Sheet for Healthcare Providers: SeriousBroker.it  This test is not yet approved or cleared by the [place] FDA and has been authorized for detection and/or diagnosis of SARS-CoV-2 by FDA under an Emergency Use Authorization (EUA). This EUA will remain in effect (meaning this test can be used) for the duration of the COVID-19 declaration under Section 564(b)(1) of the Act, 21 U.S.C. section 360bbb-3(b)(1), unless the authorization is terminated or revoked.  Performed at [facility], [address] [place], [place] [address]    Opiates 10/07/2020 NONE DETECTED  NONE DETECTED Final   Cocaine 10/07/2020 NONE DETECTED  NONE DETECTED Final   Benzodiazepines 10/07/2020 NONE DETECTED  NONE DETECTED Final   Amphetamines 10/07/2020 POSITIVE (A)  NONE DETECTED Final   Tetrahydrocannabinol 10/07/2020 NONE DETECTED  NONE DETECTED Final   Barbiturates 10/07/2020 NONE DETECTED  NONE DETECTED Final   Comment: (NOTE) DRUG SCREEN FOR MEDICAL PURPOSES ONLY.  IF CONFIRMATION IS NEEDED FOR ANY PURPOSE, NOTIFY LAB WITHIN 5 DAYS.  LOWEST DETECTABLE LIMITS FOR URINE DRUG SCREEN Drug Class                     Cutoff (ng/mL) Amphetamine and metabolites     1000 Barbiturate and metabolites    200 Benzodiazepine                 200 Tricyclics and metabolites     300 Opiates and metabolites        300 Cocaine and metabolites        300 THC  50 Performed at [facility], [address], [place], [place] [address]     Blood Alcohol level:  Lab Results  Component Value Date   ETH 42 (H) 04/04/2021   ETH 325 (HH) 01/12/2021    Metabolic Disorder Labs: Lab Results  Component Value Date   HGBA1C 5.2 09/27/2020   MPG 103 09/27/2020   MPG 102.54 01/24/2020   No results found for: PROLACTIN Lab Results  Component Value Date   CHOL 198 01/12/2021   TRIG 281 (H) 01/12/2021   HDL 76 01/12/2021   CHOLHDL 2.6 01/12/2021   VLDL 56 (H) 01/12/2021   LDLCALC 66 01/12/2021   LDLCALC 78 09/27/2020    Therapeutic Lab Levels: Lab Results  Component Value Date   LITHIUM 0.39 (L) 09/23/2020   LITHIUM 0.38 (L) 01/27/2020   Lab Results  Component Value Date   VALPROATE 29 (L) 10/12/2020   VALPROATE 66 10/01/2020   No components found for:  CBMZ  Physical Findings   AIMS    Flowsheet Row [facility]  AIMS Total Score 0      AUDIT    Flowsheet Row [facility] Ad[facility]  Alcohol Use Disorder Identification Test Final Score (AUDIT) 0 8      Flowsheet Row ED from 04/05/2021 in [facility] ED from 04/04/2021 in [facility]PART[facility] ED from [facility]/13/2021 in [facility]  C-SSRS RISK CATEGORY High Risk High Risk High Risk        Musculoskeletal  Strength & Muscle Tone: within normal limits Gait & Station: normal Patient leans: N/A  Psychiatric Specialty Exam  Presentation  General  Appearance: Appropriate for Environment  Eye Contact:Fair  Speech:Clear and Coherent  Speech Volume:Normal  Handedness:Right   Mood and Affect  Mood:Dysphoric  Affect:Congruent   Thought Process  Thought Processes:Coherent; Goal Directed  Descriptions of Associations:Intact  Orientation:Full (Time, Place and Person)  Thought Content:Logical  Diagnosis of Schizophrenia or Schizoaffective disorder in past: No    Hallucinations:Hallucinations: None  Ideas of Reference:None  Suicidal Thoughts:Suicidal Thoughts: No SI Active Intent and/or Plan: Without Intent; Without Plan SI Passive Intent and/or Plan: With Intent  Homicidal Thoughts:Homicidal Thoughts: No   Sensorium  Memory:Immediate Fair; Recent Fair; Remote Fair  Judgment:Fair  Insight:Fair   Executive Functions  Concentration:Fair  Attention Span:Fair  Recall:Fair  Fund of Knowledge:Fair  Language:Fair   Psychomotor Activity  Psychomotor Activity:Psychomotor Activity: Normal   Assets  Assets:Communication Skills; Desire for Improvement; Leisure Time; Physical Health; Resilience   Sleep  Sleep:Sleep: Fair   Nutritional Assessment (For OBS and [facility] admissions only) Has the patient had a decrease in food intake/or appetite?: No Does the patient have dental problems?: No Does the patient have eating habits or behaviors that may be indicators of an eating disorder including binging or inducing vomiting?: No Has the patient recently lost weight without trying?: 0 Has the patient been eating poorly because of a decreased appetite?: 0 Malnutrition Screening Tool Score: 0    Physical Exam  Physical Exam Constitutional:      Appearance: Normal appearance.  HENT:     Head: Normocephalic and atraumatic.     Nose: Nose normal.  Eyes:     Conjunctiva/sclera: Conjunctivae normal.  Musculoskeletal:        General: Normal range of  motion.  Neurological:     Mental Status: He is alert and  oriented to person, place, and time.   Review of Systems  Constitutional: Negative.   HENT: Negative.    Eyes: Negative.   Respiratory: Negative.    Cardiovascular: Negative.   Gastrointestinal: Negative.   Genitourinary: Negative.   Musculoskeletal: Negative.   Skin: Negative.   Neurological: Negative.   Endo/Heme/Allergies: Negative.   Blood pressure (!) 141/82, pulse 77, temperature 97.9 F (36.6 C), temperature source Tympanic, resp. rate 18, SpO2 99 %. There is no height or weight on file to calculate BMI.  Treatment Plan Summary: Patient continues treatment on the [facility] unit for mood stabilization and safety.   Continue Prozac 40 mg by mouth daily for MDD Continue Risperdal 2 mg by mouth nightly for mood disorder Continue Ativan 1 mg po as needed every 6 hours for CIWA greater than 10  Disposition: Ongoing-patient is requesting to go to [facility] for substance abuse treatment.  Layla Barter, NP 04/07/2021 9:49 AM

## 2021-04-07 NOTE — Progress Notes (Signed)
Patient awake on unit.  He ate lunch and is now watching tv without issue or complaint.  Will continue to monitor.

## 2021-04-07 NOTE — ED Notes (Signed)
Pt is in the bed sleeping. Respirations are even and unlabored. No acute distress noted. Will continue to monitor for safety. 

## 2021-04-07 NOTE — ED Notes (Signed)
Pt is in his bedroom sleeping, respirations are even and unlabored, environment check complete, will continue to monitor patient for safety.

## 2021-04-08 DIAGNOSIS — F1020 Alcohol dependence, uncomplicated: Secondary | ICD-10-CM | POA: Diagnosis not present

## 2021-04-08 DIAGNOSIS — R45851 Suicidal ideations: Secondary | ICD-10-CM | POA: Diagnosis not present

## 2021-04-08 DIAGNOSIS — F319 Bipolar disorder, unspecified: Secondary | ICD-10-CM | POA: Diagnosis not present

## 2021-04-08 DIAGNOSIS — Z79899 Other long term (current) drug therapy: Secondary | ICD-10-CM | POA: Diagnosis not present

## 2021-04-08 MED ORDER — TRAZODONE HCL 50 MG PO TABS: 50.0000 mg | ORAL_TABLET | Freq: Every evening | ORAL | 1 refills | Status: DC | PRN

## 2021-04-08 MED ORDER — HYDROXYZINE HCL 25 MG PO TABS
25.0000 mg | ORAL_TABLET | Freq: Three times a day (TID) | ORAL | Status: DC | PRN
  Administered 2021-04-08: 25 mg via ORAL
  Filled 2021-04-08: qty 1

## 2021-04-08 MED ORDER — FLUOXETINE HCL 40 MG PO CAPS: 40.0000 mg | ORAL_CAPSULE | Freq: Every day | ORAL | 1 refills | Status: DC

## 2021-04-08 MED ORDER — NICOTINE 21 MG/24HR TD PT24
MEDICATED_PATCH | TRANSDERMAL | Status: AC
  Filled 2021-04-08: qty 1

## 2021-04-08 MED ORDER — HYDROXYZINE HCL 25 MG PO TABS: 25.0000 mg | ORAL_TABLET | Freq: Four times a day (QID) | ORAL | 1 refills | Status: DC | PRN

## 2021-04-08 MED ORDER — RISPERIDONE 2 MG PO TABS: 2.0000 mg | ORAL_TABLET | Freq: Every day | ORAL | 1 refills | Status: DC

## 2021-04-08 MED ORDER — HYDROXYZINE HCL 25 MG PO TABS
25.0000 mg | ORAL_TABLET | Freq: Four times a day (QID) | ORAL | Status: DC | PRN
  Filled 2021-04-08: qty 10

## 2021-04-08 MED ORDER — NICOTINE 21 MG/24HR TD PT24
21.0000 mg | MEDICATED_PATCH | Freq: Every day | TRANSDERMAL | Status: DC
  Filled 2021-04-08: qty 7

## 2021-04-08 MED ORDER — ALBUTEROL SULFATE HFA 108 (90 BASE) MCG/ACT IN AERS: 1.0000 | INHALATION_SPRAY | RESPIRATORY_TRACT | 1 refills | Status: DC | PRN

## 2021-04-08 MED ORDER — TRAZODONE HCL 50 MG PO TABS
50.0000 mg | ORAL_TABLET | Freq: Every evening | ORAL | Status: DC | PRN
  Filled 2021-04-08: qty 7

## 2021-04-08 MED ORDER — NICOTINE 21 MG/24HR TD PT24: 21.0000 mg | MEDICATED_PATCH | Freq: Every day | TRANSDERMAL | 1 refills | Status: DC

## 2021-04-08 NOTE — ED Notes (Signed)
Patient is awake in bed at present without distress or complaint.  Patient is calm and pleasant however remains dysphoric.  Denies avh shi or plan at this time and will seek out staff if overwhelmed by thoughts or feelings.  Will monitor and provide safe supportive environment.

## 2021-04-08 NOTE — Progress Notes (Signed)
Assisted patient with contacting his [employer] administrator.  He was able to leave a voicemail for her with the number at facility to contact him.  Patient is trying to get his court appearance postponed until after he completes [facility].  Awaiting call back.

## 2021-04-08 NOTE — ED Notes (Signed)
Patient denies SI,HI,AVH.  Patient is cooperative and interacts well with staff. Respiratory is even and unlabored. No distress noted. Patient resting in bed at present. Will continue to monitor for safety.

## 2021-04-08 NOTE — ED Notes (Signed)
Pt is is in the bed sleeping. Respirations are even and unlabored. No acute distress noted. Will continue to monitor for Safety.

## 2021-04-08 NOTE — ED Notes (Signed)
Pt is currently sleeping, no distress noted, environmental check complete, will continue to monitor patient for safety. ? ?

## 2021-04-08 NOTE — ED Provider Notes (Signed)
FBC/OBS ASAP Discharge Summary  Date and Time: 04/08/2021 4:43 PM  Name: Brett House  MRN:  [id number]   Discharge Diagnoses:  Final diagnoses:  Bipolar I disorder, most recent episode depressed (HCC)  Alcohol use disorder, severe, dependence (HCC)    Subjective:  Patient seen and chart reviewed-patient has been medication compliant, has been appropriate with staff and peers on the unit and has been attending groups.  Patient interviewed this morning in the milieu, he is calm, cooperative and pleasant.  He states his weekend was "all right" and describes his mood is anxious.  Patient states that he is anxious as he has made several attempts to get into contact with his lawyer to push back his court date so that he could attend day [place].  Patient states that he has been unable to get in contact with his lawyer.  Patient denies any side effects to Risperdal that was started in place of Zyprexa.  He denies all physical symptoms and reports that his appetite is good.  Discussed with patient that the plan will be to discharge him to his court date tomorrow if his lawyer is unable to move his court date back so that a warrant would be issued for his arrest.  Patient was in agreement and verbalized understanding.  By the afternoon, patient's lawyer had not returned the call and discussed with patient that the plan at this point will be discharged tomorrow morning to the court house for his court date.  Patient verbalized understanding.  Patient was informed that he will be provided with 7-day samples as well as 30-day prescription for medications with 1 refill.  Advised patient that he can contact day Loraine Leriche on his own accord after going to his court date in order to obtain rehab placement.  Patient was in agreement and verbalized understanding.  On my interview, patient is in NAD, alert, oriented, calm, cooperative, and attentive, with normal affect, speech, and behavior. Objectively, there is no evidence of  psychosis/ mania (able to converse coherently, linear and goal directed thought, no RIS, no distractibility, not pre-occupied, no FOI, etc) nor depression to the point of suicidality (able to concentrate, affect full and reactive, speech normal r/v/t, no psychomotor retardation/agitation, etc).  Overall, patient appears to be at the point, in the absence of inhibiting or disinhibiting symptoms, where he can successfully move to lesser restrictive setting for care.  Stay Summary:  36 yo Male with history of bipolar disorder, substance use who presented to the ED and then was transferred to  the [name]  on 04/04/2021 for suicidal thoughts and substance use.  Patient was transferred to the [facility] on 2/3 for continued crisis stabilization.  UDS+cocaine, amphetamines, THC. Etoh 42.  Patient was initially restarted on his home medications while in the [facility].Marland Kitchen  Upon admission to the [name], he  reported his desire to change his Zyprexa to a different medicine as he did not feel that it was working.  Zyprexa 10 mg nightly was discontinued and Risperdal 2 mg nightly was started.  Prozac was continued at 40 mg.  Patient tolerated medication changes well without any SE/AE.  On 2/6 patient was unable to contact his lawyer in order to move his court date-has court date 2/7.  Patient was denying SI/HI, psychotic symptoms on 2/6 and was agreeable to discharge to his court date on 2/7 if his lawyer was unable to move the date.  See above for additional details.     Total Time spent with patient:  20 minutes  Past Psychiatric History: bipolar disorder, substance abuse Past Medical History:  Past Medical History:  Diagnosis Date   Anxiety    Asthma    Bipolar disorder (HCC)    Depression    History reviewed. No pertinent surgical history. Family History: History reviewed. No pertinent family history. Family Psychiatric History: Patient has a family hx of mental health illnesses. States that he is unsure of the Dx's. Yet,  recalls parents being hospitalized at psychiatric facilities. He also has a (paternal) cousin who made a successful suicide attempt Social History:  Social History   Substance and Sexual Activity  Alcohol Use Yes   Comment: 6 pack once a week for last 8-9 months     Social History   Substance and Sexual Activity  Drug Use Yes   Types: Marijuana   Comment: rarely    Social History   Socioeconomic History   Marital status: Single    Spouse name: Not on file   Number of children: 0   Years of education: Not on file   Highest education level: 12th grade  Occupational History   Occupation: Unemployed  Tobacco Use   Smoking status: Every Day    Packs/day: 1.50    Years: 15.00    Pack years: 22.50    Types: Cigarettes   Smokeless tobacco: Current  Vaping Use   Vaping Use: Never used  Substance and Sexual Activity   Alcohol use: Yes    Comment: 6 pack once a week for last 8-9 months   Drug use: Yes    Types: Marijuana    Comment: rarely   Sexual activity: Yes  Other Topics Concern   Not on file  Social History Narrative   Not on file   Social Determinants of Health   Financial Resource Strain: Not on file  Food Insecurity: Not on file  Transportation Needs: Not on file  Physical Activity: Not on file  Stress: Not on file  Social Connections: Not on file   SDOH:  SDOH Screenings   Alcohol Screen: Low Risk    Last Alcohol Screening Score (AUDIT): 0  Depression (PHQ2-9): Not on file  Financial Resource Strain: Not on file  Food Insecurity: Not on file  Housing: Not on file  Physical Activity: Not on file  Social Connections: Not on file  Stress: Not on file  Tobacco Use: High Risk   Smoking Tobacco Use: Every Day   Smokeless Tobacco Use: Current   Passive Exposure: Not on file  Transportation Needs: Not on file    Tobacco Cessation:  A prescription for an FDA-approved tobacco cessation medication provided at discharge  Current Medications:  Current  Facility-Administered Medications  Medication Dose Route Frequency Provider Last Rate Last Admin   acetaminophen (TYLENOL) tablet 650 mg  650 mg Oral Q6H PRN [name] A, NP   650 mg at 04/07/21 1347   albuterol (VENTOLIN HFA) 108 (90 Base) MCG/ACT inhaler 1-2 puff  1-2 puff Inhalation Q4H PRN Estella Husk, MD       alum & mag hydroxide-simeth (MAALOX/MYLANTA) 200-200-20 MG/5ML suspension 30 mL  30 mL Oral Q4H PRN [name] A, NP       FLUoxetine (PROZAC) capsule 40 mg  40 mg Oral Daily [name] A, NP   40 mg at 04/08/21 1007   folic acid (FOLVITE) tablet 1 mg  1 mg Oral Daily [name] A, NP   1 mg at 04/08/21 1007   hydrOXYzine (ATARAX) tablet  25 mg  25 mg Oral TID PRN Estella HuskLaubach, Daina Cara S, MD   25 mg at 04/08/21 1602   magnesium hydroxide (MILK OF MAGNESIA) suspension 30 mL  30 mL Oral Daily PRN Jackelyn PolingBerry, Jason A, NP       multivitamin with minerals tablet 1 tablet  1 tablet Oral Daily Nira ConnBerry, Jason A, NP   1 tablet at 04/08/21 1007   nicotine (NICODERM CQ - dosed in mg/24 hours) patch 21 mg  21 mg Transdermal Daily Estella HuskLaubach, Alberto Schoch S, MD       risperiDONE (RISPERDAL) tablet 2 mg  2 mg Oral QHS Estella HuskLaubach, Gaelen Brager S, MD   2 mg at 04/07/21 2156   thiamine tablet 100 mg  100 mg Oral Daily Nira ConnBerry, Jason A, NP   100 mg at 04/08/21 1007   traZODone (DESYREL) tablet 50 mg  50 mg Oral QHS PRN Estella HuskLaubach, Zola Runion S, MD       Current Outpatient Medications  Medication Sig Dispense Refill   albuterol (VENTOLIN HFA) 108 (90 Base) MCG/ACT inhaler Inhale 1-2 puffs into the lungs every 4 (four) hours as needed for wheezing or shortness of breath. 1 each 1   FLUoxetine (PROZAC) 40 MG capsule Take 1 capsule (40 mg total) by mouth daily. 30 capsule 1   hydrOXYzine (ATARAX) 25 MG tablet Take 1 tablet (25 mg total) by mouth every 6 (six) hours as needed for anxiety. 30 tablet 1   [START ON 04/09/2021] nicotine (NICODERM CQ - DOSED IN MG/24 HOURS) 21 mg/24hr patch Place 1 patch (21 mg total) onto  the skin daily. 28 patch 1   risperiDONE (RISPERDAL) 2 MG tablet Take 1 tablet (2 mg total) by mouth at bedtime. 30 tablet 1   traZODone (DESYREL) 50 MG tablet Take 1 tablet (50 mg total) by mouth at bedtime as needed for sleep. 30 tablet 1    PTA Medications: (Not in a hospital admission)   Musculoskeletal  Strength & Muscle Tone: within normal limits Gait & Station: normal Patient leans: N/A  Psychiatric Specialty Exam  Presentation  General Appearance: Appropriate for Environment; Casual  Eye Contact:Good  Speech:Clear and Coherent; Normal Rate  Speech Volume:Normal  Handedness:Right   Mood and Affect  Mood:Anxious  Affect:Appropriate; Congruent (anxious)   Thought Process  Thought Processes:Coherent; Goal Directed; Linear  Descriptions of Associations:Intact  Orientation:Full (Time, Place and Person)  Thought Content:WDL; Logical  Diagnosis of Schizophrenia or Schizoaffective disorder in past: No    Hallucinations:Hallucinations: None  Ideas of Reference:None  Suicidal Thoughts:Suicidal Thoughts: No  Homicidal Thoughts:Homicidal Thoughts: No   Sensorium  Memory:Immediate Good; Recent Good; Remote Good  Judgment:Fair  Insight:Good   Executive Functions  Concentration:Good  Attention Span:Good  Recall:Good  Fund of Knowledge:Good  Language:Good   Psychomotor Activity  Psychomotor Activity:Psychomotor Activity: Normal   Assets  Assets:Communication Skills; Desire for Improvement; Physical Health; Resilience   Sleep  Sleep:Sleep: Fair   No data recorded  Physical Exam  Physical Exam Constitutional:      Appearance: Normal appearance. He is normal weight.  HENT:     Head: Normocephalic and atraumatic.  Eyes:     Extraocular Movements: Extraocular movements intact.  Pulmonary:     Effort: Pulmonary effort is normal.  Neurological:     General: No focal deficit present.     Mental Status: He is alert and oriented to person,  place, and time.  Psychiatric:        Attention and Perception: Attention and perception normal.  Speech: Speech normal.        Behavior: Behavior normal. Behavior is cooperative.        Thought Content: Thought content normal.   Review of Systems  Constitutional:  Negative for chills and fever.  HENT:  Negative for hearing loss.   Eyes:  Negative for discharge and redness.  Respiratory:  Negative for cough.   Cardiovascular:  Negative for chest pain.  Gastrointestinal:  Negative for abdominal pain.  Musculoskeletal:  Negative for myalgias.  Neurological:  Negative for headaches.  Psychiatric/Behavioral:  Positive for substance abuse. Negative for depression, hallucinations and suicidal ideas. The patient is nervous/anxious.   Blood pressure 118/67, pulse 68, temperature 98 F (36.7 C), temperature source Temporal, resp. rate 16, SpO2 98 %. There is no height or weight on file to calculate BMI.  Demographic Factors:  Male, Caucasian, Low socioeconomic status, and Unemployed  Loss Factors: Legal issues and Financial problems/change in socioeconomic status  Historical Factors: Prior suicide attempts, Family history of suicide, Family history of mental illness or substance abuse, and Impulsivity  Risk Reduction Factors:   Positive social support, Positive coping skills or problem solving skills, and oriented to the future, focused on receiving treatment for substance abuse, low barrier for seeking psychiatric assistance  Continued Clinical Symptoms:  Bipolar Disorder:   Depressive phase Alcohol/Substance Abuse/Dependencies Previous Psychiatric Diagnoses and Treatments  Cognitive Features That Contribute To Risk:  None    Suicide Risk:  Minimal: No identifiable suicidal ideation.  Patients presenting with no risk factors but with morbid ruminations; may be classified as minimal risk based on the severity of the depressive symptoms  Plan Of Care/Follow-up recommendations:   Activity:  as tolerated Diet:  regular Other:     Take all medications as prescribed by his/her mental healthcare provider. Report any adverse effects and or reactions from the medicines to your outpatient provider promptly. Do not engage in alcohol and or illegal drug use while on prescription medicines. In the event of worsening symptoms, call the crisis hotline, 911 and or go to the nearest ED for appropriate evaluation and treatment of symptoms. follow-up with your primary care provider for your other medical issues, concerns and or health care needs.  Allergies as of 04/08/2021       Reactions   Erythromycin Other (See Comments)   Unknown childhood allergic reaction        Medication List     STOP taking these medications    multivitamin with minerals Tabs tablet   OLANZapine 10 MG tablet Commonly known as: ZYPREXA   thiamine 100 MG tablet       TAKE these medications    albuterol 108 (90 Base) MCG/ACT inhaler Commonly known as: VENTOLIN HFA Inhale 1-2 puffs into the lungs every 4 (four) hours as needed for wheezing or shortness of breath.   FLUoxetine 40 MG capsule Commonly known as: PROZAC Take 1 capsule (40 mg total) by mouth daily.   hydrOXYzine 25 MG tablet Commonly known as: ATARAX Take 1 tablet (25 mg total) by mouth every 6 (six) hours as needed for anxiety.   nicotine 21 mg/24hr patch Commonly known as: NICODERM CQ - dosed in mg/24 hours Place 1 patch (21 mg total) onto the skin daily. Start taking on: April 09, 2021 What changed: when to take this   risperiDONE 2 MG tablet Commonly known as: RISPERDAL Take 1 tablet (2 mg total) by mouth at bedtime.   traZODone 50 MG tablet Commonly known as: DESYREL Take 1  tablet (50 mg total) by mouth at bedtime as needed for sleep.        Patient was provided with 7 day samples of above medications as well as paper prescriptions for 30 days with one refill at time of discharge.  Patient was provided  with follow up information regarding psychiatric outpatient resources and substance use resources in AVS with the assistance of SW prior to discharge.      Disposition: self care  Estella HuskKatherine S Terin Cragle, MD 04/08/2021, 4:43 PM

## 2021-04-08 NOTE — Progress Notes (Signed)
Patient has been awake and alert all afternoon.  He is calm and cooperative with care.  Patient has been watching tv.  He ate lunch and is comfortable on unit.  Patient is mildly anxious about his court date tomorrow as he has not heard back from his lawyer yet.  If there is no response he is scheduled for discharge in the a.m. All discharge paperwork is in chart including a taxi voucher.  Sample meds are in med room.  Will endorse plan to night shift.

## 2021-04-08 NOTE — Progress Notes (Signed)
Patient complained of anxiety and requested vistoril which was given .

## 2021-04-08 NOTE — ED Notes (Signed)
Snacks given 

## 2021-04-08 NOTE — Group Note (Unsigned)
Group Topic: Wellness  Group Date: 04/08/2021 Start Time: 1000 End Time: 1030 Facilitators: Laury Axon E  Department: [facility]  Number of Participants: 3  Group Focus: activities of daily living skills Treatment Modality:  Behavior Modification Therapy Interventions utilized were patient education Purpose: enhance coping skills   Name: Brett House Date of Birth: 1986-01-29  MR: [id number]    Level of Participation: {THERAPIES; PSYCH GROUP PARTICIPATION VM:[id number] Quality of Participation: {THERAPIES; PSYCH QUALITY OF PARTICIPATION:23992} Interactions with others: {THERAPIES; PSYCH INTERACTIONS:23993} Mood/Affect: {THERAPIES; PSYCH MOOD/AFFECT:23994} Triggers (if applicable): *** Cognition: {THERAPIES; PSYCH COGNITION:23995} Progress: {THERAPIES; PSYCH PROGRESS:23997} Response: *** Plan: {THERAPIES; PSYCH LW:[id number]  Patients Problems:  Patient Active Problem List   Diagnosis Date Noted   Polysubstance abuse ([place]) 04/06/2021   Bipolar 1 disorder, depressed ([place]) 04/05/2021   Bipolar 1 disorder ([place]) 11/14/2020   Bipolar 1 disorder, depressed, severe ([place]) 10/12/2020   Suicidal thoughts    Substance induced mood disorder ([place]) 10/07/2020   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) 01/25/2020

## 2021-04-08 NOTE — Discharge Instructions (Addendum)
Please come to [facility] (this facility) during walk in hours for appointment with psychiatrist for further medication management and for therapists for therapy.    Walk in hours are 8-11 AM Monday through Thursday for medication management.Therapy walk in hours are Monday-Wednesday 8 AM-1PM.   It is first come, first -serve; it is best to arrive by 7:00 AM.   On Friday from 1 pm to 4 pm for therapy intake only. Please arrive by 12:00 pm as it is  first come, first -serve.    When you arrive please go upstairs for your appointment. If you are unsure of where to go, inform the front desk that you are here for a walk in appointment and they will assist you with directions upstairs.  Address:  [address] in [place], [place] Ph: [phone]     Take all medications as prescribed by his/her mental healthcare provider. Report any adverse effects and or reactions from the medicines to your outpatient provider promptly. Do not engage in alcohol and or illegal drug use while on prescription medicines. In the event of worsening symptoms, call the crisis hotline, 911 and or go to the nearest ED for appropriate evaluation and treatment of symptoms. follow-up with your primary care provider for your other medical issues, concerns and or health care needs.  Substance Abuse Resources  [facility] - Admissions are currently completed Monday through Friday at [place]; both appointments and walk-ins are accepted.  Any individual that is a [facility] resident may present for a substance abuse screening and assessment for admission.  A person may be referred by numerous sources or self-refer.   Potential clients will be screened for medical necessity and appropriateness for the program.  Clients must meet criteria for high-intensity residential treatment services.  If clinically appropriate, a client will continue with the comprehensive  clinical assessment and intake process, as well as enrollment in the [facility].   Address: [address] [place], [place] [address] Admin Hours: Mon-Fri 8AM to [place] Hours: 24/7 Phone: [phone] Fax: [phone]   [facility]) Facility Based Crisis:  These are 3 locations for services: Please call before arrival    Address: [address] [place], [place] [address] Phone: [phone]   Address: [address] [place], [place] [address] Phone#: [phone]   Address: [address] [place], [place] [address] Phone#: [phone]     [facility] ([facility]): (offers outpatient therapy and intensive outpatient substance abuse therapy).  [address] [place], [place] [address] Phone: [phone]   [facility]: Offers FREE recovery skills classes, support groups, 1:1 Peer Support, and Compeer Classes. [address], [place], [place] [address] Phone: [phone] (Call to complete intake).  [facility] [address] [place],  [address] Phone: [phone] ext: [facility] provides food, shelter and other programs and services to the homeless men of [place] through our [employer].   By offering safe shelter, three meals a day, clean clothing, Biblical counseling, financial planning, vocational training, GED/education and employment assistance, we've helped mend the shattered lives of many homeless men since opening in 1974.   We have approximately 267 beds available, with a max of 312 beds including mats for emergency situations and currently house an average of 270 men a night.   Prospective Client Check-In Information Photo ID Required (  State/ Out of State/ [facility]) - if photo ID is not available, clients are required to have a printout of a police/sheriff's criminal history report. Help out with chores around the [place]. No  sex offender of any type (pending, charged, registered and/or any other sex related offenses) will be permitted to check in. Must be willing to abide by all rules, regulations, and policies established by the [employer]. The following will be provided - shelter, food, clothing, and biblical counseling. If you or someone you know is in need of assistance at our [facility] shelter in [place], [place], please call [phone] ext. [place]:[phone].   [facility]-will provide timely access to mental health services for children and adolescents (4-17) and adults presenting in a mental health crisis. The program is designed for those who need urgent Behavioral Health or Substance Use treatment and are not experiencing a medical crisis that would typically require an emergency room visit.    [place], [place] [address] Phone: [phone] [email or web address]: Phone#: [phone]   The [facility] SA Intensive Outpatient Program (SAIOP) means structured individual and group addiction activities and services that are provided at an outpatient program designed to assist adult and adolescent consumers to begin recovery and learn skills for recovery maintenance. The [facility] program is offered at least 3 hours a day, 3 days a week.SAIOP services shall include a structured program consisting of, but not limited to, the following services: Individual counseling and support; Group counseling and support; Family counseling, training or support; Biochemical assays to identify recent drug use (e.g., urine drug screens); Strategies for relapse prevention to include community and social support systems in treatment; Life skills; Crisis contingency planning; Disease Management; and Treatment support activities that have been adapted or specifically designed for persons with physical disabilities, or persons with co-occurring  disorders of mental illness and substance abuse/dependence or mental retardation/developmental disability and substance abuse/dependence. Phone: [phone]     [facility] [place]: [phone]  [place]: [phone]

## 2021-04-09 DIAGNOSIS — Z79899 Other long term (current) drug therapy: Secondary | ICD-10-CM | POA: Diagnosis not present

## 2021-04-09 DIAGNOSIS — R45851 Suicidal ideations: Secondary | ICD-10-CM | POA: Diagnosis not present

## 2021-04-09 DIAGNOSIS — F319 Bipolar disorder, unspecified: Secondary | ICD-10-CM | POA: Diagnosis not present

## 2021-04-09 DIAGNOSIS — F1020 Alcohol dependence, uncomplicated: Secondary | ICD-10-CM | POA: Diagnosis not present

## 2021-04-09 NOTE — ED Notes (Signed)
Pt requested for bus pass but there was no pass to give him to get from the court explained to pt and he understood.

## 2021-04-09 NOTE — ED Notes (Signed)
Pt is waiting on his ride to come pick him up to take him to court at 7am.

## 2021-04-09 NOTE — ED Notes (Signed)
Pt discharged with  AVS.  AVS reviewed prior to discharge.  Pt alert, oriented, and ambulatory.  Safety maintained.  °

## 2021-04-09 NOTE — ED Notes (Signed)
Pt is in the bed sleeping. Respirations are even and unlabored. No acute distress noted. Will continue to monitor for safety. 

## 2021-04-09 NOTE — ED Notes (Signed)
Nurse called [employer] to schedule for pick up for pt. Dispatcher told nurse there is no taxi available for the time requested. Dispatcher asked this nurse to call back around 6:30am-7am . Nurse will follow up at 6:30am.

## 2021-04-29 ENCOUNTER — Other Ambulatory Visit: Payer: Self-pay

## 2021-04-29 ENCOUNTER — Inpatient Hospital Stay (HOSPITAL_COMMUNITY)
Admission: EM | Admit: 2021-04-29 | Discharge: 2021-05-05 | DRG: 917 | Disposition: A | Discharge status: Home / Self Care | Payer: Self-pay | Attending: Family Medicine | Admitting: Family Medicine

## 2021-04-29 ENCOUNTER — Emergency Department (HOSPITAL_COMMUNITY): Payer: Self-pay

## 2021-04-29 ENCOUNTER — Encounter (HOSPITAL_COMMUNITY): Payer: Self-pay

## 2021-04-29 DIAGNOSIS — F10932 Alcohol use, unspecified with withdrawal with perceptual disturbance: Secondary | ICD-10-CM

## 2021-04-29 DIAGNOSIS — J45909 Unspecified asthma, uncomplicated: Secondary | ICD-10-CM | POA: Diagnosis present

## 2021-04-29 DIAGNOSIS — Z91199 Patient's noncompliance with other medical treatment and regimen due to unspecified reason: Secondary | ICD-10-CM

## 2021-04-29 DIAGNOSIS — F319 Bipolar disorder, unspecified: Secondary | ICD-10-CM | POA: Diagnosis present

## 2021-04-29 DIAGNOSIS — G928 Other toxic encephalopathy: Secondary | ICD-10-CM | POA: Diagnosis present

## 2021-04-29 DIAGNOSIS — F419 Anxiety disorder, unspecified: Secondary | ICD-10-CM | POA: Diagnosis present

## 2021-04-29 DIAGNOSIS — F17210 Nicotine dependence, cigarettes, uncomplicated: Secondary | ICD-10-CM | POA: Diagnosis present

## 2021-04-29 DIAGNOSIS — Z20822 Contact with and (suspected) exposure to covid-19: Secondary | ICD-10-CM | POA: Diagnosis present

## 2021-04-29 DIAGNOSIS — F1510 Other stimulant abuse, uncomplicated: Secondary | ICD-10-CM | POA: Diagnosis present

## 2021-04-29 DIAGNOSIS — Z881 Allergy status to other antibiotic agents status: Secondary | ICD-10-CM

## 2021-04-29 DIAGNOSIS — T401X1A Poisoning by heroin, accidental (unintentional), initial encounter: Principal | ICD-10-CM | POA: Diagnosis present

## 2021-04-29 DIAGNOSIS — I959 Hypotension, unspecified: Secondary | ICD-10-CM | POA: Diagnosis not present

## 2021-04-29 DIAGNOSIS — F1410 Cocaine abuse, uncomplicated: Secondary | ICD-10-CM | POA: Diagnosis present

## 2021-04-29 DIAGNOSIS — T50901A Poisoning by unspecified drugs, medicaments and biological substances, accidental (unintentional), initial encounter: Secondary | ICD-10-CM

## 2021-04-29 DIAGNOSIS — F1910 Other psychoactive substance abuse, uncomplicated: Secondary | ICD-10-CM | POA: Diagnosis present

## 2021-04-29 DIAGNOSIS — R780 Finding of alcohol in blood: Secondary | ICD-10-CM

## 2021-04-29 DIAGNOSIS — F10231 Alcohol dependence with withdrawal delirium: Secondary | ICD-10-CM | POA: Diagnosis present

## 2021-04-29 DIAGNOSIS — Z720 Tobacco use: Secondary | ICD-10-CM

## 2021-04-29 DIAGNOSIS — Z781 Physical restraint status: Secondary | ICD-10-CM

## 2021-04-29 DIAGNOSIS — Y906 Blood alcohol level of 120-199 mg/100 ml: Secondary | ICD-10-CM | POA: Diagnosis present

## 2021-04-29 DIAGNOSIS — T40601A Poisoning by unspecified narcotics, accidental (unintentional), initial encounter: Secondary | ICD-10-CM

## 2021-04-29 LAB — RESP PANEL BY RT-PCR (FLU A&B, COVID) ARPGX2
Influenza A by PCR: NEGATIVE
Influenza B by PCR: NEGATIVE
SARS Coronavirus 2 by RT PCR: NEGATIVE

## 2021-04-29 LAB — ETHANOL: Alcohol, Ethyl (B): 181 mg/dL — ABNORMAL HIGH (ref <10)

## 2021-04-29 LAB — CBG MONITORING, ED: Glucose-Capillary: 149 mg/dL — ABNORMAL HIGH (ref 70–99)

## 2021-04-29 MED ORDER — NALOXONE HCL 2 MG/2ML IJ SOSY
2.0000 mg | PREFILLED_SYRINGE | INTRAMUSCULAR | Status: AC | PRN
  Administered 2021-04-29: 2 mg via INTRAVENOUS
  Filled 2021-04-29: qty 2

## 2021-04-29 MED ORDER — NALOXONE HCL 2 MG/2ML IJ SOSY
PREFILLED_SYRINGE | INTRAMUSCULAR | Status: AC
  Filled 2021-04-29: qty 2

## 2021-04-29 MED ORDER — NALOXONE HCL 4 MG/10ML IJ SOLN
1.5000 mg/h | INTRAVENOUS | Status: DC
  Administered 2021-04-29 – 2021-04-30 (×5): 1.5 mg/h via INTRAVENOUS
  Filled 2021-04-29 (×9): qty 10

## 2021-04-29 MED ORDER — ONDANSETRON HCL 4 MG/2ML IJ SOLN: 4.0000 mg | Freq: Three times a day (TID) | INTRAMUSCULAR | Status: DC | PRN

## 2021-04-29 NOTE — ED Triage Notes (Signed)
Pt BIB [facility] from [facility] [employer]. Patient was seen in the middle of the road unconscious. EMS gave 2 of narcan intranasal and 1 IM.

## 2021-04-29 NOTE — ED Provider Notes (Addendum)
[facility]-EMERGENCY DEPT Provider Note   CSN: [id number] Arrival date & time: 04/29/21  1735     History  No chief complaint on file.   Brett House is a 36 y.o. male.  HPI    36 year old male presenting to the ED with concern for opiate overdose. Found by EMS and PD acutely intoxicated, somnolent, decreased respirations and pinpoint pupils. Received IV Narcan with good initial response and did require re-dosing per EMS. He received a total of 1mg  IV Narcan and 2mg  intranasal.  On arrival, the patient was GCS 13, ABC intact, acutely intoxicated and somnolent with pinpoint pupils concerning for opiate overdose. CBG with EMS was normal. After IV Narcan 2mg , the patient was more alert and acknowledged drinking alcohol today. He is not sure of any other substance use.   Home Medications Prior to Admission medications   Medication Sig Start Date End Date Taking? Authorizing Provider  albuterol (VENTOLIN HFA) 108 (90 Base) MCG/ACT inhaler Inhale 1-2 puffs into the lungs every 4 (four) hours as needed for wheezing or shortness of breath. 04/08/21 05/08/21 Yes Estella Husk, MD  FLUoxetine (PROZAC) 40 MG capsule Take 1 capsule (40 mg total) by mouth daily. 04/08/21  Yes Estella Husk, MD  hydrOXYzine (ATARAX) 25 MG tablet Take 1 tablet (25 mg total) by mouth every 6 (six) hours as needed for anxiety. 04/08/21  Yes Estella Husk, MD  nicotine (NICODERM CQ - DOSED IN MG/24 HOURS) 21 mg/24hr patch Place 1 patch (21 mg total) onto the skin daily. 04/09/21  Yes Estella Husk, MD  risperiDONE (RISPERDAL) 2 MG tablet Take 1 tablet (2 mg total) by mouth at bedtime. 04/08/21  Yes Estella Husk, MD  traZODone (DESYREL) 50 MG tablet Take 1 tablet (50 mg total) by mouth at bedtime as needed for sleep. 04/08/21  Yes Estella Husk, MD      Allergies    Erythromycin    Review of Systems   Review of Systems  Unable to perform ROS: Mental status change    Physical Exam Updated Vital Signs BP (!) 102/56    Pulse 64    Temp (!) 97.4 F (36.3 C) (Oral)    Resp 13    Ht 5\' 10"  (1.778 m)    Wt 75 kg    SpO2 96%    BMI 23.72 kg/m  Physical Exam Vitals and nursing note reviewed.  Constitutional:      General: He is not in acute distress.    Appearance: He is diaphoretic.     Comments: GCS13, ABC intact, acutely intoxicated.  HENT:     Head: Normocephalic and atraumatic.  Eyes:     Conjunctiva/sclera: Conjunctivae normal.     Pupils: Pupils are equal, round, and reactive to light.  Cardiovascular:     Rate and Rhythm: Normal rate and regular rhythm.     Heart sounds: Normal heart sounds.  Pulmonary:     Effort: No respiratory distress.     Comments: Decreased respiratory effort Abdominal:     General: There is no distension.     Tenderness: There is no abdominal tenderness. There is no guarding.  Musculoskeletal:        General: No deformity or signs of injury.     Cervical back: Neck supple.  Skin:    Findings: No lesion or rash.  Neurological:     Mental Status: He is alert. He is disoriented.     Comments: Somnolent,  acutely intoxicated    ED Results / Procedures / Treatments   Labs (all labs ordered are listed, but only abnormal results are displayed) Labs Reviewed  ETHANOL - Abnormal; Notable for the following components:      Result Value   Alcohol, Ethyl (B) 181 (*)    All other components within normal limits  CBG MONITORING, ED - Abnormal; Notable for the following components:   Glucose-Capillary 149 (*)    All other components within normal limits  RESP PANEL BY RT-PCR (FLU A&B, COVID) [id number]    EKG EKG Interpretation  Date/Time:  Monday April 29 2021 17:46:46 EST Ventricular Rate:  90 PR Interval:  137 QRS Duration: 86 QT Interval:  477 QTC Calculation: 584 R Axis:   48 Text Interpretation: Sinus rhythm Probable anteroseptal infarct, old Prolonged QT interval Confirmed by Ernie Avena ([id number]) on  04/29/2021 5:57:00 PM  Radiology DG Chest Portable 1 View  Result Date: 04/29/2021 CLINICAL DATA:  Overdose EXAM: PORTABLE CHEST 1 VIEW COMPARISON:  Chest x-ray dated September 23, 2020 FINDINGS: Cardiac and mediastinal contours are within normal limits. Lungs are clear. No large pleural effusion or evidence of pneumothorax. IMPRESSION: No acute cardiopulmonary abnormality. Electronically Signed   By: Allegra Lai M.D.   On: 04/29/2021 18:23    Procedures .Critical Care Performed by: Ernie Avena, MD Authorized by: Ernie Avena, MD   Critical care provider statement:    Critical care time (minutes):  35   Critical care was necessary to treat or prevent imminent or life-threatening deterioration of the following conditions:  Respiratory failure and toxidrome   Critical care was time spent personally by me on the following activities:  Development of treatment plan with patient or surrogate, examination of patient, obtaining history from patient or surrogate, ordering and performing treatments and interventions, ordering and review of laboratory studies, ordering and review of radiographic studies, pulse oximetry, re-evaluation of patient's condition and review of old charts    Medications Ordered in ED Medications  naloxone HCl (NARCAN) 4 mg in dextrose 5 % 250 mL infusion (1.5 mg/hr Intravenous New Bag/Given 04/29/21 2116)  naloxone [facility]) 2 MG/2ML injection (  Canceled Entry 04/29/21 1838)  naloxone [facility]) injection 2 mg (2 mg Intravenous Given 04/29/21 1822)    ED Course/ Medical Decision Making/ A&P Clinical Course as of 04/29/21 2316  Mon Apr 29, 2021  1824 Glucose-Capillary(!): 149 [JL]  1827 Alcohol, Ethyl (B)(!): 181 [JL]    Clinical Course User Index [JL] Ernie Avena, MD                           Medical Decision Making Amount and/or Complexity of Data Reviewed Labs: ordered. Decision-making details documented in ED Course. Radiology: ordered.  Risk Prescription  drug management. Decision regarding hospitalization.   36 year old male presenting to the ED with concern for opiate overdose. Found by EMS and PD acutely intoxicated, somnolent, decreased respirations and pinpoint pupils. Received IV Narcan with good initial response and did require re-dosing per EMS. He received a total of 1mg  IV Narcan and 2mg  intranasal.  On arrival, the patient was GCS 13, ABC intact, acutely intoxicated and somnolent, decreased respiratory rate with pinpoint pupils concerning for opiate overdose. CBG with EMS was normal. After IV Narcan 2mg , the patient was more alert and acknowledged drinking alcohol today. He is not sure of any other substance use.   EKG on arrival status post Narcan revealed a prolonged Qtc at  584, no acute ischemic changes. CBG on arrival 149. ETOH level 181.  Chest x-ray performed which was reviewed by myself and radiology which revealed no acute abnormality, no evidence of aspiration.  Patient was placed on cardiac telemetry for continuous close monitoring. He was placed on O2 2L via nasal cannula and end tidal CO2.  The patient did become apneic and did require additional 2 mg of IV Narcan.  He was subsequently started on a Narcan gtt.  He remained stable following administration of Narcan GTT at 1.5 mg/h.  Dr. Vassie Loll of pulmonary critical care was consulted for admission and ultimately accepted the patient in admission for further care and monitoring of a likely opiate overdose. Spoke with NP Joneen Roach who will come from [facility] to evaluate the patient.   Final Clinical Impression(s) / ED Diagnoses Final diagnoses:  Accidental overdose, initial encounter  Opiate overdose, accidental or unintentional, initial encounter (HCC)  Elevated blood alcohol level, blood alcohol level not specified    Rx / DC Orders ED Discharge Orders     None         Ernie Avena, MD 04/29/21 2316    Ernie Avena, MD 04/30/21 0130

## 2021-04-29 NOTE — H&P (Signed)
NAME:  Brett House, MRN:  [id number], DOB:  12/02/1985, LOS: 0 ADMISSION DATE:  04/29/2021, CONSULTATION DATE:  2/27 REFERRING MD:  Dr. Karene Fry EDP, CHIEF COMPLAINT:  overdose   History of Present Illness:  36 year old male with extensive mental health history, which includes bipolar disorder, substance abuse, and multiple prior suicide attempts. He has intentionally overdosed on percocet in the past. History of cocaine, alcohol, THC as well.  Recently seen in the [facility] ED with suicidal ideation with plan (hanging). Presented to [facility] 2/27 via EMS after being found down in the middle of the road. Given narcan en route with good response. Upon arrival to ED GCS was 13. He ultimately became less responsive, then apneic requiring additional narcan. He was started on an infusion. PCCM asked to admit. Upon my evaluation he does not know what happened. He admits to using heroin nasally as well as alcohol. Denies other substances. Denies suicidal ideation. No other complaints.   Pertinent  Medical History   has a past medical history of Anxiety, Asthma, Bipolar disorder (HCC), and Depression.   Significant Hospital Events: Including procedures, antibiotic start and stop dates in addition to other pertinent events     Interim History / Subjective:    Objective   Blood pressure 107/63, pulse (!) 58, temperature (!) 97.4 F (36.3 C), temperature source Oral, resp. rate 18, height 5\' 10"  (1.778 m), weight 75 kg, SpO2 98 %.       No intake or output data in the 24 hours ending 04/30/21 0116 Filed Weights   04/29/21 1749  Weight: 75 kg    Examination: General: Thin adult male in NAD HENT: [place]/AT, PERRL, no JVD Lungs: Clear bilateral breath sounds.  Cardiovascular: RRR, no MRG Abdomen: Soft, non-distended Extremities: No acute deformity or ROM limitation Neuro: Somnolent, but easily arouses to cooperate with exam. Non-focal.    Resolved Hospital Problem list     Assessment &  Plan:   Opiate overdose, accidental: reports heroin use. UDS only positive for amphetamines, which are not a home med Not sure if it was laced. Uses occasionally. Has injected in the distant past, but now only snorts.  - Admit to ICU for telemetry monitoring - Continue narcan infusion for now, with hopes to discontinue in the morning.  - Cessation counseling offered  Bipolar disorder - Holding home fluoxetine, risperidone, trazodone, hydroxyzine for now  Asthma without exacerbation - PRN albuterol   Hypocalcemia:  7.5 corrects to 8.1 for albumin.  - oral calcium supplementation  Best Practice (right click and "Reselect all SmartList Selections" daily)   Diet/type: NPO DVT prophylaxis: systemic heparin GI prophylaxis: PPI Lines: N/A Foley:  N/A Code Status:  full code Last date of multidisciplinary goals of care discussion [ ]   Labs   CBC: Recent Labs  Lab 04/29/21 2355  WBC 8.4  HGB 15.5  HCT 45.7  MCV 93.6  PLT 286    Basic Metabolic Panel: Recent Labs  Lab 04/29/21 2355  NA 138  K 3.5  CL 106  CO2 25  GLUCOSE 126*  BUN 6  CREATININE 0.51*  CALCIUM 7.5*  MG 2.1  PHOS 3.5   GFR: Estimated Creatinine Clearance: 131.8 mL/min (A) (by C-G formula based on SCr of 0.51 mg/dL (L)). Recent Labs  Lab 04/29/21 2355  WBC 8.4    Liver Function Tests: Recent Labs  Lab 04/29/21 2355  AST 15  ALT 14  ALKPHOS 54  BILITOT 0.2*  PROT 5.4*  ALBUMIN 3.2*   No results for input(s): LIPASE, AMYLASE in the last 168 hours. No results for input(s): AMMONIA in the last 168 hours.  ABG    Component Value Date/Time   TCO2 26 09/23/2020 0754     Coagulation Profile: No results for input(s): INR, PROTIME in the last 168 hours.  Cardiac Enzymes: No results for input(s): CKTOTAL, CKMB, CKMBINDEX, TROPONINI in the last 168 hours.  HbA1C: Hgb A1c MFr Bld  Date/Time Value Ref Range Status  09/27/2020 06:55 PM 5.2 4.8 - 5.6 % Final    Comment:    (NOTE)          Prediabetes: 5.7 - 6.4         Diabetes: >6.4         Glycemic control for adults with diabetes: <7.0   01/24/2020 11:22 AM 5.2 4.8 - 5.6 % Final    Comment:    (NOTE) Pre diabetes:          5.7%-6.4%  Diabetes:              >6.4%  Glycemic control for   <7.0% adults with diabetes     CBG: Recent Labs  Lab 04/29/21 1756  GLUCAP 149*    Review of Systems:    Bolds are positive  Constitutional: weight loss, gain, night sweats, Fevers, chills, fatigue .  HEENT: headaches, Sore throat, sneezing, nasal congestion, post nasal drip, Difficulty swallowing, Tooth/dental problems, visual complaints visual changes, ear ache CV:  chest pain, radiates:,Orthopnea, PND, swelling in lower extremities, dizziness, palpitations, syncope.  GI  heartburn, indigestion, abdominal pain, nausea, vomiting, diarrhea, change in bowel habits, loss of appetite, bloody stools.  Resp: cough, productive: , hemoptysis, dyspnea, chest pain, pleuritic.  Skin: rash or itching or icterus GU: dysuria, change in color of urine, urgency or frequency. flank pain, hematuria  MS: joint pain or swelling. decreased range of motion  Psych: change in mood or affect. depression or anxiety.  Neuro: difficulty with speech, weakness, numbness, ataxia    Past Medical History:  He,  has a past medical history of Anxiety, Asthma, Bipolar disorder ([place]), and Depression.   Surgical History:  History reviewed. No pertinent surgical history.   Social History:   reports that he has been smoking cigarettes. He has a 22.50 pack-year smoking history. He uses smokeless tobacco. He reports current alcohol use. He reports current drug use. Drug: Marijuana.   Family History:  His family history is not on file.   Allergies Allergies  Allergen Reactions   Erythromycin Other (See Comments)    Unknown childhood allergic reaction     Home Medications  Prior to Admission medications   Medication Sig Start Date End Date Taking?  Authorizing Provider  albuterol (VENTOLIN HFA) 108 (90 Base) MCG/ACT inhaler Inhale 1-2 puffs into the lungs every 4 (four) hours as needed for wheezing or shortness of breath. 04/08/21 05/08/21 Yes Ival Bible, MD  FLUoxetine (PROZAC) 40 MG capsule Take 1 capsule (40 mg total) by mouth daily. 04/08/21  Yes Ival Bible, MD  hydrOXYzine (ATARAX) 25 MG tablet Take 1 tablet (25 mg total) by mouth every 6 (six) hours as needed for anxiety. 04/08/21  Yes Ival Bible, MD  nicotine (NICODERM CQ - DOSED IN MG/24 HOURS) 21 mg/24hr patch Place 1 patch (21 mg total) onto the skin daily. 04/09/21  Yes Ival Bible, MD  risperiDONE (RISPERDAL) 2 MG tablet Take 1 tablet (2 mg total) by mouth at bedtime. 04/08/21  Yes Ival Bible, MD  traZODone (DESYREL) 50 MG tablet Take 1 tablet (50 mg total) by mouth at bedtime as needed for sleep. 04/08/21  Yes Ival Bible, MD       Georgann Housekeeper, AGACNP-BC [facility]  See Amion for personal pager PCCM on call pager [phone] until 7pm. Please call Elink 7p-7a. [phone]  04/30/2021 1:17 AM

## 2021-04-30 DIAGNOSIS — T40601A Poisoning by unspecified narcotics, accidental (unintentional), initial encounter: Secondary | ICD-10-CM

## 2021-04-30 LAB — CBC
HCT: 45.7 % (ref 39.0–52.0)
HCT: 47.8 % (ref 39.0–52.0)
Hemoglobin: 15.5 g/dL (ref 13.0–17.0)
Hemoglobin: 15.8 g/dL (ref 13.0–17.0)
MCH: 31.2 pg (ref 26.0–34.0)
MCH: 31.8 pg (ref 26.0–34.0)
MCHC: 33.1 g/dL (ref 30.0–36.0)
MCHC: 33.9 g/dL (ref 30.0–36.0)
MCV: 93.6 fL (ref 80.0–100.0)
MCV: 94.5 fL (ref 80.0–100.0)
Platelets: 286 10*3/uL (ref 150–400)
Platelets: 304 10*3/uL (ref 150–400)
RBC: 4.88 MIL/uL (ref 4.22–5.81)
RBC: 5.06 MIL/uL (ref 4.22–5.81)
RDW: 13.8 % (ref 11.5–15.5)
RDW: 13.9 % (ref 11.5–15.5)
WBC: 8.4 10*3/uL (ref 4.0–10.5)
WBC: 8.6 10*3/uL (ref 4.0–10.5)
nRBC: 0 % (ref 0.0–0.2)
nRBC: 0 % (ref 0.0–0.2)

## 2021-04-30 LAB — COMPREHENSIVE METABOLIC PANEL
ALT: 14 U/L (ref 0–44)
AST: 15 U/L (ref 15–41)
Albumin: 3.2 g/dL — ABNORMAL LOW (ref 3.5–5.0)
Alkaline Phosphatase: 54 U/L (ref 38–126)
Anion gap: 7 (ref 5–15)
BUN: 6 mg/dL (ref 6–20)
CO2: 25 mmol/L (ref 22–32)
Calcium: 7.5 mg/dL — ABNORMAL LOW (ref 8.9–10.3)
Chloride: 106 mmol/L (ref 98–111)
Creatinine, Ser: 0.51 mg/dL — ABNORMAL LOW (ref 0.61–1.24)
GFR, Estimated: >60 mL/min (ref >60)
Glucose, Bld: 126 mg/dL — ABNORMAL HIGH (ref 70–99)
Potassium: 3.5 mmol/L (ref 3.5–5.1)
Sodium: 138 mmol/L (ref 135–145)
Total Bilirubin: 0.2 mg/dL — ABNORMAL LOW (ref 0.3–1.2)
Total Protein: 5.4 g/dL — ABNORMAL LOW (ref 6.5–8.1)

## 2021-04-30 LAB — MRSA NEXT GEN BY PCR, NASAL: MRSA by PCR Next Gen: NOT DETECTED

## 2021-04-30 LAB — BASIC METABOLIC PANEL
Anion gap: 5 (ref 5–15)
BUN: 5 mg/dL — ABNORMAL LOW (ref 6–20)
CO2: 28 mmol/L (ref 22–32)
Calcium: 7.9 mg/dL — ABNORMAL LOW (ref 8.9–10.3)
Chloride: 105 mmol/L (ref 98–111)
Creatinine, Ser: 0.62 mg/dL (ref 0.61–1.24)
GFR, Estimated: >60 mL/min (ref >60)
Glucose, Bld: 87 mg/dL (ref 70–99)
Potassium: 3.6 mmol/L (ref 3.5–5.1)
Sodium: 138 mmol/L (ref 135–145)

## 2021-04-30 LAB — MAGNESIUM
Magnesium: 2.1 mg/dL (ref 1.7–2.4)
Magnesium: 2.2 mg/dL (ref 1.7–2.4)

## 2021-04-30 LAB — PHOSPHORUS
Phosphorus: 3.3 mg/dL (ref 2.5–4.6)
Phosphorus: 3.5 mg/dL (ref 2.5–4.6)

## 2021-04-30 LAB — RAPID URINE DRUG SCREEN, HOSP PERFORMED
Amphetamines: POSITIVE — AB
Barbiturates: NOT DETECTED
Benzodiazepines: NOT DETECTED
Cocaine: NOT DETECTED
Opiates: NOT DETECTED
Tetrahydrocannabinol: NOT DETECTED

## 2021-04-30 MED ORDER — LORAZEPAM 1 MG PO TABS
1.0000 mg | ORAL_TABLET | ORAL | Status: DC | PRN
  Administered 2021-05-01 (×3): 2 mg via ORAL
  Administered 2021-05-01: 4 mg via ORAL
  Filled 2021-04-30: qty 4
  Filled 2021-04-30 (×3): qty 2

## 2021-04-30 MED ORDER — HYDROXYZINE HCL 25 MG PO TABS
25.0000 mg | ORAL_TABLET | Freq: Four times a day (QID) | ORAL | Status: DC | PRN
  Administered 2021-04-30 – 2021-05-01 (×2): 25 mg via ORAL
  Filled 2021-04-30 (×3): qty 1

## 2021-04-30 MED ORDER — CHLORHEXIDINE GLUCONATE CLOTH 2 % EX PADS
6.0000 | MEDICATED_PAD | Freq: Every day | CUTANEOUS | Status: DC
  Administered 2021-04-30 – 2021-05-04 (×5): 6 via TOPICAL

## 2021-04-30 MED ORDER — CALCIUM CARBONATE ANTACID 500 MG PO CHEW
400.0000 mg | CHEWABLE_TABLET | Freq: Once | ORAL | Status: AC
  Administered 2021-04-30: 400 mg via ORAL
  Filled 2021-04-30: qty 2

## 2021-04-30 MED ORDER — HEPARIN SODIUM (PORCINE) 5000 UNIT/ML IJ SOLN
5000.0000 [IU] | Freq: Three times a day (TID) | INTRAMUSCULAR | Status: DC
  Administered 2021-04-30 – 2021-05-05 (×14): 5000 [IU] via SUBCUTANEOUS
  Filled 2021-04-30 (×17): qty 1

## 2021-04-30 MED ORDER — ALBUTEROL SULFATE HFA 108 (90 BASE) MCG/ACT IN AERS: 1.0000 | INHALATION_SPRAY | RESPIRATORY_TRACT | Status: DC | PRN

## 2021-04-30 MED ORDER — PANTOPRAZOLE SODIUM 40 MG PO TBEC
40.0000 mg | DELAYED_RELEASE_TABLET | Freq: Every day | ORAL | Status: DC
  Administered 2021-04-30 – 2021-05-04 (×5): 40 mg via ORAL
  Filled 2021-04-30 (×5): qty 1

## 2021-04-30 MED ORDER — THIAMINE HCL 100 MG/ML IJ SOLN: 100.0000 mg | Freq: Every day | INTRAMUSCULAR | Status: DC

## 2021-04-30 MED ORDER — THIAMINE HCL 100 MG PO TABS
100.0000 mg | ORAL_TABLET | Freq: Every day | ORAL | Status: DC
  Administered 2021-04-30 – 2021-05-04 (×5): 100 mg via ORAL
  Filled 2021-04-30 (×5): qty 1

## 2021-04-30 MED ORDER — ADULT MULTIVITAMIN W/MINERALS CH
1.0000 | ORAL_TABLET | Freq: Every day | ORAL | Status: DC
  Administered 2021-04-30 – 2021-05-04 (×5): 1 via ORAL
  Filled 2021-04-30 (×5): qty 1

## 2021-04-30 MED ORDER — FOLIC ACID 1 MG PO TABS
1.0000 mg | ORAL_TABLET | Freq: Every day | ORAL | Status: DC
  Administered 2021-04-30 – 2021-05-04 (×5): 1 mg via ORAL
  Filled 2021-04-30 (×5): qty 1

## 2021-04-30 MED ORDER — LORAZEPAM 2 MG/ML IJ SOLN
1.0000 mg | INTRAMUSCULAR | Status: DC | PRN
  Administered 2021-04-30: 2 mg via INTRAVENOUS
  Administered 2021-04-30: 1 mg via INTRAVENOUS
  Administered 2021-04-30: 2 mg via INTRAVENOUS
  Filled 2021-04-30 (×3): qty 1

## 2021-04-30 NOTE — Progress Notes (Signed)
36 yo m w/ bipolar disorder, polysubstance abuse and prior SI admitted after being found down. He required multiple boluses of narcan and then  Narcan gtt. Used  heroin PTA, possible fentanyl contamination- not an intentional overdose- used  Intranasal heroin and Etoh recently.   Plans: -Admitted to ICU for closer  monitoring -Continue narcan infusion -hold other psychotropics for now, including fluoxetine, risperidone, trazodone  -correct electrolytes, Mg

## 2021-04-30 NOTE — Progress Notes (Signed)
Was the fall witnessed: No  Patient condition before and after the fall: Stable   Patient's reaction to the fall:Agitated   Name of the doctor that was notified including date and time: [place], CCM   Any interventions and vital signs: WNL

## 2021-04-30 NOTE — Progress Notes (Signed)
NAME:  Brett House, MRN:  [id number], DOB:  1985/06/08, LOS: 0 ADMISSION DATE:  04/29/2021, CONSULTATION DATE:  2/27 REFERRING MD:  Dr. Karene Fry - EDP CHIEF COMPLAINT: Overdose   History of Present Illness:  36 year old man who presented to [facility] ED 2/27 via EMS with concern for overdose after being found down. Given Narcan en route with good response. PMHx significant for extensive mental health history including bipolar disorder, polysubstance abuse (EtOH, cocaine, THC, intranasal heroin) and multiple prior suicide attempts. He has intentionally overdosed on percocet in the past. Recently seen at [facility] ED for SI with a plan (hanging).  On arrival to ED, GCS 13 but patient ultimately became less responsive, then apneic requiring additional Narcan administration. He was started on a Narcan infusion. Upon PCCM evaluation he does not know what happened. He admits to using heroin nasally as well as daily alcohol use. Denies other substances. Denies suicidal ideation. No other complaints on admission.  PCCM asked to admit. Pertinent Medical History:   Past Medical History:  Diagnosis Date   Anxiety    Asthma    Bipolar disorder (HCC)    Depression    Significant Hospital Events: Including procedures, antibiotic start and stop dates in addition to other pertinent events   2/27 - Presented to [facility] ED via EMS after found down. Suspected overdose. Reports intranasal heroin use (may have been laced/tainted) and daily EtOH. Required Narcan en route + additional in ED, eventually required Narcan gtt. UDS+ amphetamines.   Interim History / Subjective:  Admitted overnight for suspected overdose requiring Narcan gtt Feels tired/fatigued this morning, nose is sore and feels bruised No other significant complaints Reports that the last thing he remembers is being at [facility], does not remember anything past this point Denies fever, chills, CP/SOB, nausea vomiting or abdominal pain Denies SI at this time, no  auditory or visual hallucinations Had been clean from heroin for nearly 10 years prior to the last 2 months He has withdrawn from alcohol in the past (last 2017)  Objective:  Blood pressure (!) 112/50, pulse (!) 49, temperature 98 F (36.7 C), temperature source Oral, resp. rate 12, height 5\' 10"  (1.778 m), weight 75.5 kg, SpO2 97 %.        Intake/Output Summary (Last 24 hours) at 04/30/2021 0738 Last data filed at 04/30/2021 0600 Gross per 24 hour  Intake 822.4 ml  Output 750 ml  Net 72.4 ml   Filed Weights   04/29/21 1749 04/30/21 0500  Weight: 75 kg 75.5 kg   Physical Examination: General: Acutely ill-appearing young man in NAD. Appears fatigued. HEENT: [place]/AT, anicteric sclera, PERRL, moist mucous membranes.  Neuro: Awake, oriented x 4. Appears fatigued but answers questions appropriately. Responds to verbal stimuli. Following commands consistently. Moves all 4 extremities spontaneously. CV: Mild bradycardic, regular rhythm, no m/g/r. PULM: Breathing even and unlabored on 2LNC. CO2 monitor in place. Lung fields CTAB. GI: Soft, nontender, nondistended. Normoactive bowel sounds. Extremities: No LE edema noted. Skin: Warm/dry, no rashes.  Resolved Hospital Problem List:     Assessment & Plan:   Opiate overdose, accidental Reports intranasal heroin use. UDS only positive for amphetamines, which are not a home medication. Not sure if it was laced. Uses occasionally. Has injected in the distant past, but now only snorts (previously clean since 2012). - Mental status and respiratory status significantly improved on Narcan gtt - Rapid wean of Narcan gtt - We will monitor over the next few hours, if his mental status and respiratory  status remain acceptable we will likely discharge patient this afternoon - Patient is unfortunately not interested in cessation/detox/rehabilitation resources at this time  At risk for EtOH withdrawal EtOH abuse - Monitor for signs/symptoms of  withdrawal - CIWA protocol - Continue thiamine - MV when able to tolerate PO  Bipolar disorder - Holding home fluoxetine, risperidone, trazodone, hydroxyzine for now - Resume as clinically appropriate  Asthma without exacerbation - Supplemental O2 as needed to maintain O2 sat > 90% - Albuterol PRN - Pulmonary hygiene, mobilization  Hypocalcemia:  7.5 corrects to 8.1 for albumin.  - PO Calcium supplementation - MVI  Best Practice (right click and "Reselect all SmartList Selections" daily)   Diet/type: Regular consistency (see orders) DVT prophylaxis: SCDs, SQH GI prophylaxis: PPI Lines: N/A Foley:  N/A Code Status:  full code Last date of multidisciplinary goals of care discussion [Remains full code 2/28AM]  Critical care time: N/A   [facility] [facility] 04/30/21 8:34 AM  Please see [email or web address] for pager details.  From 7A-7P if no response, please call [phone] After hours, please call ELink [phone]

## 2021-05-01 DIAGNOSIS — F1510 Other stimulant abuse, uncomplicated: Secondary | ICD-10-CM

## 2021-05-01 DIAGNOSIS — F319 Bipolar disorder, unspecified: Secondary | ICD-10-CM

## 2021-05-01 DIAGNOSIS — F1910 Other psychoactive substance abuse, uncomplicated: Secondary | ICD-10-CM

## 2021-05-01 DIAGNOSIS — F1410 Cocaine abuse, uncomplicated: Secondary | ICD-10-CM

## 2021-05-01 DIAGNOSIS — Z720 Tobacco use: Secondary | ICD-10-CM

## 2021-05-01 DIAGNOSIS — F10932 Alcohol use, unspecified with withdrawal with perceptual disturbance: Secondary | ICD-10-CM

## 2021-05-01 LAB — COMPREHENSIVE METABOLIC PANEL
ALT: 14 U/L (ref 0–44)
AST: 15 U/L (ref 15–41)
Albumin: 3.1 g/dL — ABNORMAL LOW (ref 3.5–5.0)
Alkaline Phosphatase: 59 U/L (ref 38–126)
Anion gap: 4 — ABNORMAL LOW (ref 5–15)
BUN: 7 mg/dL (ref 6–20)
CO2: 30 mmol/L (ref 22–32)
Calcium: 8.1 mg/dL — ABNORMAL LOW (ref 8.9–10.3)
Chloride: 103 mmol/L (ref 98–111)
Creatinine, Ser: 0.74 mg/dL (ref 0.61–1.24)
GFR, Estimated: >60 mL/min (ref >60)
Glucose, Bld: 93 mg/dL (ref 70–99)
Potassium: 3.9 mmol/L (ref 3.5–5.1)
Sodium: 137 mmol/L (ref 135–145)
Total Bilirubin: 0.4 mg/dL (ref 0.3–1.2)
Total Protein: 5.2 g/dL — ABNORMAL LOW (ref 6.5–8.1)

## 2021-05-01 LAB — CBC WITH DIFFERENTIAL/PLATELET
Abs Immature Granulocytes: 0.05 10*3/uL (ref 0.00–0.07)
Basophils Absolute: 0 10*3/uL (ref 0.0–0.1)
Basophils Relative: 1 %
Eosinophils Absolute: 0.2 10*3/uL (ref 0.0–0.5)
Eosinophils Relative: 3 %
HCT: 50.3 % (ref 39.0–52.0)
Hemoglobin: 16.4 g/dL (ref 13.0–17.0)
Immature Granulocytes: 1 %
Lymphocytes Relative: 29 %
Lymphs Abs: 2 10*3/uL (ref 0.7–4.0)
MCH: 31.2 pg (ref 26.0–34.0)
MCHC: 32.6 g/dL (ref 30.0–36.0)
MCV: 95.6 fL (ref 80.0–100.0)
Monocytes Absolute: 0.7 10*3/uL (ref 0.1–1.0)
Monocytes Relative: 10 %
Neutro Abs: 4 10*3/uL (ref 1.7–7.7)
Neutrophils Relative %: 56 %
Platelets: 273 10*3/uL (ref 150–400)
RBC: 5.26 MIL/uL (ref 4.22–5.81)
RDW: 13.6 % (ref 11.5–15.5)
WBC: 6.9 10*3/uL (ref 4.0–10.5)
nRBC: 0 % (ref 0.0–0.2)

## 2021-05-01 LAB — MAGNESIUM: Magnesium: 2.2 mg/dL (ref 1.7–2.4)

## 2021-05-01 MED ORDER — NICOTINE 21 MG/24HR TD PT24
21.0000 mg | MEDICATED_PATCH | Freq: Every day | TRANSDERMAL | Status: DC
Start: 2021-05-01 — End: 2021-05-05
  Administered 2021-05-01 – 2021-05-04 (×4): 21 mg via TRANSDERMAL
  Filled 2021-05-01 (×4): qty 1

## 2021-05-01 MED ORDER — ENSURE ENLIVE PO LIQD
237.0000 mL | ORAL | Status: DC
  Administered 2021-05-02 – 2021-05-04 (×3): 237 mL via ORAL

## 2021-05-01 MED ORDER — DEXMEDETOMIDINE HCL IN NACL 200 MCG/50ML IV SOLN
INTRAVENOUS | Status: AC
  Administered 2021-05-01: 0.4 ug/kg/h via INTRAVENOUS
  Filled 2021-05-01: qty 50

## 2021-05-01 MED ORDER — LORAZEPAM 2 MG/ML IJ SOLN
1.0000 mg | INTRAMUSCULAR | Status: AC | PRN
  Administered 2021-05-01: 2 mg via INTRAVENOUS
  Administered 2021-05-02 (×2): 4 mg via INTRAVENOUS
  Filled 2021-05-01 (×2): qty 2
  Filled 2021-05-01: qty 1

## 2021-05-01 MED ORDER — LORAZEPAM 2 MG/ML IJ SOLN
0.0000 mg | INTRAMUSCULAR | Status: AC
  Administered 2021-05-01 (×2): 2 mg via INTRAVENOUS
  Administered 2021-05-02: 4 mg via INTRAVENOUS
  Administered 2021-05-02 – 2021-05-03 (×3): 2 mg via INTRAVENOUS
  Filled 2021-05-01 (×4): qty 1
  Filled 2021-05-01: qty 2
  Filled 2021-05-01: qty 1
  Filled 2021-05-01: qty 2

## 2021-05-01 MED ORDER — LORAZEPAM 1 MG PO TABS
1.0000 mg | ORAL_TABLET | ORAL | Status: AC | PRN
  Administered 2021-05-02 – 2021-05-03 (×3): 2 mg via ORAL
  Filled 2021-05-01 (×3): qty 2

## 2021-05-01 MED ORDER — BOOST / RESOURCE BREEZE PO LIQD CUSTOM
1.0000 | ORAL | Status: DC
  Administered 2021-05-01 – 2021-05-03 (×2): 1 via ORAL

## 2021-05-01 MED ORDER — PHENOBARBITAL SODIUM 65 MG/ML IJ SOLN
65.0000 mg | Freq: Three times a day (TID) | INTRAMUSCULAR | Status: DC
  Administered 2021-05-01 – 2021-05-03 (×7): 65 mg via INTRAVENOUS
  Filled 2021-05-01 (×8): qty 1

## 2021-05-01 MED ORDER — DEXMEDETOMIDINE HCL IN NACL 400 MCG/100ML IV SOLN
0.0000 ug/kg/h | INTRAVENOUS | Status: DC
  Administered 2021-05-01 – 2021-05-02 (×3): 0.7 ug/kg/h via INTRAVENOUS
  Administered 2021-05-02: 0.4 ug/kg/h via INTRAVENOUS
  Administered 2021-05-03: 0.5 ug/kg/h via INTRAVENOUS
  Filled 2021-05-01 (×5): qty 100

## 2021-05-01 MED ORDER — TRAZODONE HCL 50 MG PO TABS: 50.0000 mg | ORAL_TABLET | Freq: Every evening | ORAL | Status: DC | PRN

## 2021-05-01 MED ORDER — RISPERIDONE 1 MG PO TABS
2.0000 mg | ORAL_TABLET | Freq: Every day | ORAL | Status: DC
  Administered 2021-05-02 – 2021-05-04 (×3): 2 mg via ORAL
  Filled 2021-05-01 (×3): qty 2

## 2021-05-01 MED ORDER — LORAZEPAM 2 MG/ML IJ SOLN
0.0000 mg | Freq: Three times a day (TID) | INTRAMUSCULAR | Status: DC
  Administered 2021-05-03: 2 mg via INTRAVENOUS
  Administered 2021-05-03: 1 mg via INTRAVENOUS
  Filled 2021-05-01 (×2): qty 1

## 2021-05-01 NOTE — TOC Initial Note (Addendum)
Transition of Care (TOC) - Initial/Assessment Note  ? ?Patient Details  ?Name: Brett House ?MRN: [id number] ?Date of Birth: Sep 30, 1985 ? ?Transition of Care (TOC) CM/SW Contact:    ?Ewing Schlein, LCSW ?Phone Number: ?05/01/2021, 11:40 AM ? ?Clinical Narrative: TOC consulted for substance use resources, shelter resources, and medication assistance. CSW spoke with patient regarding consults. CSW explained that medication assistance would be assessed closer to discharge as the [facility] voucher will only cover certain medications and is only good for 7 days after it is issued. Patient aware this voucher, if approved, will not cover OTC or pain medications. Substance use and shelter resources added to AVS. ? ?Addendum: CSW notified by hospitalist that patient will need to be IVC'd. CSW completed IVC paperwork and faxed it to the [place] office. CSW confirmed it was received and paperwork was faxed back. IVC packet placed on patient's chart. CSW updated RN and hospitalist. ? ?Expected Discharge Plan: Home/Self Care ?Barriers to Discharge: Inadequate or no insurance, Continued Medical Work up [employer]) ? ?Patient Goals and CMS Choice ?Choice offered to / list presented to : NA ? ?Expected Discharge Plan and Services ?Expected Discharge Plan: Home/Self Care ?In-house Referral: Clinical Social Work ?Post Acute Care Choice: NA ?Living arrangements for the past 2 months: Homeless              ?DME Arranged: N/A ?DME Agency: NA ? ?Prior Living Arrangements/Services ?Living arrangements for the past 2 months: Homeless ?Patient language and need for interpreter reviewed:: Yes ?Need for Family Participation in Patient Care: No (Comment) ?Care giver support system in place?: Yes (comment) ?Criminal Activity/Legal Involvement Pertinent to Current Situation/Hospitalization: No - Comment as needed ? ?Activities of Daily Living ?Home Assistive Devices/Equipment: Eyeglasses ?ADL Screening (condition at time of admission) ?Patient's  cognitive ability adequate to safely complete daily activities?: No ?Is the patient deaf or have difficulty hearing?: No ?Does the patient have difficulty seeing, even when wearing glasses/contacts?: Yes ?Does the patient have difficulty concentrating, remembering, or making decisions?: Yes ?Patient able to express need for assistance with ADLs?: Yes ?Does the patient have difficulty dressing or bathing?: Yes ?Independently performs ADLs?: Yes (appropriate for developmental age) ?Does the patient have difficulty walking or climbing stairs?: No ?Weakness of Legs: Both ?Weakness of Arms/Hands: Both ? ?Emotional Assessment ?Attitude/Demeanor/Rapport: Lethargic ?Affect (typically observed): Accepting ?Orientation: : Oriented to Self, Oriented to Place, Oriented to  Time, Oriented to Situation ?Alcohol / Substance Use: Tobacco Use (Opioids) ? ?Admission diagnosis:  Opioid overdose (HCC) [T40.2X1A] ?Accidental overdose, initial encounter [T50.901A] ?Opiate overdose, accidental or unintentional, initial encounter (HCC) [T40.601A] ?Elevated blood alcohol level, blood alcohol level not specified [R78.0] ?Patient Active Problem List  ? Diagnosis Date Noted  ? Opiate overdose (HCC) 04/30/2021  ? Polysubstance abuse (HCC) 04/06/2021  ? Bipolar 1 disorder, depressed (HCC) 04/05/2021  ? Bipolar 1 disorder (HCC) 11/14/2020  ? Bipolar 1 disorder, depressed, severe (HCC) 10/12/2020  ? Suicidal thoughts   ? Substance induced mood disorder (HCC) 10/07/2020  ? 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 Bipolar disorder (HCC) 09/25/2020  ? Brief psychotic disorder (HCC)   ? Suicidal ideation   ? Cocaine use disorder, mild, abuse (HCC) 01/27/2020  ? Marijuana abuse 01/27/2020  ? Bipolar I disorder, current episode depressed (HCC) 01/25/2020  ? ?PCP:  Pcp, No ?Pharmacy:   ?[id number] - [place], [place] - [address] ?[address] ?[place] [address] ?Phone: [phone] Fax: [phone] ? ?[id number] -  [place], [place] - [phone] ?[address] ? [place] [address] ?Phone: [phone] Fax: [phone] ? ?Readmission Risk Interventions ?Readmission Risk Prevention Plan 05/01/2021  ?Medication Review Oceanographer) Complete  ?SW Recovery Care/Counseling Consult Complete  ?Palliative Care Screening Not Applicable  ?Skilled Nursing Facility Not Applicable  ? ?

## 2021-05-01 NOTE — Progress Notes (Signed)
PROGRESS NOTE    Brett House  ZOX:[id number] DOB: 1985-10-23 DOA: 04/29/2021 PCP: Oneita Hurt, No   Brief Narrative: Brett House is a 36 y.o. male with a history of bipolar disorder, polysubstance abuse, depression, anxiety. Patient presented after being found down with concern for accidental opiate overdose. Patient was started on a Narcan IV drip with improvement in symptoms. While admitted, he initially declined detox, but subsequently changed his mind. Patient was started on CIWA protocol with worsening symptoms for alcohol withdrawal vs possible acute psychosis. Patient voiced desire to leave the hospital but shows signs of no capacity, so IVC started on 05/01/2021   Assessment and Plan: * Opiate overdose (HCC)-resolved as of 05/01/2021 Patient initially managed on Narcan IV drip with improvement of symptoms. Now resolved. Patient agreeable to receiving resources for outpatient detox.  Alcohol withdrawal hallucinosis (HCC) Patient with significantly high CIWA scores today. No significant tachycardia/tachypnea. Normotensive. Currently with hallucinations. No capcity on evaluation this afternoon. -Continue CIWA -Discussed with PCCM with recommendations for phenobarbital 65 mg TID  Polysubstance abuse (HCC)- (present on admission) Patient voiced interest in outpatient detox. Will consult TOC for resources once patient is closer to discharge readiness.  Tobacco use -Nicotine patch  Bipolar 1 disorder (HCC)- (present on admission) Unsure if acute presentation today is related to bipolar diagnosis. Patient is on Risperdal as an outpatient which was held on admission. -May need to consult psychiatry eventually this admission pending medical management of possible alcohol withdrawal  Amphetamine abuse (HCC)- (present on admission) Noted positive result on UDS this admission. Does not seem related to current presentation.  Cocaine use disorder, mild, abuse (HCC)- (present on admission) History  of. UDS on this admission was negative for cocaine.    DVT prophylaxis: Heparin subq Code Status:   Code Status: Full Code Family Communication: None at bedside Disposition Plan: Discharge home likely in 2-5 days pending improvement of alcohol withdrawal symptoms/psychosis   Consultants:  PCCM  Procedures:  None  Antimicrobials: None    Subjective: Patient this morning without concerns. This afternoon, patient reports wanting to leave the hospital because he has things to do.   Objective: BP 124/72 (BP Location: Left Arm)   Pulse 82   Temp (!) 96.6 F (35.9 C) (Oral)   Resp 13   Ht 5\' 10"  (1.778 m)   Wt 75 kg   SpO2 97%   BMI 23.72 kg/m   Examination:  General exam: Appeared calm and comfortable this morning. Currently agitated. Respiratory system: Clear to auscultation. Respiratory effort normal. Cardiovascular system: S1 & S2 heard, RRR. No murmurs, rubs, gallops or clicks. Gastrointestinal system: Abdomen is nondistended, soft and nontender. No organomegaly or masses felt. Normal bowel sounds heard. Central nervous system: Alert and oriented to person. No focal neurological deficits. Musculoskeletal: No edema. No calf tenderness Skin: No cyanosis. No rashes Psychiatry: Judgement and insight impaired. Patient with poor memory. Evidence of visual hallucination. Agitated.   Data Reviewed: I have personally reviewed following labs and imaging studies  CBC Lab Results  Component Value Date   WBC 6.9 05/01/2021   RBC 5.26 05/01/2021   HGB 16.4 05/01/2021   HCT 50.3 05/01/2021   MCV 95.6 05/01/2021   MCH 31.2 05/01/2021   PLT 273 05/01/2021   MCHC 32.6 05/01/2021   RDW 13.6 05/01/2021   LYMPHSABS 2.0 05/01/2021   MONOABS 0.7 05/01/2021   EOSABS 0.2 05/01/2021   BASOSABS 0.0 05/01/2021     Last metabolic panel Lab Results  Component Value Date  NA 137 05/01/2021   K 3.9 05/01/2021   CL 103 05/01/2021   CO2 30 05/01/2021   BUN 7 05/01/2021    CREATININE 0.74 05/01/2021   GLUCOSE 93 05/01/2021   GFRNONAA >60 05/01/2021   GFRAA >60 10/05/2019   CALCIUM 8.1 (L) 05/01/2021   PHOS 3.3 04/30/2021   PROT 5.2 (L) 05/01/2021   ALBUMIN 3.1 (L) 05/01/2021   BILITOT 0.4 05/01/2021   ALKPHOS 59 05/01/2021   AST 15 05/01/2021   ALT 14 05/01/2021   ANIONGAP 4 (L) 05/01/2021    GFR: Estimated Creatinine Clearance: 131.8 mL/min (by C-G formula based on SCr of 0.74 mg/dL).  Recent Results (from the past 240 hour(s))  Resp Panel by RT-PCR (Flu A&B, Covid) Nasopharyngeal Swab     Status: None   Collection Time: 04/29/21  6:04 PM   Specimen: Nasopharyngeal Swab; Nasopharyngeal(NP) swabs in vial transport medium  Result Value Ref Range Status   SARS Coronavirus 2 by RT PCR NEGATIVE NEGATIVE Final    Comment: (NOTE) SARS-CoV-2 target nucleic acids are NOT DETECTED.  The SARS-CoV-2 RNA is generally detectable in upper respiratory specimens during the acute phase of infection. The lowest concentration of SARS-CoV-2 viral copies this assay can detect is 138 copies/mL. A negative result does not preclude SARS-Cov-2 infection and should not be used as the sole basis for treatment or other patient management decisions. A negative result may occur with  improper specimen collection/handling, submission of specimen other than nasopharyngeal swab, presence of viral mutation(s) within the areas targeted by this assay, and inadequate number of viral copies(<138 copies/mL). A negative result must be combined with clinical observations, patient history, and epidemiological information. The expected result is Negative.  Fact Sheet for Patients:  BloggerCourse.com  Fact Sheet for Healthcare Providers:  SeriousBroker.it  This test is no t yet approved or cleared by the [place] FDA and  has been authorized for detection and/or diagnosis of SARS-CoV-2 by FDA under an Emergency Use  Authorization (EUA). This EUA will remain  in effect (meaning this test can be used) for the duration of the COVID-19 declaration under Section 564(b)(1) of the Act, 21 U.S.C.section 360bbb-3(b)(1), unless the authorization is terminated  or revoked sooner.       Influenza A by PCR NEGATIVE NEGATIVE Final   Influenza B by PCR NEGATIVE NEGATIVE Final    Comment: (NOTE) The Xpert Xpress SARS-CoV-2/FLU/RSV plus assay is intended as an aid in the diagnosis of influenza from Nasopharyngeal swab specimens and should not be used as a sole basis for treatment. Nasal washings and aspirates are unacceptable for Xpert Xpress SARS-CoV-2/FLU/RSV testing.  Fact Sheet for Patients: BloggerCourse.com  Fact Sheet for Healthcare Providers: SeriousBroker.it  This test is not yet approved or cleared by the [place] FDA and has been authorized for detection and/or diagnosis of SARS-CoV-2 by FDA under an Emergency Use Authorization (EUA). This EUA will remain in effect (meaning this test can be used) for the duration of the COVID-19 declaration under Section 564(b)(1) of the Act, 21 U.S.C. section 360bbb-3(b)(1), unless the authorization is terminated or revoked.  Performed at [facility], [address], [place], [place] [address]   MRSA Next Gen by PCR, Nasal     Status: None   Collection Time: 04/30/21  2:58 AM   Specimen: Nasal Mucosa; Nasal Swab  Result Value Ref Range Status   MRSA by PCR Next Gen NOT DETECTED NOT DETECTED Final    Comment: (NOTE) The GeneXpert MRSA Assay (  FDA approved for NASAL specimens only), is one component of a comprehensive MRSA colonization surveillance program. It is not intended to diagnose MRSA infection nor to guide or monitor treatment for MRSA infections. Test performance is not FDA approved in patients less than 34 years old. Performed at [facility], [address], [place], [place] [address]       Radiology Studies: DG Chest Portable 1 View  Result Date: 04/29/2021 CLINICAL DATA:  Overdose EXAM: PORTABLE CHEST 1 VIEW COMPARISON:  Chest x-ray dated September 23, 2020 FINDINGS: Cardiac and mediastinal contours are within normal limits. Lungs are clear. No large pleural effusion or evidence of pneumothorax. IMPRESSION: No acute cardiopulmonary abnormality. Electronically Signed   By: Allegra Lai M.D.   On: 04/29/2021 18:23      LOS: 1 day    Jacquelin Hawking, MD [facility] 05/01/2021, 1:57 PM   If 7PM-7AM, please contact night-coverage [email or web address]

## 2021-05-01 NOTE — Assessment & Plan Note (Addendum)
Noted positive result on UDS this admission. Does not seem related to current presentation. ?

## 2021-05-01 NOTE — Assessment & Plan Note (Addendum)
Unsure if acute presentation today is related to bipolar diagnosis. Patient is on Risperdal as an outpatient which was held on admission. Unlikely need for psychiatry consult. ?

## 2021-05-01 NOTE — Assessment & Plan Note (Signed)
Patient voiced interest in outpatient detox. Will consult TOC for resources once patient is closer to discharge readiness. ?

## 2021-05-01 NOTE — Hospital Course (Addendum)
Brett House is a 36 y.o. male with a history of bipolar disorder, polysubstance abuse, depression, anxiety. Patient presented after being found down with concern for accidental opiate overdose. Patient was started on a Narcan IV drip with improvement in symptoms. While admitted, he initially declined detox, but subsequently changed his mind. Patient was started on CIWA protocol with worsening symptoms for alcohol withdrawal vs possible acute psychosis. Patient voiced desire to leave the hospital but shows signs of no capacity, so IVC'd on 05/01/2021. Patient was started on phenobarbital and Precedex with improved management of withdrawal symptoms. Precedex weaned off, followed by phenobarbital. IVC rescinded on 3/4. ?

## 2021-05-01 NOTE — Assessment & Plan Note (Addendum)
History of use. UDS on this admission was negative for cocaine. ?

## 2021-05-01 NOTE — Progress Notes (Signed)
Initial Nutrition Assessment ? ?DOCUMENTATION CODES:  ? ?Not applicable ? ?INTERVENTION:  ?- will order Boost Breeze once/day, each supplement provides 250 kcal and 9 grams of protein. ?- will order Ensure Plus High Protein once/day, each supplement provides 350 kcal and 20 grams of protein. ?- complete NFPE when feasible.  ? ? ?NUTRITION DIAGNOSIS:  ? ?Increased nutrient needs related to acute illness as evidenced by estimated needs. ? ?GOAL:  ? ?Patient will meet greater than or equal to 90% of their needs ? ?MONITOR:  ? ?PO intake, Supplement acceptance, Labs, Weight trends ? ?REASON FOR ASSESSMENT:  ? ?Malnutrition Screening Tool ? ?ASSESSMENT:  ? ?36 year old male with extensive mental health history which includes bipolar disorder, anxiety, depression, substance abuse (alcohol, cocaine, opiates, THC, and heroin nasally), and multiple prior suicide attempts. He also has asthma. He was recently seen in the ED due to [facility] with plan to hang himself. He presented to the ED on 2/27 via EMS after being found down in the middle of the road. Given Narcan en route with good response. In the ED he became less responsive and became apneic and was started on Narcan drip. ? ?Patient requesting for staff to disconnect him from tele and to help him to the bathroom at the time of first attempted visit. Patient preparing to walk in the hallway with Tech at the time of second attempted visit.  ? ?He ate 100% of breakfast and 80% of lunch yesterday and 100% of breakfast this AM.  ? ?Patient was assessed remotely by a RD on 10/15/20 while he was at [facility].  ? ?Note in the MST report indicates patient reported 20 lb weight loss since 03/11/21. Weight today is 165 lb and weight has been stable since 10/27/20. ? ?Patient is noted to be homeless.  ? ? ?Labs reviewed; Ca: 8.1 mg/dl. ?Medications reviewed; 1 mg folvite/day, 40 mg oral protonix/day, 100 mg oral thiamine/day. ?  ? ?NUTRITION - FOCUSED PHYSICAL EXAM: ? ?Unable at this time.   ? ?Diet Order:   ?Diet Order   ? ?       ?  Diet regular Room service appropriate? Yes; Fluid consistency: Thin  Diet effective now       ?  ? ?  ?  ? ?  ? ? ?EDUCATION NEEDS:  ? ?No education needs have been identified at this time ? ?Skin:  Skin Assessment: Reviewed RN Assessment ? ?Last BM:  2/28 (type 5 x1, medium amount) ? ?Height:  ? ?Ht Readings from Last 1 Encounters:  ?04/29/21 5\' 10"  (1.778 m)  ? ? ?Weight:  ? ?Wt Readings from Last 1 Encounters:  ?05/01/21 75 kg  ? ? ? ?BMI:  Body mass index is 23.72 kg/m?. ? ?Estimated Nutritional Needs:  ?Kcal:  2000-2200 kcal ?Protein:  100-115 grams ?Fluid:  >/= 2.2 L/day ? ? ? ? ?07/01/21, MS, RD, LDN ?Inpatient Clinical Dietitian ?RD pager # available in AMION  ?After hours/weekend pager # available in AMION ? ?

## 2021-05-01 NOTE — Plan of Care (Signed)
  Problem: Clinical Measurements: Goal: Will remain free from infection Outcome: Progressing Goal: Respiratory complications will improve Outcome: Progressing Goal: Cardiovascular complication will be avoided Outcome: Progressing   Problem: Activity: Goal: Risk for activity intolerance will decrease Outcome: Progressing   Problem: Nutrition: Goal: Adequate nutrition will be maintained Outcome: Progressing   Problem: Elimination: Goal: Will not experience complications related to bowel motility Outcome: Progressing Goal: Will not experience complications related to urinary retention Outcome: Progressing   Problem: Pain Managment: Goal: General experience of comfort will improve Outcome: Progressing   Problem: Safety: Goal: Ability to remain free from injury will improve Outcome: Progressing   Problem: Skin Integrity: Goal: Risk for impaired skin integrity will decrease Outcome: Progressing   

## 2021-05-01 NOTE — Progress Notes (Signed)
? ?NAME:  Brett House, MRN:  [id number], DOB:  23-Jun-1985, LOS: 1 ?ADMISSION DATE:  04/29/2021, CONSULTATION DATE:  2/27 ?REFERRING MD:  Dr. Karene Fry - EDP CHIEF COMPLAINT: Overdose  ? ?History of Present Illness:  ?36 year old man who presented to [facility] ED 2/27 via EMS with concern for overdose after being found down. Given Narcan en route with good response. PMHx significant for extensive mental health history including bipolar disorder, polysubstance abuse (EtOH, cocaine, THC, intranasal heroin) and multiple prior suicide attempts. He has intentionally overdosed on percocet in the past. Recently seen at [facility] ED for SI with a plan (hanging). ? ?On arrival to ED, GCS 13 but patient ultimately became less responsive, then apneic requiring additional Narcan administration. He was started on a Narcan infusion. Upon PCCM evaluation he does not know what happened. He admits to using heroin nasally as well as daily alcohol use. Denies other substances. Denies suicidal ideation. No other complaints on admission. ? ?PCCM asked to admit. ?Had been clean from heroin for nearly 10 years prior to the last 2 months ?He has withdrawn from alcohol in the past (last 2017) ? ?Pertinent Medical History:  ? ?Past Medical History:  ?Diagnosis Date  ? Anxiety   ? Asthma   ? Bipolar disorder (HCC)   ? Depression   ? ?Significant Hospital Events: ?Including procedures, antibiotic start and stop dates in addition to other pertinent events   ?2/27 - Presented to [facility] ED via EMS after found down. Suspected overdose. Reports intranasal heroin use (may have been laced/tainted) and daily EtOH. Required Narcan en route + additional in ED, eventually required Narcan gtt. UDS+ amphetamines.  ?2/28 narcan drip off ? ?Interim History / Subjective:  ?Transferred to [facility]' ?we are reconsulted since he is agitated, hallucinating ?IVC papers filled out by [facility] ? ? ?Objective:  ?Blood pressure 124/72, pulse 82, temperature (!) 96.6 ?F (35.9 ?C), temperature  source Oral, resp. rate 13, height 5\' 10"  (1.778 m), weight 75 kg, SpO2 97 %. ?   ?   ? ?Intake/Output Summary (Last 24 hours) at 05/01/2021 1538 ?Last data filed at 04/30/2021 1625 ?Gross per 24 hour  ?Intake --  ?Output 250 ml  ?Net -250 ml  ? ? ?Filed Weights  ? 04/29/21 1749 04/30/21 0500 05/01/21 0500  ?Weight: 75 kg 75.5 kg 75 kg  ? ?Physical Examination: ?General: Acutely ill-appearing young man in NAD. Appears fatigued. ?HEENT: [place]/AT, anicteric sclera, PERRL, moist mucous membranes.  ?Neuro:sitting in chair, hallucinations +, awake, non focal ?CV: Mild bradycardic, regular rhythm, no m/g/r. ?PULM: no accesory muscle use, clear lungs ?GI: Soft, nontender, nondistended. Normoactive bowel sounds. ?Extremities: No LE edema noted. ?Skin: Warm/dry, no rashes. ? ?Resolved Hospital Problem List:   ? ?Opiate overdose, accidental ?Reports intranasal heroin use. UDS only positive for amphetamines, which are not a home medication. Not sure if it was laced. Uses occasionally. Has injected in the distant past, but now only snorts (previously clean since 2012) ? ?Assessment & Plan:  ? ?EtOH withdrawal delerium ?EtOH abuse ?- Monitor for signs/symptoms of withdrawal ?- ativan per CIWA protocol ?Phenobarb 65 IV q 8 x 3 days ?- Continue thiamine ?- Use precedex if breakthrough inspite of above, goal RASS 0 ? ?Bipolar disorder ?- Holding home fluoxetine, , hydroxyzine for now ?- Resume risperidone, trazodone ? ?Asthma without exacerbation ?- Supplemental O2 as needed to maintain O2 sat > 90% ?- Albuterol PRN ? ? ? ?Best Practice (right click and "Reselect all SmartList Selections" daily)  ? ?Diet/type:  Regular consistency (see orders) ?DVT prophylaxis: SCDs, SQH ?GI prophylaxis: PPI ?Lines: N/A ?Foley:  N/A ?Code Status:  full code ? ? ?Critical care time: N/A  ? ?Comer Locket Vassie Loll, MD ?[facility] ?05/01/21 3:38 PM ? ?Please see [email or web address] for pager details. ? ?From 7A-7P if no response, please call  [phone] ?After hours, please call ELink [phone] ?

## 2021-05-01 NOTE — Assessment & Plan Note (Addendum)
Patient initially managed on Narcan IV drip with improvement of symptoms. Now resolved. Patient agreeable to receiving resources for outpatient rehabiliation. Seen by social work/case management. ?

## 2021-05-01 NOTE — Assessment & Plan Note (Addendum)
Patient with significantly high CIWA scores today. No significant tachycardia/tachypnea. Normotensive. Currently with hallucinations. No capcity on evaluation. Patient required IVC and initiation of Precedex. Precedex discontinued. Phenobarbital discontinued. Patient remained stable. IVC rescinded on 3/4. ?

## 2021-05-01 NOTE — Assessment & Plan Note (Addendum)
-  Nicotine patch 

## 2021-05-02 DIAGNOSIS — T40601A Poisoning by unspecified narcotics, accidental (unintentional), initial encounter: Secondary | ICD-10-CM

## 2021-05-02 LAB — GLUCOSE, CAPILLARY: Glucose-Capillary: 107 mg/dL — ABNORMAL HIGH (ref 70–99)

## 2021-05-02 LAB — BASIC METABOLIC PANEL
Anion gap: 5 (ref 5–15)
BUN: 7 mg/dL (ref 6–20)
CO2: 30 mmol/L (ref 22–32)
Calcium: 8.4 mg/dL — ABNORMAL LOW (ref 8.9–10.3)
Chloride: 104 mmol/L (ref 98–111)
Creatinine, Ser: 0.56 mg/dL — ABNORMAL LOW (ref 0.61–1.24)
GFR, Estimated: >60 mL/min (ref >60)
Glucose, Bld: 117 mg/dL — ABNORMAL HIGH (ref 70–99)
Potassium: 3.6 mmol/L (ref 3.5–5.1)
Sodium: 139 mmol/L (ref 135–145)

## 2021-05-02 MED ORDER — LORAZEPAM 1 MG PO TABS
2.0000 mg | ORAL_TABLET | Freq: Three times a day (TID) | ORAL | Status: DC
  Administered 2021-05-02 – 2021-05-04 (×6): 2 mg via ORAL
  Filled 2021-05-02 (×8): qty 2

## 2021-05-02 NOTE — Progress Notes (Signed)
eLink Physician-Brief Progress Note ?Patient Name: Brett House ?DOB: June 13, 1985 ?MRN: [id number] ? ? ?Date of Service ? 05/02/2021  ?HPI/Events of Note ? Request to renew restraints. Patient on Precedex for agitation related to withdrawal  ?eICU Interventions ? Bilateral wrist restraints renewed  ? ? ? ?Intervention Category ?Minor Interventions: Routine modifications to care plan (e.g. PRN medications for pain, fever) ? ?[name] ?05/02/2021, 7:45 PM ?

## 2021-05-02 NOTE — Plan of Care (Signed)
  Problem: Clinical Measurements: Goal: Will remain free from infection Outcome: Progressing Goal: Diagnostic test results will improve Outcome: Progressing Goal: Respiratory complications will improve Outcome: Progressing Goal: Cardiovascular complication will be avoided Outcome: Progressing   Problem: Nutrition: Goal: Adequate nutrition will be maintained Outcome: Progressing   Problem: Pain Managment: Goal: General experience of comfort will improve Outcome: Progressing   

## 2021-05-02 NOTE — Progress Notes (Signed)
? ?PROGRESS NOTE ? ? ? ?Brett HolsterBryan House  [id number] DOB: Oct 30, 1985 DOA: 04/29/2021 ?PCP: Pcp, No ? ? ?Brief Narrative: ?Brett HolsterBryan House is a 36 y.o. male with a history of bipolar disorder, polysubstance abuse, depression, anxiety. Patient presented after being found down with concern for accidental opiate overdose. Patient was started on a Narcan IV drip with improvement in symptoms. While admitted, he initially declined detox, but subsequently changed his mind. Patient was started on CIWA protocol with worsening symptoms for alcohol withdrawal vs possible acute psychosis. Patient voiced desire to leave the hospital but shows signs of no capacity, so IVC'd on 05/01/2021. ? ? ?Assessment and Plan: ?* Opiate overdose (HCC)-resolved as of 05/01/2021 ?Patient initially managed on Narcan IV drip with improvement of symptoms. Now resolved. Patient agreeable to receiving resources for outpatient detox. ? ?Tobacco use ?-Nicotine patch ? ?Alcohol withdrawal hallucinosis (HCC) ?Patient with significantly high CIWA scores today. No significant tachycardia/tachypnea. Normotensive. Currently with hallucinations. No capcity on evaluation. Patient required IVC and initiation of Precedex ?-Continue CIWA/Precedex per PCCM ? ?Polysubstance abuse (HCC) ?Patient voiced interest in outpatient detox. Will consult TOC for resources once patient is closer to discharge readiness. ? ?Bipolar 1 disorder (HCC) ?Unsure if acute presentation today is related to bipolar diagnosis. Patient is on Risperdal as an outpatient which was held on admission. ?-May need to consult psychiatry eventually this admission pending medical management of possible alcohol withdrawal ? ?Amphetamine abuse (HCC) ?Noted positive result on UDS this admission. Does not seem related to current presentation. ? ?Cocaine use disorder, mild, abuse (HCC) ?History of. UDS on this admission was negative for cocaine. ? ? ? ?DVT prophylaxis: Heparin subq ?Code Status:   Code Status: Full  Code ?Family Communication: None at bedside ?Disposition Plan: Discharge home likely in 2-4 days pending improvement of alcohol withdrawal symptoms/psychosis ? ? ?Consultants:  ?PCCM ? ?Procedures:  ?None ? ?Antimicrobials: ?None  ? ? ?Subjective: ?No issues this morning. ? ?Objective: ?BP 96/60   Pulse 69   Temp 97.7 ?F (36.5 ?C) (Axillary)   Resp 15   Ht 5\' 10"  (1.778 m)   Wt 76.4 kg   SpO2 95%   BMI 24.17 kg/m?  ? ?Examination: ? ?General exam: Appears calm and comfortable ?Respiratory system: Clear to auscultation. Respiratory effort normal. ?Cardiovascular system: S1 & S2 heard, RRR. No murmurs, rubs, gallops or clicks. ?Gastrointestinal system: Abdomen is nondistended, soft and nontender. No organomegaly or masses felt. Normal bowel sounds heard. ?Central nervous system: Alert and oriented to self. ?Musculoskeletal: No edema. No calf tenderness ?Skin: No cyanosis. No rashes ? ? ?Data Reviewed: I have personally reviewed following labs and imaging studies ? ?CBC ?Lab Results  ?Component Value Date  ? WBC 6.9 05/01/2021  ? RBC 5.26 05/01/2021  ? HGB 16.4 05/01/2021  ? HCT 50.3 05/01/2021  ? MCV 95.6 05/01/2021  ? MCH 31.2 05/01/2021  ? PLT 273 05/01/2021  ? MCHC 32.6 05/01/2021  ? RDW 13.6 05/01/2021  ? LYMPHSABS 2.0 05/01/2021  ? MONOABS 0.7 05/01/2021  ? EOSABS 0.2 05/01/2021  ? BASOSABS 0.0 05/01/2021  ? ? ? ?Last metabolic panel ?Lab Results  ?Component Value Date  ? NA 139 05/02/2021  ? K 3.6 05/02/2021  ? CL 104 05/02/2021  ? CO2 30 05/02/2021  ? BUN 7 05/02/2021  ? CREATININE 0.56 (L) 05/02/2021  ? GLUCOSE 117 (H) 05/02/2021  ? GFRNONAA >60 05/02/2021  ? GFRAA >60 10/05/2019  ? CALCIUM 8.4 (L) 05/02/2021  ? PHOS 3.3 04/30/2021  ? PROT  5.2 (L) 05/01/2021  ? ALBUMIN 3.1 (L) 05/01/2021  ? BILITOT 0.4 05/01/2021  ? ALKPHOS 59 05/01/2021  ? AST 15 05/01/2021  ? ALT 14 05/01/2021  ? ANIONGAP 5 05/02/2021  ? ? ?GFR: ?Estimated Creatinine Clearance: 131.8 mL/min (A) (by C-G formula based on SCr of 0.56  mg/dL (L)). ? ?Recent Results (from the past 240 hour(s))  ?Resp Panel by RT-PCR (Flu A&B, Covid) Nasopharyngeal Swab     Status: None  ? Collection Time: 04/29/21  6:04 PM  ? Specimen: Nasopharyngeal Swab; Nasopharyngeal(NP) swabs in vial transport medium  ?Result Value Ref Range Status  ? SARS Coronavirus 2 by RT PCR NEGATIVE NEGATIVE Final  ?  Comment: (NOTE) ?SARS-CoV-2 target nucleic acids are NOT DETECTED. ? ?The SARS-CoV-2 RNA is generally detectable in upper respiratory ?specimens during the acute phase of infection. The lowest ?concentration of SARS-CoV-2 viral copies this assay can detect is ?138 copies/mL. A negative result does not preclude SARS-Cov-2 ?infection and should not be used as the sole basis for treatment or ?other patient management decisions. A negative result may occur with  ?improper specimen collection/handling, submission of specimen other ?than nasopharyngeal swab, presence of viral mutation(s) within the ?areas targeted by this assay, and inadequate number of viral ?copies(<138 copies/mL). A negative result must be combined with ?clinical observations, patient history, and epidemiological ?information. The expected result is Negative. ? ?Fact Sheet for Patients:  ?EntrepreneurPulse.com.au ? ?Fact Sheet for Healthcare Providers:  ?IncredibleEmployment.be ? ?This test is no t yet approved or cleared by the [place] FDA and  ?has been authorized for detection and/or diagnosis of SARS-CoV-2 by ?FDA under an Emergency Use Authorization (EUA). This EUA will remain  ?in effect (meaning this test can be used) for the duration of the ?COVID-19 declaration under Section 564(b)(1) of the Act, 21 ?U.S.C.section 360bbb-3(b)(1), unless the authorization is terminated  ?or revoked sooner.  ? ? ?  ? Influenza A by PCR NEGATIVE NEGATIVE Final  ? Influenza B by PCR NEGATIVE NEGATIVE Final  ?  Comment: (NOTE) ?The Xpert Xpress SARS-CoV-2/FLU/RSV plus assay is  intended as an aid ?in the diagnosis of influenza from Nasopharyngeal swab specimens and ?should not be used as a sole basis for treatment. Nasal washings and ?aspirates are unacceptable for Xpert Xpress SARS-CoV-2/FLU/RSV ?testing. ? ?Fact Sheet for Patients: ?EntrepreneurPulse.com.au ? ?Fact Sheet for Healthcare Providers: ?IncredibleEmployment.be ? ?This test is not yet approved or cleared by the [place] FDA and ?has been authorized for detection and/or diagnosis of SARS-CoV-2 by ?FDA under an Emergency Use Authorization (EUA). This EUA will remain ?in effect (meaning this test can be used) for the duration of the ?COVID-19 declaration under Section 564(b)(1) of the Act, 21 U.S.C. ?section 360bbb-3(b)(1), unless the authorization is terminated or ?revoked. ? ?Performed at [facility], [address] ?[place], [place] [address] ?  ?MRSA Next Gen by PCR, Nasal     Status: None  ? Collection Time: 04/30/21  2:58 AM  ? Specimen: Nasal Mucosa; Nasal Swab  ?Result Value Ref Range Status  ? MRSA by PCR Next Gen NOT DETECTED NOT DETECTED Final  ?  Comment: (NOTE) ?The GeneXpert MRSA Assay (FDA approved for NASAL specimens only), ?is one component of a comprehensive MRSA colonization surveillance ?program. It is not intended to diagnose MRSA infection nor to guide ?or monitor treatment for MRSA infections. ?Test performance is not FDA approved in patients less than 2 years ?old. ?Performed at [facility], [address] ?  [place], [place] [address] ?  ?  ? ? ?Radiology Studies: ?No results found. ? ? ? LOS: 2 days  ? ? ?Cordelia Poche, MD ?[facility] ?05/02/2021, 2:36 PM ? ? ?If 7PM-7AM, please contact night-coverage ?[email or web address] ? ?

## 2021-05-02 NOTE — Progress Notes (Signed)
? ?NAME:  Brett House, MRN:  [id number], DOB:  [birth year], LOS: 2 ?ADMISSION DATE:  04/29/2021, CONSULTATION DATE:  2/27 ?REFERRING MD:  Dr. Armandina Gemma - EDP CHIEF COMPLAINT: Overdose  ? ?History of Present Illness:  ?36 year old man who presented to [facility] ED 2/27 via EMS with concern for overdose after being found down. Given Narcan en route with good response. PMHx significant for extensive mental health history including bipolar disorder, polysubstance abuse (EtOH, cocaine, THC, intranasal heroin) and multiple prior suicide attempts. He has intentionally overdosed on percocet in the past. Recently seen at [facility] ED for SI with a plan (hanging). ? ?On arrival to ED, GCS 13 but patient ultimately became less responsive, then apneic requiring additional Narcan administration. He was started on a Narcan infusion. Upon PCCM evaluation he does not know what happened. He admits to using heroin nasally as well as daily alcohol use. Denies other substances. Denies suicidal ideation. No other complaints on admission. ? ?PCCM asked to admit. ?Had been clean from heroin for nearly 10 years prior to the last 2 months ?He has withdrawn from alcohol in the past (last 2017) ? ?Pertinent Medical History:  ? ?Past Medical History:  ?Diagnosis Date  ? Anxiety   ? Asthma   ? Bipolar disorder ([place])   ? Depression   ? ?Significant Hospital Events: ?Including procedures, antibiotic start and stop dates in addition to other pertinent events   ?2/27 - Presented to [facility] ED via EMS after found down. Suspected overdose. Reports intranasal heroin use (may have been laced/tainted) and daily EtOH. Required Narcan en route + additional in ED, eventually required Narcan gtt. UDS+ amphetamines.  ?2/28 narcan drip off ?3/1 agitated, hallucinating ,IVC papers filled out by [facility], started on Precedex ? ?Interim History / Subjective:  ? ?Required more than 20 mg of Ativan last 24 hours. ?Remains on Precedex drip. ?Calm with sitter at bedside ? ? ? ? ?Objective:   ?Blood pressure 119/76, pulse (!) 55, temperature 97.8 ?F (36.6 ?C), temperature source Axillary, resp. rate (!) 21, height 5\' 10"  (1.778 m), weight 76.4 kg, SpO2 94 %. ?   ?   ? ?Intake/Output Summary (Last 24 hours) at 05/02/2021 0929 ?Last data filed at 05/02/2021 0830 ?Gross per 24 hour  ?Intake 429.78 ml  ?Output 1450 ml  ?Net -1020.22 ml  ? ? ?Filed Weights  ? 04/30/21 0500 05/01/21 0500 05/02/21 0500  ?Weight: 75.5 kg 75 kg 76.4 kg  ? ?Physical Examination: ?General: Acutely ill-appearing young man in NAD.  Supine in bed ?HEENT: [place]/AT, anicteric sclera, PERRL, moist mucous membranes.  ?Neuro: RASS -1 on Precedex drip, nonfocal ?CV: Mild bradycardic, regular rhythm, no m/g/r. ?PULM: Clear lungs, no accessory muscle use ?GI: Soft, nontender, nondistended. Normoactive bowel sounds. ?Extremities: No LE edema noted. ?Skin: Warm/dry, no rashes. ? ?Resolved Hospital Problem List:   ? ?Opiate overdose, accidental ?Reports intranasal heroin use. UDS only positive for amphetamines, which are not a home medication. Not sure if it was laced. Uses occasionally. Has injected in the distant past, but now only snorts (previously clean since 2012) ? ?Assessment & Plan:  ? ?EtOH withdrawal delerium ?EtOH abuse ?- Monitor for signs/symptoms of withdrawal ?- ativan 1 to 4 mg per CIWA protocol , will also use standing Ativan 2 mg thrice daily ?Phenobarb 65 IV q 8 x 3 days ?- Continue thiamine ?-Titrate Precedex precedex  for breakthrough  inspite of above, goal RASS 0 ? ?Bipolar disorder ?- Holding home fluoxetine, , hydroxyzine for now ?- Resumed  risperidone, trazodone ? ?Asthma without exacerbation ?- Supplemental O2 as needed to maintain O2 sat > 90% ?- Albuterol PRN ? ?PCCM will follow while on Precedex ? ?Best Practice (right click and "Reselect all SmartList Selections" daily)  ? ?Diet/type: Regular consistency (see orders) ?DVT prophylaxis: SCDs, SQH ?GI prophylaxis: PPI ?Lines: N/A ?Foley:  N/A ?Code Status:  full  code ? ? ?Critical care time: N/A  ? ?Leanna Sato Elsworth Soho, MD ?[facility] ?05/02/21 9:29 AM ? ?Please see [email or web address] for pager details. ? ?From 7A-7P if no response, please call [phone] ?After hours, please call ELink [phone] ?

## 2021-05-03 DIAGNOSIS — T40601A Poisoning by unspecified narcotics, accidental (unintentional), initial encounter: Secondary | ICD-10-CM

## 2021-05-03 LAB — CBC WITH DIFFERENTIAL/PLATELET
Abs Immature Granulocytes: 0.02 10*3/uL (ref 0.00–0.07)
Basophils Absolute: 0 10*3/uL (ref 0.0–0.1)
Basophils Relative: 0 %
Eosinophils Absolute: 0.2 10*3/uL (ref 0.0–0.5)
Eosinophils Relative: 2 %
HCT: 48.5 % (ref 39.0–52.0)
Hemoglobin: 16.2 g/dL (ref 13.0–17.0)
Immature Granulocytes: 0 %
Lymphocytes Relative: 20 %
Lymphs Abs: 1.4 10*3/uL (ref 0.7–4.0)
MCH: 31.3 pg (ref 26.0–34.0)
MCHC: 33.4 g/dL (ref 30.0–36.0)
MCV: 93.8 fL (ref 80.0–100.0)
Monocytes Absolute: 0.6 10*3/uL (ref 0.1–1.0)
Monocytes Relative: 8 %
Neutro Abs: 4.9 10*3/uL (ref 1.7–7.7)
Neutrophils Relative %: 70 %
Platelets: 262 10*3/uL (ref 150–400)
RBC: 5.17 MIL/uL (ref 4.22–5.81)
RDW: 12.8 % (ref 11.5–15.5)
WBC: 7.1 10*3/uL (ref 4.0–10.5)
nRBC: 0 % (ref 0.0–0.2)

## 2021-05-03 LAB — BASIC METABOLIC PANEL
Anion gap: 7 (ref 5–15)
BUN: 11 mg/dL (ref 6–20)
CO2: 27 mmol/L (ref 22–32)
Calcium: 8.5 mg/dL — ABNORMAL LOW (ref 8.9–10.3)
Chloride: 104 mmol/L (ref 98–111)
Creatinine, Ser: 0.7 mg/dL (ref 0.61–1.24)
GFR, Estimated: >60 mL/min (ref >60)
Glucose, Bld: 123 mg/dL — ABNORMAL HIGH (ref 70–99)
Potassium: 4 mmol/L (ref 3.5–5.1)
Sodium: 138 mmol/L (ref 135–145)

## 2021-05-03 MED ORDER — ACETAMINOPHEN 325 MG PO TABS
650.0000 mg | ORAL_TABLET | Freq: Four times a day (QID) | ORAL | Status: DC | PRN
  Administered 2021-05-03: 650 mg via ORAL
  Filled 2021-05-03: qty 2

## 2021-05-03 MED ORDER — LACTATED RINGERS IV SOLN: INTRAVENOUS | Status: DC

## 2021-05-03 MED ORDER — ORAL CARE MOUTH RINSE
15.0000 mL | Freq: Two times a day (BID) | OROMUCOSAL | Status: DC
  Administered 2021-05-03 – 2021-05-04 (×3): 15 mL via OROMUCOSAL

## 2021-05-03 MED ORDER — MELATONIN 5 MG PO TABS
5.0000 mg | ORAL_TABLET | Freq: Once | ORAL | Status: DC
  Filled 2021-05-03: qty 1

## 2021-05-03 MED ORDER — FLUOXETINE HCL 20 MG PO CAPS
40.0000 mg | ORAL_CAPSULE | Freq: Every day | ORAL | Status: DC
  Administered 2021-05-03 – 2021-05-04 (×2): 40 mg via ORAL
  Filled 2021-05-03 (×2): qty 2

## 2021-05-03 MED ORDER — LACTATED RINGERS IV BOLUS
500.0000 mL | Freq: Once | INTRAVENOUS | Status: AC
  Administered 2021-05-03: 500 mL via INTRAVENOUS

## 2021-05-03 MED ORDER — HALOPERIDOL LACTATE 5 MG/ML IJ SOLN: 5.0000 mg | Freq: Three times a day (TID) | INTRAMUSCULAR | Status: DC | PRN

## 2021-05-03 NOTE — Progress Notes (Signed)
? ?PROGRESS NOTE ? ? ? ?Brett House  [id number] DOB: [birth year] DOA: 04/29/2021 ?PCP: Pcp, No ? ? ?Brief Narrative: ?Brett House is a 36 y.o. male with a history of bipolar disorder, polysubstance abuse, depression, anxiety. Patient presented after being found down with concern for accidental opiate overdose. Patient was started on a Narcan IV drip with improvement in symptoms. While admitted, he initially declined detox, but subsequently changed his mind. Patient was started on CIWA protocol with worsening symptoms for alcohol withdrawal vs possible acute psychosis. Patient voiced desire to leave the hospital but shows signs of no capacity, so IVC'd on 05/01/2021. Stable on Precedex and agitation is improving. ? ? ?Assessment and Plan: ?* Opiate overdose (HCC)-resolved as of 05/01/2021 ?Patient initially managed on Narcan IV drip with improvement of symptoms. Now resolved. Patient agreeable to receiving resources for outpatient detox. ? ?Tobacco use ?-Nicotine patch ? ?Alcohol withdrawal hallucinosis ([place]) ?Patient with significantly high CIWA scores today. No significant tachycardia/tachypnea. Normotensive. Currently with hallucinations. No capcity on evaluation. Patient required IVC and initiation of Precedex ?-Continue CIWA/Precedex per PCCM ? ?Polysubstance abuse ([place]) ?Patient voiced interest in outpatient detox. Will consult TOC for resources once patient is closer to discharge readiness. ? ?Bipolar 1 disorder ([place]) ?Unsure if acute presentation today is related to bipolar diagnosis. Patient is on Risperdal as an outpatient which was held on admission. ?-May need to consult psychiatry eventually this admission pending medical management of possible alcohol withdrawal ? ?Amphetamine abuse ([place]) ?Noted positive result on UDS this admission. Does not seem related to current presentation. ? ?Cocaine use disorder, mild, abuse ([place]) ?History of use. UDS on this admission was negative for cocaine. ? ? ? ?DVT  prophylaxis: Heparin subq ?Code Status:   Code Status: Full Code ?Family Communication: None at bedside ?Disposition Plan: Discharge home likely in 2-3 days pending improvement of alcohol withdrawal symptoms/psychosis ? ? ?Consultants:  ?PCCM ? ?Procedures:  ?None ? ?Antimicrobials: ?None  ? ? ?Subjective: ?Patient had a good night. Still in restraints but less agitated. ? ?Objective: ?BP 107/70   Pulse 65   Temp 97.6 ?F (36.4 ?C) (Axillary)   Resp (!) 21   Ht 5\' 10"  (1.778 m)   Wt 76.9 kg   SpO2 95%   BMI 24.33 kg/m?  ? ?Examination: ? ?General exam: Appears calm and comfortable ?Respiratory system: Clear to auscultation. Respiratory effort normal. ?Cardiovascular system: S1 & S2 heard, RRR. No murmurs, rubs, gallops or clicks. ?Gastrointestinal system: Abdomen is nondistended, soft and nontender. No organomegaly or masses felt. Normal bowel sounds heard. ?Musculoskeletal: No edema. No calf tenderness ?Skin: No cyanosis. No rashes ? ? ?Data Reviewed: I have personally reviewed following labs and imaging studies ? ?CBC ?Lab Results  ?Component Value Date  ? WBC 7.1 05/03/2021  ? RBC 5.17 05/03/2021  ? HGB 16.2 05/03/2021  ? HCT 48.5 05/03/2021  ? MCV 93.8 05/03/2021  ? MCH 31.3 05/03/2021  ? PLT 262 05/03/2021  ? MCHC 33.4 05/03/2021  ? RDW 12.8 05/03/2021  ? LYMPHSABS 1.4 05/03/2021  ? MONOABS 0.6 05/03/2021  ? EOSABS 0.2 05/03/2021  ? BASOSABS 0.0 05/03/2021  ? ? ? ?Last metabolic panel ?Lab Results  ?Component Value Date  ? NA 138 05/03/2021  ? K 4.0 05/03/2021  ? CL 104 05/03/2021  ? CO2 27 05/03/2021  ? BUN 11 05/03/2021  ? CREATININE 0.70 05/03/2021  ? GLUCOSE 123 (H) 05/03/2021  ? GFRNONAA >60 05/03/2021  ? GFRAA >60 10/05/2019  ? CALCIUM 8.5 (L) 05/03/2021  ?  PHOS 3.3 04/30/2021  ? PROT 5.2 (L) 05/01/2021  ? ALBUMIN 3.1 (L) 05/01/2021  ? BILITOT 0.4 05/01/2021  ? ALKPHOS 59 05/01/2021  ? AST 15 05/01/2021  ? ALT 14 05/01/2021  ? ANIONGAP 7 05/03/2021  ? ? ?GFR: ?Estimated Creatinine Clearance: 131.8  mL/min (by C-G formula based on SCr of 0.7 mg/dL). ? ?Recent Results (from the past 240 hour(s))  ?Resp Panel by RT-PCR (Flu A&B, Covid) Nasopharyngeal Swab     Status: None  ? Collection Time: 04/29/21  6:04 PM  ? Specimen: Nasopharyngeal Swab; Nasopharyngeal(NP) swabs in vial transport medium  ?Result Value Ref Range Status  ? SARS Coronavirus 2 by RT PCR NEGATIVE NEGATIVE Final  ?  Comment: (NOTE) ?SARS-CoV-2 target nucleic acids are NOT DETECTED. ? ?The SARS-CoV-2 RNA is generally detectable in upper respiratory ?specimens during the acute phase of infection. The lowest ?concentration of SARS-CoV-2 viral copies this assay can detect is ?138 copies/mL. A negative result does not preclude SARS-Cov-2 ?infection and should not be used as the sole basis for treatment or ?other patient management decisions. A negative result may occur with  ?improper specimen collection/handling, submission of specimen other ?than nasopharyngeal swab, presence of viral mutation(s) within the ?areas targeted by this assay, and inadequate number of viral ?copies(<138 copies/mL). A negative result must be combined with ?clinical observations, patient history, and epidemiological ?information. The expected result is Negative. ? ?Fact Sheet for Patients:  ?EntrepreneurPulse.com.au ? ?Fact Sheet for Healthcare Providers:  ?IncredibleEmployment.be ? ?This test is no t yet approved or cleared by the [place] FDA and  ?has been authorized for detection and/or diagnosis of SARS-CoV-2 by ?FDA under an Emergency Use Authorization (EUA). This EUA will remain  ?in effect (meaning this test can be used) for the duration of the ?COVID-19 declaration under Section 564(b)(1) of the Act, 21 ?U.S.C.section 360bbb-3(b)(1), unless the authorization is terminated  ?or revoked sooner.  ? ? ?  ? Influenza A by PCR NEGATIVE NEGATIVE Final  ? Influenza B by PCR NEGATIVE NEGATIVE Final  ?  Comment: (NOTE) ?The Xpert Xpress  SARS-CoV-2/FLU/RSV plus assay is intended as an aid ?in the diagnosis of influenza from Nasopharyngeal swab specimens and ?should not be used as a sole basis for treatment. Nasal washings and ?aspirates are unacceptable for Xpert Xpress SARS-CoV-2/FLU/RSV ?testing. ? ?Fact Sheet for Patients: ?EntrepreneurPulse.com.au ? ?Fact Sheet for Healthcare Providers: ?IncredibleEmployment.be ? ?This test is not yet approved or cleared by the [place] FDA and ?has been authorized for detection and/or diagnosis of SARS-CoV-2 by ?FDA under an Emergency Use Authorization (EUA). This EUA will remain ?in effect (meaning this test can be used) for the duration of the ?COVID-19 declaration under Section 564(b)(1) of the Act, 21 U.S.C. ?section 360bbb-3(b)(1), unless the authorization is terminated or ?revoked. ? ?Performed at [facility], [address] ?[place], [place] [address] ?  ?MRSA Next Gen by PCR, Nasal     Status: None  ? Collection Time: 04/30/21  2:58 AM  ? Specimen: Nasal Mucosa; Nasal Swab  ?Result Value Ref Range Status  ? MRSA by PCR Next Gen NOT DETECTED NOT DETECTED Final  ?  Comment: (NOTE) ?The GeneXpert MRSA Assay (FDA approved for NASAL specimens only), ?is one component of a comprehensive MRSA colonization surveillance ?program. It is not intended to diagnose MRSA infection nor to guide ?or monitor treatment for MRSA infections. ?Test performance is not FDA approved in patients less than 2 years ?old. ?Performed at [facility],  [address] ?[place], [place] [address] ?  ?  ? ? ?Radiology Studies: ?No results found. ? ? ? LOS: 3 days  ? ? ?Cordelia Poche, MD ?[facility] ?05/03/2021, 7:34 AM ? ? ?If 7PM-7AM, please contact night-coverage ?[email or web address] ? ?

## 2021-05-03 NOTE — Progress Notes (Signed)
? ?NAME:  Brett House, MRN:  [id number], DOB:  1986-02-11, LOS: 3 ?ADMISSION DATE:  04/29/2021, CONSULTATION DATE:  2/27 ?REFERRING MD:  Dr. Armandina Gemma - EDP CHIEF COMPLAINT: Overdose  ? ?History of Present Illness:  ?36 year old man who presented to [facility] ED 2/27 via EMS with concern for overdose after being found down. Given Narcan en route with good response. PMHx significant for extensive mental health history including bipolar disorder, polysubstance abuse (EtOH, cocaine, THC, intranasal heroin) and multiple prior suicide attempts. He has intentionally overdosed on percocet in the past. Recently seen at [facility] ED for SI with a plan (hanging). ? ?On arrival to ED, GCS 13 but patient ultimately became less responsive, then apneic requiring additional Narcan administration. He was started on a Narcan infusion. Upon PCCM evaluation he does not know what happened. He admits to using heroin nasally as well as daily alcohol use. Denies other substances. Denies suicidal ideation. No other complaints on admission. ? ?PCCM asked to admit. ?Had been clean from heroin for nearly 10 years prior to the last 2 months ?He has withdrawn from alcohol in the past (last 2017) ? ?Pertinent Medical History:  ? ?Past Medical History:  ?Diagnosis Date  ? Anxiety   ? Asthma   ? Bipolar disorder ([place])   ? Depression   ? ?Significant Hospital Events: ?Including procedures, antibiotic start and stop dates in addition to other pertinent events   ?2/27 - Presented to [facility] ED via EMS after found down. Suspected overdose. Reports intranasal heroin use (may have been laced/tainted) and daily EtOH. Required Narcan en route + additional in ED, eventually required Narcan gtt. UDS+ amphetamines.  ?2/28 narcan drip off ?3/1 agitated, hallucinating ,IVC papers filled out by [facility], started on Precedex ?3/3 Precedex turned off after bout of hypotension ? ?Interim History / Subjective:  ?Had 8 Ativan overnight per RN. Receiving scheduled Phenobarb as  well. ?Precedex was at 0.5 this AM; however, he got up to use restroom then had abrupt episode of hypotension with SBP in 80s.  Mental status somewhat depressed but still awake.  Precedex stopped and 500cc LR bolus started.  BP gradually improving, mental status fine and able to answer all questions.  He is calm and cooperative and is asking when he can go home. ? ?Objective:  ?Blood pressure 123/71, pulse 61, temperature 97.6 ?F (36.4 ?C), temperature source Oral, resp. rate (!) 21, height 5\' 10"  (1.778 m), weight 76.9 kg, SpO2 96 %. ?   ?   ? ?Intake/Output Summary (Last 24 hours) at 05/03/2021 1012 ?Last data filed at 05/03/2021 0830 ?Gross per 24 hour  ?Intake 1355.95 ml  ?Output 1700 ml  ?Net -344.05 ml  ? ? ?Filed Weights  ? 05/01/21 0500 05/02/21 0500 05/03/21 0500  ?Weight: 75 kg 76.4 kg 76.9 kg  ? ?Physical Examination: ?General: Adult male, resting in bed, in NAD. ?Neuro: A&O x 3, no deficts . ?HEENT: [place]/AT. Sclerae anicteric. EOMI. ?Cardiovascular: RRR, no M/R/G.  ?Lungs: Respirations even and unlabored.  CTA bilaterally, No W/R/R. ?Abdomen: BS x 4, soft, NT/ND.  ?Musculoskeletal: No gross deformities, no edema.  ?Skin: Intact, warm, no rashes. ? ?Resolved Hospital Problem List:   ? ?Opiate overdose, accidental ?Reports intranasal heroin use. UDS only positive for amphetamines, which are not a home medication. Not sure if it was laced. Uses occasionally. Has injected in the distant past, but now only snorts (previously clean since 2012) ? ?Assessment & Plan:  ? ?EtOH withdrawal with delirium. ?EtOH abuse. ?- Monitor for  signs/symptoms of withdrawal. ?- ativan 1 to 4 mg per CIWA protocol , will also use standing Ativan 2 mg thrice daily. ?- Phenobarb 65 IV q 8 ?- Add PRN Haldol. ?- Hold Precedex for now given hypotension. ?- Hold Clonidine initiation for now given hypotension.  If needed, can start low dose once improves. ?- Continue thiamine. ? ?Hx Bipolar disorder, Anxiety, Depression. ?- Resume home  Fluoxetine. ?- Continue home Hydroxyzine, Risperidone, Trazodone. ? ?Asthma without exacerbation. ?- Supplemental O2 as needed to maintain O2 sat > 90%. ?- Albuterol PRN. ? ? ?Try to keep off Precedex completely. ?Once BP improves, can start low dose Clonidine as able instead of resuming Precedex. ? ?PCCM to follow peripherally to ensure remains off Precedex and if so, will sign off. ? ?Best Practice (right click and "Reselect all SmartList Selections" daily)  ? ?Diet/type: Regular consistency (see orders) ?DVT prophylaxis: SCDs, SQH ?GI prophylaxis: PPI ?Lines: N/A ?Foley:  N/A ?Code Status:  full code ? ? ?Critical care time: N/A  ? ? ?Montey Hora, PA - C ?[facility] ?For pager details, please see AMION or use Epic chat  ?After 1900, please call [facility] for cross coverage needs ?05/03/2021, 10:29 AM ? ?

## 2021-05-03 NOTE — Progress Notes (Signed)
Patient is requesting Haldol for "sleep". Education was provided regarding the indications for Haldol.  ? ?He was informed of the order for Trazadone, to which he informed the primary RN of an allergy to Trazadone. He has an anaphylactic reaction.  ? ?Primary team informed.  ?

## 2021-05-03 NOTE — Progress Notes (Signed)
Patient is requesting "Haldol" for sleep. He refused the ordered Melatonin. He states, the Melatonin, Ambien Lunesta and Seroquel without relief.  ? ? ?Education was once again provided regarding the indications for Haldol.  ? ?Primary RN suggested decreasing external stimuli to help promote sleep.  ? ? ?Will continue to monitor.  ?

## 2021-05-04 LAB — BASIC METABOLIC PANEL
Anion gap: 6 (ref 5–15)
BUN: 12 mg/dL (ref 6–20)
CO2: 26 mmol/L (ref 22–32)
Calcium: 8.1 mg/dL — ABNORMAL LOW (ref 8.9–10.3)
Chloride: 106 mmol/L (ref 98–111)
Creatinine, Ser: 0.64 mg/dL (ref 0.61–1.24)
GFR, Estimated: >60 mL/min (ref >60)
Glucose, Bld: 88 mg/dL (ref 70–99)
Potassium: 3.6 mmol/L (ref 3.5–5.1)
Sodium: 138 mmol/L (ref 135–145)

## 2021-05-04 LAB — CBC
HCT: 43.3 % (ref 39.0–52.0)
Hemoglobin: 14.9 g/dL (ref 13.0–17.0)
MCH: 32 pg (ref 26.0–34.0)
MCHC: 34.4 g/dL (ref 30.0–36.0)
MCV: 92.9 fL (ref 80.0–100.0)
Platelets: 225 10*3/uL (ref 150–400)
RBC: 4.66 MIL/uL (ref 4.22–5.81)
RDW: 13 % (ref 11.5–15.5)
WBC: 5.6 10*3/uL (ref 4.0–10.5)
nRBC: 0 % (ref 0.0–0.2)

## 2021-05-04 LAB — MAGNESIUM: Magnesium: 2.2 mg/dL (ref 1.7–2.4)

## 2021-05-04 LAB — PHOSPHORUS: Phosphorus: 3.5 mg/dL (ref 2.5–4.6)

## 2021-05-04 MED ORDER — MELATONIN 5 MG PO TABS
10.0000 mg | ORAL_TABLET | Freq: Once | ORAL | Status: AC
  Administered 2021-05-04: 10 mg via ORAL
  Filled 2021-05-04: qty 2

## 2021-05-04 NOTE — TOC Progression Note (Addendum)
Transition of Care (TOC) - Progression Note  ? ? ?Patient Details  ?Name: Arul Farabee ?MRN: [id number] ?Date of Birth: Sep 29, 1985 ? ?Transition of Care (TOC) CM/SW Contact  ?Lanier Clam, RN ?Phone Number: ?05/04/2021, 10:23 AM ? ?Clinical Narrative: If rescind of IVC needed-await MD/psych documentation.MD updated.  ?-2p-ivc rescind form on shadow chart for MD signature.Will fax once signed. ?-4p-IVC rescinded-form in shadow chart. ? ? ? ?Expected Discharge Plan: Home/Self Care ?Barriers to Discharge: Inadequate or no insurance, Continued Medical Work up [employer]) ? ?Expected Discharge Plan and Services ?Expected Discharge Plan: Home/Self Care ?In-house Referral: Clinical Social Work ?  ?Post Acute Care Choice: NA ?Living arrangements for the past 2 months: Homeless ?                ?DME Arranged: N/A ?DME Agency: NA ?  ?  ?  ?  ?  ?  ?  ?  ? ? ?Social Determinants of Health (SDOH) Interventions ?  ? ?Readmission Risk Interventions ?Readmission Risk Prevention Plan 05/01/2021  ?Medication Review [employer]) Complete  ?SW Recovery Care/Counseling Consult Complete  ?Palliative Care Screening Not Applicable  ?Skilled Nursing Facility Not Applicable  ? ? ?

## 2021-05-04 NOTE — Progress Notes (Signed)
?  Education was once again provided regarding the indications for Haldol.  ?  ?Primary RN suggested decreasing external stimuli to help promote sleep once again.  ? ?Patient is now requesting Ambien, [facility] informed.   ?  ?  ?Will continue to monitor.  ?

## 2021-05-04 NOTE — Progress Notes (Signed)
Pt asleep, promoting rest at this time.  ?

## 2021-05-04 NOTE — Progress Notes (Signed)
Patient is requesting Haldol for sleep. Education was provided regarding the indications for Haldol.  ? ?He was encouraged to decrease environmental stimuli.  ? ?He requested a message be sent to his doctor for a sleep aid.  ?

## 2021-05-04 NOTE — Progress Notes (Signed)
? ?PROGRESS NOTE ? ? ? ?[name]  CNO:[id number] DOB: 02/06/1986 DOA: 04/29/2021 ?PCP: Pcp, No ? ? ?Brief Narrative: ?Brett House is a 36 y.o. male with a history of bipolar disorder, polysubstance abuse, depression, anxiety. Patient presented after being found down with concern for accidental opiate overdose. Patient was started on a Narcan IV drip with improvement in symptoms. While admitted, he initially declined detox, but subsequently changed his mind. Patient was started on CIWA protocol with worsening symptoms for alcohol withdrawal vs possible acute psychosis. Patient voiced desire to leave the hospital but shows signs of no capacity, so IVC'd on 05/01/2021. Stable on Precedex and agitation. Precedex discontinued. Improving. ? ? ?Assessment and Plan: ?* Opiate overdose (HCC)-resolved as of 05/01/2021 ?Patient initially managed on Narcan IV drip with improvement of symptoms. Now resolved. Patient agreeable to receiving resources for outpatient detox. ? ?Tobacco use ?-Nicotine patch ? ?Alcohol withdrawal hallucinosis (HCC) ?Patient with significantly high CIWA scores today. No significant tachycardia/tachypnea. Normotensive. Currently with hallucinations. No capcity on evaluation. Patient required IVC and initiation of Precedex. Precedex discontinued. ?-Discontinue Phenobarbital ?-Continue CIWA ?-Rescind IVC ? ?Polysubstance abuse (HCC) ?Patient voiced interest in outpatient detox. Will consult TOC for resources once patient is closer to discharge readiness. ? ?Bipolar 1 disorder (HCC) ?Unsure if acute presentation today is related to bipolar diagnosis. Patient is on Risperdal as an outpatient which was held on admission. Unlikely need for psychiatry consult. ? ?Amphetamine abuse (HCC) ?Noted positive result on UDS this admission. Does not seem related to current presentation. ? ?Cocaine use disorder, mild, abuse (HCC) ?History of use. UDS on this admission was negative for cocaine. ? ? ? ?DVT prophylaxis:  Heparin subq ?Code Status:   Code Status: Full Code ?Family Communication: None at bedside ?Disposition Plan: Discharge home likely in 1-2 days pending stability of alcohol withdrawal symptoms/psychosis ? ? ?Consultants:  ?PCCM ? ?Procedures:  ?None ? ?Antimicrobials: ?None  ? ? ?Subjective: ?No concerns per patient today. ? ?Objective: ?BP 127/66 (BP Location: Left Arm)   Pulse 76   Temp 97.8 ?F (36.6 ?C) (Oral)   Resp 17   Ht 5\' 10"  (1.778 m)   Wt 77.7 kg   SpO2 94%   BMI 24.58 kg/m?  ? ?Examination: ? ?General exam: Appears calm and comfortable ?Respiratory system: Clear to auscultation. Respiratory effort normal. ?Cardiovascular system: S1 & S2 heard, RRR. No murmurs, rubs, gallops or clicks. ?Gastrointestinal system: Abdomen is nondistended, soft and nontender. No organomegaly or masses felt. Normal bowel sounds heard. ?Central nervous system: Alert and oriented. No focal neurological deficits. ?Musculoskeletal: No edema. No calf tenderness ?Skin: No cyanosis. No rashes ?Psychiatry: Judgement and insight appear normal. Mood & affect appropriate. ? ? ?Data Reviewed: I have personally reviewed following labs and imaging studies ? ?CBC ?Lab Results  ?Component Value Date  ? WBC 5.6 05/04/2021  ? RBC 4.66 05/04/2021  ? HGB 14.9 05/04/2021  ? HCT 43.3 05/04/2021  ? MCV 92.9 05/04/2021  ? MCH 32.0 05/04/2021  ? PLT 225 05/04/2021  ? MCHC 34.4 05/04/2021  ? RDW 13.0 05/04/2021  ? LYMPHSABS 1.4 05/03/2021  ? MONOABS 0.6 05/03/2021  ? EOSABS 0.2 05/03/2021  ? BASOSABS 0.0 05/03/2021  ? ? ? ?Last metabolic panel ?Lab Results  ?Component Value Date  ? NA 138 05/04/2021  ? K 3.6 05/04/2021  ? CL 106 05/04/2021  ? CO2 26 05/04/2021  ? BUN 12 05/04/2021  ? CREATININE 0.64 05/04/2021  ? GLUCOSE 88 05/04/2021  ? GFRNONAA >60 05/04/2021  ?  GFRAA >60 10/05/2019  ? CALCIUM 8.1 (L) 05/04/2021  ? PHOS 3.5 05/04/2021  ? PROT 5.2 (L) 05/01/2021  ? ALBUMIN 3.1 (L) 05/01/2021  ? BILITOT 0.4 05/01/2021  ? ALKPHOS 59 05/01/2021  ?  AST 15 05/01/2021  ? ALT 14 05/01/2021  ? ANIONGAP 6 05/04/2021  ? ? ?GFR: ?Estimated Creatinine Clearance: 131.8 mL/min (by C-G formula based on SCr of 0.64 mg/dL). ? ?Recent Results (from the past 240 hour(s))  ?Resp Panel by RT-PCR (Flu A&B, Covid) Nasopharyngeal Swab     Status: None  ? Collection Time: 04/29/21  6:04 PM  ? Specimen: Nasopharyngeal Swab; Nasopharyngeal(NP) swabs in vial transport medium  ?Result Value Ref Range Status  ? SARS Coronavirus 2 by RT PCR NEGATIVE NEGATIVE Final  ?  Comment: (NOTE) ?SARS-CoV-2 target nucleic acids are NOT DETECTED. ? ?The SARS-CoV-2 RNA is generally detectable in upper respiratory ?specimens during the acute phase of infection. The lowest ?concentration of SARS-CoV-2 viral copies this assay can detect is ?138 copies/mL. A negative result does not preclude SARS-Cov-2 ?infection and should not be used as the sole basis for treatment or ?other patient management decisions. A negative result may occur with  ?improper specimen collection/handling, submission of specimen other ?than nasopharyngeal swab, presence of viral mutation(s) within the ?areas targeted by this assay, and inadequate number of viral ?copies(<138 copies/mL). A negative result must be combined with ?clinical observations, patient history, and epidemiological ?information. The expected result is Negative. ? ?Fact Sheet for Patients:  ?BloggerCourse.com ? ?Fact Sheet for Healthcare Providers:  ?SeriousBroker.it ? ?This test is no t yet approved or cleared by the [place] FDA and  ?has been authorized for detection and/or diagnosis of SARS-CoV-2 by ?FDA under an Emergency Use Authorization (EUA). This EUA will remain  ?in effect (meaning this test can be used) for the duration of the ?COVID-19 declaration under Section 564(b)(1) of the Act, 21 ?U.S.C.section 360bbb-3(b)(1), unless the authorization is terminated  ?or revoked sooner.  ? ? ?  ? Influenza A  by PCR NEGATIVE NEGATIVE Final  ? Influenza B by PCR NEGATIVE NEGATIVE Final  ?  Comment: (NOTE) ?The Xpert Xpress SARS-CoV-2/FLU/RSV plus assay is intended as an aid ?in the diagnosis of influenza from Nasopharyngeal swab specimens and ?should not be used as a sole basis for treatment. Nasal washings and ?aspirates are unacceptable for Xpert Xpress SARS-CoV-2/FLU/RSV ?testing. ? ?Fact Sheet for Patients: ?BloggerCourse.com ? ?Fact Sheet for Healthcare Providers: ?SeriousBroker.it ? ?This test is not yet approved or cleared by the [place] FDA and ?has been authorized for detection and/or diagnosis of SARS-CoV-2 by ?FDA under an Emergency Use Authorization (EUA). This EUA will remain ?in effect (meaning this test can be used) for the duration of the ?COVID-19 declaration under Section 564(b)(1) of the Act, 21 U.S.C. ?section 360bbb-3(b)(1), unless the authorization is terminated or ?revoked. ? ?Performed at [facility], [address] ?[place], [place] [address] ?  ?MRSA Next Gen by PCR, Nasal     Status: None  ? Collection Time: 04/30/21  2:58 AM  ? Specimen: Nasal Mucosa; Nasal Swab  ?Result Value Ref Range Status  ? MRSA by PCR Next Gen NOT DETECTED NOT DETECTED Final  ?  Comment: (NOTE) ?The GeneXpert MRSA Assay (FDA approved for NASAL specimens only), ?is one component of a comprehensive MRSA colonization surveillance ?program. It is not intended to diagnose MRSA infection nor to guide ?or monitor treatment for MRSA infections. ?Test performance is not FDA approved in patients  less than 2 years ?old. ?Performed at [facility], [address] ?[place], [place] [address] ?  ?  ? ? ?Radiology Studies: ?No results found. ? ? ? LOS: 4 days  ? ? ?Jacquelin Hawking, MD ?[facility] ?05/04/2021, 10:22 AM ? ? ?If 7PM-7AM, please contact night-coverage ?[email or web address] ? ?

## 2021-05-04 NOTE — Progress Notes (Signed)
Patient is asleep, will hold scheduled medication to promote rest. ? ?Will continue to monitor.  ?

## 2021-05-05 MED ORDER — FOLIC ACID 1 MG PO TABS: 1.0000 mg | ORAL_TABLET | Freq: Every day | ORAL | Status: DC

## 2021-05-05 MED ORDER — THIAMINE HCL 100 MG PO TABS: 100.0000 mg | ORAL_TABLET | Freq: Every day | ORAL | Status: DC

## 2021-05-05 MED ORDER — ADULT MULTIVITAMIN W/MINERALS CH: 1.0000 | ORAL_TABLET | Freq: Every day | ORAL | Status: DC

## 2021-05-05 NOTE — Assessment & Plan Note (Signed)
Continue Risperdal.

## 2021-05-05 NOTE — Progress Notes (Signed)
Patient discharged. PIVs removed. AVS went over with patient and he stated he had no further questions. Belongings returned. Walked down to the front entrance.  ?

## 2021-05-05 NOTE — Discharge Summary (Signed)
Physician Discharge Summary   Patient: Brett House MRN: [id number] DOB: [birth year]  Admit date:     04/29/2021  Discharge date: 05/05/21  Discharge Physician: Jacquelin Hawking, MD   PCP: Pcp, No   Recommendations at discharge:   Outpatient substance abuse rehabilitation  Discharge Diagnoses: Active Problems:   Cocaine use disorder, mild, abuse (HCC)   Bipolar disorder (HCC)   Amphetamine abuse (HCC)   Bipolar 1 disorder (HCC)   Polysubstance abuse (HCC)   Alcohol withdrawal hallucinosis (HCC)   Tobacco use  Principal Problem (Resolved):   Opiate overdose [facility])   Hospital Course: Brett House is a 36 y.o. male with a history of bipolar disorder, polysubstance abuse, depression, anxiety. Patient presented after being found down with concern for accidental opiate overdose. Patient was started on a Narcan IV drip with improvement in symptoms. While admitted, he initially declined detox, but subsequently changed his mind. Patient was started on CIWA protocol with worsening symptoms for alcohol withdrawal vs possible acute psychosis. Patient voiced desire to leave the hospital but shows signs of no capacity, so IVC'd on 05/01/2021. Patient was started on phenobarbital and Precedex with improved management of withdrawal symptoms. Precedex weaned off, followed by phenobarbital. IVC rescinded on 3/4.  Assessment and Plan: * Opiate overdose (HCC)-resolved as of 05/01/2021 Patient initially managed on Narcan IV drip with improvement of symptoms. Now resolved. Patient agreeable to receiving resources for outpatient rehabiliation. Seen by social work/case management.  Tobacco use Nicotine patch  Alcohol withdrawal hallucinosis (HCC) Patient with significantly high CIWA scores today. No significant tachycardia/tachypnea. Normotensive. Currently with hallucinations. No capcity on evaluation. Patient required IVC and initiation of Precedex. Precedex discontinued. Phenobarbital discontinued. Patient  remained stable. IVC rescinded on 3/4.  Polysubstance abuse [facility]) Patient voiced interest in outpatient detox. Will consult TOC for resources once patient is closer to discharge readiness.  Bipolar 1 disorder (HCC) Unsure if acute presentation today is related to bipolar diagnosis. Patient is on Risperdal as an outpatient which was held on admission. Unlikely need for psychiatry consult.  Amphetamine abuse (HCC) Noted positive result on UDS this admission. Does not seem related to current presentation.  Bipolar disorder (HCC) Continue Risperdal.  Cocaine use disorder, mild, abuse (HCC) History of use. UDS on this admission was negative for cocaine.           Consultants: PCCM Procedures performed: None  Disposition: Relative's home Diet recommendation:  Regular diet  DISCHARGE MEDICATION: Allergies as of 05/05/2021       Reactions   Desyrel [trazodone] Anaphylaxis   Erythromycin Other (See Comments)   Unknown childhood allergic reaction        Medication List     TAKE these medications    albuterol 108 (90 Base) MCG/ACT inhaler Commonly known as: VENTOLIN HFA Inhale 1-2 puffs into the lungs every 4 (four) hours as needed for wheezing or shortness of breath.   FLUoxetine 40 MG capsule Commonly known as: PROZAC Take 1 capsule (40 mg total) by mouth daily.   folic acid 1 MG tablet Commonly known as: FOLVITE Take 1 tablet (1 mg total) by mouth daily.   hydrOXYzine 25 MG tablet Commonly known as: ATARAX Take 1 tablet (25 mg total) by mouth every 6 (six) hours as needed for anxiety.   multivitamin with minerals Tabs tablet Take 1 tablet by mouth daily.   nicotine 21 mg/24hr patch Commonly known as: NICODERM CQ - dosed in mg/24 hours Place 1 patch (21 mg total) onto the skin daily.  risperiDONE 2 MG tablet Commonly known as: RISPERDAL Take 1 tablet (2 mg total) by mouth at bedtime.   thiamine 100 MG tablet Take 1 tablet (100 mg total) by mouth  daily.         Discharge Exam: Filed Weights   05/03/21 0500 05/04/21 0500 05/05/21 0500  Weight: 76.9 kg 77.7 kg 77.4 kg   General exam: Appears calm and comfortable Respiratory system: Clear to auscultation. Respiratory effort normal. Cardiovascular system: S1 & S2 heard, RRR. No murmurs, rubs, gallops or clicks. Gastrointestinal system: Abdomen is nondistended, soft and nontender. No organomegaly or masses felt. Normal bowel sounds heard. Central nervous system: Alert and oriented. No focal neurological deficits. Musculoskeletal: No edema. No calf tenderness Skin: No cyanosis. No rashes Psychiatry: Judgement and insight appear normal. Mood & affect appropriate.   Condition at discharge: stable  The results of significant diagnostics from this hospitalization (including imaging, microbiology, ancillary and laboratory) are listed below for reference.   Imaging Studies: DG Chest Portable 1 View  Result Date: 04/29/2021 CLINICAL DATA:  Overdose EXAM: PORTABLE CHEST 1 VIEW COMPARISON:  Chest x-ray dated September 23, 2020 FINDINGS: Cardiac and mediastinal contours are within normal limits. Lungs are clear. No large pleural effusion or evidence of pneumothorax. IMPRESSION: No acute cardiopulmonary abnormality. Electronically Signed   By: Allegra Lai M.D.   On: 04/29/2021 18:23    Microbiology: Results for orders placed or performed during the hospital encounter of 04/29/21  Resp Panel by RT-PCR (Flu A&B, Covid) Nasopharyngeal Swab     Status: None   Collection Time: 04/29/21  6:04 PM   Specimen: Nasopharyngeal Swab; Nasopharyngeal(NP) swabs in vial transport medium  Result Value Ref Range Status   SARS Coronavirus 2 by RT PCR NEGATIVE NEGATIVE Final    Comment: (NOTE) SARS-CoV-2 target nucleic acids are NOT DETECTED.  The SARS-CoV-2 RNA is generally detectable in upper respiratory specimens during the acute phase of infection. The lowest concentration of SARS-CoV-2 viral copies  this assay can detect is 138 copies/mL. A negative result does not preclude SARS-Cov-2 infection and should not be used as the sole basis for treatment or other patient management decisions. A negative result may occur with  improper specimen collection/handling, submission of specimen other than nasopharyngeal swab, presence of viral mutation(s) within the areas targeted by this assay, and inadequate number of viral copies(<138 copies/mL). A negative result must be combined with clinical observations, patient history, and epidemiological information. The expected result is Negative.  Fact Sheet for Patients:  BloggerCourse.com  Fact Sheet for Healthcare Providers:  SeriousBroker.it  This test is no t yet approved or cleared by the [place] FDA and  has been authorized for detection and/or diagnosis of SARS-CoV-2 by FDA under an Emergency Use Authorization (EUA). This EUA will remain  in effect (meaning this test can be used) for the duration of the COVID-19 declaration under Section 564(b)(1) of the Act, 21 U.S.C.section 360bbb-3(b)(1), unless the authorization is terminated  or revoked sooner.       Influenza A by PCR NEGATIVE NEGATIVE Final   Influenza B by PCR NEGATIVE NEGATIVE Final    Comment: (NOTE) The Xpert Xpress SARS-CoV-2/FLU/RSV plus assay is intended as an aid in the diagnosis of influenza from Nasopharyngeal swab specimens and should not be used as a sole basis for treatment. Nasal washings and aspirates are unacceptable for Xpert Xpress SARS-CoV-2/FLU/RSV testing.  Fact Sheet for Patients: BloggerCourse.com  Fact Sheet for Healthcare Providers: SeriousBroker.it  This test is not yet approved  or cleared by the [place] and has been authorized for detection and/or diagnosis of SARS-CoV-2 by FDA under an Emergency Use Authorization (EUA). This EUA  will remain in effect (meaning this test can be used) for the duration of the COVID-19 declaration under Section 564(b)(1) of the Act, 21 U.S.C. section 360bbb-3(b)(1), unless the authorization is terminated or revoked.  Performed at [facility], [address] [place], [place] [address]   MRSA Next Gen by PCR, Nasal     Status: None   Collection Time: 04/30/21  2:58 AM   Specimen: Nasal Mucosa; Nasal Swab  Result Value Ref Range Status   MRSA by PCR Next Gen NOT DETECTED NOT DETECTED Final    Comment: (NOTE) The GeneXpert MRSA Assay (FDA approved for NASAL specimens only), is one component of a comprehensive MRSA colonization surveillance program. It is not intended to diagnose MRSA infection nor to guide or monitor treatment for MRSA infections. Test performance is not FDA approved in patients less than 60 years old. Performed at [facility], [address] [place], [place] [address]     Labs: CBC: Recent Labs  Lab 04/29/21 2355 04/30/21 0253 05/01/21 0242 05/03/21 0241 05/04/21 0600  WBC 8.4 8.6 6.9 7.1 5.6  NEUTROABS  --   --  4.0 4.9  --   HGB 15.5 15.8 16.4 16.2 14.9  HCT 45.7 47.8 50.3 48.5 43.3  MCV 93.6 94.5 95.6 93.8 92.9  PLT 286 304 273 262 225   Basic Metabolic Panel: Recent Labs  Lab 04/29/21 2355 04/30/21 0253 05/01/21 0242 05/02/21 0241 05/03/21 0241 05/04/21 0600  NA 138 138 137 139 138 138  K 3.5 3.6 3.9 3.6 4.0 3.6  CL 106 105 103 104 104 106  CO2 25 28 30 30 27 26   GLUCOSE 126* 87 93 117* 123* 88  BUN 6 5* 7 7 11 12   CREATININE 0.51* 0.62 0.74 0.56* 0.70 0.64  CALCIUM 7.5* 7.9* 8.1* 8.4* 8.5* 8.1*  MG 2.1 2.2 2.2  --   --  2.2  PHOS 3.5 3.3  --   --   --  3.5   Liver Function Tests: Recent Labs  Lab 04/29/21 2355 05/01/21 0242  AST 15 15  ALT 14 14  ALKPHOS 54 59  BILITOT 0.2* 0.4  PROT 5.4* 5.2*  ALBUMIN 3.2* 3.1*   CBG: Recent Labs  Lab 04/29/21 1756 05/02/21 0738  GLUCAP 149*  107*    Discharge time spent: 35 minutes.  Signed: Jacquelin Hawking, MD [facility] 05/05/2021

## 2021-05-05 NOTE — Discharge Instructions (Signed)
[name], ? ?You were in the hospital with opioid overdose requiring Narcan. This improved relatively quickly. Unfortunately, while you were here, you developed severe alcohol withdrawal. With treatment, you have significantly improved. You have been provided with resources to consider rehabilitation for your substance use. I sincerely hope that you do well going forward and are able to succeed in your desire to quit your substance use. ? ?Jacquelin Hawking, MD ? ?

## 2021-05-23 ENCOUNTER — Other Ambulatory Visit: Payer: Self-pay

## 2021-05-23 ENCOUNTER — Encounter (HOSPITAL_COMMUNITY): Payer: Self-pay | Admitting: Emergency Medicine

## 2021-05-23 ENCOUNTER — Emergency Department (HOSPITAL_COMMUNITY)
Admission: EM | Admit: 2021-05-23 | Discharge: 2021-05-23 | Discharge status: Against Medical Advice | Payer: Self-pay | Attending: Emergency Medicine | Admitting: Emergency Medicine

## 2021-05-23 DIAGNOSIS — T401X1A Poisoning by heroin, accidental (unintentional), initial encounter: Secondary | ICD-10-CM | POA: Insufficient documentation

## 2021-05-23 DIAGNOSIS — F1910 Other psychoactive substance abuse, uncomplicated: Secondary | ICD-10-CM

## 2021-05-23 DIAGNOSIS — T887XXA Unspecified adverse effect of drug or medicament, initial encounter: Secondary | ICD-10-CM | POA: Insufficient documentation

## 2021-05-23 DIAGNOSIS — T402X5A Adverse effect of other opioids, initial encounter: Secondary | ICD-10-CM | POA: Insufficient documentation

## 2021-05-23 NOTE — ED Notes (Signed)
This RN walked out of another room to see patient walking out of EMS bay. Security encouraging patient to stay, pt proceeded to leave.  ?

## 2021-05-23 NOTE — ED Provider Notes (Signed)
?[facility]-EMERGENCY DEPT ?Provider Note ? ? ?CSN: [id number] ?Arrival date & time: 05/23/21  1733 ? ?  ? ?History ? ?Chief Complaint  ?Patient presents with  ? Drug Overdose  ? ? ?Brett House is a 36 y.o. male. ? ?HPI ?Patient states he swallowed a large bag of heroin, to avoid "going to prison."  He apparently was found near a retail establishment with decreased responsiveness.  He did not receive Narcan.  He spontaneously awoke and apparently at that point swallowed the bag.  He is here now, presenting by EMS for request of "take the bag out."  Patient is insistent.  He thinks that he swallowed at least a gram of heroin.  Security at the bedside.  Patient is repeatedly asking to have the heroin removed from his abdomen, and minimally listens to questions, and gives repeated answers. ?  ? ?Home Medications ?Prior to Admission medications   ?Medication Sig Start Date End Date Taking? Authorizing Provider  ?albuterol (VENTOLIN HFA) 108 (90 Base) MCG/ACT inhaler Inhale 1-2 puffs into the lungs every 4 (four) hours as needed for wheezing or shortness of breath. 04/08/21 05/08/21  Estella Husk, MD  ?FLUoxetine (PROZAC) 40 MG capsule Take 1 capsule (40 mg total) by mouth daily. 04/08/21   Estella Husk, MD  ?folic acid (FOLVITE) 1 MG tablet Take 1 tablet (1 mg total) by mouth daily. 05/05/21   Narda Bonds, MD  ?hydrOXYzine (ATARAX) 25 MG tablet Take 1 tablet (25 mg total) by mouth every 6 (six) hours as needed for anxiety. 04/08/21   Estella Husk, MD  ?Multiple Vitamin (MULTIVITAMIN WITH MINERALS) TABS tablet Take 1 tablet by mouth daily. 05/05/21   Narda Bonds, MD  ?nicotine (NICODERM CQ - DOSED IN MG/24 HOURS) 21 mg/24hr patch Place 1 patch (21 mg total) onto the skin daily. 04/09/21   Estella Husk, MD  ?risperiDONE (RISPERDAL) 2 MG tablet Take 1 tablet (2 mg total) by mouth at bedtime. 04/08/21   Estella Husk, MD  ?thiamine 100 MG tablet Take 1 tablet (100 mg total)  by mouth daily. 05/05/21   Narda Bonds, MD  ?   ? ?Allergies    ?Desyrel [trazodone] and Erythromycin   ? ?Review of Systems   ?Review of Systems ? ?Physical Exam ?Updated Vital Signs ?Pulse (!) 128   Resp (!) 24   SpO2 98%  ?Physical Exam ?Vitals and nursing note reviewed.  ?Constitutional:   ?   General: He is in acute distress.  ?   Appearance: He is well-developed. He is not ill-appearing or diaphoretic.  ?HENT:  ?   Head: Normocephalic and atraumatic.  ?   Right Ear: External ear normal.  ?   Left Ear: External ear normal.  ?Eyes:  ?   Conjunctiva/sclera: Conjunctivae normal.  ?   Pupils: Pupils are equal, round, and reactive to light.  ?Neck:  ?   Trachea: Phonation normal.  ?Cardiovascular:  ?   Rate and Rhythm: Normal rate.  ?Pulmonary:  ?   Effort: Pulmonary effort is normal.  ?Abdominal:  ?   General: There is no distension.  ?Musculoskeletal:     ?   General: Normal range of motion.  ?   Cervical back: Normal range of motion and neck supple.  ?Skin: ?   General: Skin is warm and dry.  ?Neurological:  ?   Mental Status: He is alert and oriented to person, place, and time.  ?  Cranial Nerves: No cranial nerve deficit.  ?   Sensory: No sensory deficit.  ?   Motor: No abnormal muscle tone.  ?   Coordination: Coordination normal.  ?Psychiatric:     ?   Attention and Perception: He is inattentive.     ?   Mood and Affect: Mood is anxious.     ?   Speech: He is communicative.     ?   Behavior: Behavior is agitated.     ?   Thought Content: Thought content is not paranoid or delusional.     ?   Cognition and Memory: Cognition is not impaired.     ?   Judgment: Judgment is impulsive and inappropriate.  ? ? ?ED Results / Procedures / Treatments   ?Labs ?(all labs ordered are listed, but only abnormal results are displayed) ?Labs Reviewed - No data to display ? ?EKG ?EKG Interpretation ? ?Date/Time:  Thursday May 23 2021 17:48:50 EDT ?Ventricular Rate:  102 ?PR Interval:  123 ?QRS Duration: 78 ?QT  Interval:  337 ?QTC Calculation: 439 ?R Axis:   58 ?Text Interpretation: Sinus tachycardia ST elev, probable normal early repol pattern Since last tracing Non-specific ST-t changes are present Otherwise no significant change Confirmed by Mancel Bale [phone]) on 05/23/2021 6:02:04 PM ? ?Radiology ?No results found. ? ?Procedures ?Procedures  ? ? ?Medications Ordered in ED ?Medications - No data to display ? ?ED Course/ Medical Decision Making/ A&P ?Clinical Course as of 05/23/21 1803  ?Thu May 23, 2021  ?1758 I requested a call back from gastroenterology regarding ingested a containing heroin.  Prior to my discussing this with gastroenterology, the patient eloped from the department. [EW]  ?  ?Clinical Course User Index ?[EW] Mancel Bale, MD  ? ?                        ?Medical Decision Making ?Patient presenting for evaluation of ingested narcotic that he intentionally swallowed to avoid being found with illegal drugs.  Apparently he was unresponsive at some point, when EMS was called.  He spontaneously awoke then swallowed a bag containing heroin by his report. ? ?Amount and/or Complexity of Data Reviewed ?Independent Historian:  ?   Details: He is a cogent historian ? ?Risk ?Risk Details: Patient eloped while he was being evaluated and monitored.  He left before I could consult GI for assistance. ? ? ? ? ? ? ? ? ? ? ?Final Clinical Impression(s) / ED Diagnoses ?Final diagnoses:  ?Substance abuse (HCC)  ? ? ?Rx / DC Orders ?ED Discharge Orders   ? ? None  ? ?  ? ? ?  ?Mancel Bale, MD ?05/23/21 1803 ? ?

## 2021-05-23 NOTE — ED Triage Notes (Signed)
BIBA ?Per EMS: Pt found passed out behind [employer]. Uncooperative with EMS and fire  ?Pt admits to doing 1 gram and a half of heroin  ?

## 2021-05-28 ENCOUNTER — Other Ambulatory Visit (HOSPITAL_COMMUNITY)
Admission: EM | Admit: 2021-05-28 | Discharge: 2021-05-31 | Disposition: A | Discharge status: Home / Self Care | Payer: No Payment, Other | Source: Home / Self Care | Attending: Psychiatry | Admitting: Psychiatry

## 2021-05-28 ENCOUNTER — Other Ambulatory Visit: Payer: Self-pay

## 2021-05-28 ENCOUNTER — Ambulatory Visit (HOSPITAL_COMMUNITY)
Admission: EM | Admit: 2021-05-28 | Discharge: 2021-05-28 | Disposition: A | Discharge status: Home / Self Care | Payer: No Payment, Other | Attending: Psychiatry | Admitting: Psychiatry

## 2021-05-28 DIAGNOSIS — F319 Bipolar disorder, unspecified: Secondary | ICD-10-CM | POA: Insufficient documentation

## 2021-05-28 DIAGNOSIS — R45851 Suicidal ideations: Secondary | ICD-10-CM | POA: Insufficient documentation

## 2021-05-28 DIAGNOSIS — F1910 Other psychoactive substance abuse, uncomplicated: Secondary | ICD-10-CM | POA: Insufficient documentation

## 2021-05-28 DIAGNOSIS — R450 Nervousness: Secondary | ICD-10-CM | POA: Insufficient documentation

## 2021-05-28 DIAGNOSIS — F1914 Other psychoactive substance abuse with psychoactive substance-induced mood disorder: Secondary | ICD-10-CM | POA: Insufficient documentation

## 2021-05-28 DIAGNOSIS — Z5902 Unsheltered homelessness: Secondary | ICD-10-CM | POA: Insufficient documentation

## 2021-05-28 DIAGNOSIS — Z20822 Contact with and (suspected) exposure to covid-19: Secondary | ICD-10-CM | POA: Insufficient documentation

## 2021-05-28 DIAGNOSIS — Z5989 Other problems related to housing and economic circumstances: Secondary | ICD-10-CM | POA: Insufficient documentation

## 2021-05-28 DIAGNOSIS — F17210 Nicotine dependence, cigarettes, uncomplicated: Secondary | ICD-10-CM | POA: Insufficient documentation

## 2021-05-28 DIAGNOSIS — R451 Restlessness and agitation: Secondary | ICD-10-CM | POA: Insufficient documentation

## 2021-05-28 DIAGNOSIS — F419 Anxiety disorder, unspecified: Secondary | ICD-10-CM | POA: Insufficient documentation

## 2021-05-28 DIAGNOSIS — Z5986 Financial insecurity: Secondary | ICD-10-CM | POA: Insufficient documentation

## 2021-05-28 DIAGNOSIS — Z79899 Other long term (current) drug therapy: Secondary | ICD-10-CM | POA: Insufficient documentation

## 2021-05-28 DIAGNOSIS — F1590 Other stimulant use, unspecified, uncomplicated: Secondary | ICD-10-CM | POA: Insufficient documentation

## 2021-05-28 DIAGNOSIS — F1994 Other psychoactive substance use, unspecified with psychoactive substance-induced mood disorder: Secondary | ICD-10-CM

## 2021-05-28 DIAGNOSIS — R4587 Impulsiveness: Secondary | ICD-10-CM | POA: Insufficient documentation

## 2021-05-28 DIAGNOSIS — H5712 Ocular pain, left eye: Secondary | ICD-10-CM | POA: Insufficient documentation

## 2021-05-28 DIAGNOSIS — Z560 Unemployment, unspecified: Secondary | ICD-10-CM | POA: Insufficient documentation

## 2021-05-28 DIAGNOSIS — Z602 Problems related to living alone: Secondary | ICD-10-CM | POA: Insufficient documentation

## 2021-05-28 DIAGNOSIS — F1020 Alcohol dependence, uncomplicated: Secondary | ICD-10-CM | POA: Insufficient documentation

## 2021-05-28 LAB — POCT URINE DRUG SCREEN - MANUAL ENTRY (I-SCREEN)
POC Amphetamine UR: POSITIVE — AB
POC Amphetamine UR: NOT DETECTED
POC Buprenorphine (BUP): NOT DETECTED
POC Buprenorphine (BUP): NOT DETECTED
POC Cocaine UR: NOT DETECTED
POC Cocaine UR: POSITIVE — AB
POC Marijuana UR: POSITIVE — AB
POC Marijuana UR: POSITIVE — AB
POC Methadone UR: NOT DETECTED
POC Methadone UR: NOT DETECTED
POC Methamphetamine UR: POSITIVE — AB
POC Methamphetamine UR: POSITIVE — AB
POC Morphine: POSITIVE — AB
POC Morphine: NOT DETECTED
POC Oxazepam (BZO): NOT DETECTED
POC Oxazepam (BZO): NOT DETECTED
POC Oxycodone UR: NOT DETECTED
POC Oxycodone UR: NOT DETECTED
POC Secobarbital (BAR): NOT DETECTED
POC Secobarbital (BAR): NOT DETECTED

## 2021-05-28 LAB — CBC WITH DIFFERENTIAL/PLATELET
Abs Immature Granulocytes: 0.03 10*3/uL (ref 0.00–0.07)
Basophils Absolute: 0 10*3/uL (ref 0.0–0.1)
Basophils Relative: 0 %
Eosinophils Absolute: 0.2 10*3/uL (ref 0.0–0.5)
Eosinophils Relative: 2 %
HCT: 44.4 % (ref 39.0–52.0)
Hemoglobin: 15.4 g/dL (ref 13.0–17.0)
Immature Granulocytes: 0 %
Lymphocytes Relative: 21 %
Lymphs Abs: 2.1 10*3/uL (ref 0.7–4.0)
MCH: 31 pg (ref 26.0–34.0)
MCHC: 34.7 g/dL (ref 30.0–36.0)
MCV: 89.3 fL (ref 80.0–100.0)
Monocytes Absolute: 0.9 10*3/uL (ref 0.1–1.0)
Monocytes Relative: 9 %
Neutro Abs: 6.9 10*3/uL (ref 1.7–7.7)
Neutrophils Relative %: 68 %
Platelets: 295 10*3/uL (ref 150–400)
RBC: 4.97 MIL/uL (ref 4.22–5.81)
RDW: 13.4 % (ref 11.5–15.5)
WBC: 10.1 10*3/uL (ref 4.0–10.5)
nRBC: 0 % (ref 0.0–0.2)

## 2021-05-28 LAB — POC SARS CORONAVIRUS 2 AG -  ED: SARS Coronavirus 2 Ag: NEGATIVE

## 2021-05-28 LAB — RESP PANEL BY RT-PCR (FLU A&B, COVID) ARPGX2
Influenza A by PCR: NEGATIVE
Influenza A by PCR: NEGATIVE
Influenza B by PCR: NEGATIVE
Influenza B by PCR: NEGATIVE
SARS Coronavirus 2 by RT PCR: NEGATIVE
SARS Coronavirus 2 by RT PCR: NEGATIVE

## 2021-05-28 LAB — COMPREHENSIVE METABOLIC PANEL
ALT: 17 U/L (ref 0–44)
AST: 20 U/L (ref 15–41)
Albumin: 3.9 g/dL (ref 3.5–5.0)
Alkaline Phosphatase: 74 U/L (ref 38–126)
Anion gap: 8 (ref 5–15)
BUN: 10 mg/dL (ref 6–20)
CO2: 24 mmol/L (ref 22–32)
Calcium: 9 mg/dL (ref 8.9–10.3)
Chloride: 107 mmol/L (ref 98–111)
Creatinine, Ser: 0.82 mg/dL (ref 0.61–1.24)
GFR, Estimated: >60 mL/min (ref >60)
Glucose, Bld: 99 mg/dL (ref 70–99)
Potassium: 3.7 mmol/L (ref 3.5–5.1)
Sodium: 139 mmol/L (ref 135–145)
Total Bilirubin: 1 mg/dL (ref 0.3–1.2)
Total Protein: 6.2 g/dL — ABNORMAL LOW (ref 6.5–8.1)

## 2021-05-28 LAB — TSH: TSH: 1.882 u[IU]/mL (ref 0.350–4.500)

## 2021-05-28 LAB — ETHANOL
Alcohol, Ethyl (B): <10 mg/dL (ref <10)
Alcohol, Ethyl (B): <10 mg/dL (ref <10)

## 2021-05-28 LAB — POC SARS CORONAVIRUS 2 AG: SARSCOV2ONAVIRUS 2 AG: NEGATIVE

## 2021-05-28 LAB — HEMOGLOBIN A1C
Hgb A1c MFr Bld: 4.8 % (ref 4.8–5.6)
Mean Plasma Glucose: 91.06 mg/dL

## 2021-05-28 MED ORDER — ADULT MULTIVITAMIN W/MINERALS CH
1.0000 | ORAL_TABLET | Freq: Every day | ORAL | Status: DC
  Administered 2021-05-29 – 2021-05-31 (×3): 1 via ORAL
  Filled 2021-05-28 (×3): qty 1

## 2021-05-28 MED ORDER — BACITRACIN-POLYMYXIN B 500-10000 UNIT/GM OP OINT
TOPICAL_OINTMENT | Freq: Four times a day (QID) | OPHTHALMIC | Status: DC
  Administered 2021-05-28 (×2): 1 via OPHTHALMIC
  Filled 2021-05-28 (×2): qty 3.5

## 2021-05-28 MED ORDER — ACETAMINOPHEN 325 MG PO TABS
650.0000 mg | ORAL_TABLET | Freq: Four times a day (QID) | ORAL | Status: DC | PRN
  Administered 2021-05-28 – 2021-05-29 (×2): 650 mg via ORAL
  Filled 2021-05-28 (×2): qty 2

## 2021-05-28 MED ORDER — KETOTIFEN FUMARATE 0.025 % OP SOLN
1.0000 [drp] | Freq: Once | OPHTHALMIC | Status: AC
  Administered 2021-05-28: 1 [drp] via OPHTHALMIC
  Filled 2021-05-28 (×2): qty 5

## 2021-05-28 MED ORDER — BACITRACIN-POLYMYXIN B 500-10000 UNIT/GM OP OINT
TOPICAL_OINTMENT | Freq: Four times a day (QID) | OPHTHALMIC | Status: DC
  Filled 2021-05-28: qty 3.5

## 2021-05-28 MED ORDER — ALUM & MAG HYDROXIDE-SIMETH 200-200-20 MG/5ML PO SUSP: 30.0000 mL | ORAL | Status: DC | PRN

## 2021-05-28 MED ORDER — ACETAMINOPHEN 325 MG PO TABS: 650.0000 mg | ORAL_TABLET | Freq: Four times a day (QID) | ORAL | Status: DC | PRN

## 2021-05-28 MED ORDER — HYDROXYZINE HCL 25 MG PO TABS
25.0000 mg | ORAL_TABLET | Freq: Four times a day (QID) | ORAL | Status: DC | PRN
  Administered 2021-05-29: 25 mg via ORAL
  Filled 2021-05-28: qty 1
  Filled 2021-05-28: qty 15

## 2021-05-28 MED ORDER — NICOTINE 21 MG/24HR TD PT24
21.0000 mg | MEDICATED_PATCH | Freq: Every day | TRANSDERMAL | Status: DC
  Administered 2021-05-28 – 2021-05-31 (×4): 21 mg via TRANSDERMAL
  Filled 2021-05-28 (×4): qty 1
  Filled 2021-05-28: qty 14

## 2021-05-28 MED ORDER — HYDROXYZINE HCL 25 MG PO TABS
25.0000 mg | ORAL_TABLET | Freq: Four times a day (QID) | ORAL | Status: DC | PRN
  Administered 2021-05-29: 25 mg via ORAL
  Filled 2021-05-28: qty 1

## 2021-05-28 MED ORDER — LORAZEPAM 1 MG PO TABS: 1.0000 mg | ORAL_TABLET | Freq: Four times a day (QID) | ORAL | Status: DC | PRN

## 2021-05-28 MED ORDER — ONDANSETRON 4 MG PO TBDP: 4.0000 mg | ORAL_TABLET | Freq: Four times a day (QID) | ORAL | Status: DC | PRN

## 2021-05-28 MED ORDER — MAGNESIUM HYDROXIDE 400 MG/5ML PO SUSP: 30.0000 mL | Freq: Every day | ORAL | Status: DC | PRN

## 2021-05-28 MED ORDER — DICYCLOMINE HCL 20 MG PO TABS: 20.0000 mg | ORAL_TABLET | Freq: Four times a day (QID) | ORAL | Status: DC | PRN

## 2021-05-28 MED ORDER — HYDROXYZINE HCL 25 MG PO TABS: 25.0000 mg | ORAL_TABLET | Freq: Three times a day (TID) | ORAL | Status: DC | PRN

## 2021-05-28 MED ORDER — NAPHAZOLINE-GLYCERIN 0.012-0.25 % OP SOLN
1.0000 [drp] | Freq: Four times a day (QID) | OPHTHALMIC | Status: DC | PRN
  Filled 2021-05-28: qty 15

## 2021-05-28 MED ORDER — BACITRACIN-POLYMYXIN B 500-10000 UNIT/GM OP OINT: TOPICAL_OINTMENT | Freq: Four times a day (QID) | OPHTHALMIC | 0 refills | Status: DC

## 2021-05-28 MED ORDER — METHOCARBAMOL 500 MG PO TABS: 500.0000 mg | ORAL_TABLET | Freq: Three times a day (TID) | ORAL | Status: DC | PRN

## 2021-05-28 MED ORDER — LOPERAMIDE HCL 2 MG PO CAPS: 2.0000 mg | ORAL_CAPSULE | ORAL | Status: DC | PRN

## 2021-05-28 MED ORDER — FLUOXETINE HCL 20 MG PO CAPS
40.0000 mg | ORAL_CAPSULE | Freq: Every day | ORAL | Status: DC
  Administered 2021-05-29 – 2021-05-31 (×3): 40 mg via ORAL
  Filled 2021-05-28 (×2): qty 2
  Filled 2021-05-28: qty 28
  Filled 2021-05-28: qty 2

## 2021-05-28 MED ORDER — NAPROXEN 500 MG PO TABS: 500.0000 mg | ORAL_TABLET | Freq: Two times a day (BID) | ORAL | Status: DC | PRN

## 2021-05-28 MED ORDER — IBUPROFEN 600 MG PO TABS
600.0000 mg | ORAL_TABLET | Freq: Four times a day (QID) | ORAL | Status: DC | PRN
  Administered 2021-05-28 – 2021-05-29 (×2): 600 mg via ORAL
  Filled 2021-05-28 (×2): qty 1

## 2021-05-28 NOTE — ED Provider Notes (Signed)
Behavioral Health Admission H&P ?(FBC & OBS) ? ?Date: 05/28/21 ?Patient Name: Brett House ?MRN: [id number] ?Chief Complaint:  ?Chief Complaint  ?Patient presents with  ? Addiction Problem  ?  36 [facility] reporting he was seen earlier today (per chart review 0035) and he left because he had court today. Report he wants help with his drugs and alcohol (alcohol, crack, meth). Report alcohol is primary substance drank everyday, report he abuses crack daily and relapsed on meth not daily but almost. Last use yesterday before midnight. Report SI triggered by  feelings of hopeless, worthless, and homeless. Denied homicidal ideations and denied auditory/visual hallucinations.   ?   ? ?Diagnoses:  ?Final diagnoses:  ?Substance induced mood disorder (HCC)  ?Polysubstance abuse (HCC)  ? ? ?NWG:NFAOZHYHPI:[facility] as [facility] in requesting substance abuse treatment. ? ?Brett House, 36 y.o., male patient seen face to face by this provider, consulted with Dr. Bronwen BettersLaubach; and chart reviewed on 05/28/21.  Per chart review patient is well-known to the psychiatric service.  He has had multiple admissions to the local emergency department and [facility].Per chart review patient has a history of bipolar 1 disorder, SI, depression alcohol use disorder, and polysubstance abuse.  He has no outpatient services in place.  ? ?Patient was seen earlier today and discharged to resolve pending legal charges.  Patient is representing with increased depression, SI, and requesting substance abuse treatment.  He provides this Clinical research associatewriter with a card for [employer] public defender [phone].  With patient's permission Ms. Dorothyann Gibbseely was contacted.  She states that today was an error on the courts part.  States patient has taking care of of the previous charge.  She assures this Clinical research associatewriter that there are no pending legal [place] in [place] at this time. ? ?Patient reports before he initially  [facility] he was drinking 1/5 of a pint of liquor per day and multiple beers throughout the day.  Reports using meth and marijuana. Initial UDS was positive for amphetamines, methamphetamines, morphine, and marijuana.  This readmission UDS positive for cocaine, methamphetamines, and marijuana.  Patient reports he did not use any substances between discharge and readmission.  His BAL is less than 10.  He reports his substance of choice is alcohol and he is requesting detox.  Patient is interested in residential substance abuse treatment.  Reports he would like [place] [facility] [place] [facility] [place]. ? ?During evaluation Brett House  alert/oriented x 4. He  is speaking in a clear tone at moderate volume, and normal pace; with fair eye contact.  He reports he is homeless and admits that that increases his depression.  He reports she decreased sleep no concerns with appetite except for lack of food.  He denies AVH and HI.  He endorses suicidal ideations currently without any intent or current plan.  However, he states "this is no way to live I am just done with this, life and living this way".  When asked to elaborate he references substance abuse.  Patient states he wants to stop using drugs.  ? ? ?PHQ 2-9:  ?Flow[facility]  ?Thoughts that you would be better off dead, or of hurting yourself in some way Nearly every day  ?PHQ-9 Total Score 22  ? ?  ?  ?Flowsheet Row [facility] from 05/23/2021 in  [facility] to Hosp-Admission (Discharged) from 04/29/2021 in [facility]  ?C-SSRS RISK CATEGORY High Risk Error: Q3, 4, or 5 should not be populated when Q2 is No No Risk  ? ?  ?  ? ?Total Time spent with patient: 30 minutes ? ?Musculoskeletal  ?Strength & Muscle Tone: within normal limits ?Gait & Station: normal ?Patient leans: N/A ? ?Psychiatric  Specialty Exam  ?Presentation ?General Appearance: Appropriate for Environment; Casual ? ?Eye Contact:Fair ? ?Speech:Clear and Coherent; Normal Rate ? ?Speech Volume:Normal ? ?Handedness:Right ? ? ?Mood and Affect  ?Mood:Anxious; Depressed ? ?Affect:Congruent ? ? ?Thought Process  ?Thought Processes:Coherent ? ?Descriptions of Associations:Intact ? ?Orientation:Full (Time, Place and Person) ? ?Thought Content:Logical ? Diagnosis of Schizophrenia or Schizoaffective disorder in past: No ?  ?Hallucinations:Hallucinations: None ? ?Ideas of Reference:None ? ?Suicidal Thoughts:Suicidal Thoughts: Yes, Passive ?SI Active Intent and/or Plan: With Intent ?SI Passive Intent and/or Plan: Without Intent; Without Plan; Without Means to Carry Out ? ?Homicidal Thoughts:Homicidal Thoughts: No ? ? ?Sensorium  ?Memory:Immediate Good; Recent Good; Remote Good ? ?Judgment:Fair ? ?Insight:Good ? ? ?Executive Functions  ?Concentration:Good ? ?Attention Span:Good ? ?Recall:Good ? ?Fund of Knowledge:Good ? ?Language:Good ? ? ?Psychomotor Activity  ?Psychomotor Activity:Psychomotor Activity: Normal ? ? ?Assets  ?Assets:Communication Skills; Desire for Improvement; Physical Health; Resilience; Social Support ? ? ?Sleep  ?Sleep:Sleep: Fair ? ? ?Nutritional Assessment (For OBS and FBC admissions only) ?Has the patient had a weight loss or gain of 10 pounds or more in the last 3 months?: No ?Has the patient had a decrease in food intake/or appetite?: No ?Does the patient have dental problems?: No ?Does the patient have eating habits or behaviors that may be indicators of an eating disorder including binging or inducing vomiting?: No ?Has the patient recently lost weight without trying?: 0 ?Has the patient been eating poorly because of a decreased appetite?: 0 ?Malnutrition Screening Tool Score: 0 ? ? ? ?Physical Exam ?Vitals and nursing note reviewed.  ?Constitutional:   ?   General: He is not in acute distress. ?   Appearance: He is  well-developed.  ?HENT:  ?   Head: Normocephalic and atraumatic.  ?Eyes:  ?   Conjunctiva/sclera: Conjunctivae normal.  ?   Comments: Left eye is covered   ?Cardiovascular:  ?   Rate and Rhythm: Normal rate.  ?Pulmonary:  ?   Effort: Pulmonary effort is normal.  ?Musculoskeletal:     ?   General: No swelling.  ?   Cervical back: Normal range of motion.  ?Skin: ?   Coloration: Skin is not jaundiced or pale.  ?Neurological:  ?   Mental Status: He is alert.  ?Psychiatric:     ?   Attention and Perception: Attention and perception normal.     ?   Mood and Affect: Mood is anxious and depressed.     ?   Speech: Speech normal.     ?   Behavior: Behavior is agitated.     ?   Thought Content: Thought content includes suicidal ideation. Thought content does not include suicidal plan.     ?   Judgment: Judgment is impulsive.  ? ?Review of Systems  ?Constitutional: Negative.   ?HENT: Negative.    ?Eyes: Negative.   ?Respiratory: Negative.    ?Cardiovascular: Negative.   ?Genitourinary: Negative.   ?Musculoskeletal: Negative.   ?Skin: Negative.   ?Psychiatric/Behavioral:  Positive for substance abuse and suicidal ideas. The patient is nervous/anxious.   ? ?Blood  pressure (!) 146/97, pulse 78, temperature 98.2 ?F (36.8 ?C), temperature source Oral, resp. rate 18, SpO2 100 %. There is no height or weight on file to calculate BMI. ? ?Past Psychiatric History:Per chart review patient has a history of bipolar 1 disorder, SI, depression alcohol use disorder, and polysubstance abuse.  ? ?Is the patient at risk to self? Yes  ?Has the patient been a risk to self in the past 6 months? Yes .    ?Has the patient been a risk to self within the distant past? No   ?Is the patient a risk to others? No   ?Has the patient been a risk to others in the past 6 months? No   ?Has the patient been a risk to others within the distant past? No  ? ?Past Medical History:  ?Past Medical History:  ?Diagnosis Date  ? Anxiety   ? Asthma   ? Bipolar disorder  (HCC)   ? Depression   ? No past surgical history on file. ? ?Family History: No family history on file. ? ?Social History:  ?Social History  ? ?Socioeconomic History  ? Marital status: Single  ?  Spouse name: Not on

## 2021-05-28 NOTE — ED Provider Notes (Signed)
Behavioral Health Admission H&P ?(FBC & OBS) ? ?Date: 05/28/21 ?Patient Name: Brett House ?MRN: [id number] ?Chief Complaint:  ?Chief Complaint  ?Patient presents with  ? Addiction Problem  ? Suicidal  ?   ? ?Diagnoses:  ?Final diagnoses:  ?Substance abuse (HCC)  ?Suicidal ideation  ? ? ?HPI: Brett House 36 y.o male,  present to [facility] for suicidal plan,  staying he plan to jump in front of the train.  Per the patient he is homeless, and hang out closed to [facility] area.  Pt has history of SI,  depression, Bipolar I and substance abuse.   ? ?Observation of patient,  alert and oriented x 4,  speech coherent,  mood suggestive of under the influence, maintain eye contact.  Per the pt want help,  stating he want to change his life around.  ? Pt report he drinks 1/5  and a pint of alcohol.  Pt state he only drink seven beers today and he did some meth.  Pt state [place] is [place],  but move up to [place] with is then fiancee.   Denies any HI,  AVH,  paranoia or delusion.  State he want to go to [facility] for rehab.  "Im done with his kind life, I just want to start back my medications". According to pt he has no legal problem to prevent him from getting back on tract,  and therefore he want to get back on his medication and get his life back together.  ? ?Recommend inpatient Observation  ? ?PHQ 2-9:  ?Flowsheet Row ED from 05/28/2021 in [facility]  ?Thoughts that you would be better off dead, or of hurting yourself in some way Nearly every day  ?PHQ-9 Total Score 22  ? ?  ?  ?Flowsheet Row ED from 05/28/2021 in [facility] ED from 05/23/2021 in [facility] DEPT ED to Hosp-Admission (Discharged) from 04/29/2021 in [facility]  ?C-SSRS RISK CATEGORY High Risk Error: Q3, 4, or 5 should not be populated when Q2 is No No Risk  ? ?  ?  ? ?Total Time spent with patient: 20 minutes ? ?Musculoskeletal   ?Strength & Muscle Tone: within normal limits ?Gait & Station: normal ?Patient leans: N/A ? ?Psychiatric Specialty Exam  ?Presentation ?General Appearance: Appropriate for Environment ? ?Eye Contact:Fair ? ?Speech:Clear and Coherent ? ?Speech Volume:Normal ? ?Handedness:Ambidextrous ? ? ?Mood and Affect  ?Mood:Anxious; Hopeless; Worthless ? ?Affect:Blunt ? ? ?Thought Process  ?Thought Processes:Coherent ? ?Descriptions of Associations:Circumstantial ? ?Orientation:Full (Time, Place and Person) ? ?Thought Content:Abstract Reasoning ? Diagnosis of Schizophrenia or Schizoaffective disorder in past: No ?  ?Hallucinations:Hallucinations: None ? ?Ideas of Reference:None ? ?Suicidal Thoughts:Suicidal Thoughts: Yes, Active ?SI Active Intent and/or Plan: With Intent ? ?Homicidal Thoughts:Homicidal Thoughts: No ? ? ?Sensorium  ?Memory:Immediate Fair ? ?Judgment:Poor ? ?Insight:Poor ? ? ?Executive Functions  ?Concentration:Poor ? ?Attention Span:Fair ? ?Recall:Fair ? ?Fund of Knowledge:Fair ? ?Language:Fair ? ? ?Psychomotor Activity  ?Psychomotor Activity:No data recorded ? ?Assets  ?Assets:Communication Skills; Desire for Improvement; Physical Health; Resilience ? ? ?Sleep  ?Sleep:Sleep: Poor ? ? ?Nutritional Assessment (For OBS and FBC admissions only) ?Has the patient had a weight loss or gain of 10 pounds or more in the last 3 months?: No ?Has the patient had a decrease in food intake/or appetite?: No ?Does the patient have dental problems?: No ?Does the patient have eating habits or behaviors that may be indicators of an eating disorder  including binging or inducing vomiting?: No ?Has the patient recently lost weight without trying?: 0 ?Has the patient been eating poorly because of a decreased appetite?: 0 ?Malnutrition Screening Tool Score: 0 ? ? ? ?Physical Exam ?HENT:  ?   Head: Normocephalic.  ?   Nose: Nose normal.  ?Cardiovascular:  ?   Rate and Rhythm: Normal rate.  ?Pulmonary:  ?   Effort: Pulmonary effort is  normal.  ?Musculoskeletal:     ?   General: Normal range of motion.  ?   Cervical back: Normal range of motion.  ?Skin: ?   General: Skin is warm.  ?Neurological:  ?   General: No focal deficit present.  ?   Mental Status: He is alert.  ?Psychiatric:     ?   Mood and Affect: Mood normal.     ?   Behavior: Behavior normal.     ?   Thought Content: Thought content normal.     ?   Judgment: Judgment normal.  ? ?Review of Systems  ?Constitutional: Negative.   ?HENT: Negative.    ?Eyes: Negative.   ?Respiratory: Negative.    ?Cardiovascular: Negative.   ?Gastrointestinal: Negative.   ?Genitourinary: Negative.   ?Musculoskeletal: Negative.   ?Skin: Negative.   ?Neurological: Negative.   ?Endo/Heme/Allergies: Negative.   ?Psychiatric/Behavioral:  Positive for depression, substance abuse and suicidal ideas. The patient is nervous/anxious.   ? ?Blood pressure 125/86, pulse 96, temperature 98 ?F (36.7 ?C), temperature source Oral, resp. rate 18, SpO2 99 %. There is no height or weight on file to calculate BMI. ? ?Past Psychiatric History: substance abuse, SI, Bipolar I,  Depression, anxiety ? ?Is the patient at risk to self? Yes  ?Has the patient been a risk to self in the past 6 months? Yes .    ?Has the patient been a risk to self within the distant past? Yes   ?Is the patient a risk to others? No   ?Has the patient been a risk to others in the past 6 months? No   ?Has the patient been a risk to others within the distant past? No  ? ?Past Medical History:  ?Past Medical History:  ?Diagnosis Date  ? Anxiety   ? Asthma   ? Bipolar disorder (HCC)   ? Depression   ? No past surgical history on file. ? ?Family History: No family history on file. ? ?Social History:  ?Social History  ? ?Socioeconomic History  ? Marital status: Single  ?  Spouse name: Not on file  ? Number of children: 0  ? Years of education: Not on file  ? Highest education level: 12th grade  ?Occupational History  ? Occupation: Unemployed  ?Tobacco Use  ?  Smoking status: Every Day  ?  Packs/day: 1.50  ?  Years: 15.00  ?  Pack years: 22.50  ?  Types: Cigarettes  ? Smokeless tobacco: Current  ?Vaping Use  ? Vaping Use: Never used  ?Substance and Sexual Activity  ? Alcohol use: Yes  ?  Comment: 6 pack once a week for last 8-9 months  ? Drug use: Yes  ?  Types: Marijuana  ?  Comment: rarely  ? Sexual activity: Yes  ?Other Topics Concern  ? Not on file  ?Social History Narrative  ? Not on file  ? ?Social Determinants of Health  ? ?Financial Resource Strain: Not on file  ?Food Insecurity: Not on file  ?Transportation Needs: Not on file  ?Physical Activity: Not on  file  ?Stress: Not on file  ?Social Connections: Not on file  ?Intimate Partner Violence: Not on file  ? ? ?SDOH:  ?SDOH Screenings  ? ?Alcohol Screen: Low Risk   ? Last Alcohol Screening Score (AUDIT): 0  ?Depression (PHQ2-9): Medium Risk  ? PHQ-2 Score: 22  ?Financial Resource Strain: Not on file  ?Food Insecurity: Not on file  ?Housing: Not on file  ?Physical Activity: Not on file  ?Social Connections: Not on file  ?Stress: Not on file  ?Tobacco Use: High Risk  ? Smoking Tobacco Use: Every Day  ? Smokeless Tobacco Use: Current  ? Passive Exposure: Not on file  ?Transportation Needs: Not on file  ? ? ?Last Labs:  ?Admission on 05/28/2021  ?Component Date Value Ref Range Status  ? SARS Coronavirus 2 by RT PCR 05/28/2021 NEGATIVE  NEGATIVE Final  ? Comment: (NOTE) ?SARS-CoV-2 target nucleic acids are NOT DETECTED. ? ?The SARS-CoV-2 RNA is generally detectable in upper respiratory ?specimens during the acute phase of infection. The lowest ?concentration of SARS-CoV-2 viral copies this assay can detect is ?138 copies/mL. A negative result does not preclude SARS-Cov-2 ?infection and should not be used as the sole basis for treatment or ?other patient management decisions. A negative result may occur with  ?improper specimen collection/handling, submission of specimen other ?than nasopharyngeal swab, presence of viral  mutation(s) within the ?areas targeted by this assay, and inadequate number of viral ?copies(<138 copies/mL). A negative result must be combined with ?clinical observations, patient history, and epidemiological ?infor

## 2021-05-28 NOTE — ED Notes (Signed)
Pt resting with his eyes close, but is moving his feet from side to side for comfort. Will continue to monitor for safety ?

## 2021-05-28 NOTE — ED Notes (Signed)
New PCR COVID swab obtained, sent to [facility] lab. ?

## 2021-05-28 NOTE — ED Notes (Signed)
Dash called for stat pickup   
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
## 2021-05-28 NOTE — ED Notes (Signed)
Pt given AVS and bus pass.  All his belongings were returned to him.  Pt also got a note.  ?

## 2021-05-28 NOTE — ED Notes (Signed)
Patch removed from L eye. ?

## 2021-05-28 NOTE — ED Notes (Addendum)
Call to [facility] lab to see if they have received a 2nd PCR COVID test for today (pt left [facility] and then came back). Lab states they see an order, but no 2nd PCR COVID test was received. Pt on [facility] pending PCR COVID results to transfer to [facility]. Wyvonnia Dusky, RN/AC made aware.  ?

## 2021-05-28 NOTE — ED Notes (Signed)
Pt was searched and skin assessment performed. Pt was brought onto obs unit until covid test results.  Given sandwich .  Awaiting test results to move to [facility]. Pt is comfortable and in no current distress.    ?

## 2021-05-28 NOTE — ED Notes (Signed)
Pt awakens to vocal prompt.  He states that he " needs to sleep" and is irratable.  Pt was given eye medication as ordered.  Denies SI, HI or AVH. But states " I just am not feeling good right now"  I just woke up".   Pt was given breakfast and staff will continue to monitor for safety.  ?

## 2021-05-28 NOTE — ED Notes (Signed)
Pt resting at present, remains pending 2 hr Covid & transfer to [facility]. ?

## 2021-05-28 NOTE — ED Notes (Signed)
Attempted to irrigate Left eye with sterile saline, pt barely tolerated procedure.  NP [name] notified. ?

## 2021-05-28 NOTE — Discharge Instructions (Signed)
Substance Abuse Treatment Programs ? ?Intensive Outpatient Programs ?[place]     ?[place]      ?[place], [place]                   ?[phone]      ? ?[facility] ?[address] ?[place], [place] ?[phone] ? ?[facility]     ?(Inpatient and outpatient)     ?[address]           ?[phone]   ? ?[place] ?[phone] (Suboxone and Methadone) ? ?[address], [place] [address]      ?[phone]      ? ?[address] ?[place], [place] ?[place] ? ?[facility] (Outpatient/Inpatient, [facility])    ?(insurance only) [phone]      ?       ?[facility] (Groups & Residential) ?[place], [place] ?[phone] ? ?   ?[facility]     ?[place], [place]      ?[phone]      ? ?[facility] (for caregivers and family) ?[address] ?[place], [place] ?[phone] ? ? ? ? ? ?Residential Treatment Programs ?[place]      ?[address] [place], [place] [address]  ?[phone]      ? ?[facility] ?[address], [place] [address] ?[phone] ? ?[facility]        ?[phone]      ? ?[facility] ?[phone] ? ?[facility]             ?[place]                                         ?[place], [place]                                                ?[phone] or [phone]                              ? ?[facility] ?[address] ?[place], [address] ?[phone] ? ?[facility]    ?[place], [place]     ?[phone]      ? ?[facility] ?[place], [place] ?[phone] ? ?[place]   ?[place]     ?[place], [place] [address]     ?[phone]      ?Admissions: 8am-3pm M-F ? ?[facility] ([facility]) ?[address] ?[place],  [place] ?[phone] ? ?[facility]: Residential Program (90 Days)   ?[place], [place]      ?[phone] or [phone]    ? ?[facility]: [place], [place] ?(Walk in Hours over the weekend or by referral) ? ?[place] ?[place], [place],  [address] ?[phone] ? ?Crisis Mobile: [facility]:  [phone] (for crisis response 24 hours a day) ?[place] Hotline:      [phone] ?Outpatient Psychiatry and Counseling ? ?[facility]: Mobile Crisis  Management 24 hours:  [phone] ? ?[facility] [employer] sliding scale fee and walk in schedule: M-F 8am-12pm/1pm-3pm ?[place]  ?[place], [place] [address] ?[phone] ? ?[facility] ?[address] ?[place], [place] [address] ?[phone] ? ?[facility] ([facility])- new patient walk-in appointments available Monday - Friday 8am -3pm.          ?[address] ?[place], [place] [address] ?[phone] or crisis line- [phone] ? ?[facility] Intensive Outpatient Therapy Program ?[address] ?[place], [place] [address] ?[phone] ? ?[facility]                  ?Crisis Services      ?[phone]      ?[address]     ?[place], [place] [address]                ? ?[place]   ?[facility] ?[phone] ?[address] ?[place], [place] [address] ? ? ?[facility]          ?[address],  ?[place], [place] [address]       ?[phone] ? ?[facility] ?[address] ?[place], [place] [address] ?[phone] ? ?[facility]    ?[address]    ?[place], [place] [address]     ?[phone]      ? ?Sheralyn Boatman, MD     ?[address]     ?[place] [address]     ?[phone]     ?  ?[place] ?[place] [address] ? ?[facility]     ?[address]     ?[place], [place]      ?[phone]      ? ?[facility] ?Marlou Sa, MD ?[address] ?[place], [place] [address] ?[phone] ? ?[facility]     ?[address] [phone]     ?[place], [place] [address]     ?[phone]      ? ?[facility] ?[place], [place] [address] ?[phone] ?Resources for Temporary Residential Assistance/Crisis Centers ? ?[facility]ENT[facility] ?[facility]) ?M-F 8am-3pm   ?[address] [place], [place] [address]   [phone] ?Services include: laundry, barbering, support groups, case management, phone  & computer access, showers, AA/NA mtgs, mental health/substance abuse nurse, job skills class, disability information, VA assistance, spiritual classes, etc.  ? ?[facility] ? ?[employer]     ?[place]   ?[address], [place]     ?[phone]       ?       ?[facility] (women and children)       ?[place], [place] [address] ?[phone] ?[email or web address] for application and process ?Application Required ? ?[facility]   ?[address]    ?[place] [address]     ?[phone]       ?             ?[place] ?[address] ?[place], [place] [address] ?[phone] ?[phone](BXIDHWYS application appt.) ?Application Required ? ?[facility] (women only)    ?[place]     ?[place], [place] [address]     ?[phone]      ?  Intake starts 6pm daily ?Need valid ID, SSC, & Police report ?Holiday representative [employer] ?[address] ?[place], [place] ?[phone] ?Application Required ? ?[facility] (men only)     ?[address]      [place], [place]     ?[phone]      ? ?[facility] ?(Pregnant women only) ?[address] [place], [place] ?[phone] ? ?[facility]      ?[address]      [place], [place] [address]     ?[phone]      ?       ?[facility] ?[address] ?[place], [place] ?[phone] ?90 day commitment/SA/Application process ? ?[facility](men only)     ?[address]     ?[place], [place]     ?[phone]       ?Check-in at 7pm     ?       ?[facility] ?[address] ?[place], [place] [address] ?[phone] ?Men/Women/Women and Children must be there by 7 pm ? ?Holiday representative ?[place], [place] ?[phone]                ? ? ?Go to open access hours at the [facility] to establish services: ?  ?Walk in hours for medication management ?Monday, Wednesday, Thursday, and Friday from 8:00 AM to 11:00 AM ?Recommend arriving by by 7:15 AM.  It is first come first serve.   ?  ?Walk in hours for therapy intake ?Monday and Wednesday only 8:00 AM to 11:00 AM Encouraged to arrive by 7:30 AM. It is first come first serve. ?  ?Discharge recommendations:  ?Please see information for follow-up appointment with psychiatry and therapy. ?Please follow up with your primary care provider for all medical related needs.  ?  ?Therapy: We recommend that patient participate in individual therapy to address mental health concerns. ?  ?Safety:  ?The patient should abstain from use of illicit substances/drugs and abuse of any medications. ?If symptoms worsen or do not continue to improve or if the patient becomes actively suicidal or homicidal then it is recommended that the patient return to the closest hospital emergency department, the [facility], or call 911 for further evaluation and treatment. ?National Suicide Prevention Lifeline 1-800-SUICIDE or (740) 533-8600. ?  ?About 988 ?988 offers 24/7 access to trained crisis counselors who can help people experiencing mental health-related distress. People can call or text 988 or chat 988lifeline.org  for themselves or if they are worried about a loved one who may need crisis support.  ?  ? ?

## 2021-05-28 NOTE — ED Notes (Signed)
Pt was given cereal and milk for breakfast.  

## 2021-05-28 NOTE — ED Notes (Signed)
Pt resting quietly.  Breathing even and unlabored.  Pt is view of nursing station and is being observed.  No distress noted. Will continue to monitor for safety.  ?
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
## 2021-05-28 NOTE — BH Assessment (Signed)
Comprehensive Clinical Assessment (CCA) Note ? ?05/28/2021 ?Brett House ?[id number] ? ?Discharge Disposition: ?Brett Georges, NP, reviewed pt's chart and information and met with pt face-to-face and determined pt should receive continuous assessment and be re-assessed by psychiatry in the morning. Pt has been accepted at the [facility]. ? ?The patient demonstrates the following risk factors for suicide: Chronic risk factors for suicide include: psychiatric disorder of Bipolar 1 - depressed, substance use disorder, and previous suicide attempts most recently within the last 3 months . Acute risk factors for suicide include: family or marital conflict, unemployment, and social withdrawal/isolation. Protective factors for this patient include: hope for the future. Considering these factors, the overall suicide risk at this point appears to be high. Patient is not appropriate for outpatient follow up. ? ?Therefore, a 1:1 sitter is recommended for suicide precautions. ? ?Chief Complaint:  ?Chief Complaint  ?Patient presents with  ? Addiction Problem  ? Suicidal  ? ?Visit Diagnosis: Bipolar 1 - depression ? ?CCA Screening, Triage and Referral (STR) ?Brett House is a 36 year old patient who was brought to the [facility] by [facility] after pt called them and asked for help due to experiencing SI with a plan to get hit by a train. Pt states, "I'm trying to get help. I was supposed to go to [facility] but I couldn't due to having court. I was released from jail om March 10. I'm trying to do everything right. I was at [facility] from February 27 (2023) - March 5 (2023) due to an overdose. It was on purpose - I overdosed on heroin and fentanyl."  ? ?Pt endorses SI, stating he has been experiencing SI for 10 days. Pt states he stared at the trains for 2 hours, contemplating being hit by a trian, before calling the police. Pt denies HI, AVH, NSSIB, access to guns (though he endorses access to knives and razors), or engagement with the legal system. Pt  states he used crack cocaine, methamphetamine, and EtOH today. ? ?Pt is oriented x5. His recent/remote memory is intact. Pt was cooperative throughout the assessment process. Pt's insight, judgement, and impulse control is poor at this time. ? ?Patient Reported Information ?How did you hear about [place]? Self ? ?What Is the Reason for Your Visit/Call Today? Pt states, "I'm trying to get help. I was supposed to go to [facility] but I couldn't due to having court. I was released from jail om March 10. I'm trying to do everything right. I was at [facility] from February 27 (2023) - March 5 (2023) due to an overdose. It was on purpose - I overdosed on heroin and fentanyl." Pt endorses SI, stating he has been experiencing SI for 10 days. Pt states he stared at the trains for 2 hours, contemplating being hit by a trian, before calling the police. Pt denies HI, AVH, NSSIB, access to guns (though he endorses access to knives and razors), or engagement with the legal system. Pt states he used crack cocaine, methamphetamine, and EtOH today. ? ?How Long Has This Been Causing You Problems? 1-6 months ? ?What Do You Feel Would Help You the Most Today? Alcohol or Drug Use Treatment; Treatment for Depression or other mood problem; Medication(s) ? ? ?Have You Recently Had Any Thoughts About Hurting Yourself? Yes ? ?Are You Planning to Commit Suicide/Harm Yourself At This time? Yes ? ? ?Have you Recently Had Thoughts About [place]? No ? ?Are You Planning to Harm Someone at This Time? No ? ?Explanation: Pt reports  thoughts of harming unknown people who he believes are trying to harm him ? ? ?Have You Used Any Alcohol or Drugs in the Past 24 Hours? Yes ? ?How Long Ago Did You Use Drugs or Alcohol? No data recorded ?What Did You Use and How Much? Crack cocaine - smoked $20. Methamphetamine - snorted/smoked 1 gram. EtOH - 1/5 plus 1 pint of liquor. ? ? ?Do You Currently Have a Therapist/Psychiatrist? No ? ?Name of  Therapist/Psychiatrist: No data recorded ? ?Have You Been Recently Discharged From Any Office Practice or Programs? No ? ?Explanation of Discharge From Practice/Program: Pt reports he was inpatient at [facility] a couple of months ago ? ? ?  ?CCA Screening Triage Referral Assessment ?Type of Contact: Face-to-Face ? ?Telemedicine Service Delivery:   ?Is this Initial or Reassessment? Initial Assessment ? ?Date Telepsych consult ordered in [facility]:  04/04/21 ? ?Time Telepsych consult ordered in [facility]:  1702 ? ?Location of Assessment: [facility] Assessment Services ? ?Provider Location: [facility] Assessment Services ? ? ?Collateral Involvement: None at this time ? ? ?Does Patient Have a Stage manager Guardian? No data recorded ?Name and Contact of Legal Guardian: No data recorded ?If Minor and Not Living with Parent(s), Who has Custody? N/A ? ?Is CPS involved or ever been involved? Never ? ?Is APS involved or ever been involved? Never ? ? ?Patient Determined To Be At Risk for Harm To Self or Others Based on Review of Patient Reported Information or Presenting Complaint? Yes, for Self-Harm ? ?Method: Plan without intent ? ?Availability of Means: Has close by ? ?Intent: Vague intent or NA (pt has homicidal ideation towards people he doesn't know) ? ?Notification Required: No need or identified person ? ?Additional Information for Danger to Others Potential: Active psychosis ? ?Additional Comments for Danger to Others Potential: NA ? ?Are There Guns or Other Weapons in [place]? No (pt has friend with a firearm--pt wants to borrow it to kill himself) ? ?Types of Guns/Weapons: No data recorded ?Are These Weapons Safely Secured?                            No data recorded ?Who Could Verify You Are Able To Have These Secured: No data recorded ?Do You Have any Outstanding Charges, Pending Court Dates, Parole/Probation? none ? ?Contacted To Inform of Risk of Harm To Self or Others: Law Enforcement (LEO are aware) ? ? ? ?Does Patient  Present under Involuntary Commitment? No ? ?IVC Papers Initial File Date: No data recorded ? ?[place] of Residence: Kathleen Argue ? ? ?Patient Currently Receiving the Following Services: Not Receiving Services ? ? ?Determination of Need: Urgent (48 hours) ? ? ?Options For Referral: [facility]; Medication Management; Outpatient Therapy ? ? ? ? ?CCA Biopsychosocial ?Patient Reported Schizophrenia/Schizoaffective Diagnosis in Past: No ? ? ?Strengths: Pt is motivated for treatment; he states he currently has nothing holding him back and would like to get better. ? ? ?Mental Health Symptoms ?Depression:   ?Change in energy/activity; Difficulty Concentrating; Fatigue; Hopelessness; Increase/decrease in appetite; Irritability; Sleep (too much or little); Worthlessness ?  ?Duration of Depressive symptoms:  ?Duration of Depressive Symptoms: Greater than two weeks ?  ?Mania:   ?Change in energy/activity; Irritability; Racing thoughts; Increased Energy ?  ?Anxiety:    ?Tension; Worrying; Difficulty concentrating; Sleep; Fatigue ?  ?Psychosis:   ?None ?  ?Duration of Psychotic symptoms:    ?Trauma:   ?None ?  ?Obsessions:   ?  None ?  ?Compulsions:   ?None ?  ?Inattention:   ?None ?  ?Hyperactivity/Impulsivity:   ?N/A ?  ?Oppositional/Defiant Behaviors:   ?N/A ?  ?Emotional Irregularity:   ?Mood lability; Potentially harmful impulsivity; Recurrent suicidal behaviors/gestures/threats ?  ?Other Mood/Personality Symptoms:   ?None noted ?  ? ?Mental Status Exam ?Appearance and self-care  ?Stature:   ?Average ?  ?Weight:   ?Average weight ?  ?Clothing:   ?Casual ?  ?Grooming:   ?Normal ?  ?Cosmetic use:   ?None ?  ?Posture/gait:   ?Normal ?  ?Motor activity:   ?Not Remarkable ?  ?Sensorium  ?Attention:   ?Normal ?  ?Concentration:   ?Normal ?  ?Orientation:   ?X5 ?  ?Recall/memory:   ?Normal ?  ?Affect and Mood  ?Affect:   ?Anxious; Depressed ?  ?Mood:   ?Anxious; Depressed ?  ?Relating  ?Eye contact:   ?Fleeting ?  ?Facial expression:    ?Anxious; Depressed ?  ?Attitude toward examiner:   ?Cooperative ?  ?Thought and Language  ?Speech flow:  ?Normal ?  ?Thought content:   ?Appropriate to Mood and Circumstances ?  ?Preoccupation:   ?Suicide ?  ?[place]

## 2021-05-28 NOTE — Progress Notes (Signed)
?   05/28/21 1416  ?BHUC Triage Screening (Walk-ins at [facility] only)  ?How Did You Hear About [place]? Self  ?What Is the Reason for Your Visit/Call Today? 36 year old present to [facility] reporting he was seen earlier today (per chart review 0035) and he left because he had court today. Report he wants help with his drugs and alcohol (alcohol, crack, meth). Report alcohol is primary substance drank everyday, report he abuses crack daily and relapsed on meth not daily but almost. Last use yesterday before midnight. Report SI triggered by  feelings of hopeless, worthless, and homeless. Denied homicidal ideations and denied auditory/visual hallucinations. Patient report, "I got this court stuff behind me know I want help." Patient reports he wants to get sober.  ?How Long Has This Been Causing You Problems? > than 6 months ?(Report ongoing substance use and depressive symptoms.)  ?Have You Recently Had Any Thoughts About Hurting Yourself? Yes ?(Suicidal ideations with plan to jump in front of a train.)  ?How long ago did you have thoughts about hurting yourself? Currently  ?Are You Planning to Commit Suicide/Harm Yourself At This time? Yes ?(Report he sat on the train tracks today for 2 hours)  ?Have you Recently Had Thoughts About Hurting Someone [name]? No  ?Are You Planning To Harm Someone At This Time? No  ?Are you currently experiencing any auditory, visual or other hallucinations? No  ?Have You Used Any Alcohol or Drugs in the Past 24 Hours? Yes  ?How long ago did you use Drugs or Alcohol? Patient reported he abused crack cocaine, methamphetamine, and alcohol  ?What Did You Use and How Much? Crack cocaine-smoked $20, methamphetamine - snorted/smoked 1 gram, alcohol 1/5 plus 1 pint of liquor  ?Do you have any current medical co-morbidities that require immediate attention? No  ?Clinician description of patient physical appearance/behavior: Patient pleasant, cooperative and dressed appropriately for the weather  ?What Do You Feel  Would Help You the Most Today? Alcohol or Drug Use Treatment  ?If access to [facility] was not available, would you have sought care in the Emergency Department? Yes  ?Determination of Need Routine (7 days)  ?Options For Referral Other: Comment ?(Inpatient or outpatient substance abuse program)  ? ? ?

## 2021-05-29 DIAGNOSIS — Z5902 Unsheltered homelessness: Secondary | ICD-10-CM | POA: Diagnosis not present

## 2021-05-29 DIAGNOSIS — Z20822 Contact with and (suspected) exposure to covid-19: Secondary | ICD-10-CM | POA: Diagnosis not present

## 2021-05-29 DIAGNOSIS — F1914 Other psychoactive substance abuse with psychoactive substance-induced mood disorder: Secondary | ICD-10-CM | POA: Diagnosis not present

## 2021-05-29 DIAGNOSIS — R45851 Suicidal ideations: Secondary | ICD-10-CM | POA: Diagnosis not present

## 2021-05-29 MED ORDER — OFLOXACIN 0.3 % OP SOLN
1.0000 [drp] | Freq: Four times a day (QID) | OPHTHALMIC | Status: DC
  Administered 2021-05-29 – 2021-05-31 (×7): 1 [drp] via OPHTHALMIC
  Filled 2021-05-29: qty 5

## 2021-05-29 MED ORDER — CLONIDINE HCL 0.1 MG PO TABS
0.1000 mg | ORAL_TABLET | ORAL | Status: DC | PRN
  Administered 2021-05-29: 0.1 mg via ORAL
  Filled 2021-05-29: qty 1

## 2021-05-29 MED ORDER — ALBUTEROL SULFATE HFA 108 (90 BASE) MCG/ACT IN AERS
1.0000 | INHALATION_SPRAY | RESPIRATORY_TRACT | Status: DC | PRN
  Filled 2021-05-29: qty 8

## 2021-05-29 MED ORDER — OLANZAPINE 10 MG PO TABS
10.0000 mg | ORAL_TABLET | Freq: Every day | ORAL | Status: DC
  Administered 2021-05-29 – 2021-05-30 (×2): 10 mg via ORAL
  Filled 2021-05-29 (×2): qty 1
  Filled 2021-05-29: qty 14

## 2021-05-29 MED ORDER — KETOTIFEN FUMARATE 0.025 % OP SOLN
1.0000 [drp] | Freq: Every day | OPHTHALMIC | Status: DC
  Administered 2021-05-29 – 2021-05-31 (×3): 1 [drp] via OPHTHALMIC

## 2021-05-29 MED ORDER — DOXEPIN HCL 25 MG PO CAPS
25.0000 mg | ORAL_CAPSULE | Freq: Once | ORAL | Status: AC
  Administered 2021-05-29: 25 mg via ORAL
  Filled 2021-05-29: qty 1

## 2021-05-29 NOTE — Progress Notes (Signed)
Administered bacitracin -polymyxin ointment.  ?

## 2021-05-29 NOTE — Progress Notes (Signed)
Pt is resting quietly. No distress noted or concerns voiced. Staff will monitor for pt's safety. ?

## 2021-05-29 NOTE — Clinical Social Work Psych Note (Signed)
LCSW Intial Note  ? ? ?LCSW met with Brett House for introduction and to begin discussions regarding treatment and potential discharge planning.  ? ?Brett House reports that he came back to the [facility] seeking detox and residential substance abuse treatment services.  ? ?Brett House shared that since his last encounter on the [facility], which was back in February, he spent some time in jail. He reports he was released from jail on May 10, 2021, and relapsed shortly afterwards.  ? ?Brett House endorsed smoking crack-cocaine, methamphetamines and drinking alcohol on a daily basis, since 05/10/21. Brett House reports smoking 1 gram of crack-cocaine and 1 gram of methamphetamines on a daily basis. He also reports drinking a case of beer daily.  ? ?Brett House reports he continues to be interested in participating in a residential substance abuse treatment program, preferably [place].  ? ?LCSW referred Brett House and later learned that the patient had been accepted for a Screening for Admission appointment on Friday, 05/31/21 at 9:00am.  ? ?Dr. Serafina Mitchell, MD notified. ? ?LCSW will continue to follow for any additional questions or concerns.  ? ? ? ?Radonna Ricker, MSW, LCSW ?Clinical Education officer, museum Insurance claims handler) ?[facility]  ? ?

## 2021-05-29 NOTE — ED Notes (Addendum)
Pt requesting medication to help him sleep. Evette Georges, NP and Lindon Romp, NP made aware. ?

## 2021-05-29 NOTE — ED Notes (Signed)
Pt asleep in bed. Respirations even and unlabored. Will continue to monitor for safety. ?

## 2021-05-29 NOTE — ED Notes (Signed)
Pt admitted to [facility] due to SI with plan to jump in front of a train and substance use. Pt A&O x4, calm and cooperative. Pt denies current SI/HI/AVH and is able to verbally contract for safety. Pt tolerated skin assessment well. Admission paperwork already signed on first shift. Pt ambulated independently to unit. Oriented to unit/staff. Declined offer for food/drink at this time. No signs of acute distress noted. Will continue to monitor for safety.  ?
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
## 2021-05-29 NOTE — ED Notes (Addendum)
Patient declined to attend group due to him wanting to rest, even after encouragement from staff. Patient also stating that he needs to "rest his eyes'. ?

## 2021-05-29 NOTE — Progress Notes (Signed)
Pt is presently in bed resting quietly. Pt complained of eye pain. No signs of acute distress noted. PRN Advil and scheduled meds administered with no issue. Pt denies current SI/HI/AVH. Staff will monitor for pt's safety. ?

## 2021-05-29 NOTE — Progress Notes (Signed)
Pt is sleep. No distress noted or concerns voiced. Staff will monitor for pt's safety. ?

## 2021-05-29 NOTE — Progress Notes (Signed)
Pt refused bacitracin -polymyxin ointment. Writer explained to pt that it is an antibiotic and that it is necessary for him to use it to prevent infection. Pt is agreeable at this time. Will continue to monitor. ?

## 2021-05-29 NOTE — ED Notes (Signed)
Patient is resting quietly with eyes closed. No S/S of distress will continue to monitor for safety. ?

## 2021-05-29 NOTE — Progress Notes (Signed)
Pt's CIWA was 2 and COWS was 2. ?

## 2021-05-30 DIAGNOSIS — Z5902 Unsheltered homelessness: Secondary | ICD-10-CM | POA: Diagnosis not present

## 2021-05-30 DIAGNOSIS — R45851 Suicidal ideations: Secondary | ICD-10-CM | POA: Diagnosis not present

## 2021-05-30 DIAGNOSIS — F1914 Other psychoactive substance abuse with psychoactive substance-induced mood disorder: Secondary | ICD-10-CM | POA: Diagnosis not present

## 2021-05-30 DIAGNOSIS — Z20822 Contact with and (suspected) exposure to covid-19: Secondary | ICD-10-CM | POA: Diagnosis not present

## 2021-05-30 MED ORDER — NAPHAZOLINE-GLYCERIN 0.012-0.25 % OP SOLN
1.0000 [drp] | Freq: Four times a day (QID) | OPHTHALMIC | 0 refills | Status: DC | PRN
Start: 2021-05-30 — End: 2021-07-02

## 2021-05-30 MED ORDER — ALBUTEROL SULFATE HFA 108 (90 BASE) MCG/ACT IN AERS: 1.0000 | INHALATION_SPRAY | RESPIRATORY_TRACT | 1 refills | Status: DC | PRN

## 2021-05-30 MED ORDER — MELATONIN 3 MG PO TABS
3.0000 mg | ORAL_TABLET | Freq: Every evening | ORAL | Status: DC | PRN
  Filled 2021-05-30: qty 14

## 2021-05-30 MED ORDER — NICOTINE 21 MG/24HR TD PT24: 21.0000 mg | MEDICATED_PATCH | Freq: Every day | TRANSDERMAL | 1 refills | Status: AC

## 2021-05-30 MED ORDER — HYDROXYZINE HCL 25 MG PO TABS: 25.0000 mg | ORAL_TABLET | Freq: Four times a day (QID) | ORAL | 1 refills | Status: DC | PRN

## 2021-05-30 MED ORDER — KETOTIFEN FUMARATE 0.025 % OP SOLN: 1.0000 [drp] | Freq: Every day | OPHTHALMIC | 0 refills | Status: DC

## 2021-05-30 MED ORDER — OFLOXACIN 0.3 % OP SOLN: 1.0000 [drp] | Freq: Four times a day (QID) | OPHTHALMIC | 0 refills | Status: AC

## 2021-05-30 MED ORDER — MELATONIN 3 MG PO TABS: 3.0000 mg | ORAL_TABLET | Freq: Every evening | ORAL | 0 refills | Status: DC | PRN

## 2021-05-30 MED ORDER — FLUOXETINE HCL 40 MG PO CAPS: 40.0000 mg | ORAL_CAPSULE | Freq: Every day | ORAL | 1 refills | Status: DC

## 2021-05-30 MED ORDER — OLANZAPINE 10 MG PO TABS
10.0000 mg | ORAL_TABLET | Freq: Every day | ORAL | 1 refills | Status: DC
Start: 2021-05-30 — End: 2021-07-02

## 2021-05-30 NOTE — ED Notes (Signed)
Patient denies SI/HI at this time. Patient is willing to participate in group therapy. Patient reports his left eye feels better after he received his eye gtts.  ?

## 2021-05-30 NOTE — ED Notes (Addendum)
Patient reported his eye was no longer hurting. Patient denies SI/HI. Patient has been cooperative throughout his shift. Patient is scheduled to go to [facility] on 05/31/2021. Patient's medications, scripts, and cab voucher has been prepared for 05/31/2021's discharge.   ?

## 2021-05-30 NOTE — Discharge Instructions (Signed)

## 2021-05-30 NOTE — Group Note (Signed)
Group Topic: Relaxation  ?Group Date: 05/30/2021 ?Start Time: 1300 ?End Time: 1320 ?Facilitators: Laury Axon E  ?Department: [facility] ? ?Number of Participants: 2  ?Group Focus: relaxation ?Treatment Modality:  Solution-Focused Therapy ?Interventions utilized were group exercise and patient education ?Purpose: enhance coping skills ? ?Name: Brett House Date of Birth: [birth year]  ?MR: [id number]   ? ?Level of Participation: active ?Quality of Participation: attentive ?Interactions with others: gave feedback ?Mood/Affect: appropriate ?Triggers (if applicable): n/a ?Cognition: coherent/clear ?Progress: Moderate ?Response: n/a ?Plan: follow-up needed ? ?Patients Problems:  ?Patient Active Problem List  ? Diagnosis Date Noted  ? 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 Tobacco use 05/01/2021  ? Polysubstance abuse ([place]) 04/06/2021  ? Bipolar 1 disorder, depressed ([place]) 04/05/2021  ? Bipolar 1 disorder ([place]) 11/14/2020  ? Bipolar 1 disorder, depressed, severe ([place]) 10/12/2020  ? Suicidal thoughts   ? Substance induced mood disorder ([place]) 10/07/2020  ? 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 Bipolar disorder ([place]) 09/25/2020  ? Brief psychotic disorder ([place])   ? Suicidal ideation   ? Cocaine use disorder, mild, abuse ([place]) 01/27/2020  ? Marijuana abuse 01/27/2020  ? Bipolar I disorder, current episode depressed ([place]) 01/25/2020  ?  ?

## 2021-05-30 NOTE — ED Notes (Signed)
Patient declined to attend wrap up and AA group, even after encouragement from staff. ?

## 2021-05-30 NOTE — ED Provider Notes (Signed)
FBC/OBS ASAP Discharge Summary ? ?Date and Time: 05/30/2021 3:26 PM  ?Name: Brett HolsterBryan Schlegel  ?MRN:  [id number]  ? ?Discharge Diagnoses:  ?Final diagnoses:  ?Substance induced mood disorder (HCC)  ?Polysubstance abuse (HCC)  ?Bipolar I disorder, current episode depressed (HCC)  ? ? ?Subjective:  ?Patient seen and chart reviewed- he has been medication complaint and appropriate with staff and peers on the unit. Patient reports his mood as "alright". He denies SI/HI/AVH. He states that his eye is feeling much improved; objectively improved as well. He reports vision in his left eye is slightly blurry but attributes this to using opthalmic ointment prior to switching to eye drops. He reports sleeping well and denies issues with appetite. He denies all physical complaints and states that he is looking forward to attending rehab tomorrow as "I am tired of living like this". Support and encouragement provided. Patient is informed that he will be provided with 14 day samples and prescriptions upon discharge. Patient verbalized understanding. ? ? ?On my interview, patient is in NAD, alert, oriented, calm, cooperative, and attentive, with normal affect, speech, and behavior. Objectively, there is no evidence of psychosis/ mania (able to converse coherently, linear and goal directed thought, no RIS, no distractibility, not pre-occupied, no FOI, etc) nor depression to the point of suicidality (able to concentrate, affect full and reactive, speech normal r/v/t, no psychomotor retardation/agitation, etc). ? ?Overall, patient appears to be at the point, in the absence of inhibiting or disinhibiting symptoms, where he can successfully move to lesser restrictive setting for care. ? ?Stay Summary:  ?36 yo Male with history of bipolar disorder, substance use, AUD who [facility] seeking assistance with detox and passive SI. Patient had been discharged earlier in the day 3/28 to attend to a legal matter but returned for  detox after legal issues were resolved. Prior to admission, patient's public defender was contacted who affirmed that there are no pending court dates at this time in [place].  ?UDS+cocaine, methamphetamine, marijuana; etoh negative. Of note, patient's UDS from encounter earlier in the day was +amphetamine, methamphetamine, morphine and marijuana. Upon admission he was restarted on his home medication of fluoxetine as well as recently prescribed eye drops from [facility]. On 3/29 restarted home zyprexa 10 mg daily and switched opthalmic ointment to eyedrops die to inability to tolerate. Patient denied SI/HI/AVH/psychotic symptoms [facility] and was focused on getting onto rehab throughout his stay. Patient was [facility] on the  morning of 05/31/2021. ? ?Total Time spent with patient: 15 minutes ? ?Past Psychiatric History: bipolar disorder, substance use disorder ?Past Medical History:  ?Past Medical History:  ?Diagnosis Date  ? Anxiety   ? Asthma   ? Bipolar disorder (HCC)   ? Depression   ? No past surgical history on file. ?Family History: No family history on file. ?Family Psychiatric History: Patient has a family hx of mental health illnesses. States that he is unsure of the Dx's. Yet, recalls parents being hospitalized at psychiatric facilities. He also has a (paternal) cousin who made a successful suicide attempt ?Social History:  ?Social History  ? ?Substance and Sexual Activity  ?Alcohol Use Yes  ? Comment: 6 pack once a week for last 8-9 months  ?   ?Social History  ? ?Substance and Sexual Activity  ?Drug Use Yes  ? Types: Marijuana  ? Comment: rarely  ?  ?Social History  ? ?Socioeconomic History  ? Marital status: Single  ?  Spouse name: Not on file  ? Number of children: 0  ? Years of education: Not on file  ? Highest education level: 12th grade  ?Occupational History  ? Occupation: Unemployed  ?Tobacco Use  ? Smoking status: Every Day  ?  Packs/day: 1.50  ?  Years: 15.00  ?  Pack  years: 22.50  ?  Types: Cigarettes  ? Smokeless tobacco: Current  ?Vaping Use  ? Vaping Use: Never used  ?Substance and Sexual Activity  ? Alcohol use: Yes  ?  Comment: 6 pack once a week for last 8-9 months  ? Drug use: Yes  ?  Types: Marijuana  ?  Comment: rarely  ? Sexual activity: Yes  ?Other Topics Concern  ? Not on file  ?Social History Narrative  ? Not on file  ? ?Social Determinants of Health  ? ?Financial Resource Strain: Not on file  ?Food Insecurity: Not on file  ?Transportation Needs: Not on file  ?Physical Activity: Not on file  ?Stress: Not on file  ?Social Connections: Not on file  ? ?SDOH:  ?SDOH Screenings  ? ?Alcohol Screen: Low Risk   ? Last Alcohol Screening Score (AUDIT): 0  ?Depression (PHQ2-9): Medium Risk  ? PHQ-2 Score: 22  ?Financial Resource Strain: Not on file  ?Food Insecurity: Not on file  ?Housing: Not on file  ?Physical Activity: Not on file  ?Social Connections: Not on file  ?Stress: Not on file  ?Tobacco Use: High Risk  ? Smoking Tobacco Use: Every Day  ? Smokeless Tobacco Use: Current  ? Passive Exposure: Not on file  ?Transportation Needs: Not on file  ? ? ?Tobacco Cessation:  A prescription for an FDA-approved tobacco cessation medication provided at discharge ? ?Current Medications:  ?Current Facility-Administered Medications  ?Medication Dose Route Frequency Provider Last Rate Last Admin  ? acetaminophen (TYLENOL) tablet 650 mg  650 mg Oral Q6H PRN Ardis Hughs, NP   650 mg at 05/29/21 2216  ? albuterol (VENTOLIN HFA) 108 (90 Base) MCG/ACT inhaler 1-2 puff  1-2 puff Inhalation Q4H PRN Estella Husk, MD      ? alum & mag hydroxide-simeth (MAALOX/MYLANTA) 200-200-20 MG/5ML suspension 30 mL  30 mL Oral Q4H PRN Ardis Hughs, NP      ? cloNIDine (CATAPRES) tablet 0.1 mg  0.1 mg Oral Q4H PRN Estella Husk, MD   0.1 mg at 05/29/21 1723  ? dicyclomine (BENTYL) tablet 20 mg  20 mg Oral Q6H PRN Ardis Hughs, NP      ? FLUoxetine (PROZAC) capsule 40 mg  40  mg Oral Daily Vernard Gambles H, NP   40 mg at 05/30/21 8921  ? hydrOXYzine (ATARAX) tablet 25 mg  25 mg Oral Q6H PRN Ardis Hughs, NP   25 mg at 05/29/21 0145  ? hydrOXYzine (ATARAX) tablet 25 mg  25 mg Oral Q6H PRN Ardis Hughs, NP   25 mg at 05/29/21 2213  ? ibuprofen (ADVIL) tablet 600 mg  600 mg Oral Q6H PRN [name] A, NP   600 mg at 05/29/21 1941  ? ketotifen (ZADITOR) 0.025 % ophthalmic solution 1 drop  1 drop Left Eye Daily Estella Husk, MD   1 drop at 05/30/21 7408  ? loperamide (IMODIUM) capsule 2-4 mg  2-4 mg Oral PRN Ardis Hughs, NP      ? LORazepam (ATIVAN) tablet 1 mg  1 mg Oral Q6H PRN Ardis Hughs, NP      ?  magnesium hydroxide (MILK OF MAGNESIA) suspension 30 mL  30 mL Oral Daily PRN Ardis Hughs, NP      ? melatonin tablet 3 mg  3 mg Oral QHS PRN Estella Husk, MD      ? methocarbamol (ROBAXIN) tablet 500 mg  500 mg Oral Q8H PRN Ardis Hughs, NP      ? multivitamin with minerals tablet 1 tablet  1 tablet Oral Daily Ardis Hughs, NP   1 tablet at 05/30/21 0867  ? naphazoline-glycerin (CLEAR EYES REDNESS) ophth solution 1-2 drop  1-2 drop Both Eyes QID PRN [name] A, NP      ? nicotine (NICODERM CQ - dosed in mg/24 hours) patch 21 mg  21 mg Transdermal Daily Vernard Gambles H, NP   21 mg at 05/30/21 6195  ? ofloxacin (OCUFLOX) 0.3 % ophthalmic solution 1 drop  1 drop Left Eye QID Estella Husk, MD   1 drop at 05/30/21 1407  ? OLANZapine (ZYPREXA) tablet 10 mg  10 mg Oral QHS Estella Husk, MD   10 mg at 05/29/21 2210  ? ondansetron (ZOFRAN-ODT) disintegrating tablet 4 mg  4 mg Oral Q6H PRN Ardis Hughs, NP      ? ?Current Outpatient Medications  ?Medication Sig Dispense Refill  ? albuterol (VENTOLIN HFA) 108 (90 Base) MCG/ACT inhaler Inhale 1-2 puffs into the lungs every 4 (four) hours as needed for wheezing or shortness of breath. 1 each 1  ? FLUoxetine (PROZAC) 40 MG capsule Take 1 capsule (40 mg total) by  mouth daily. 30 capsule 1  ? hydrOXYzine (ATARAX) 25 MG tablet Take 1 tablet (25 mg total) by mouth every 6 (six) hours as needed for anxiety. 30 tablet 1  ? [START ON 05/31/2021] ketotifen (ZADITOR) 0.025 %

## 2021-05-30 NOTE — ED Notes (Signed)
Patient sleeping with no sxs of distress noted - will continue to monitor for safety 

## 2021-05-30 NOTE — ED Notes (Signed)
Patient continues to rest with no sxs of distress - will continue to monitor for safety ?

## 2021-05-30 NOTE — ED Notes (Signed)
Pt resting in bed. A&O x4, calm and cooperative. Denies current SI/HI/AVH. Denies any current needs. No signs of acute distress noted. Will continue to monitor for safety.  

## 2021-05-31 DIAGNOSIS — Z20822 Contact with and (suspected) exposure to covid-19: Secondary | ICD-10-CM | POA: Diagnosis not present

## 2021-05-31 DIAGNOSIS — R45851 Suicidal ideations: Secondary | ICD-10-CM | POA: Diagnosis not present

## 2021-05-31 DIAGNOSIS — Z5902 Unsheltered homelessness: Secondary | ICD-10-CM | POA: Diagnosis not present

## 2021-05-31 DIAGNOSIS — F1914 Other psychoactive substance abuse with psychoactive substance-induced mood disorder: Secondary | ICD-10-CM | POA: Diagnosis not present

## 2021-05-31 NOTE — ED Notes (Signed)
Pt asleep in bed. Respirations even and unlabored. Will continue to monitor for safety. ?

## 2021-05-31 NOTE — Discharge Summary (Signed)
[name] to be D/C'd  Floydene Flock  per MD order. Discussed with the patient and all questions fully answered. An After Visit Summary was printed and given to the patient. Medication samples and scripts were also given to patient. Patient escorted out and D/C via taxi cab.  ?Veneta Sliter  Marquis Lunch  ?05/31/2021 9:08 AM ?  ?   ?

## 2021-05-31 NOTE — Progress Notes (Signed)
Pt is awake, alert and oriented. Pt did not voice any complaints of pain or discomfort. No signs of acute distress noted. Administered scheduled meds with no issue. Pt denies current SI/HI/AVH. Staff will monitor for pt's safety. ?

## 2021-07-02 ENCOUNTER — Encounter: Payer: Self-pay | Admitting: Physician Assistant

## 2021-07-02 ENCOUNTER — Ambulatory Visit (INDEPENDENT_AMBULATORY_CARE_PROVIDER_SITE_OTHER): Payer: Self-pay | Admitting: Physician Assistant

## 2021-07-02 VITALS — BP 114/78 | HR 85 | Temp 98.2°F | Resp 18 | Ht 71.0 in | Wt 193.0 lb

## 2021-07-02 DIAGNOSIS — F1110 Opioid abuse, uncomplicated: Secondary | ICD-10-CM

## 2021-07-02 DIAGNOSIS — F1010 Alcohol abuse, uncomplicated: Secondary | ICD-10-CM

## 2021-07-02 DIAGNOSIS — F1410 Cocaine abuse, uncomplicated: Secondary | ICD-10-CM

## 2021-07-02 DIAGNOSIS — F1210 Cannabis abuse, uncomplicated: Secondary | ICD-10-CM

## 2021-07-02 DIAGNOSIS — F411 Generalized anxiety disorder: Secondary | ICD-10-CM

## 2021-07-02 DIAGNOSIS — Z720 Tobacco use: Secondary | ICD-10-CM

## 2021-07-02 DIAGNOSIS — J302 Other seasonal allergic rhinitis: Secondary | ICD-10-CM

## 2021-07-02 DIAGNOSIS — F319 Bipolar disorder, unspecified: Secondary | ICD-10-CM

## 2021-07-02 MED ORDER — HYDROXYZINE HCL 25 MG PO TABS: 25.0000 mg | ORAL_TABLET | Freq: Four times a day (QID) | ORAL | 2 refills | Status: AC | PRN

## 2021-07-02 MED ORDER — OLANZAPINE 10 MG PO TABS: 10.0000 mg | ORAL_TABLET | Freq: Every day | ORAL | 2 refills | Status: AC

## 2021-07-02 MED ORDER — ALBUTEROL SULFATE HFA 108 (90 BASE) MCG/ACT IN AERS: 1.0000 | INHALATION_SPRAY | RESPIRATORY_TRACT | 2 refills | Status: AC | PRN

## 2021-07-02 MED ORDER — MELATONIN 3 MG PO TABS: 3.0000 mg | ORAL_TABLET | Freq: Every evening | ORAL | 2 refills | Status: AC | PRN

## 2021-07-02 MED ORDER — NAPHAZOLINE-GLYCERIN 0.012-0.25 % OP SOLN: 1.0000 [drp] | Freq: Four times a day (QID) | OPHTHALMIC | 2 refills | Status: AC | PRN

## 2021-07-02 MED ORDER — FLUOXETINE HCL 40 MG PO CAPS: 40.0000 mg | ORAL_CAPSULE | Freq: Every day | ORAL | 2 refills | Status: AC

## 2021-07-02 NOTE — Progress Notes (Signed)
? ?New Patient Office Visit ? ?Subjective   ? ?Patient ID: Brett House, male    DOB: Dec 06, 1985  Age: 36 y.o. MRN: [id number] ? ?CC:  ?Chief Complaint  ?Patient presents with  ? Transitions Of Care  ? ? ?HPI ?Brett House reports that he is currently being treated for substance abuse [facility].  States that he arrived there on 05/31/21 ?reports that he is transferring [facility] in [facility]. ? ?States that he feels his moods are stable  ?Sleep is good, taking hydroxyzine and melatonin with relief ?Appetite is good  ? ?No other concerns at this time ? ?Outpatient Encounter Medications as of 07/02/2021  ?Medication Sig  ? nicotine (NICODERM CQ - DOSED IN MG/24 HOURS) 21 mg/24hr patch Place 1 patch (21 mg total) onto the skin daily.  ? [DISCONTINUED] albuterol (VENTOLIN HFA) 108 (90 Base) MCG/ACT inhaler Inhale 1-2 puffs into the lungs every 4 (four) hours as needed for wheezing or shortness of breath.  ? [DISCONTINUED] FLUoxetine (PROZAC) 40 MG capsule Take 1 capsule (40 mg total) by mouth daily.  ? [DISCONTINUED] hydrOXYzine (ATARAX) 25 MG tablet Take 1 tablet (25 mg total) by mouth every 6 (six) hours as needed for anxiety.  ? [DISCONTINUED] melatonin 3 MG TABS tablet Take 1 tablet (3 mg total) by mouth at bedtime as needed.  ? [DISCONTINUED] naphazoline-glycerin (CLEAR EYES REDNESS) 0.012-0.25 % SOLN Place 1-2 drops into both eyes 4 (four) times daily as needed for eye irritation.  ? [DISCONTINUED] OLANZapine (ZYPREXA) 10 MG tablet Take 1 tablet (10 mg total) by mouth at bedtime.  ? albuterol (VENTOLIN HFA) 108 (90 Base) MCG/ACT inhaler Inhale 1-2 puffs into the lungs every 4 (four) hours as needed for wheezing or shortness of breath.  ? FLUoxetine (PROZAC) 40 MG capsule Take 1 capsule (40 mg total) by mouth daily.  ? hydrOXYzine (ATARAX) 25 MG tablet Take 1 tablet (25 mg total) by mouth every 6 (six) hours as needed for anxiety.  ? melatonin 3 MG TABS tablet Take 1  tablet (3 mg total) by mouth at bedtime as needed.  ? naphazoline-glycerin (CLEAR EYES REDNESS) 0.012-0.25 % SOLN Place 1-2 drops into both eyes 4 (four) times daily as needed for eye irritation.  ? OLANZapine (ZYPREXA) 10 MG tablet Take 1 tablet (10 mg total) by mouth at bedtime.  ? [DISCONTINUED] ketotifen (ZADITOR) 0.025 % ophthalmic solution Place 1 drop into the left eye daily.  ? ?No facility-administered encounter medications on file as of 07/02/2021.  ? ? ?Past Medical History:  ?Diagnosis Date  ? Anxiety   ? Asthma   ? Bipolar disorder (HCC)   ? Depression   ? ? ?History reviewed. No pertinent surgical history. ? ?History reviewed. No pertinent family history. ? ?Social History  ? ?Socioeconomic History  ? Marital status: Single  ?  Spouse name: Not on file  ? Number of children: 0  ? Years of education: Not on file  ? Highest education level: 12th grade  ?Occupational History  ? Occupation: Unemployed  ?Tobacco Use  ? Smoking status: Former  ?  Packs/day: 1.50  ?  Years: 15.00  ?  Pack years: 22.50  ?  Types: Cigarettes  ? Smokeless tobacco: Former  ?Vaping Use  ? Vaping Use: Never used  ?Substance and Sexual Activity  ? Alcohol use: Not Currently  ?  Comment: 6 pack once a week for last 8-9 months  ? Drug use: Not Currently  ?  Types: Marijuana  ?  Comment: rarely  ? Sexual activity: Yes  ?Other Topics Concern  ? Not on file  ?Social History Narrative  ? Not on file  ? ?Social Determinants of Health  ? ?Financial Resource Strain: Not on file  ?Food Insecurity: Not on file  ?Transportation Needs: Not on file  ?Physical Activity: Not on file  ?Stress: Not on file  ?Social Connections: Not on file  ?Intimate Partner Violence: Not on file  ? ? ?Review of Systems  ?Constitutional: Negative.   ?HENT: Negative.    ?Eyes: Negative.   ?Respiratory:  Negative for shortness of breath.   ?Cardiovascular:  Negative for chest pain.  ?Gastrointestinal: Negative.   ?Genitourinary: Negative.   ?Musculoskeletal: Negative.    ?Skin: Negative.   ?Neurological: Negative.   ?Endo/Heme/Allergies: Negative.   ?Psychiatric/Behavioral:  Negative for depression and suicidal ideas. The patient is not nervous/anxious and does not have insomnia.   ? ?  ? ? ?Objective   ? ?BP 114/78 (BP Location: Left Arm, Patient Position: Sitting, Cuff Size: Normal)   Pulse 85   Temp 98.2 ?F (36.8 ?C) (Oral)   Resp 18   Ht 5\' 11"  (1.803 m)   Wt 193 lb (87.5 kg)   SpO2 95%   BMI 26.92 kg/m?  ? ?Physical Exam ?Vitals and nursing note reviewed.  ?Constitutional:   ?   Appearance: Normal appearance.  ?HENT:  ?   Head: Normocephalic and atraumatic.  ?   Right Ear: External ear normal.  ?   Left Ear: External ear normal.  ?   Nose: Nose normal.  ?   Mouth/Throat:  ?   Mouth: Mucous membranes are moist.  ?   Pharynx: Oropharynx is clear.  ?Eyes:  ?   Extraocular Movements: Extraocular movements intact.  ?   Conjunctiva/sclera: Conjunctivae normal.  ?   Pupils: Pupils are equal, round, and reactive to light.  ?Cardiovascular:  ?   Rate and Rhythm: Normal rate and regular rhythm.  ?   Pulses: Normal pulses.  ?   Heart sounds: Normal heart sounds.  ?Pulmonary:  ?   Effort: Pulmonary effort is normal.  ?   Breath sounds: Normal breath sounds.  ?Musculoskeletal:     ?   General: Normal range of motion.  ?   Cervical back: Normal range of motion and neck supple.  ?Skin: ?   General: Skin is warm and dry.  ?Neurological:  ?   General: No focal deficit present.  ?   Mental Status: He is alert and oriented to person, place, and time.  ?Psychiatric:     ?   Mood and Affect: Mood normal.     ?   Behavior: Behavior normal.     ?   Thought Content: Thought content normal.     ?   Judgment: Judgment normal.  ? ? ?Assessment & Plan:  ? ?Problem List Items Addressed This Visit   ? ?  ? Other  ? Cocaine use disorder, mild, abuse (HCC)  ? Marijuana abuse  ? Bipolar 1 disorder (HCC)  ? Relevant Medications  ? FLUoxetine (PROZAC) 40 MG capsule  ? hydrOXYzine (ATARAX) 25 MG tablet  ?  melatonin 3 MG TABS tablet  ? OLANZapine (ZYPREXA) 10 MG tablet  ? Other Relevant Orders  ? Comp. Metabolic Panel (12)  ? Tobacco use  ? GAD (generalized anxiety disorder) - Primary  ? Relevant Medications  ? FLUoxetine (PROZAC) 40 MG capsule  ? hydrOXYzine (ATARAX) 25  MG tablet  ? Heroin abuse (HCC)  ? Alcohol abuse  ? ?Other Visit Diagnoses   ? ? Seasonal allergies      ? Relevant Medications  ? naphazoline-glycerin (CLEAR EYES REDNESS) 0.012-0.25 % SOLN  ? albuterol (VENTOLIN HFA) 108 (90 Base) MCG/ACT inhaler  ? ?  ? ?1. Bipolar 1 disorder (HCC) ?Continue current regimen.  Patient education given on preventative health maintenance.  Red flags given for prompt reevaluation ?- Comp. Metabolic Panel (12) ?- FLUoxetine (PROZAC) 40 MG capsule; Take 1 capsule (40 mg total) by mouth daily.  Dispense: 30 capsule; Refill: 2 ?- hydrOXYzine (ATARAX) 25 MG tablet; Take 1 tablet (25 mg total) by mouth every 6 (six) hours as needed for anxiety.  Dispense: 30 tablet; Refill: 2 ?- melatonin 3 MG TABS tablet; Take 1 tablet (3 mg total) by mouth at bedtime as needed.  Dispense: 30 tablet; Refill: 2 ?- OLANZapine (ZYPREXA) 10 MG tablet; Take 1 tablet (10 mg total) by mouth at bedtime.  Dispense: 30 tablet; Refill: 2 ? ?2. GAD (generalized anxiety disorder) ? ? ?3. Seasonal allergies ?Continue current regimen ?- naphazoline-glycerin (CLEAR EYES REDNESS) 0.012-0.25 % SOLN; Place 1-2 drops into both eyes 4 (four) times daily as needed for eye irritation.  Dispense: 6 mL; Refill: 2 ?- albuterol (VENTOLIN HFA) 108 (90 Base) MCG/ACT inhaler; Inhale 1-2 puffs into the lungs every 4 (four) hours as needed for wheezing or shortness of breath.  Dispense: 1 each; Refill: 2 ? ?4. Cocaine use disorder, mild, abuse (HCC) ?Currently in substance abuse treatment program ? ?5. Marijuana abuse ? ? ?6. Tobacco use ? ? ?7. Heroin abuse (HCC) ? ? ?8. Alcohol abuse ? ? ? ?I have reviewed the patient's medical history (PMH, PSH, Social History, Family  History, Medications, and allergies) , and have been updated if relevant. I spent 30 minutes reviewing chart and  face to face time with patient. ? ? ? ?Return if symptoms worsen or fail to improve.  ? ?[place]

## 2021-07-02 NOTE — Patient Instructions (Signed)
We will call you with today's lab result. ? ?Roney Jaffe, PA-C ?Physician Assistant ?[facility] ?[email or web address] ? ? ?Health Maintenance, Male ?Adopting a healthy lifestyle and getting preventive care are important in promoting health and wellness. Ask your health care provider about: ?The right schedule for you to have regular tests and exams. ?Things you can do on your own to prevent diseases and keep yourself healthy. ?What should I know about diet, weight, and exercise? ?Eat a healthy diet ? ?Eat a diet that includes plenty of vegetables, fruits, low-fat dairy products, and lean protein. ?Do not eat a lot of foods that are high in solid fats, added sugars, or sodium. ?Maintain a healthy weight ?Body mass index (BMI) is a measurement that can be used to identify possible weight problems. It estimates body fat based on height and weight. Your health care provider can help determine your BMI and help you achieve or maintain a healthy weight. ?Get regular exercise ?Get regular exercise. This is one of the most important things you can do for your health. Most adults should: ?Exercise for at least 150 minutes each week. The exercise should increase your heart rate and make you sweat (moderate-intensity exercise). ?Do strengthening exercises at least twice a week. This is in addition to the moderate-intensity exercise. ?Spend less time sitting. Even light physical activity can be beneficial. ?Watch cholesterol and blood lipids ?Have your blood tested for lipids and cholesterol at 37 years of age, then have this test every 5 years. ?You may need to have your cholesterol levels checked more often if: ?Your lipid or cholesterol levels are high. ?You are older than 37 years of age. ?You are at high risk for heart disease. ?What should I know about cancer screening? ?Many types of cancers can be detected early and may often be prevented. Depending on your  health history and family history, you may need to have cancer screening at various ages. This may include screening for: ?Colorectal cancer. ?Prostate cancer. ?Skin cancer. ?Lung cancer. ?What should I know about heart disease, diabetes, and high blood pressure? ?Blood pressure and heart disease ?High blood pressure causes heart disease and increases the risk of stroke. This is more likely to develop in people who have high blood pressure readings or are overweight. ?Talk with your health care provider about your target blood pressure readings. ?Have your blood pressure checked: ?Every 3-5 years if you are 49-14 years of age. ?Every year if you are 22 years old or older. ?If you are between the ages of 2 and 9 and are a current or former smoker, ask your health care provider if you should have a one-time screening for abdominal aortic aneurysm (AAA). ?Diabetes ?Have regular diabetes screenings. This checks your fasting blood sugar level. Have the screening done: ?Once every three years after age 82 if you are at a normal weight and have a low risk for diabetes. ?More often and at a younger age if you are overweight or have a high risk for diabetes. ?What should I know about preventing infection? ?Hepatitis B ?If you have a higher risk for hepatitis B, you should be screened for this virus. Talk with your health care provider to find out if you are at risk for hepatitis B infection. ?Hepatitis C ?Blood testing is recommended for: ?Everyone born from [birth year] through 1966. ?Anyone with known risk factors for hepatitis C. ?Sexually transmitted infections (STIs) ?You should be screened each year for STIs, including gonorrhea and chlamydia, if: ?  You are sexually active and are younger than 37 years of age. ?You are older than 37 years of age and your health care provider tells you that you are at risk for this type of infection. ?Your sexual activity has changed since you were last screened, and you are at increased risk  for chlamydia or gonorrhea. Ask your health care provider if you are at risk. ?Ask your health care provider about whether you are at high risk for HIV. Your health care provider may recommend a prescription medicine to help prevent HIV infection. If you choose to take medicine to prevent HIV, you should first get tested for HIV. You should then be tested every 3 months for as long as you are taking the medicine. ?Follow these instructions at home: ?Alcohol use ?Do not drink alcohol if your health care provider tells you not to drink. ?If you drink alcohol: ?Limit how much you have to 0-2 drinks a day. ?Know how much alcohol is in your drink. In the [place] one drink equals one 12 oz bottle of beer (355 mL), one 5 oz glass of wine (148 mL), or one 1? oz glass of hard liquor (44 mL). ?Lifestyle ?Do not use any products that contain nicotine or tobacco. These products include cigarettes, chewing tobacco, and vaping devices, such as e-cigarettes. If you need help quitting, ask your health care provider. ?Do not use street drugs. ?Do not share needles. ?Ask your health care provider for help if you need support or information about quitting drugs. ?General instructions ?Schedule regular health, dental, and eye exams. ?Stay current with your vaccines. ?Tell your health care provider if: ?You often feel depressed. ?You have ever been abused or do not feel safe at home. ?Summary ?Adopting a healthy lifestyle and getting preventive care are important in promoting health and wellness. ?Follow your health care provider's instructions about healthy diet, exercising, and getting tested or screened for diseases. ?Follow your health care provider's instructions on monitoring your cholesterol and blood pressure. ?This information is not intended to replace advice given to you by your health care provider. Make sure you discuss any questions you have with your health care provider. ?Document Revised: 07/09/2020 Document Reviewed:  07/09/2020 ?Elsevier Patient Education ? 2023 [employer] ? ?

## 2021-07-03 LAB — COMP. METABOLIC PANEL (12)
AST: 18 IU/L (ref 0–40)
Albumin/Globulin Ratio: 2.3 — ABNORMAL HIGH (ref 1.2–2.2)
Albumin: 4.8 g/dL (ref 4.0–5.0)
Alkaline Phosphatase: 67 IU/L (ref 44–121)
BUN/Creatinine Ratio: 21 — ABNORMAL HIGH (ref 9–20)
BUN: 15 mg/dL (ref 6–20)
Bilirubin Total: 0.3 mg/dL (ref 0.0–1.2)
Calcium: 9.7 mg/dL (ref 8.7–10.2)
Chloride: 103 mmol/L (ref 96–106)
Creatinine, Ser: 0.72 mg/dL — ABNORMAL LOW (ref 0.76–1.27)
Globulin, Total: 2.1 g/dL (ref 1.5–4.5)
Glucose: 175 mg/dL — ABNORMAL HIGH (ref 70–99)
Potassium: 4.3 mmol/L (ref 3.5–5.2)
Sodium: 140 mmol/L (ref 134–144)
Total Protein: 6.9 g/dL (ref 6.0–8.5)
eGFR: 121 mL/min/{1.73_m2} (ref >59)

## 2021-07-04 NOTE — Progress Notes (Signed)
MA LVM for RN Phyllis to return a phone call for results. ?

## 2021-07-10 ENCOUNTER — Encounter: Payer: Self-pay | Admitting: *Deleted

## 2021-07-10 NOTE — Progress Notes (Signed)
MA UTR patient due to no longer being at the facility. Patient has no contact information listed in his chart for a communication letter to be mailed out. ?
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
